# Patient Record
Sex: Female | Born: 1940 | Race: White | Hispanic: No | Marital: Married | Smoking: Former smoker
Health system: Southern US, Community
[De-identification: ages and names within clinical notes are randomized; demographics above are authoritative.]

## PROBLEM LIST (undated history)

## (undated) DIAGNOSIS — I5189 Other ill-defined heart diseases: Secondary | ICD-10-CM

## (undated) DIAGNOSIS — R58 Hemorrhage, not elsewhere classified: Secondary | ICD-10-CM

## (undated) DIAGNOSIS — I2699 Other pulmonary embolism without acute cor pulmonale: Secondary | ICD-10-CM

## (undated) DIAGNOSIS — I513 Intracardiac thrombosis, not elsewhere classified: Secondary | ICD-10-CM

## (undated) DIAGNOSIS — S72002A Fracture of unspecified part of neck of left femur, initial encounter for closed fracture: Secondary | ICD-10-CM

## (undated) DIAGNOSIS — I469 Cardiac arrest, cause unspecified: Secondary | ICD-10-CM

## (undated) DIAGNOSIS — N9489 Other specified conditions associated with female genital organs and menstrual cycle: Secondary | ICD-10-CM

## (undated) DIAGNOSIS — J9601 Acute respiratory failure with hypoxia: Secondary | ICD-10-CM

## (undated) DIAGNOSIS — E870 Hyperosmolality and hypernatremia: Secondary | ICD-10-CM

## (undated) HISTORY — PX: IVC FILTER PLACEMENT (ARMC HX): HXRAD1551

## (undated) HISTORY — PX: BACK SURGERY: SHX140

---

## 2000-06-06 ENCOUNTER — Other Ambulatory Visit: Admission: RE | Admit: 2000-06-06 | Discharge: 2000-06-06 | Payer: Self-pay | Admitting: Gynecology

## 2000-08-31 ENCOUNTER — Encounter: Payer: Self-pay | Admitting: *Deleted

## 2000-08-31 ENCOUNTER — Ambulatory Visit (HOSPITAL_COMMUNITY): Admission: RE | Admit: 2000-08-31 | Discharge: 2000-08-31 | Payer: Self-pay | Admitting: *Deleted

## 2001-03-03 ENCOUNTER — Encounter: Admission: RE | Admit: 2001-03-03 | Discharge: 2001-03-03 | Payer: Self-pay | Admitting: Neurosurgery

## 2001-03-03 ENCOUNTER — Encounter: Payer: Self-pay | Admitting: Neurosurgery

## 2001-03-17 ENCOUNTER — Encounter: Admission: RE | Admit: 2001-03-17 | Discharge: 2001-03-17 | Payer: Self-pay | Admitting: Neurosurgery

## 2001-03-17 ENCOUNTER — Encounter: Payer: Self-pay | Admitting: Neurosurgery

## 2001-03-31 ENCOUNTER — Encounter: Admission: RE | Admit: 2001-03-31 | Discharge: 2001-03-31 | Payer: Self-pay | Admitting: Neurosurgery

## 2001-03-31 ENCOUNTER — Encounter: Payer: Self-pay | Admitting: Neurosurgery

## 2001-05-22 ENCOUNTER — Other Ambulatory Visit: Admission: RE | Admit: 2001-05-22 | Discharge: 2001-05-22 | Payer: Self-pay | Admitting: Gynecology

## 2001-07-14 ENCOUNTER — Encounter: Payer: Self-pay | Admitting: Neurosurgery

## 2001-07-18 ENCOUNTER — Encounter: Payer: Self-pay | Admitting: Neurosurgery

## 2001-07-18 ENCOUNTER — Ambulatory Visit (HOSPITAL_COMMUNITY): Admission: RE | Admit: 2001-07-18 | Discharge: 2001-07-19 | Payer: Self-pay | Admitting: Neurosurgery

## 2001-08-22 ENCOUNTER — Encounter: Payer: Self-pay | Admitting: Neurosurgery

## 2001-08-22 ENCOUNTER — Ambulatory Visit (HOSPITAL_COMMUNITY): Admission: RE | Admit: 2001-08-22 | Discharge: 2001-08-22 | Payer: Self-pay | Admitting: Neurosurgery

## 2001-09-12 ENCOUNTER — Encounter: Admission: RE | Admit: 2001-09-12 | Discharge: 2001-09-12 | Payer: Self-pay | Admitting: Neurosurgery

## 2001-09-12 ENCOUNTER — Encounter: Payer: Self-pay | Admitting: Neurosurgery

## 2001-11-13 ENCOUNTER — Encounter: Admission: RE | Admit: 2001-11-13 | Discharge: 2001-11-13 | Payer: Self-pay | Admitting: Neurosurgery

## 2001-11-13 ENCOUNTER — Encounter: Payer: Self-pay | Admitting: Neurosurgery

## 2001-11-27 ENCOUNTER — Encounter: Payer: Self-pay | Admitting: Neurosurgery

## 2001-11-27 ENCOUNTER — Encounter: Admission: RE | Admit: 2001-11-27 | Discharge: 2001-11-27 | Payer: Self-pay | Admitting: Neurosurgery

## 2002-08-20 ENCOUNTER — Inpatient Hospital Stay (HOSPITAL_COMMUNITY): Admission: EM | Admit: 2002-08-20 | Discharge: 2002-08-28 | Payer: Self-pay | Admitting: Emergency Medicine

## 2002-08-20 ENCOUNTER — Encounter: Payer: Self-pay | Admitting: Emergency Medicine

## 2002-08-21 ENCOUNTER — Encounter: Payer: Self-pay | Admitting: Pulmonary Disease

## 2002-08-22 ENCOUNTER — Encounter: Payer: Self-pay | Admitting: Critical Care Medicine

## 2002-08-24 ENCOUNTER — Encounter: Payer: Self-pay | Admitting: Pulmonary Disease

## 2003-12-12 ENCOUNTER — Encounter: Admission: RE | Admit: 2003-12-12 | Discharge: 2003-12-12 | Payer: Self-pay | Admitting: Interventional Radiology

## 2004-01-09 ENCOUNTER — Encounter: Admission: RE | Admit: 2004-01-09 | Discharge: 2004-01-09 | Payer: Self-pay | Admitting: Interventional Radiology

## 2010-11-11 ENCOUNTER — Other Ambulatory Visit: Payer: Self-pay | Admitting: Neurosurgery

## 2010-11-11 DIAGNOSIS — M545 Low back pain, unspecified: Secondary | ICD-10-CM

## 2010-11-14 ENCOUNTER — Ambulatory Visit
Admission: RE | Admit: 2010-11-14 | Discharge: 2010-11-14 | Disposition: A | Discharge status: Home / Self Care | Payer: Self-pay | Source: Ambulatory Visit | Attending: Neurosurgery | Admitting: Neurosurgery

## 2010-11-14 DIAGNOSIS — M545 Low back pain, unspecified: Secondary | ICD-10-CM

## 2010-11-14 MED ORDER — GADOBENATE DIMEGLUMINE 529 MG/ML IV SOLN
12.0000 mL | Freq: Once | INTRAVENOUS | Status: AC | PRN
  Administered 2010-11-14: 12 mL via INTRAVENOUS

## 2010-11-19 ENCOUNTER — Other Ambulatory Visit: Payer: Self-pay | Admitting: Neurosurgery

## 2010-11-19 DIAGNOSIS — M5126 Other intervertebral disc displacement, lumbar region: Secondary | ICD-10-CM

## 2010-11-19 DIAGNOSIS — M5410 Radiculopathy, site unspecified: Secondary | ICD-10-CM

## 2010-11-20 ENCOUNTER — Ambulatory Visit
Admission: RE | Admit: 2010-11-20 | Discharge: 2010-11-20 | Disposition: A | Discharge status: Home / Self Care | Payer: 59 | Source: Ambulatory Visit | Attending: Neurosurgery | Admitting: Neurosurgery

## 2010-11-20 ENCOUNTER — Other Ambulatory Visit: Payer: Self-pay | Admitting: Neurosurgery

## 2010-11-20 DIAGNOSIS — M5410 Radiculopathy, site unspecified: Secondary | ICD-10-CM

## 2010-11-20 DIAGNOSIS — M5126 Other intervertebral disc displacement, lumbar region: Secondary | ICD-10-CM

## 2010-12-31 ENCOUNTER — Other Ambulatory Visit: Payer: Self-pay | Admitting: Neurosurgery

## 2010-12-31 DIAGNOSIS — M549 Dorsalgia, unspecified: Secondary | ICD-10-CM

## 2010-12-31 DIAGNOSIS — M5410 Radiculopathy, site unspecified: Secondary | ICD-10-CM

## 2011-01-01 ENCOUNTER — Ambulatory Visit
Admission: RE | Admit: 2011-01-01 | Discharge: 2011-01-01 | Disposition: A | Discharge status: Home / Self Care | Payer: 59 | Source: Ambulatory Visit | Attending: Neurosurgery | Admitting: Neurosurgery

## 2011-01-01 DIAGNOSIS — M5410 Radiculopathy, site unspecified: Secondary | ICD-10-CM

## 2011-01-01 DIAGNOSIS — M549 Dorsalgia, unspecified: Secondary | ICD-10-CM

## 2011-11-08 ENCOUNTER — Encounter (INDEPENDENT_AMBULATORY_CARE_PROVIDER_SITE_OTHER): Payer: PRIVATE HEALTH INSURANCE | Admitting: Ophthalmology

## 2011-11-08 DIAGNOSIS — H2510 Age-related nuclear cataract, unspecified eye: Secondary | ICD-10-CM

## 2011-11-08 DIAGNOSIS — H40019 Open angle with borderline findings, low risk, unspecified eye: Secondary | ICD-10-CM

## 2011-11-08 DIAGNOSIS — H43819 Vitreous degeneration, unspecified eye: Secondary | ICD-10-CM

## 2012-11-08 ENCOUNTER — Ambulatory Visit (INDEPENDENT_AMBULATORY_CARE_PROVIDER_SITE_OTHER): Payer: PRIVATE HEALTH INSURANCE | Admitting: Ophthalmology

## 2014-04-16 ENCOUNTER — Other Ambulatory Visit: Payer: Self-pay | Admitting: Family Medicine

## 2014-04-16 DIAGNOSIS — M545 Low back pain, unspecified: Secondary | ICD-10-CM

## 2014-04-16 DIAGNOSIS — R2689 Other abnormalities of gait and mobility: Secondary | ICD-10-CM

## 2014-04-17 ENCOUNTER — Ambulatory Visit
Admission: RE | Admit: 2014-04-17 | Discharge: 2014-04-17 | Disposition: A | Discharge status: Home / Self Care | Payer: Medicare HMO | Source: Ambulatory Visit | Attending: Family Medicine | Admitting: Family Medicine

## 2014-04-17 DIAGNOSIS — R2689 Other abnormalities of gait and mobility: Secondary | ICD-10-CM

## 2014-04-17 DIAGNOSIS — M545 Low back pain, unspecified: Secondary | ICD-10-CM

## 2014-05-13 ENCOUNTER — Other Ambulatory Visit: Payer: Self-pay | Admitting: Neurosurgery

## 2014-05-13 DIAGNOSIS — M5416 Radiculopathy, lumbar region: Secondary | ICD-10-CM

## 2014-05-31 ENCOUNTER — Ambulatory Visit
Admission: RE | Admit: 2014-05-31 | Discharge: 2014-05-31 | Disposition: A | Discharge status: Home / Self Care | Payer: Medicare HMO | Source: Ambulatory Visit | Attending: Neurosurgery | Admitting: Neurosurgery

## 2014-05-31 DIAGNOSIS — M5416 Radiculopathy, lumbar region: Secondary | ICD-10-CM

## 2014-05-31 MED ORDER — DIAZEPAM 5 MG PO TABS
5.0000 mg | ORAL_TABLET | Freq: Once | ORAL | Status: AC
  Administered 2014-05-31: 5 mg via ORAL

## 2014-05-31 MED ORDER — IOHEXOL 180 MG/ML  SOLN
18.0000 mL | Freq: Once | INTRAMUSCULAR | Status: AC | PRN
  Administered 2014-05-31: 18 mL via INTRATHECAL

## 2014-05-31 NOTE — Discharge Instructions (Signed)

## 2015-08-18 DIAGNOSIS — H04123 Dry eye syndrome of bilateral lacrimal glands: Secondary | ICD-10-CM | POA: Diagnosis not present

## 2015-08-18 DIAGNOSIS — H25013 Cortical age-related cataract, bilateral: Secondary | ICD-10-CM | POA: Diagnosis not present

## 2015-08-18 DIAGNOSIS — H2513 Age-related nuclear cataract, bilateral: Secondary | ICD-10-CM | POA: Diagnosis not present

## 2015-10-21 DIAGNOSIS — H04123 Dry eye syndrome of bilateral lacrimal glands: Secondary | ICD-10-CM | POA: Diagnosis not present

## 2015-10-21 DIAGNOSIS — H2513 Age-related nuclear cataract, bilateral: Secondary | ICD-10-CM | POA: Diagnosis not present

## 2015-10-22 DIAGNOSIS — L821 Other seborrheic keratosis: Secondary | ICD-10-CM | POA: Diagnosis not present

## 2015-10-22 DIAGNOSIS — Z411 Encounter for cosmetic surgery: Secondary | ICD-10-CM | POA: Diagnosis not present

## 2015-10-22 DIAGNOSIS — L820 Inflamed seborrheic keratosis: Secondary | ICD-10-CM | POA: Diagnosis not present

## 2015-10-22 DIAGNOSIS — I781 Nevus, non-neoplastic: Secondary | ICD-10-CM | POA: Diagnosis not present

## 2015-10-22 DIAGNOSIS — L570 Actinic keratosis: Secondary | ICD-10-CM | POA: Diagnosis not present

## 2015-10-22 DIAGNOSIS — D485 Neoplasm of uncertain behavior of skin: Secondary | ICD-10-CM | POA: Diagnosis not present

## 2015-10-22 DIAGNOSIS — Z85828 Personal history of other malignant neoplasm of skin: Secondary | ICD-10-CM | POA: Diagnosis not present

## 2015-10-22 DIAGNOSIS — L723 Sebaceous cyst: Secondary | ICD-10-CM | POA: Diagnosis not present

## 2015-10-22 DIAGNOSIS — D225 Melanocytic nevi of trunk: Secondary | ICD-10-CM | POA: Diagnosis not present

## 2015-11-01 DIAGNOSIS — J069 Acute upper respiratory infection, unspecified: Secondary | ICD-10-CM | POA: Diagnosis not present

## 2015-12-11 DIAGNOSIS — I469 Cardiac arrest, cause unspecified: Secondary | ICD-10-CM

## 2015-12-11 HISTORY — DX: Cardiac arrest, cause unspecified: I46.9

## 2015-12-25 ENCOUNTER — Emergency Department (HOSPITAL_COMMUNITY): Payer: Medicare HMO

## 2015-12-25 ENCOUNTER — Encounter (HOSPITAL_COMMUNITY): Payer: Self-pay | Admitting: Emergency Medicine

## 2015-12-25 ENCOUNTER — Inpatient Hospital Stay (HOSPITAL_COMMUNITY)
Admission: EM | Admit: 2015-12-25 | Discharge: 2016-01-16 | DRG: 469 | Disposition: A | Discharge status: Rehabilitation Facility | Payer: Medicare HMO | Attending: Family Medicine | Admitting: Family Medicine

## 2015-12-25 DIAGNOSIS — S72009A Fracture of unspecified part of neck of unspecified femur, initial encounter for closed fracture: Secondary | ICD-10-CM | POA: Diagnosis present

## 2015-12-25 DIAGNOSIS — T148 Other injury of unspecified body region: Secondary | ICD-10-CM | POA: Diagnosis not present

## 2015-12-25 DIAGNOSIS — I469 Cardiac arrest, cause unspecified: Secondary | ICD-10-CM | POA: Diagnosis present

## 2015-12-25 DIAGNOSIS — Z95828 Presence of other vascular implants and grafts: Secondary | ICD-10-CM | POA: Diagnosis not present

## 2015-12-25 DIAGNOSIS — W19XXXA Unspecified fall, initial encounter: Secondary | ICD-10-CM | POA: Diagnosis not present

## 2015-12-25 DIAGNOSIS — R9341 Abnormal radiologic findings on diagnostic imaging of renal pelvis, ureter, or bladder: Secondary | ICD-10-CM | POA: Diagnosis not present

## 2015-12-25 DIAGNOSIS — Z823 Family history of stroke: Secondary | ICD-10-CM

## 2015-12-25 DIAGNOSIS — I248 Other forms of acute ischemic heart disease: Secondary | ICD-10-CM | POA: Diagnosis not present

## 2015-12-25 DIAGNOSIS — Z978 Presence of other specified devices: Secondary | ICD-10-CM

## 2015-12-25 DIAGNOSIS — N390 Urinary tract infection, site not specified: Secondary | ICD-10-CM | POA: Diagnosis not present

## 2015-12-25 DIAGNOSIS — M545 Low back pain: Secondary | ICD-10-CM | POA: Diagnosis not present

## 2015-12-25 DIAGNOSIS — I2699 Other pulmonary embolism without acute cor pulmonale: Secondary | ICD-10-CM | POA: Diagnosis not present

## 2015-12-25 DIAGNOSIS — J9600 Acute respiratory failure, unspecified whether with hypoxia or hypercapnia: Secondary | ICD-10-CM | POA: Diagnosis present

## 2015-12-25 DIAGNOSIS — K921 Melena: Secondary | ICD-10-CM | POA: Diagnosis not present

## 2015-12-25 DIAGNOSIS — Z9289 Personal history of other medical treatment: Secondary | ICD-10-CM

## 2015-12-25 DIAGNOSIS — S72092A Other fracture of head and neck of left femur, initial encounter for closed fracture: Secondary | ICD-10-CM | POA: Diagnosis not present

## 2015-12-25 DIAGNOSIS — R52 Pain, unspecified: Secondary | ICD-10-CM | POA: Diagnosis not present

## 2015-12-25 DIAGNOSIS — R222 Localized swelling, mass and lump, trunk: Secondary | ICD-10-CM | POA: Diagnosis not present

## 2015-12-25 DIAGNOSIS — E870 Hyperosmolality and hypernatremia: Secondary | ICD-10-CM | POA: Diagnosis not present

## 2015-12-25 DIAGNOSIS — I97631 Postprocedural hematoma of a circulatory system organ or structure following cardiac bypass: Secondary | ICD-10-CM | POA: Diagnosis not present

## 2015-12-25 DIAGNOSIS — Y92009 Unspecified place in unspecified non-institutional (private) residence as the place of occurrence of the external cause: Secondary | ICD-10-CM

## 2015-12-25 DIAGNOSIS — I745 Embolism and thrombosis of iliac artery: Secondary | ICD-10-CM | POA: Diagnosis not present

## 2015-12-25 DIAGNOSIS — K7200 Acute and subacute hepatic failure without coma: Secondary | ICD-10-CM | POA: Diagnosis not present

## 2015-12-25 DIAGNOSIS — R918 Other nonspecific abnormal finding of lung field: Secondary | ICD-10-CM | POA: Diagnosis not present

## 2015-12-25 DIAGNOSIS — J9601 Acute respiratory failure with hypoxia: Secondary | ICD-10-CM | POA: Diagnosis not present

## 2015-12-25 DIAGNOSIS — I724 Aneurysm of artery of lower extremity: Secondary | ICD-10-CM | POA: Diagnosis not present

## 2015-12-25 DIAGNOSIS — R140 Abdominal distension (gaseous): Secondary | ICD-10-CM | POA: Diagnosis not present

## 2015-12-25 DIAGNOSIS — I9581 Postprocedural hypotension: Secondary | ICD-10-CM | POA: Diagnosis not present

## 2015-12-25 DIAGNOSIS — I82419 Acute embolism and thrombosis of unspecified femoral vein: Secondary | ICD-10-CM | POA: Diagnosis not present

## 2015-12-25 DIAGNOSIS — D329 Benign neoplasm of meninges, unspecified: Secondary | ICD-10-CM | POA: Diagnosis not present

## 2015-12-25 DIAGNOSIS — W010XXA Fall on same level from slipping, tripping and stumbling without subsequent striking against object, initial encounter: Secondary | ICD-10-CM | POA: Diagnosis not present

## 2015-12-25 DIAGNOSIS — Z87891 Personal history of nicotine dependence: Secondary | ICD-10-CM

## 2015-12-25 DIAGNOSIS — I471 Supraventricular tachycardia: Secondary | ICD-10-CM | POA: Diagnosis not present

## 2015-12-25 DIAGNOSIS — N9489 Other specified conditions associated with female genital organs and menstrual cycle: Secondary | ICD-10-CM

## 2015-12-25 DIAGNOSIS — I8290 Acute embolism and thrombosis of unspecified vein: Secondary | ICD-10-CM

## 2015-12-25 DIAGNOSIS — I9762 Postprocedural hemorrhage of a circulatory system organ or structure following other procedure: Secondary | ICD-10-CM | POA: Diagnosis not present

## 2015-12-25 DIAGNOSIS — I340 Nonrheumatic mitral (valve) insufficiency: Secondary | ICD-10-CM | POA: Diagnosis not present

## 2015-12-25 DIAGNOSIS — Z8674 Personal history of sudden cardiac arrest: Secondary | ICD-10-CM | POA: Diagnosis not present

## 2015-12-25 DIAGNOSIS — R1900 Intra-abdominal and pelvic swelling, mass and lump, unspecified site: Secondary | ICD-10-CM | POA: Diagnosis not present

## 2015-12-25 DIAGNOSIS — Z96642 Presence of left artificial hip joint: Secondary | ICD-10-CM | POA: Diagnosis not present

## 2015-12-25 DIAGNOSIS — R579 Shock, unspecified: Secondary | ICD-10-CM | POA: Diagnosis not present

## 2015-12-25 DIAGNOSIS — I82413 Acute embolism and thrombosis of femoral vein, bilateral: Secondary | ICD-10-CM | POA: Diagnosis not present

## 2015-12-25 DIAGNOSIS — J9690 Respiratory failure, unspecified, unspecified whether with hypoxia or hypercapnia: Secondary | ICD-10-CM | POA: Diagnosis not present

## 2015-12-25 DIAGNOSIS — S72012A Unspecified intracapsular fracture of left femur, initial encounter for closed fracture: Principal | ICD-10-CM | POA: Diagnosis present

## 2015-12-25 DIAGNOSIS — Y793 Surgical instruments, materials and orthopedic devices (including sutures) associated with adverse incidents: Secondary | ICD-10-CM | POA: Diagnosis not present

## 2015-12-25 DIAGNOSIS — T781XXA Other adverse food reactions, not elsewhere classified, initial encounter: Secondary | ICD-10-CM | POA: Diagnosis not present

## 2015-12-25 DIAGNOSIS — R1312 Dysphagia, oropharyngeal phase: Secondary | ICD-10-CM | POA: Diagnosis not present

## 2015-12-25 DIAGNOSIS — R601 Generalized edema: Secondary | ICD-10-CM | POA: Diagnosis not present

## 2015-12-25 DIAGNOSIS — R578 Other shock: Secondary | ICD-10-CM | POA: Diagnosis present

## 2015-12-25 DIAGNOSIS — Z6820 Body mass index (BMI) 20.0-20.9, adult: Secondary | ICD-10-CM

## 2015-12-25 DIAGNOSIS — R5381 Other malaise: Secondary | ICD-10-CM | POA: Diagnosis not present

## 2015-12-25 DIAGNOSIS — I2692 Saddle embolus of pulmonary artery without acute cor pulmonale: Secondary | ICD-10-CM | POA: Diagnosis not present

## 2015-12-25 DIAGNOSIS — I462 Cardiac arrest due to underlying cardiac condition: Secondary | ICD-10-CM | POA: Diagnosis not present

## 2015-12-25 DIAGNOSIS — Y92234 Operating room of hospital as the place of occurrence of the external cause: Secondary | ICD-10-CM | POA: Diagnosis not present

## 2015-12-25 DIAGNOSIS — K922 Gastrointestinal hemorrhage, unspecified: Secondary | ICD-10-CM | POA: Diagnosis not present

## 2015-12-25 DIAGNOSIS — I723 Aneurysm of iliac artery: Secondary | ICD-10-CM | POA: Diagnosis present

## 2015-12-25 DIAGNOSIS — Z471 Aftercare following joint replacement surgery: Secondary | ICD-10-CM | POA: Diagnosis not present

## 2015-12-25 DIAGNOSIS — S72002A Fracture of unspecified part of neck of left femur, initial encounter for closed fracture: Secondary | ICD-10-CM | POA: Diagnosis not present

## 2015-12-25 DIAGNOSIS — D696 Thrombocytopenia, unspecified: Secondary | ICD-10-CM | POA: Diagnosis present

## 2015-12-25 DIAGNOSIS — I480 Paroxysmal atrial fibrillation: Secondary | ICD-10-CM | POA: Diagnosis not present

## 2015-12-25 DIAGNOSIS — I9752 Accidental puncture and laceration of a circulatory system organ or structure during other procedure: Secondary | ICD-10-CM | POA: Diagnosis not present

## 2015-12-25 DIAGNOSIS — D62 Acute posthemorrhagic anemia: Secondary | ICD-10-CM | POA: Diagnosis not present

## 2015-12-25 DIAGNOSIS — I341 Nonrheumatic mitral (valve) prolapse: Secondary | ICD-10-CM | POA: Diagnosis not present

## 2015-12-25 DIAGNOSIS — I493 Ventricular premature depolarization: Secondary | ICD-10-CM | POA: Diagnosis not present

## 2015-12-25 DIAGNOSIS — Y658 Other specified misadventures during surgical and medical care: Secondary | ICD-10-CM | POA: Diagnosis not present

## 2015-12-25 DIAGNOSIS — T888XXA Other specified complications of surgical and medical care, not elsewhere classified, initial encounter: Secondary | ICD-10-CM

## 2015-12-25 DIAGNOSIS — I5189 Other ill-defined heart diseases: Secondary | ICD-10-CM | POA: Diagnosis present

## 2015-12-25 DIAGNOSIS — R269 Unspecified abnormalities of gait and mobility: Secondary | ICD-10-CM | POA: Diagnosis not present

## 2015-12-25 DIAGNOSIS — J811 Chronic pulmonary edema: Secondary | ICD-10-CM | POA: Diagnosis not present

## 2015-12-25 DIAGNOSIS — R609 Edema, unspecified: Secondary | ICD-10-CM | POA: Diagnosis not present

## 2015-12-25 DIAGNOSIS — R4701 Aphasia: Secondary | ICD-10-CM | POA: Diagnosis not present

## 2015-12-25 DIAGNOSIS — R402134 Coma scale, eyes open, to sound, 24 hours or more after hospital admission: Secondary | ICD-10-CM | POA: Diagnosis not present

## 2015-12-25 DIAGNOSIS — K661 Hemoperitoneum: Secondary | ICD-10-CM | POA: Diagnosis not present

## 2015-12-25 DIAGNOSIS — R1310 Dysphagia, unspecified: Secondary | ICD-10-CM | POA: Diagnosis not present

## 2015-12-25 DIAGNOSIS — Z807 Family history of other malignant neoplasms of lymphoid, hematopoietic and related tissues: Secondary | ICD-10-CM

## 2015-12-25 DIAGNOSIS — K625 Hemorrhage of anus and rectum: Secondary | ICD-10-CM | POA: Diagnosis present

## 2015-12-25 DIAGNOSIS — S79912A Unspecified injury of left hip, initial encounter: Secondary | ICD-10-CM | POA: Diagnosis not present

## 2015-12-25 DIAGNOSIS — R571 Hypovolemic shock: Secondary | ICD-10-CM | POA: Diagnosis present

## 2015-12-25 DIAGNOSIS — J9811 Atelectasis: Secondary | ICD-10-CM | POA: Diagnosis not present

## 2015-12-25 DIAGNOSIS — G8918 Other acute postprocedural pain: Secondary | ICD-10-CM | POA: Diagnosis not present

## 2015-12-25 DIAGNOSIS — I213 ST elevation (STEMI) myocardial infarction of unspecified site: Secondary | ICD-10-CM | POA: Diagnosis not present

## 2015-12-25 DIAGNOSIS — Z8249 Family history of ischemic heart disease and other diseases of the circulatory system: Secondary | ICD-10-CM

## 2015-12-25 DIAGNOSIS — E872 Acidosis: Secondary | ICD-10-CM | POA: Diagnosis not present

## 2015-12-25 DIAGNOSIS — I2601 Septic pulmonary embolism with acute cor pulmonale: Secondary | ICD-10-CM | POA: Diagnosis not present

## 2015-12-25 DIAGNOSIS — T81718A Complication of other artery following a procedure, not elsewhere classified, initial encounter: Secondary | ICD-10-CM | POA: Diagnosis not present

## 2015-12-25 DIAGNOSIS — E46 Unspecified protein-calorie malnutrition: Secondary | ICD-10-CM | POA: Diagnosis present

## 2015-12-25 DIAGNOSIS — I82403 Acute embolism and thrombosis of unspecified deep veins of lower extremity, bilateral: Secondary | ICD-10-CM | POA: Diagnosis not present

## 2015-12-25 DIAGNOSIS — R58 Hemorrhage, not elsewhere classified: Secondary | ICD-10-CM

## 2015-12-25 DIAGNOSIS — R402354 Coma scale, best motor response, localizes pain, 24 hours or more after hospital admission: Secondary | ICD-10-CM | POA: Diagnosis not present

## 2015-12-25 DIAGNOSIS — R69 Illness, unspecified: Secondary | ICD-10-CM | POA: Diagnosis not present

## 2015-12-25 DIAGNOSIS — I513 Intracardiac thrombosis, not elsewhere classified: Secondary | ICD-10-CM | POA: Diagnosis present

## 2015-12-25 DIAGNOSIS — Z419 Encounter for procedure for purposes other than remedying health state, unspecified: Secondary | ICD-10-CM

## 2015-12-25 DIAGNOSIS — S72002S Fracture of unspecified part of neck of left femur, sequela: Secondary | ICD-10-CM | POA: Diagnosis not present

## 2015-12-25 DIAGNOSIS — Y92099 Unspecified place in other non-institutional residence as the place of occurrence of the external cause: Secondary | ICD-10-CM | POA: Diagnosis not present

## 2015-12-25 DIAGNOSIS — I871 Compression of vein: Secondary | ICD-10-CM | POA: Diagnosis not present

## 2015-12-25 DIAGNOSIS — Z96641 Presence of right artificial hip joint: Secondary | ICD-10-CM | POA: Diagnosis not present

## 2015-12-25 DIAGNOSIS — R402234 Coma scale, best verbal response, inappropriate words, 24 hours or more after hospital admission: Secondary | ICD-10-CM | POA: Diagnosis not present

## 2015-12-25 DIAGNOSIS — S3792XA Contusion of unspecified urinary and pelvic organ, initial encounter: Secondary | ICD-10-CM | POA: Diagnosis not present

## 2015-12-25 DIAGNOSIS — E860 Dehydration: Secondary | ICD-10-CM | POA: Diagnosis not present

## 2015-12-25 DIAGNOSIS — M25552 Pain in left hip: Secondary | ICD-10-CM | POA: Diagnosis not present

## 2015-12-25 DIAGNOSIS — R197 Diarrhea, unspecified: Secondary | ICD-10-CM | POA: Diagnosis not present

## 2015-12-25 DIAGNOSIS — F329 Major depressive disorder, single episode, unspecified: Secondary | ICD-10-CM | POA: Diagnosis not present

## 2015-12-25 DIAGNOSIS — Z4682 Encounter for fitting and adjustment of non-vascular catheter: Secondary | ICD-10-CM | POA: Diagnosis not present

## 2015-12-25 DIAGNOSIS — E876 Hypokalemia: Secondary | ICD-10-CM | POA: Diagnosis present

## 2015-12-25 DIAGNOSIS — F419 Anxiety disorder, unspecified: Secondary | ICD-10-CM | POA: Diagnosis not present

## 2015-12-25 DIAGNOSIS — R570 Cardiogenic shock: Secondary | ICD-10-CM | POA: Diagnosis not present

## 2015-12-25 DIAGNOSIS — R339 Retention of urine, unspecified: Secondary | ICD-10-CM | POA: Diagnosis not present

## 2015-12-25 DIAGNOSIS — D72829 Elevated white blood cell count, unspecified: Secondary | ICD-10-CM | POA: Diagnosis present

## 2015-12-25 DIAGNOSIS — Z4659 Encounter for fitting and adjustment of other gastrointestinal appliance and device: Secondary | ICD-10-CM

## 2015-12-25 DIAGNOSIS — G9340 Encephalopathy, unspecified: Secondary | ICD-10-CM | POA: Diagnosis not present

## 2015-12-25 DIAGNOSIS — Z8781 Personal history of (healed) traumatic fracture: Secondary | ICD-10-CM | POA: Diagnosis not present

## 2015-12-25 DIAGNOSIS — Z86711 Personal history of pulmonary embolism: Secondary | ICD-10-CM | POA: Diagnosis not present

## 2015-12-25 DIAGNOSIS — R0602 Shortness of breath: Secondary | ICD-10-CM | POA: Diagnosis not present

## 2015-12-25 DIAGNOSIS — J90 Pleural effusion, not elsewhere classified: Secondary | ICD-10-CM | POA: Diagnosis not present

## 2015-12-25 DIAGNOSIS — I2609 Other pulmonary embolism with acute cor pulmonale: Secondary | ICD-10-CM | POA: Diagnosis not present

## 2015-12-25 HISTORY — DX: Other ill-defined heart diseases: I51.89

## 2015-12-25 HISTORY — DX: Other specified conditions associated with female genital organs and menstrual cycle: N94.89

## 2015-12-25 HISTORY — DX: Acute respiratory failure with hypoxia: J96.01

## 2015-12-25 HISTORY — DX: Other pulmonary embolism without acute cor pulmonale: I26.99

## 2015-12-25 HISTORY — DX: Intracardiac thrombosis, not elsewhere classified: I51.3

## 2015-12-25 HISTORY — DX: Hemorrhage, not elsewhere classified: R58

## 2015-12-25 HISTORY — DX: Cardiac arrest, cause unspecified: I46.9

## 2015-12-25 HISTORY — DX: Fracture of unspecified part of neck of left femur, initial encounter for closed fracture: S72.002A

## 2015-12-25 HISTORY — DX: Hyperosmolality and hypernatremia: E87.0

## 2015-12-25 LAB — CBC WITH DIFFERENTIAL/PLATELET
BASOS PCT: 0 %
Basophils Absolute: 0 10*3/uL (ref 0.0–0.1)
EOS ABS: 0 10*3/uL (ref 0.0–0.7)
Eosinophils Relative: 0 %
HCT: 35.3 % — ABNORMAL LOW (ref 36.0–46.0)
HEMOGLOBIN: 12 g/dL (ref 12.0–15.0)
Lymphocytes Relative: 9 %
Lymphs Abs: 1.1 10*3/uL (ref 0.7–4.0)
MCH: 28.9 pg (ref 26.0–34.0)
MCHC: 34 g/dL (ref 30.0–36.0)
MCV: 85.1 fL (ref 78.0–100.0)
Monocytes Absolute: 0.7 10*3/uL (ref 0.1–1.0)
Monocytes Relative: 6 %
NEUTROS PCT: 85 %
Neutro Abs: 10.8 10*3/uL — ABNORMAL HIGH (ref 1.7–7.7)
Platelets: 246 10*3/uL (ref 150–400)
RBC: 4.15 MIL/uL (ref 3.87–5.11)
RDW: 13.1 % (ref 11.5–15.5)
WBC: 12.6 10*3/uL — AB (ref 4.0–10.5)

## 2015-12-25 LAB — BASIC METABOLIC PANEL
ANION GAP: 8 (ref 5–15)
BUN: 21 mg/dL — ABNORMAL HIGH (ref 6–20)
CALCIUM: 9.2 mg/dL (ref 8.9–10.3)
CO2: 23 mmol/L (ref 22–32)
CREATININE: 0.76 mg/dL (ref 0.44–1.00)
Chloride: 101 mmol/L (ref 101–111)
GFR calc Af Amer: >60 mL/min (ref >60)
GFR calc non Af Amer: >60 mL/min (ref >60)
GLUCOSE: 143 mg/dL — AB (ref 65–99)
POTASSIUM: 4.3 mmol/L (ref 3.5–5.1)
Sodium: 132 mmol/L — ABNORMAL LOW (ref 135–145)

## 2015-12-25 LAB — URINALYSIS, ROUTINE W REFLEX MICROSCOPIC
Bilirubin Urine: NEGATIVE
GLUCOSE, UA: NEGATIVE mg/dL
Ketones, ur: >80 mg/dL — AB
Nitrite: NEGATIVE
PH: 6.5 (ref 5.0–8.0)
PROTEIN: NEGATIVE mg/dL
Specific Gravity, Urine: 1.016 (ref 1.005–1.030)

## 2015-12-25 LAB — ABO/RH: ABO/RH(D): B POS

## 2015-12-25 LAB — URINE MICROSCOPIC-ADD ON

## 2015-12-25 LAB — TYPE AND SCREEN
ABO/RH(D): B POS
ANTIBODY SCREEN: NEGATIVE

## 2015-12-25 LAB — PROTIME-INR
INR: 1.13 (ref 0.00–1.49)
PROTHROMBIN TIME: 14.3 s (ref 11.6–15.2)

## 2015-12-25 MED ORDER — KETOROLAC TROMETHAMINE 30 MG/ML IJ SOLN
30.0000 mg | Freq: Once | INTRAMUSCULAR | Status: AC
  Administered 2015-12-25: 30 mg via INTRAVENOUS
  Filled 2015-12-25: qty 1

## 2015-12-25 MED ORDER — MORPHINE SULFATE (PF) 4 MG/ML IV SOLN
4.0000 mg | Freq: Once | INTRAVENOUS | Status: AC
  Administered 2015-12-25: 4 mg via INTRAVENOUS
  Filled 2015-12-25: qty 1

## 2015-12-25 MED ORDER — HYDROMORPHONE HCL 1 MG/ML IJ SOLN
1.0000 mg | Freq: Once | INTRAMUSCULAR | Status: AC
  Administered 2015-12-25: 1 mg via INTRAVENOUS
  Filled 2015-12-25: qty 1

## 2015-12-25 NOTE — ED Notes (Signed)
Per EMS pt fell at home after tripping over a open drawer  Pt landed on her left hip  Pt went to her PCP and had imaging done that showed a femoral hip fx  Pt denies hitting her head, no LOC, and pt is not on any blood thinners  Pt denies any med problems, medications, or allergies

## 2015-12-25 NOTE — ED Provider Notes (Signed)
CSN: [id number]     Arrival date & time 12/25/15  1913 History   First MD Initiated Contact with Patient 12/25/15 1923     Chief Complaint  Patient presents with  . Hip Injury   PT IS A 75 YO WF WHO SAID THAT SHE FELL AT HOME AND LANDED ON HER LEFT HIP.  SHE SAID SHE TRIPPED OVER AN OPEN DRAWER.  SHE INITIALLY WENT TO URGENT CARE WHO DID XRAYS AND TOLD HER THAT SHE HAD A HIP FRACTURE.  THEY CALLED EMS AND SENT HER HERE.  PT DENIES ANY OTHER INJURY.  (Consider location/radiation/quality/duration/timing/severity/associated sxs/prior Treatment) The history is provided by the patient.    History reviewed. No pertinent past medical history. Past Surgical History  Procedure Laterality Date  . Back surgery     History reviewed. No pertinent family history. Social History  Substance Use Topics  . Smoking status: Former Research scientist (life sciences)  . Smokeless tobacco: Former Systems developer    Quit date: 12/11/1995  . Alcohol Use: No   OB History    No data available     Review of Systems  Musculoskeletal:       LEFT HIP PAIN  All other systems reviewed and are negative.     Allergies  Review of patient's allergies indicates no known allergies.  Home Medications   Prior to Admission medications   Medication Sig Start Date End Date Taking? Authorizing Provider  CALCIUM PO Take 2 tablets by mouth daily.   Yes Historical Provider, MD  chlorhexidine (PERIDEX) 0.12 % solution 15 mLs by Mouth Rinse route 2 (two) times daily. 12/22/15  Yes Historical Provider, MD  Cholecalciferol (VITAMIN D PO) Take 1 capsule by mouth daily.   Yes Historical Provider, MD  fluticasone (FLONASE) 50 MCG/ACT nasal spray Place 2 sprays into the nose daily as needed for allergies.  11/01/15  Yes Historical Provider, MD  GINKGO BILOBA PO Take 1 capsule by mouth daily.   Yes Historical Provider, MD  Multiple Vitamin (MULTIVITAMIN WITH MINERALS) TABS tablet Take 1 tablet by mouth daily.   Yes Historical Provider, MD  Multiple  Vitamins-Minerals (ICAPS AREDS 2) CAPS Take 1 capsule by mouth 2 (two) times daily.   Yes Historical Provider, MD   BP 145/98 mmHg  Pulse 94  Temp(Src) 97.8 F (36.6 C) (Oral)  Resp 19  SpO2 95% Physical Exam  Constitutional: She is oriented to person, place, and time. She appears well-developed and well-nourished.  HENT:  Head: Normocephalic and atraumatic.  Right Ear: External ear normal.  Left Ear: External ear normal.  Nose: Nose normal.  Mouth/Throat: Oropharynx is clear and moist.  Eyes: Conjunctivae and EOM are normal. Pupils are equal, round, and reactive to light.  Neck: Normal range of motion. Neck supple.  Cardiovascular: Normal rate, regular rhythm, normal heart sounds and intact distal pulses.   Pulmonary/Chest: Effort normal and breath sounds normal.  Abdominal: Soft. Bowel sounds are normal.  Musculoskeletal:       Left hip: She exhibits tenderness and deformity.  Neurological: She is alert and oriented to person, place, and time.  Skin: Skin is warm and dry.  Psychiatric: She has a normal mood and affect. Her behavior is normal. Judgment and thought content normal.  Nursing note and vitals reviewed.   ED Course  Procedures (including critical care time) Labs Review Labs Reviewed  BASIC METABOLIC PANEL - Abnormal; Notable for the following:    Sodium 132 (*)    Glucose, Bld 143 (*)    BUN  21 (*)    All other components within normal limits  CBC WITH DIFFERENTIAL/PLATELET - Abnormal; Notable for the following:    WBC 12.6 (*)    HCT 35.3 (*)    Neutro Abs 10.8 (*)    All other components within normal limits  URINALYSIS, ROUTINE W REFLEX MICROSCOPIC (NOT AT [facility]) - Abnormal; Notable for the following:    APPearance CLOUDY (*)    Hgb urine dipstick SMALL (*)    Ketones, ur >80 (*)    Leukocytes, UA SMALL (*)    All other components within normal limits  URINE MICROSCOPIC-ADD ON - Abnormal; Notable for the following:    Squamous Epithelial / LPF 6-30 (*)     Bacteria, UA MANY (*)    All other components within normal limits  PROTIME-INR  TYPE AND SCREEN  ABO/RH    Imaging Review Dg Chest 1 View  12/25/2015  CLINICAL DATA:  Recent fall today with left hip pain, initial encounter EXAM: CHEST 1 VIEW COMPARISON:  None. FINDINGS: Cardiac shadow is within normal limits. The lungs are well aerated bilaterally. Mild interstitial changes are seen without focal infiltrate. No acute bony abnormality is seen. IMPRESSION: Mild interstitial changes likely of a chronic nature. No acute abnormality seen. Electronically Signed   By: Inez Catalina M.D.   On: 12/25/2015 21:17   Dg Lumbar Spine Complete  12/25/2015  CLINICAL DATA:  Fall today with low back pain, initial encounter EXAM: LUMBAR SPINE - COMPLETE 4+ VIEW COMPARISON:  None. FINDINGS: Five lumbar type vertebral bodies are well visualized. A mild scoliosis concave to the right is noted. Reactive endplate changes are noted. No anterolisthesis is seen. No soft tissue abnormality is noted. IMPRESSION: Degenerative change without acute abnormality. Electronically Signed   By: Inez Catalina M.D.   On: 12/25/2015 21:17   Dg Hip Unilat With Pelvis 2-3 Views Left  12/25/2015  CLINICAL DATA:  Recent fall with hip pain, initial encounter EXAM: DG HIP (WITH OR WITHOUT PELVIS) 2-3V LEFT COMPARISON:  None. FINDINGS: There is left femoral neck fracture with impaction and angulation at the fracture site. Pelvic ring is intact. No other focal abnormality is seen. IMPRESSION: Left femoral neck fracture. Electronically Signed   By: Inez Catalina M.D.   On: 12/25/2015 21:16   Dg Femur 1v Left  12/25/2015  CLINICAL DATA:  [place] [address] with left hip pain. EXAM: LEFT FEMUR 1 VIEW COMPARISON:  Left hip 12/25/2015 FINDINGS: Single view of the left femur was obtained. There is a fracture involving the proximal left femur at the junction of the femoral head and neck. There is superior displacement of the left femoral neck. Findings are  suggestive for a subcapital hip fracture. The mid and distal femur appear to be intact on this single view. IMPRESSION: Fracture of the proximal left femur. Findings are compatible with a subcapital hip fracture. Electronically Signed   By: Markus Daft M.D.   On: 12/25/2015 21:17   I have personally reviewed and evaluated these images and lab results as part of my medical decision-making.   EKG Interpretation None      MDM  PT D/W DR. Percell Miller (ORTHO).  HE REQUESTED THAT PT BE TRANSFERRED TO [place].  THE PT D/W DR. Abner Greenspan (HOSPITALIST) WHO WILL DO HER ADMISSION.  DR. Blaine Hamper ([facility]) WILL BE THE ACCEPTING DR AT [facility].  Final diagnoses:  Femoral neck fracture, left, closed, initial encounter        Isla Pence, MD 12/29/15 [phone]

## 2015-12-25 NOTE — ED Notes (Signed)
[facility] notified of need to transport to [facility] for admission, states at least 2 hour delay for transport

## 2015-12-25 NOTE — H&P (Signed)
History and Physical    Bonnie Salazar [id number] DOB: 05/14/1941 DOA: 12/25/2015  PCP: [name]   Patient coming from: Home  Chief Complaint: Left hip pain  HPI: Bonnie Salazar is a 75 y.o. woman without significant past medical history who feels that she was in her baseline state of health until she sustained an accidental fall at home.  She tripped on a open drawer, causing her fall.  She subsequently developed left hip pain, 10 out of 10 in intensity with any movement.  No LOC.  No chest pain or shortness of breath.  No swelling.  No palpitations.  Recent Z pack for URI symptoms.  Denies LUTS.  No recent fever.  ED Course: Xray reveals left femoral neck fracture.  Pain has improved with IV narcotics.  Orthopedic surgery consulted from the ED (Dr. Percell Miller).  Hospitalist to admit the patient to [facility].  Review of Systems: As per HPI otherwise 10 point review of systems negative.   Past Medical History  Diagnosis Date  . Medical history non-contributory     Past Surgical History  Procedure Laterality Date  . Back surgery      She is married.  She has three adult children.  Remote tobacco use.  No illicit drug use.  No EtOH.  No Known Allergies  Family History  Problem Relation Age of Onset  . Hodgkin's lymphoma Mother   . Stroke Father     Prior to Admission medications   Medication Sig Start Date End Date Taking? Authorizing Provider  CALCIUM PO Take 2 tablets by mouth daily.   Yes Historical Provider, MD  chlorhexidine (PERIDEX) 0.12 % solution 15 mLs by Mouth Rinse route 2 (two) times daily. 12/22/15  Yes Historical Provider, MD  Cholecalciferol (VITAMIN D PO) Take 1 capsule by mouth daily.   Yes Historical Provider, MD  fluticasone (FLONASE) 50 MCG/ACT nasal spray Place 2 sprays into the nose daily as needed for allergies.  11/01/15  Yes Historical Provider, MD  GINKGO BILOBA PO Take 1 capsule by mouth daily.   Yes Historical Provider, MD    Misc Natural Products (GLUCOSAMINE CHONDROITIN TRIPLE) TABS Take 2 tablets by mouth daily.   Yes Historical Provider, MD  Multiple Vitamin (MULTIVITAMIN WITH MINERALS) TABS tablet Take 1 tablet by mouth daily.   Yes Historical Provider, MD  Multiple Vitamins-Minerals (ICAPS AREDS 2) CAPS Take 1 capsule by mouth 2 (two) times daily.   Yes Historical Provider, MD  naproxen sodium (ANAPROX) 220 MG tablet Take 220 mg by mouth at bedtime as needed (pain).   Yes Historical Provider, MD  Omega-3 Fatty Acids (OMEGA 3 PO) Take 1 capsule by mouth daily.   Yes Historical Provider, MD    Physical Exam: Filed Vitals:   12/25/15 1926 12/25/15 1930 12/25/15 2015 12/25/15 2111  BP: 139/79  133/74 145/98  Pulse: 90  86 94  Temp: 97.8 F (36.6 C)     TempSrc: Oral     Resp: 18  16 19   SpO2: 91% 94% 94% 95%      Constitutional: NAD, annoyed about her current situation but nontoxic appearing Filed Vitals:   12/25/15 1926 12/25/15 1930 12/25/15 2015 12/25/15 2111  BP: 139/79  133/74 145/98  Pulse: 90  86 94  Temp: 97.8 F (36.6 C)     TempSrc: Oral     Resp: 18  16 19   SpO2: 91% 94% 94% 95%   Eyes: PERRL, lids and conjunctivae normal ENMT: Mucous membranes are  slightly dry. Posterior pharynx clear of any exudate or lesions.Normal dentition.  Neck: normal, supple Respiratory: clear to auscultation listening anteriorly.  Normal respiratory effort. No accessory muscle use.  Cardiovascular: Regular rate and rhythm, no murmurs / rubs / gallops. No extremity edema. 2+ pedal pulses.  Abdomen: no tenderness, no masses palpated. No hepatosplenomegaly. Bowel sounds positive.  Musculoskeletal: no clubbing / cyanosis. Movement of left lower extremity limited due to pain but moves all other extremities spontaneously with normal ROM and no contractures. Skin: no rashes, warm, dry Neurologic: no focal deficits. Psychiatric: Normal judgment and insight. Alert and oriented x 3. Normal mood.    Labs on  Admission: I have personally reviewed following labs and imaging studies  CBC:  Recent Labs Lab 12/25/15 2013  WBC 12.6*  NEUTROABS 10.8*  HGB 12.0  HCT 35.3*  MCV 85.1  PLT 0000000   Basic Metabolic Panel:  Recent Labs Lab 12/25/15 2013  NA 132*  K 4.3  CL 101  CO2 23  GLUCOSE 143*  BUN 21*  CREATININE 0.76  CALCIUM 9.2   GFR: CrCl cannot be calculated (Unknown ideal weight.).  Coagulation Profile:  Recent Labs Lab 12/25/15 2013  INR 1.13   Urine analysis:    Component Value Date/Time   COLORURINE YELLOW 12/25/2015 2028   APPEARANCEUR CLOUDY* 12/25/2015 2028   LABSPEC 1.016 12/25/2015 2028   PHURINE 6.5 12/25/2015 2028   GLUCOSEU NEGATIVE 12/25/2015 2028   HGBUR SMALL* 12/25/2015 2028   BILIRUBINUR NEGATIVE 12/25/2015 2028   KETONESUR >80* 12/25/2015 2028   PROTEINUR NEGATIVE 12/25/2015 2028   NITRITE NEGATIVE 12/25/2015 2028   LEUKOCYTESUR SMALL* 12/25/2015 2028   Radiological Exams on Admission: Dg Chest 1 View  12/25/2015  CLINICAL DATA:  Recent fall today with left hip pain, initial encounter EXAM: CHEST 1 VIEW COMPARISON:  None. FINDINGS: Cardiac shadow is within normal limits. The lungs are well aerated bilaterally. Mild interstitial changes are seen without focal infiltrate. No acute bony abnormality is seen. IMPRESSION: Mild interstitial changes likely of a chronic nature. No acute abnormality seen. Electronically Signed   By: Inez Catalina M.D.   On: 12/25/2015 21:17   Dg Lumbar Spine Complete  12/25/2015  CLINICAL DATA:  Fall today with low back pain, initial encounter EXAM: LUMBAR SPINE - COMPLETE 4+ VIEW COMPARISON:  None. FINDINGS: Five lumbar type vertebral bodies are well visualized. A mild scoliosis concave to the right is noted. Reactive endplate changes are noted. No anterolisthesis is seen. No soft tissue abnormality is noted. IMPRESSION: Degenerative change without acute abnormality. Electronically Signed   By: Inez Catalina M.D.   On:  12/25/2015 21:17   Dg Hip Unilat With Pelvis 2-3 Views Left  12/25/2015  CLINICAL DATA:  Recent fall with hip pain, initial encounter EXAM: DG HIP (WITH OR WITHOUT PELVIS) 2-3V LEFT COMPARISON:  None. FINDINGS: There is left femoral neck fracture with impaction and angulation at the fracture site. Pelvic ring is intact. No other focal abnormality is seen. IMPRESSION: Left femoral neck fracture. Electronically Signed   By: Inez Catalina M.D.   On: 12/25/2015 21:16   Dg Femur 1v Left  12/25/2015  CLINICAL DATA:  [address] [address] with left hip pain. EXAM: LEFT FEMUR 1 VIEW COMPARISON:  Left hip 12/25/2015 FINDINGS: Single view of the left femur was obtained. There is a fracture involving the proximal left femur at the junction of the femoral head and neck. There is superior displacement of the left femoral neck. Findings are suggestive for a subcapital  hip fracture. The mid and distal femur appear to be intact on this single view. IMPRESSION: Fracture of the proximal left femur. Findings are compatible with a subcapital hip fracture. Electronically Signed   By: Markus Daft M.D.   On: 12/25/2015 21:17    EKG: Pending  Assessment/Plan Principal Problem:   Hip fracture (HCC) Active Problems:   Leukocytosis   Dehydration     Left femoral neck fracture after accidental fall --Admit to [facility] --Ortho consult pending --NPO --Appears to be low risk for peri-operative cardiac complications --DVT prophylaxis per Ortho --Analgesics, muscle relaxers, anti-emetics as needed --Bowel regimen  Mild dehydration (low sodium, elevated BUN) --NS at 75 cc/hr --Repeat BMP in the AM  Leukocytosis --Likely acute phase reactant.  No localizing symptoms of infection. --Repeat CBC in the AM  DVT prophylaxis: Per ortho, after surgery Code Status: FULL Family Communication: Husband at bedside at time of admission Disposition Plan: To be determined Consults called: Dr. Percell Miller, Ortho Admission status: Inpatient, med  surg  Eber Jones MD [facility]   If 7PM-7AM, please contact night-coverage [email or web address] Password [email or web address]  12/25/2015, 11:13 PM

## 2015-12-26 ENCOUNTER — Encounter (HOSPITAL_COMMUNITY)
Admission: EM | Disposition: A | Discharge status: Rehabilitation Facility | Payer: Self-pay | Source: Home / Self Care | Attending: Pulmonary Disease

## 2015-12-26 ENCOUNTER — Inpatient Hospital Stay (HOSPITAL_COMMUNITY): Payer: Medicare HMO

## 2015-12-26 ENCOUNTER — Inpatient Hospital Stay (HOSPITAL_COMMUNITY): Payer: Medicare HMO | Admitting: Anesthesiology

## 2015-12-26 ENCOUNTER — Encounter (HOSPITAL_COMMUNITY): Payer: Self-pay | Admitting: Anesthesiology

## 2015-12-26 DIAGNOSIS — R579 Shock, unspecified: Secondary | ICD-10-CM

## 2015-12-26 DIAGNOSIS — S72002A Fracture of unspecified part of neck of left femur, initial encounter for closed fracture: Secondary | ICD-10-CM | POA: Diagnosis present

## 2015-12-26 DIAGNOSIS — J9601 Acute respiratory failure with hypoxia: Secondary | ICD-10-CM

## 2015-12-26 DIAGNOSIS — I97631 Postprocedural hematoma of a circulatory system organ or structure following cardiac bypass: Secondary | ICD-10-CM

## 2015-12-26 DIAGNOSIS — D62 Acute posthemorrhagic anemia: Secondary | ICD-10-CM

## 2015-12-26 HISTORY — PX: TOTAL HIP ARTHROPLASTY: SHX124

## 2015-12-26 LAB — POCT I-STAT 7, (LYTES, BLD GAS, ICA,H+H)
ACID-BASE DEFICIT: 4 mmol/L — AB (ref 0.0–2.0)
ACID-BASE DEFICIT: 6 mmol/L — AB (ref 0.0–2.0)
ACID-BASE DEFICIT: 9 mmol/L — AB (ref 0.0–2.0)
ACID-BASE DEFICIT: 9 mmol/L — AB (ref 0.0–2.0)
Acid-base deficit: 8 mmol/L — ABNORMAL HIGH (ref 0.0–2.0)
BICARBONATE: 17.6 meq/L — AB (ref 20.0–24.0)
BICARBONATE: 19.9 meq/L — AB (ref 20.0–24.0)
BICARBONATE: 20.6 meq/L (ref 20.0–24.0)
Bicarbonate: 18.3 meq/L — ABNORMAL LOW (ref 20.0–24.0)
Bicarbonate: 17.6 meq/L — ABNORMAL LOW (ref 20.0–24.0)
CALCIUM ION: 1.01 mmol/L — AB (ref 1.13–1.30)
CALCIUM ION: 1.05 mmol/L — AB (ref 1.13–1.30)
Calcium, Ion: 1.13 mmol/L (ref 1.13–1.30)
Calcium, Ion: 1.01 mmol/L — ABNORMAL LOW (ref 1.13–1.30)
Calcium, Ion: 0.81 mmol/L — ABNORMAL LOW (ref 1.13–1.30)
HCT: 17 % — ABNORMAL LOW (ref 36.0–46.0)
HCT: 28 % — ABNORMAL LOW (ref 36.0–46.0)
HCT: 33 % — ABNORMAL LOW (ref 36.0–46.0)
HEMATOCRIT: 23 % — AB (ref 36.0–46.0)
HEMATOCRIT: 24 % — AB (ref 36.0–46.0)
HEMOGLOBIN: 9.5 g/dL — AB (ref 12.0–15.0)
HEMOGLOBIN: 11.2 g/dL — AB (ref 12.0–15.0)
Hemoglobin: 5.8 g/dL — CL (ref 12.0–15.0)
Hemoglobin: 7.8 g/dL — ABNORMAL LOW (ref 12.0–15.0)
Hemoglobin: 8.2 g/dL — ABNORMAL LOW (ref 12.0–15.0)
O2 SAT: 99 %
O2 Saturation: 100 %
O2 Saturation: 100 %
O2 Saturation: 98 %
O2 Saturation: 100 %
PCO2 ART: 36.9 mmHg (ref 35.0–45.0)
PCO2 ART: 43.7 mmHg (ref 35.0–45.0)
PCO2 ART: 38.5 mmHg (ref 35.0–45.0)
PH ART: 7.287 — AB (ref 7.350–7.450)
PH ART: 7.228 — AB (ref 7.350–7.450)
PH ART: 7.344 — AB (ref 7.350–7.450)
PH ART: 7.372 (ref 7.350–7.450)
PH ART: 7.268 — AB (ref 7.350–7.450)
PO2 ART: 511 mmHg — AB (ref 80.0–100.0)
PO2 ART: 465 mmHg — AB (ref 80.0–100.0)
POTASSIUM: 3.9 mmol/L (ref 3.5–5.1)
POTASSIUM: 3.9 mmol/L (ref 3.5–5.1)
Patient temperature: 36.8
Patient temperature: 35.7
Potassium: 4.1 mmol/L (ref 3.5–5.1)
Potassium: 4 mmol/L (ref 3.5–5.1)
Potassium: 5.1 mmol/L (ref 3.5–5.1)
SODIUM: 139 mmol/L (ref 135–145)
SODIUM: 141 mmol/L (ref 135–145)
Sodium: 135 mmol/L (ref 135–145)
Sodium: 139 mmol/L (ref 135–145)
Sodium: 140 mmol/L (ref 135–145)
TCO2: 19 mmol/L (ref 0–100)
TCO2: 20 mmol/L (ref 0–100)
TCO2: 21 mmol/L (ref 0–100)
TCO2: 22 mmol/L (ref 0–100)
TCO2: 19 mmol/L (ref 0–100)
pCO2 arterial: 35.1 mmHg (ref 35.0–45.0)
pCO2 arterial: 36.1 mmHg (ref 35.0–45.0)
pO2, Arterial: 441 mmHg — ABNORMAL HIGH (ref 80.0–100.0)
pO2, Arterial: 112 mmHg — ABNORMAL HIGH (ref 80.0–100.0)
pO2, Arterial: 121 mmHg — ABNORMAL HIGH (ref 80.0–100.0)

## 2015-12-26 LAB — FIBRINOGEN: Fibrinogen: 113 mg/dL — ABNORMAL LOW (ref 204–475)

## 2015-12-26 LAB — PREPARE RBC (CROSSMATCH)

## 2015-12-26 LAB — POCT I-STAT EG7
Acid-base deficit: 5 mmol/L — ABNORMAL HIGH (ref 0.0–2.0)
Bicarbonate: 20.9 mEq/L (ref 20.0–24.0)
Calcium, Ion: 1.18 mmol/L (ref 1.13–1.30)
HEMATOCRIT: 25 % — AB (ref 36.0–46.0)
HEMOGLOBIN: 8.5 g/dL — AB (ref 12.0–15.0)
O2 SAT: 31 %
POTASSIUM: 3.8 mmol/L (ref 3.5–5.1)
SODIUM: 137 mmol/L (ref 135–145)
TCO2: 22 mmol/L (ref 0–100)
pCO2, Ven: 39.4 mmHg — ABNORMAL LOW (ref 45.0–50.0)
pH, Ven: 7.332 — ABNORMAL HIGH (ref 7.250–7.300)
pO2, Ven: 21 mmHg — ABNORMAL LOW (ref 31.0–45.0)

## 2015-12-26 LAB — PROTIME-INR
INR: 2.01 — ABNORMAL HIGH (ref 0.00–1.49)
PROTHROMBIN TIME: 22.6 s — AB (ref 11.6–15.2)

## 2015-12-26 LAB — CBC
HCT: 32.8 % — ABNORMAL LOW (ref 36.0–46.0)
HCT: 31.6 % — ABNORMAL LOW (ref 36.0–46.0)
Hemoglobin: 10.7 g/dL — ABNORMAL LOW (ref 12.0–15.0)
Hemoglobin: 10.7 g/dL — ABNORMAL LOW (ref 12.0–15.0)
MCH: 27.5 pg (ref 26.0–34.0)
MCH: 29.4 pg (ref 26.0–34.0)
MCHC: 32.6 g/dL (ref 30.0–36.0)
MCHC: 33.9 g/dL (ref 30.0–36.0)
MCV: 84.3 fL (ref 78.0–100.0)
MCV: 86.8 fL (ref 78.0–100.0)
Platelets: 218 10*3/uL (ref 150–400)
Platelets: 87 10*3/uL — ABNORMAL LOW (ref 150–400)
RBC: 3.89 MIL/uL (ref 3.87–5.11)
RBC: 3.64 MIL/uL — ABNORMAL LOW (ref 3.87–5.11)
RDW: 13 % (ref 11.5–15.5)
RDW: 16.5 % — AB (ref 11.5–15.5)
WBC: 9.7 10*3/uL (ref 4.0–10.5)
WBC: 20.3 10*3/uL — ABNORMAL HIGH (ref 4.0–10.5)

## 2015-12-26 LAB — ABO/RH: ABO/RH(D): B POS

## 2015-12-26 LAB — BASIC METABOLIC PANEL
Anion gap: 6 (ref 5–15)
BUN: 15 mg/dL (ref 6–20)
CALCIUM: 8.9 mg/dL (ref 8.9–10.3)
CO2: 25 mmol/L (ref 22–32)
CREATININE: 0.77 mg/dL (ref 0.44–1.00)
Chloride: 103 mmol/L (ref 101–111)
GFR calc non Af Amer: >60 mL/min (ref >60)
Glucose, Bld: 128 mg/dL — ABNORMAL HIGH (ref 65–99)
Potassium: 4.1 mmol/L (ref 3.5–5.1)
SODIUM: 134 mmol/L — AB (ref 135–145)

## 2015-12-26 LAB — SURGICAL PCR SCREEN
MRSA, PCR: NEGATIVE
STAPHYLOCOCCUS AUREUS: NEGATIVE

## 2015-12-26 LAB — MASSIVE TRANSFUSION PROTOCOL ORDER (BLOOD BANK NOTIFICATION)

## 2015-12-26 LAB — GLUCOSE, CAPILLARY: Glucose-Capillary: 209 mg/dL — ABNORMAL HIGH (ref 65–99)

## 2015-12-26 LAB — TROPONIN I: TROPONIN I: 0.03 ng/mL (ref <0.031)

## 2015-12-26 LAB — POCT ACTIVATED CLOTTING TIME: Activated Clotting Time: 114 s

## 2015-12-26 LAB — APTT: APTT: 29 s (ref 24–37)

## 2015-12-26 SURGERY — ARTHROPLASTY, HIP, TOTAL, ANTERIOR APPROACH
Anesthesia: General | Laterality: Left

## 2015-12-26 MED ORDER — ARTIFICIAL TEARS OP OINT
TOPICAL_OINTMENT | OPHTHALMIC | Status: DC | PRN
  Administered 2015-12-26: 1 via OPHTHALMIC

## 2015-12-26 MED ORDER — DEXTROSE 5 % IV SOLN
10.0000 mg | INTRAVENOUS | Status: DC | PRN
  Administered 2015-12-26: 10 ug/min via INTRAVENOUS

## 2015-12-26 MED ORDER — MIDAZOLAM HCL 5 MG/5ML IJ SOLN
INTRAMUSCULAR | Status: DC | PRN
  Administered 2015-12-26 (×2): 2 mg via INTRAVENOUS
  Administered 2015-12-26 (×2): 1 mg via INTRAVENOUS

## 2015-12-26 MED ORDER — SODIUM CHLORIDE 0.9 % IV SOLN
INTRAVENOUS | Status: DC | PRN
  Administered 2015-12-26 (×3): via INTRAVENOUS

## 2015-12-26 MED ORDER — FENTANYL CITRATE (PF) 250 MCG/5ML IJ SOLN
INTRAMUSCULAR | Status: AC
  Filled 2015-12-26: qty 5

## 2015-12-26 MED ORDER — PROPOFOL 1000 MG/100ML IV EMUL
0.0000 ug/kg/min | INTRAVENOUS | Status: DC
  Administered 2015-12-26: 5 ug/kg/min via INTRAVENOUS
  Filled 2015-12-26: qty 100

## 2015-12-26 MED ORDER — POVIDONE-IODINE 10 % EX SWAB: 2.0000 "application " | Freq: Once | CUTANEOUS | Status: DC

## 2015-12-26 MED ORDER — BUPIVACAINE HCL (PF) 0.25 % IJ SOLN
INTRAMUSCULAR | Status: AC
  Filled 2015-12-26: qty 30

## 2015-12-26 MED ORDER — IOPAMIDOL (ISOVUE-300) INJECTION 61%
INTRAVENOUS | Status: AC
  Filled 2015-12-26: qty 100

## 2015-12-26 MED ORDER — ONDANSETRON HCL 4 MG/2ML IJ SOLN
INTRAMUSCULAR | Status: DC | PRN
  Administered 2015-12-26: 4 mg via INTRAVENOUS

## 2015-12-26 MED ORDER — FENTANYL CITRATE (PF) 100 MCG/2ML IJ SOLN
50.0000 ug | INTRAMUSCULAR | Status: DC | PRN
  Administered 2015-12-26: 50 ug via INTRAVENOUS

## 2015-12-26 MED ORDER — PHENYLEPHRINE HCL 10 MG/ML IJ SOLN
INTRAMUSCULAR | Status: DC | PRN
  Administered 2015-12-26 (×2): 80 ug via INTRAVENOUS
  Administered 2015-12-26: 40 ug via INTRAVENOUS

## 2015-12-26 MED ORDER — ONDANSETRON HCL 4 MG PO TABS: 4.0000 mg | ORAL_TABLET | Freq: Three times a day (TID) | ORAL | Status: DC | PRN

## 2015-12-26 MED ORDER — METOCLOPRAMIDE HCL 5 MG/ML IJ SOLN
5.0000 mg | Freq: Three times a day (TID) | INTRAMUSCULAR | Status: DC | PRN
  Filled 2015-12-26: qty 2

## 2015-12-26 MED ORDER — ACETAMINOPHEN 325 MG PO TABS: 650.0000 mg | ORAL_TABLET | Freq: Four times a day (QID) | ORAL | Status: DC | PRN

## 2015-12-26 MED ORDER — BUPIVACAINE-EPINEPHRINE 0.25% -1:200000 IJ SOLN: INTRAMUSCULAR | Status: DC | PRN

## 2015-12-26 MED ORDER — OXYCODONE HCL 5 MG PO TABS: 5.0000 mg | ORAL_TABLET | ORAL | Status: DC | PRN

## 2015-12-26 MED ORDER — HYDROCODONE-ACETAMINOPHEN 5-325 MG PO TABS: 1.0000 | ORAL_TABLET | Freq: Four times a day (QID) | ORAL | Status: DC | PRN

## 2015-12-26 MED ORDER — CALCIUM CHLORIDE 10 % IV SOLN
INTRAVENOUS | Status: DC | PRN
  Administered 2015-12-26: 1000 mg via INTRAVENOUS

## 2015-12-26 MED ORDER — SODIUM CHLORIDE 0.9 % IV SOLN
25.0000 ug/h | INTRAVENOUS | Status: DC
  Administered 2015-12-26: 50 ug/h via INTRAVENOUS
  Filled 2015-12-26: qty 50

## 2015-12-26 MED ORDER — ACETAMINOPHEN 500 MG PO TABS: 1000.0000 mg | ORAL_TABLET | Freq: Once | ORAL | Status: DC

## 2015-12-26 MED ORDER — LACTATED RINGERS IV SOLN
INTRAVENOUS | Status: DC
  Administered 2015-12-26: 14:00:00 via INTRAVENOUS

## 2015-12-26 MED ORDER — MIDAZOLAM HCL 2 MG/2ML IJ SOLN
INTRAMUSCULAR | Status: AC
  Administered 2015-12-26: 1 mg via INTRAVENOUS
  Filled 2015-12-26: qty 2

## 2015-12-26 MED ORDER — SODIUM CHLORIDE 0.9 % IV SOLN
Freq: Once | INTRAVENOUS | Status: DC
Start: 2015-12-26 — End: 2015-12-26

## 2015-12-26 MED ORDER — SUGAMMADEX SODIUM 200 MG/2ML IV SOLN
INTRAVENOUS | Status: DC | PRN
  Administered 2015-12-26: 200 mg via INTRAVENOUS

## 2015-12-26 MED ORDER — LIDOCAINE HCL 1 % IJ SOLN
INTRAMUSCULAR | Status: AC
  Filled 2015-12-26: qty 20

## 2015-12-26 MED ORDER — FENTANYL BOLUS VIA INFUSION
25.0000 ug | INTRAVENOUS | Status: DC | PRN
  Filled 2015-12-26: qty 25

## 2015-12-26 MED ORDER — HYDROMORPHONE HCL 1 MG/ML IJ SOLN
0.5000 mg | INTRAMUSCULAR | Status: DC | PRN
  Administered 2015-12-26 (×3): 0.5 mg via INTRAVENOUS
  Filled 2015-12-26 (×3): qty 1

## 2015-12-26 MED ORDER — ONDANSETRON HCL 4 MG/2ML IJ SOLN
4.0000 mg | Freq: Four times a day (QID) | INTRAMUSCULAR | Status: DC | PRN
  Filled 2015-12-26: qty 2

## 2015-12-26 MED ORDER — FAMOTIDINE IN NACL 20-0.9 MG/50ML-% IV SOLN
20.0000 mg | Freq: Two times a day (BID) | INTRAVENOUS | Status: DC
  Administered 2015-12-27 – 2016-01-01 (×13): 20 mg via INTRAVENOUS
  Filled 2015-12-26 (×15): qty 50

## 2015-12-26 MED ORDER — METHOCARBAMOL 1000 MG/10ML IJ SOLN
500.0000 mg | Freq: Four times a day (QID) | INTRAMUSCULAR | Status: DC | PRN
  Filled 2015-12-26: qty 5

## 2015-12-26 MED ORDER — PROPOFOL 10 MG/ML IV BOLUS
INTRAVENOUS | Status: AC
  Filled 2015-12-26: qty 20

## 2015-12-26 MED ORDER — ALBUMIN HUMAN 5 % IV SOLN
INTRAVENOUS | Status: DC | PRN
  Administered 2015-12-26: 16:00:00 via INTRAVENOUS

## 2015-12-26 MED ORDER — MIDAZOLAM HCL 2 MG/2ML IJ SOLN
INTRAMUSCULAR | Status: AC
  Filled 2015-12-26: qty 2

## 2015-12-26 MED ORDER — FENTANYL CITRATE (PF) 100 MCG/2ML IJ SOLN
INTRAMUSCULAR | Status: AC
  Filled 2015-12-26: qty 2

## 2015-12-26 MED ORDER — DEXAMETHASONE SODIUM PHOSPHATE 10 MG/ML IJ SOLN
10.0000 mg | Freq: Once | INTRAMUSCULAR | Status: DC
  Filled 2015-12-26: qty 1

## 2015-12-26 MED ORDER — DEXTROSE-NACL 5-0.45 % IV SOLN
100.0000 mL/h | INTRAVENOUS | Status: DC
  Administered 2015-12-26: 100 mL/h via INTRAVENOUS

## 2015-12-26 MED ORDER — FENTANYL CITRATE (PF) 100 MCG/2ML IJ SOLN
INTRAMUSCULAR | Status: AC
  Administered 2015-12-26: 50 ug via INTRAVENOUS
  Filled 2015-12-26: qty 2

## 2015-12-26 MED ORDER — METOCLOPRAMIDE HCL 10 MG PO TABS: 5.0000 mg | ORAL_TABLET | Freq: Three times a day (TID) | ORAL | Status: DC | PRN

## 2015-12-26 MED ORDER — EPHEDRINE SULFATE 50 MG/ML IJ SOLN
INTRAMUSCULAR | Status: DC | PRN
  Administered 2015-12-26 (×2): 10 mg via INTRAVENOUS

## 2015-12-26 MED ORDER — ASPIRIN EC 325 MG PO TBEC: 325.0000 mg | DELAYED_RELEASE_TABLET | Freq: Every day | ORAL | Status: DC

## 2015-12-26 MED ORDER — ENSURE ENLIVE PO LIQD: 237.0000 mL | Freq: Two times a day (BID) | ORAL | Status: DC

## 2015-12-26 MED ORDER — TRANEXAMIC ACID 1000 MG/10ML IV SOLN
2000.0000 mg | INTRAVENOUS | Status: AC
  Filled 2015-12-26: qty 20

## 2015-12-26 MED ORDER — ROCURONIUM BROMIDE 100 MG/10ML IV SOLN
INTRAVENOUS | Status: DC | PRN
  Administered 2015-12-26: 50 mg via INTRAVENOUS

## 2015-12-26 MED ORDER — ASPIRIN EC 325 MG PO TBEC
325.0000 mg | DELAYED_RELEASE_TABLET | Freq: Every day | ORAL | Status: DC
Start: 2015-12-27 — End: 2015-12-28
  Filled 2015-12-26 (×2): qty 1

## 2015-12-26 MED ORDER — VASOPRESSIN 20 UNIT/ML IV SOLN
INTRAVENOUS | Status: DC | PRN
  Administered 2015-12-26 (×2): 5 [IU] via INTRAVENOUS

## 2015-12-26 MED ORDER — LACTATED RINGERS IV SOLN
INTRAVENOUS | Status: DC | PRN
  Administered 2015-12-26 (×3): via INTRAVENOUS

## 2015-12-26 MED ORDER — ONDANSETRON HCL 4 MG PO TABS: 4.0000 mg | ORAL_TABLET | Freq: Four times a day (QID) | ORAL | Status: DC | PRN

## 2015-12-26 MED ORDER — SCOPOLAMINE 1 MG/3DAYS TD PT72
MEDICATED_PATCH | TRANSDERMAL | Status: AC
  Filled 2015-12-26: qty 1

## 2015-12-26 MED ORDER — MIDAZOLAM HCL 2 MG/2ML IJ SOLN
1.0000 mg | INTRAMUSCULAR | Status: DC | PRN
  Administered 2015-12-26: 1 mg via INTRAVENOUS

## 2015-12-26 MED ORDER — MENTHOL 3 MG MT LOZG: 1.0000 | LOZENGE | OROMUCOSAL | Status: DC | PRN

## 2015-12-26 MED ORDER — TRANEXAMIC ACID 1000 MG/10ML IV SOLN
1000.0000 mg | INTRAVENOUS | Status: AC
  Administered 2015-12-26: 1000 mg via INTRAVENOUS
  Filled 2015-12-26: qty 10

## 2015-12-26 MED ORDER — TRANEXAMIC ACID 1000 MG/10ML IV SOLN
2000.0000 mg | INTRAVENOUS | Status: DC | PRN
  Administered 2015-12-26: 2000 mg via INTRAVENOUS

## 2015-12-26 MED ORDER — OMEPRAZOLE 20 MG PO CPDR: 20.0000 mg | DELAYED_RELEASE_CAPSULE | Freq: Every day | ORAL | Status: DC

## 2015-12-26 MED ORDER — SODIUM CHLORIDE 0.9 % IV SOLN
INTRAVENOUS | Status: DC
  Administered 2015-12-26 – 2015-12-31 (×8): via INTRAVENOUS

## 2015-12-26 MED ORDER — PHENOL 1.4 % MT LIQD: 1.0000 | OROMUCOSAL | Status: DC | PRN

## 2015-12-26 MED ORDER — DIPHENHYDRAMINE HCL 12.5 MG/5ML PO ELIX
12.5000 mg | ORAL_SOLUTION | ORAL | Status: DC | PRN
  Filled 2015-12-26: qty 10

## 2015-12-26 MED ORDER — PROPOFOL 10 MG/ML IV BOLUS
INTRAVENOUS | Status: DC | PRN
  Administered 2015-12-26: 90 mg via INTRAVENOUS

## 2015-12-26 MED ORDER — OXYCODONE-ACETAMINOPHEN 5-325 MG PO TABS: 1.0000 | ORAL_TABLET | ORAL | Status: DC | PRN

## 2015-12-26 MED ORDER — 0.9 % SODIUM CHLORIDE (POUR BTL) OPTIME
TOPICAL | Status: DC | PRN
  Administered 2015-12-26: 1000 mL

## 2015-12-26 MED ORDER — MIDAZOLAM HCL 2 MG/2ML IJ SOLN: 1.0000 mg | INTRAMUSCULAR | Status: DC | PRN

## 2015-12-26 MED ORDER — CHLORHEXIDINE GLUCONATE 4 % EX LIQD
60.0000 mL | Freq: Once | CUTANEOUS | Status: DC
  Filled 2015-12-26: qty 60

## 2015-12-26 MED ORDER — CEFAZOLIN SODIUM-DEXTROSE 2-4 GM/100ML-% IV SOLN
2.0000 g | INTRAVENOUS | Status: AC
  Administered 2015-12-26: 2 g via INTRAVENOUS
  Filled 2015-12-26 (×2): qty 100

## 2015-12-26 MED ORDER — METHOCARBAMOL 500 MG PO TABS
500.0000 mg | ORAL_TABLET | Freq: Four times a day (QID) | ORAL | Status: DC | PRN
  Filled 2015-12-26: qty 1

## 2015-12-26 MED ORDER — SODIUM CHLORIDE FLUSH 0.9 % IV SOLN: INTRAVENOUS | Status: DC | PRN

## 2015-12-26 MED ORDER — KETOROLAC TROMETHAMINE 30 MG/ML IJ SOLN: INTRAMUSCULAR | Status: DC | PRN

## 2015-12-26 MED ORDER — LACTATED RINGERS IV SOLN
INTRAVENOUS | Status: DC | PRN
  Administered 2015-12-26: 16:00:00 via INTRAVENOUS

## 2015-12-26 MED ORDER — SODIUM CHLORIDE 0.9 % IV SOLN: Freq: Once | INTRAVENOUS | Status: DC

## 2015-12-26 MED ORDER — CEFAZOLIN SODIUM-DEXTROSE 2-4 GM/100ML-% IV SOLN
2.0000 g | Freq: Four times a day (QID) | INTRAVENOUS | Status: AC
  Administered 2015-12-26 – 2015-12-27 (×2): 2 g via INTRAVENOUS
  Filled 2015-12-26 (×2): qty 100

## 2015-12-26 MED ORDER — LIDOCAINE HCL (CARDIAC) 20 MG/ML IV SOLN
INTRAVENOUS | Status: DC | PRN
  Administered 2015-12-26: 60 mg via INTRAVENOUS

## 2015-12-26 MED ORDER — FENTANYL CITRATE (PF) 100 MCG/2ML IJ SOLN
INTRAMUSCULAR | Status: DC | PRN
  Administered 2015-12-26 (×2): 50 ug via INTRAVENOUS
  Administered 2015-12-26: 25 ug via INTRAVENOUS
  Administered 2015-12-26 (×4): 50 ug via INTRAVENOUS

## 2015-12-26 MED ORDER — FENTANYL CITRATE (PF) 250 MCG/5ML IJ SOLN
INTRAMUSCULAR | Status: AC
  Filled 2015-12-26: qty 10

## 2015-12-26 MED ORDER — DOCUSATE SODIUM 100 MG PO CAPS
100.0000 mg | ORAL_CAPSULE | Freq: Two times a day (BID) | ORAL | Status: DC
  Filled 2015-12-26 (×5): qty 1

## 2015-12-26 SURGICAL SUPPLY — 54 items
BAG DECANTER FOR FLEXI CONT (MISCELLANEOUS) ×4 IMPLANT
BLADE SAG 18X100X1.27 (BLADE) IMPLANT
BLADE SAW SGTL 18X1.27X75 (BLADE) ×2 IMPLANT
BLADE SURG ROTATE 9660 (MISCELLANEOUS) IMPLANT
CAPT HIP TOTAL 2 ×2 IMPLANT
CLSR STERI-STRIP ANTIMIC 1/2X4 (GAUZE/BANDAGES/DRESSINGS) ×4 IMPLANT
COVER PERINEAL POST (MISCELLANEOUS) ×2 IMPLANT
COVER SURGICAL LIGHT HANDLE (MISCELLANEOUS) ×2 IMPLANT
DRAPE C-ARM 42X72 X-RAY (DRAPES) ×2 IMPLANT
DRAPE STERI IOBAN 125X83 (DRAPES) ×2 IMPLANT
DRAPE U-SHAPE 47X51 STRL (DRAPES) ×4 IMPLANT
DRSG MEPILEX BORDER 4X8 (GAUZE/BANDAGES/DRESSINGS) ×2 IMPLANT
DURAPREP 26ML APPLICATOR (WOUND CARE) ×2 IMPLANT
ELECT BLADE 4.0 EZ CLEAN MEGAD (MISCELLANEOUS) ×2
ELECT REM PT RETURN 9FT ADLT (ELECTROSURGICAL) ×2
ELECTRODE BLDE 4.0 EZ CLN MEGD (MISCELLANEOUS) ×1 IMPLANT
ELECTRODE REM PT RTRN 9FT ADLT (ELECTROSURGICAL) ×1 IMPLANT
FACESHIELD WRAPAROUND (MASK) ×4 IMPLANT
GLOVE BIO SURGEON STRL SZ7 (GLOVE) ×2 IMPLANT
GLOVE BIO SURGEON STRL SZ7.5 (GLOVE) ×2 IMPLANT
GLOVE BIOGEL PI IND STRL 7.0 (GLOVE) ×2 IMPLANT
GLOVE BIOGEL PI IND STRL 8 (GLOVE) ×1 IMPLANT
GLOVE BIOGEL PI INDICATOR 7.0 (GLOVE) ×2
GLOVE BIOGEL PI INDICATOR 8 (GLOVE) ×1
GLOVE SURG SS PI 6.5 STRL IVOR (GLOVE) ×2 IMPLANT
GOWN STRL REUS W/ TWL LRG LVL3 (GOWN DISPOSABLE) ×2 IMPLANT
GOWN STRL REUS W/ TWL XL LVL3 (GOWN DISPOSABLE) ×1 IMPLANT
GOWN STRL REUS W/TWL LRG LVL3 (GOWN DISPOSABLE) ×2
GOWN STRL REUS W/TWL XL LVL3 (GOWN DISPOSABLE) ×1
HEMOSTAT SURGICEL .5X2 ABSORB (HEMOSTASIS) ×2 IMPLANT
KIT BASIN OR (CUSTOM PROCEDURE TRAY) ×2 IMPLANT
KIT ROOM TURNOVER OR (KITS) ×2 IMPLANT
MANIFOLD NEPTUNE II (INSTRUMENTS) ×2 IMPLANT
NDL SAFETY ECLIPSE 18X1.5 (NEEDLE) ×1 IMPLANT
NEEDLE 18GX1X1/2 (RX/OR ONLY) (NEEDLE) ×2 IMPLANT
NEEDLE HYPO 18GX1.5 SHARP (NEEDLE) ×1
NS IRRIG 1000ML POUR BTL (IV SOLUTION) ×2 IMPLANT
PACK TOTAL JOINT (CUSTOM PROCEDURE TRAY) ×2 IMPLANT
PACK UNIVERSAL I (CUSTOM PROCEDURE TRAY) ×2 IMPLANT
PAD ARMBOARD 7.5X6 YLW CONV (MISCELLANEOUS) ×2 IMPLANT
SPONGE LAP 18X18 X RAY DECT (DISPOSABLE) IMPLANT
SUT MNCRL AB 4-0 PS2 18 (SUTURE) ×2 IMPLANT
SUT MON AB 2-0 CT1 36 (SUTURE) ×4 IMPLANT
SUT VIC AB 0 CT1 27 (SUTURE) ×1
SUT VIC AB 0 CT1 27XBRD ANBCTR (SUTURE) ×1 IMPLANT
SUT VIC AB 1 CT1 27 (SUTURE) ×1
SUT VIC AB 1 CT1 27XBRD ANBCTR (SUTURE) ×1 IMPLANT
SYR 50ML LL SCALE MARK (SYRINGE) ×2 IMPLANT
SYRINGE 20CC LL (MISCELLANEOUS) IMPLANT
TOWEL OR 17X24 6PK STRL BLUE (TOWEL DISPOSABLE) ×2 IMPLANT
TOWEL OR 17X26 10 PK STRL BLUE (TOWEL DISPOSABLE) ×2 IMPLANT
TRAY FOLEY CATH 16FRSI W/METER (SET/KITS/TRAYS/PACK) ×2 IMPLANT
WATER STERILE IRR 1000ML POUR (IV SOLUTION) ×2 IMPLANT
YANKAUER SUCT BULB TIP NO VENT (SUCTIONS) ×2 IMPLANT

## 2015-12-26 NOTE — Progress Notes (Signed)
Report called to Sonia Baller, RN; patient going to OR for surgery.

## 2015-12-26 NOTE — Progress Notes (Signed)
Patient ID: Bonnie Salazar, female   DOB: 1940/08/15, 75 y.o.   MRN: [id number]   Patient in 34M after IR embolization.   HR down to 115 BP stable - off pressors Making some urine Feet warm - palpable pulses; moving both feet on command Abd - distended; firm; non-tender  May require more sedation for tonight  No signs of abdominal compartment syndrome Transfusion/ correction of coagulopathy per CCM  Will follow.  Imogene Burn. Georgette Dover, MD, [facility]  General/ Trauma Surgery  12/26/2015 8:58 PM

## 2015-12-26 NOTE — Sedation Documentation (Addendum)
Anesthesia maintaning care.  GEtting ready for Tx to 35M

## 2015-12-26 NOTE — Discharge Instructions (Signed)

## 2015-12-26 NOTE — H&P (View-Only) (Signed)
ORTHOPAEDIC CONSULTATION  REQUESTING PHYSICIAN: No att. providers found  Chief Complaint: Left hip pain s/p fall  Assessment: Principal Problem:   Hip fracture ([place]) - LEFT Active Problems:   Leukocytosis   Dehydration  Left femoral neck fracture.  Plan: Left hip hemiarthroplasty planned for 12/26/15 in the P.M. Weight Bearing Status: NWB - Will plan for WBAT post op. PT VTE px: SCD's and ASA post op.  HPI: Bonnie Salazar is a 75 y.o. female who complains of  Left hip pain s/p fall at home.  No LOC.  She tripped on a drawer.  Pain was rated as a 10/10 at the time of injury.  No CP, SOB.  XRays show left femoral neck fracture.  Orthopedics was consulted for evaluation.    Past Medical History  Diagnosis Date  . Medical history non-contributory    Past Surgical History  Procedure Laterality Date  . Back surgery     Social History   Social History  . Marital Status: Married    Spouse Name: N/A  . Number of Children: N/A  . Years of Education: N/A   Social History Main Topics  . Smoking status: Former Research scientist (life sciences)  . Smokeless tobacco: Former Systems developer    Quit date: 12/11/1995  . Alcohol Use: No  . Drug Use: No  . Sexual Activity: Not Asked   Other Topics Concern  . None   Social History Narrative  . None   Family History  Problem Relation Age of Onset  . Hodgkin's lymphoma Mother   . Stroke Father    No Known Allergies Prior to Admission medications   Medication Sig Start Date End Date Taking? Authorizing Provider  CALCIUM PO Take 2 tablets by mouth daily.   Yes Historical Provider, MD  chlorhexidine (PERIDEX) 0.12 % solution 15 mLs by Mouth Rinse route 2 (two) times daily. 12/22/15  Yes Historical Provider, MD  Cholecalciferol (VITAMIN D PO) Take 1 capsule by mouth daily.   Yes Historical Provider, MD  fluticasone (FLONASE) 50 MCG/ACT nasal spray Place 2 sprays into the nose daily as needed for allergies.  11/01/15  Yes Historical Provider, MD  GINKGO  BILOBA PO Take 1 capsule by mouth daily.   Yes Historical Provider, MD  Misc Natural Products (GLUCOSAMINE CHONDROITIN TRIPLE) TABS Take 2 tablets by mouth daily.   Yes Historical Provider, MD  Multiple Vitamin (MULTIVITAMIN WITH MINERALS) TABS tablet Take 1 tablet by mouth daily.   Yes Historical Provider, MD  Multiple Vitamins-Minerals (ICAPS AREDS 2) CAPS Take 1 capsule by mouth 2 (two) times daily.   Yes Historical Provider, MD  naproxen sodium (ANAPROX) 220 MG tablet Take 220 mg by mouth at bedtime as needed (pain).   Yes Historical Provider, MD  Omega-3 Fatty Acids (OMEGA 3 PO) Take 1 capsule by mouth daily.   Yes Historical Provider, MD   Dg Chest 1 View  12/25/2015  CLINICAL DATA:  Recent fall today with left hip pain, initial encounter EXAM: CHEST 1 VIEW COMPARISON:  None. FINDINGS: Cardiac shadow is within normal limits. The lungs are well aerated bilaterally. Mild interstitial changes are seen without focal infiltrate. No acute bony abnormality is seen. IMPRESSION: Mild interstitial changes likely of a chronic nature. No acute abnormality seen. Electronically Signed   By: Inez Catalina M.D.   On: 12/25/2015 21:17   Dg Lumbar Spine Complete  12/25/2015  CLINICAL DATA:  Fall today with low back pain, initial encounter EXAM: LUMBAR SPINE - COMPLETE 4+ VIEW  COMPARISON:  None. FINDINGS: Five lumbar type vertebral bodies are well visualized. A mild scoliosis concave to the right is noted. Reactive endplate changes are noted. No anterolisthesis is seen. No soft tissue abnormality is noted. IMPRESSION: Degenerative change without acute abnormality. Electronically Signed   By: Inez Catalina M.D.   On: 12/25/2015 21:17   Dg Hip Unilat With Pelvis 2-3 Views Left  12/25/2015  CLINICAL DATA:  Recent fall with hip pain, initial encounter EXAM: DG HIP (WITH OR WITHOUT PELVIS) 2-3V LEFT COMPARISON:  None. FINDINGS: There is left femoral neck fracture with impaction and angulation at the fracture site. Pelvic  ring is intact. No other focal abnormality is seen. IMPRESSION: Left femoral neck fracture. Electronically Signed   By: Inez Catalina M.D.   On: 12/25/2015 21:16   Dg Femur 1v Left  12/25/2015  CLINICAL DATA:  [place] with left hip pain. EXAM: LEFT FEMUR 1 VIEW COMPARISON:  Left hip 12/25/2015 FINDINGS: Single view of the left femur was obtained. There is a fracture involving the proximal left femur at the junction of the femoral head and neck. There is superior displacement of the left femoral neck. Findings are suggestive for a subcapital hip fracture. The mid and distal femur appear to be intact on this single view. IMPRESSION: Fracture of the proximal left femur. Findings are compatible with a subcapital hip fracture. Electronically Signed   By: Markus Daft M.D.   On: 12/25/2015 21:17    Positive ROS: All other systems have been reviewed and were otherwise negative with the exception of those mentioned in the HPI and as above.  Objective: Labs cbc  Recent Labs  12/25/15 2013 12/26/15 0444  WBC 12.6* 9.7  HGB 12.0 10.7*  HCT 35.3* 32.8*  PLT 246 218    Recent Labs  12/25/15 2013 12/26/15 0444  NA 132* 134*  K 4.3 4.1  CL 101 103  CO2 23 25  GLUCOSE 143* 128*  BUN 21* 15  CREATININE 0.76 0.77  CALCIUM 9.2 8.9    Physical Exam: Filed Vitals:   12/26/15 0130 12/26/15 0253  BP: 123/65 121/55  Pulse: 73 69  Temp:  97.7 F (36.5 C)  Resp: 16 16   General: Alert, no acute distress Cardiovascular: No pedal edema Respiratory: No cyanosis, no use of accessory musculature GI: No organomegaly, abdomen is soft and non-tender Skin: No lesions in the area of chief complaint  Neurologic: Sensation intact distally save for the below mentioned MSK exam Psychiatric: Patient is competent for consent with normal mood and affect MUSCULOSKELETAL: Left hip pain w/ movement.  Dorsiflexion/Plantarflexion/EHL/FHL intact.  Other extremities are atraumatic with painless ROM and NVI.   Prudencio Burly III PA-C 12/26/2015 6:47 AM

## 2015-12-26 NOTE — Anesthesia Preprocedure Evaluation (Addendum)
Anesthesia Evaluation  Patient identified by MRN, date of birth, ID band Patient awake    Reviewed: Allergy & Precautions, NPO status , Patient's Chart, lab work & pertinent test results  Airway Mallampati: II  TM Distance: >3 FB Neck ROM: Full    Dental  (+) Teeth Intact, Dental Advisory Given   Pulmonary former smoker,    breath sounds clear to auscultation       Cardiovascular negative cardio ROS   Rhythm:Regular Rate:Normal     Neuro/Psych negative neurological ROS     GI/Hepatic negative GI ROS, Neg liver ROS,   Endo/Other  negative endocrine ROS  Renal/GU negative Renal ROS     Musculoskeletal   Abdominal   Peds  Hematology negative hematology ROS (+)   Anesthesia Other Findings   Reproductive/Obstetrics                           Lab Results  Component Value Date   WBC 9.7 12/26/2015   HGB 10.7* 12/26/2015   HCT 32.8* 12/26/2015   MCV 84.3 12/26/2015   PLT 218 12/26/2015   Lab Results  Component Value Date   CREATININE 0.77 12/26/2015   BUN 15 12/26/2015   NA 134* 12/26/2015   K 4.1 12/26/2015   CL 103 12/26/2015   CO2 25 12/26/2015   Lab Results  Component Value Date   INR 1.13 12/25/2015     Anesthesia Physical Anesthesia Plan  ASA: II  Anesthesia Plan: General   Post-op Pain Management:    Induction: Intravenous  Airway Management Planned: Oral ETT  Additional Equipment:   Intra-op Plan:   Post-operative Plan:   Informed Consent: I have reviewed the patients History and Physical, chart, labs and discussed the procedure including the risks, benefits and alternatives for the proposed anesthesia with the patient or authorized representative who has indicated his/her understanding and acceptance.   Dental advisory given  Plan Discussed with: CRNA, Anesthesiologist and Surgeon  Anesthesia Plan Comments:        Anesthesia Quick Evaluation

## 2015-12-26 NOTE — Consult Note (Addendum)
PULMONARY / CRITICAL CARE MEDICINE   Name: Bonnie Salazar MRN: [id number] DOB: 23-Apr-1941    ADMISSION DATE:  12/25/2015 CONSULTATION DATE:  12/26/15  REFERRING MD:  Trauma service  CHIEF COMPLAINT:  Hip fracture; need for mechanical ventilation post-operatively  HISTORY OF PRESENT ILLNESS:   Bonnie Salazar is a 75F who presented 12/25/15 after an fall at home with resultant left femoral neck fracture. She was taken to the OR for operative repair 6/16. The initial repair appeared uneventful and she was extubated in the PACU. Shortly thereafter, she complained of abdominal pain and was noted to have a tense abdomen. CT scan showed extravasation into the LLQ; she was taken to IR and underwent covered stent of the distal external iliac artery to the inguinal ligament. Vascular surgery was also consulted. No signs of distal ischemia on his exam. There is some concern for abdominal compartment syndrome, as well. MTP was initiated and she received 11u pRBC, 7FFP, 2 PLT pheresis, 2 cryoprecipitate.   On arrival to the ICU, she is intubated, sedated, hemodynamically stable.   PAST MEDICAL HISTORY :  She  has a past medical history of Medical history non-contributory.  PAST SURGICAL HISTORY: She  has past surgical history that includes Back surgery.  No Known Allergies  No current facility-administered medications on file prior to encounter.   No current outpatient prescriptions on file prior to encounter.    FAMILY HISTORY:  Her indicated that her mother is deceased. She indicated that her father is deceased.   SOCIAL HISTORY: She  reports that she has quit smoking. She quit smokeless tobacco use about 20 years ago. She reports that she does not drink alcohol or use illicit drugs.  REVIEW OF SYSTEMS:   Unable to obtain 2/2 intubation.   SUBJECTIVE:    VITAL SIGNS: BP 100/59 mmHg  Pulse 79  Temp(Src) 99 F (37.2 C) (Oral)  Resp 16  SpO2 96%  HEMODYNAMICS:    VENTILATOR  SETTINGS: Vent Mode:  [-] PRVC FiO2 (%):  [50 %] 50 % Set Rate:  [14 bmp] 14 bmp Vt Set:  [500 mL] 500 mL PEEP:  [5 cmH20] 5 cmH20 Plateau Pressure:  [20 cmH20] 20 cmH20  INTAKE / OUTPUT: I/O last 3 completed shifts: In: [id number] [I.V.:6000; [id number]; IV Piggyback:250] Out: [id number] [Urine:275; Stool:1; Blood:1100]  PHYSICAL EXAMINATION:  General Well nourished, well developed, no apparent distress  HEENT No gross abnormalities. ETT/OGT in place  Pulmonary Clear to auscultation bilaterally with no wheezes, rales or ronchi. Good effort, symmetrical expansion.   Cardiovascular Normal rate, regular rhythm. S1, s2. No m/r/g. Distal pulses palpable.  Abdomen Distended, hypoactive bowel sounds.  Musculoskeletal L hip post-surgical changes / dressing in place  Lymphatics No cervical, supraclavicular or axillary adenopathy.   Neurologic Grossly intact. No focal deficits.   Skin/Integuement No rash, no cyanosis, no clubbing. Pale. L [place] CVC in place. LLE edematous     LABS:  BMET  Recent Labs Lab 12/25/15 2013 12/26/15 0444  12/26/15 1708 12/26/15 1720 12/26/15 1855  NA 132* 134*  < > 141 139 140  K 4.3 4.1  < > 3.9 3.9 5.1  CL 101 103  --   --   --   --   CO2 23 25  --   --   --   --   BUN 21* 15  --   --   --   --   CREATININE 0.76 0.77  --   --   --   --  GLUCOSE 143* 128*  --   --   --   --   < > = values in this interval not displayed.  Electrolytes  Recent Labs Lab 12/25/15 2013 12/26/15 0444  CALCIUM 9.2 8.9    CBC  Recent Labs Lab 12/25/15 2013 12/26/15 0444  12/26/15 1720 12/26/15 1740 12/26/15 1855  WBC 12.6* 9.7  --   --  20.3*  --   HGB 12.0 10.7*  < > 7.8* 10.7* 11.2*  HCT 35.3* 32.8*  < > 23.0* 31.6* 33.0*  PLT 246 218  --   --  87*  --   < > = values in this interval not displayed.  Coag's  Recent Labs Lab 12/25/15 2013 12/26/15 1740  APTT  --  29  INR 1.13 2.01*    Sepsis Markers No results for input(s): LATICACIDVEN, PROCALCITON,  O2SATVEN in the last 168 hours.  ABG  Recent Labs Lab 12/26/15 1708 12/26/15 1720 12/26/15 1855  PHART 7.372 7.344* 7.268*  PCO2ART 35.1 36.1 38.5  PO2ART 112.0* 121.0* 465.0*    Liver Enzymes No results for input(s): AST, ALT, ALKPHOS, BILITOT, ALBUMIN in the last 168 hours.  Cardiac Enzymes  Recent Labs Lab 12/26/15 1740  TROPONINI 0.03    Glucose  Recent Labs Lab 12/26/15 2036  GLUCAP 209*    Imaging Dg Chest 1 View  12/25/2015  CLINICAL DATA:  Recent fall today with left hip pain, initial encounter EXAM: CHEST 1 VIEW COMPARISON:  None. FINDINGS: Cardiac shadow is within normal limits. The lungs are well aerated bilaterally. Mild interstitial changes are seen without focal infiltrate. No acute bony abnormality is seen. IMPRESSION: Mild interstitial changes likely of a chronic nature. No acute abnormality seen. Electronically Signed   By: Bonnie Salazar M.D.   On: 12/25/2015 21:17   Dg Lumbar Spine Complete  12/25/2015  CLINICAL DATA:  Fall today with low back pain, initial encounter EXAM: LUMBAR SPINE - COMPLETE 4+ VIEW COMPARISON:  None. FINDINGS: Five lumbar type vertebral bodies are well visualized. A mild scoliosis concave to the right is noted. Reactive endplate changes are noted. No anterolisthesis is seen. No soft tissue abnormality is noted. IMPRESSION: Degenerative change without acute abnormality. Electronically Signed   By: Bonnie Salazar M.D.   On: 12/25/2015 21:17   Ct Pelvis W Contrast  12/26/2015  CLINICAL DATA:  75 year old with large pelvic hematoma following left hip replacement. Evaluate for pelvic bleeding. EXAM: CT PELVIS WITH CONTRAST TECHNIQUE: Multidetector CT imaging of the pelvis was performed using the standard protocol following the bolus administration of intravenous contrast. CONTRAST:  100 mL Isovue COMPARISON:  None. FINDINGS: Vascular structures: The distal abdominal aorta and common iliac arteries are patent. There is active contrast  extravasation in the left hemipelvis originating from the proximal left common femoral artery or distal left external iliac artery. Bleeding is originating near the origin of the left inferior epigastric artery but may be separate from this vessel. The left common femoral artery and the proximal left femoral arteries are patent. The right iliac arteries and right femoral arteries are patent. The delayed images demonstrate additional contrast extravasation within the large hematoma within the anterior pelvis and lower abdomen. There is fluid tracking into the left abdomen. There is fluid and gas tracking up the left iliacus muscle. Expected gas around the left hip from the recent hip replacement. Fluid in the pelvis. Limited evaluation of pelvic structures due to the large amount of blood. The left hip arthroplasty is located. Degenerative  endplate and disc disease at L4-L5. IMPRESSION: Active bleeding in the pelvis. The bleeding is originating from the proximal left common femoral artery or the distal left external iliac artery. The bleeding is near the origin of the left inferior epigastric artery. Large amount of blood and hematoma formation throughout the abdomen and pelvis. Expected postsurgical changes from left hip arthroplasty. Electronically Signed   By: Markus Daft M.D.   On: 12/26/2015 18:55   Dg Hip Operative Unilat W Or W/o Pelvis Left  12/26/2015  CLINICAL DATA:  Left anterior hip replacement due to fracture. EXAM: OPERATIVE LEFT HIP (WITH PELVIS IF PERFORMED) 1 VIEW TECHNIQUE: Fluoroscopic spot image(s) were submitted for interpretation post-operatively. COMPARISON:  12/25/2015 FINDINGS: A total left hip arthroplasty has been performed. Alignment of the prosthesis is grossly normal on this single view. No evidence for a periprosthetic fracture. IMPRESSION: Left hip replacement without complicating features. Electronically Signed   By: Markus Daft M.D.   On: 12/26/2015 16:34   Dg Hip Unilat With  Pelvis 2-3 Views Left  12/25/2015  CLINICAL DATA:  Recent fall with hip pain, initial encounter EXAM: DG HIP (WITH OR WITHOUT PELVIS) 2-3V LEFT COMPARISON:  None. FINDINGS: There is left femoral neck fracture with impaction and angulation at the fracture site. Pelvic ring is intact. No other focal abnormality is seen. IMPRESSION: Left femoral neck fracture. Electronically Signed   By: Bonnie Salazar M.D.   On: 12/25/2015 21:16   Dg Femur 1v Left  12/25/2015  CLINICAL DATA:  [place] with left hip pain. EXAM: LEFT FEMUR 1 VIEW COMPARISON:  Left hip 12/25/2015 FINDINGS: Single view of the left femur was obtained. There is a fracture involving the proximal left femur at the junction of the femoral head and neck. There is superior displacement of the left femoral neck. Findings are suggestive for a subcapital hip fracture. The mid and distal femur appear to be intact on this single view. IMPRESSION: Fracture of the proximal left femur. Findings are compatible with a subcapital hip fracture. Electronically Signed   By: Markus Daft M.D.   On: 12/25/2015 21:17   STUDIES:  Imaging studies as above  CULTURES: None  ANTIBIOTICS: Perioperative Ancef  SIGNIFICANT EVENTS: See HPI  LINES/TUBES: L [place] 6/16 Foley 6/16  PIV ETT 6/16  DISCUSSION: Ms. Decoux is a 58F who remains intubated after a complicated L hip replacement with arterial avulsion of the left external iliac artery requiring covered stent placement by IR. She also has an evolving intraabdominal/pelvic hematoma. Currently hemodynamically stable.   ASSESSMENT / PLAN:  PULMONARY A: Need for mechanical ventilation P:   Continue ventilatory support Wean as tolerated  SBT when able  CARDIOVASCULAR A:  No acute issues P:  Monitor for hemodynamic instability Trend troponin  RENAL A:   No acute issues P:   Await repeat labs  GASTROINTESTINAL A:   Concern for abdominal compartment syndrome P:   NPO Bladder pressure  q4h Famotidine for ppx  HEMATOLOGIC A:   Arterial hemorrhage s/p stenting and requiring massive transfusion propofol P:  Trend hemoglobin q6h x 4 Transfuse as needed  INFECTIOUS A:   No acute issues - leukocytosis likely stress response S/p periop Ancef P:   Monitor  ENDOCRINE A:   No acute issues  P:     NEUROLOGIC A:   No acute issues P:   RASS goal: -1   FAMILY  - Updates: no family available  - Inter-disciplinary family meet or Palliative Care meeting due by:  day 7  The patient is critically ill with multiple organ system failure and requires high complexity decision making for assessment and support, frequent evaluation and titration of therapies, advanced monitoring, review of radiographic studies and interpretation of complex data.   Critical Care Time devoted to patient care services, exclusive of separately billable procedures, described in this note is 32 minutes.   Yisroel Ramming, MD [facility] [place] Pager: [phone]  12/26/2015, 8:46 PM

## 2015-12-26 NOTE — Progress Notes (Signed)
Initial Nutrition Assessment  DOCUMENTATION CODES:   Not applicable  INTERVENTION:  Once diet advances, provide Ensure Enlive po BID, each supplement provides 350 kcal and 20 grams of protein.  Recommend obtaining new weight and height measurement to fully assess trends.  NUTRITION DIAGNOSIS:   Increased nutrient needs related to  (surgery) as evidenced by estimated needs.  GOAL:   Patient will meet greater than or equal to 90% of their needs  MONITOR:   Supplement acceptance, Weight trends, Labs, I & O's, Diet advancement  REASON FOR ASSESSMENT:   Consult Hip fracture protocol  ASSESSMENT:   75 y.o. female who complains of Left hip pain s/p fall at home. No LOC. She tripped on a drawer. Pain was rated as a 10/10 at the time of injury. No CP, SOB. XRays show left femoral neck fracture.  Pt is currently NPO for surgery today. Pt reports more thirst than hunger. PT reports eating well at home with usual intake of at least 3 meals a day. Diet recall includes special K protein cereal and milk for breakfast, tomato sandwich for lunch, and a hearty and chunky soup for dinner. Noted, no weight or height measurement recorded. Pt reports height of 5 feet 7 inches and usual body weight of ~127 lbs. Pt was weighed on bed scale during time of visit, which revealed 134 lbs. Pt is agreeable to Ensure to aid in caloric and protein needs.   Nutrition-Focused physical exam completed. Findings are no fat depletion, moderate muscle depletion, and mild edema.   Labs and medications reviewed.   Diet Order:  Diet NPO time specified Except for: Sips with Meds Diet NPO time specified  Skin:  Reviewed, no issues  Last BM:  6/14  Height:   Ht Readings from Last 1 Encounters:  No data found for Ht  Pt reports 5 feet 7 inches.  Weight:   Wt Readings from Last 1 Encounters:  No data found for Wt  Bed scale revealed 134 lbs (60.91 kg)  Ideal Body Weight:  61.36 kg  BMI:  There is  no height or weight on file to calculate BMI.  Estimated Nutritional Needs:   Kcal:  1600-1800  Protein:  65-75 grams  Fluid:  1.6 - 1.8 L/day  EDUCATION NEEDS:   No education needs identified at this time  Corrin Parker, MS, RD, LDN Pager # [phone] After hours/ weekend pager # [phone]

## 2015-12-26 NOTE — Procedures (Signed)
Interventional Radiology Procedure Note  Procedure: US guided access right CFA.  Angiogram left lower extremity, with identification of arterial avulsion from the external iliac artery.  Covered stent placement for treatment. 12mm x 5cm viabahn.   Complications: None Recommendations:  - Right CFA 107F sheath remains.  INR 2.  Risk of bleeding with removal.  - ICU admission.  - Agree with current rescucitation.  - Serial H&H. - Will follow   Signed,  Dulcy Fanny. Earleen Newport, DO

## 2015-12-26 NOTE — Consult Note (Signed)
Reason for Consult:Post-operative hypotension and abdominal distention Referring Physician: Fredonia Highland, Eulogio Bear  Bonnie Salazar is an 75 y.o. female.  HPI: Urgent intraoperative consult  75 yo female s/p fall at home on 12/25/15.  Left hip fracture.  In OR for left anterior hip replacement by Dr. Percell Miller.  There was some intraoperative blood loss and transfusion was administered by Anesthesia.  Post-op, she was extubated, but began complaining of lower abdominal tenderness and swelling. She was tachycardic and hypotensive.  We were asked to see the patient, along with Vascular Surgery.  She is being reintubated by Anesthesia.    Past Medical History  Diagnosis Date  . Medical history non-contributory     Past Surgical History  Procedure Laterality Date  . Back surgery      Family History  Problem Relation Age of Onset  . Hodgkin's lymphoma Mother   . Stroke Father     Social History:  reports that she has quit smoking. She quit smokeless tobacco use about 20 years ago. She reports that she does not drink alcohol or use illicit drugs.  Allergies: No Known Allergies  Medications:  Prior to Admission medications   Medication Sig Start Date End Date Taking? Authorizing Provider  CALCIUM PO Take 2 tablets by mouth daily.   Yes Historical Provider, MD  chlorhexidine (PERIDEX) 0.12 % solution 15 mLs by Mouth Rinse route 2 (two) times daily. 12/22/15  Yes Historical Provider, MD  Cholecalciferol (VITAMIN D PO) Take 1 capsule by mouth daily.   Yes Historical Provider, MD  fluticasone (FLONASE) 50 MCG/ACT nasal spray Place 2 sprays into the nose daily as needed for allergies.  11/01/15  Yes Historical Provider, MD  GINKGO BILOBA PO Take 1 capsule by mouth daily.   Yes Historical Provider, MD  Misc Natural Products (GLUCOSAMINE CHONDROITIN TRIPLE) TABS Take 2 tablets by mouth daily.   Yes Historical Provider, MD  Multiple Vitamin (MULTIVITAMIN WITH MINERALS) TABS tablet Take 1 tablet  by mouth daily.   Yes Historical Provider, MD  Multiple Vitamins-Minerals (ICAPS AREDS 2) CAPS Take 1 capsule by mouth 2 (two) times daily.   Yes Historical Provider, MD  naproxen sodium (ANAPROX) 220 MG tablet Take 220 mg by mouth at bedtime as needed (pain).   Yes Historical Provider, MD  Omega-3 Fatty Acids (OMEGA 3 PO) Take 1 capsule by mouth daily.   Yes Historical Provider, MD  aspirin EC 325 MG tablet Take 1 tablet (325 mg total) by mouth daily. 12/26/15   Geradine Girt, DO  omeprazole (PRILOSEC) 20 MG capsule Take 1 capsule (20 mg total) by mouth daily. 12/26/15   Geradine Girt, DO  ondansetron (ZOFRAN) 4 MG tablet Take 1 tablet (4 mg total) by mouth every 8 (eight) hours as needed for nausea or vomiting. 12/26/15   Geradine Girt, DO  oxyCODONE-acetaminophen (ROXICET) 5-325 MG tablet Take 1-2 tablets by mouth every 4 (four) hours as needed for severe pain. 12/26/15   Geradine Girt, DO   Post-op labs pending.  Results for orders placed or performed during the hospital encounter of 12/25/15 (from the past 48 hour(s))  Basic metabolic panel     Status: Abnormal   Collection Time: 12/25/15  8:13 PM  Result Value Ref Range   Sodium 132 (L) 135 - 145 mmol/L   Potassium 4.3 3.5 - 5.1 mmol/L   Chloride 101 101 - 111 mmol/L   CO2 23 22 - 32 mmol/L   Glucose, Bld 143 (H) 65 -  99 mg/dL   BUN 21 (H) 6 - 20 mg/dL   Creatinine, Ser 0.76 0.44 - 1.00 mg/dL   Calcium 9.2 8.9 - 10.3 mg/dL   GFR calc non Af Amer >60 >60 mL/min   GFR calc Af Amer >60 >60 mL/min    Comment: (NOTE) The eGFR has been calculated using the CKD EPI equation. This calculation has not been validated in all clinical situations. eGFR's persistently <60 mL/min signify possible Chronic Kidney Disease.    Anion gap 8 5 - 15  CBC WITH DIFFERENTIAL     Status: Abnormal   Collection Time: 12/25/15  8:13 PM  Result Value Ref Range   WBC 12.6 (H) 4.0 - 10.5 K/uL   RBC 4.15 3.87 - 5.11 MIL/uL   Hemoglobin 12.0 12.0 - 15.0 g/dL    HCT 35.3 (L) 36.0 - 46.0 %   MCV 85.1 78.0 - 100.0 fL   MCH 28.9 26.0 - 34.0 pg   MCHC 34.0 30.0 - 36.0 g/dL   RDW 13.1 11.5 - 15.5 %   Platelets 246 150 - 400 K/uL   Neutrophils Relative % 85 %   Neutro Abs 10.8 (H) 1.7 - 7.7 K/uL   Lymphocytes Relative 9 %   Lymphs Abs 1.1 0.7 - 4.0 K/uL   Monocytes Relative 6 %   Monocytes Absolute 0.7 0.1 - 1.0 K/uL   Eosinophils Relative 0 %   Eosinophils Absolute 0.0 0.0 - 0.7 K/uL   Basophils Relative 0 %   Basophils Absolute 0.0 0.0 - 0.1 K/uL  Protime-INR     Status: None   Collection Time: 12/25/15  8:13 PM  Result Value Ref Range   Prothrombin Time 14.3 11.6 - 15.2 seconds   INR 1.13 0.00 - 1.49  Type and screen [place]     Status: None   Collection Time: 12/25/15  8:13 PM  Result Value Ref Range   ABO/RH(D) B POS    Antibody Screen NEG    Sample Expiration 12/28/2015   ABO/Rh     Status: None   Collection Time: 12/25/15  8:15 PM  Result Value Ref Range   ABO/RH(D) B POS   Urinalysis, Routine w reflex microscopic (not at [facility])     Status: Abnormal   Collection Time: 12/25/15  8:28 PM  Result Value Ref Range   Color, Urine YELLOW YELLOW   APPearance CLOUDY (A) CLEAR   Specific Gravity, Urine 1.016 1.005 - 1.030   pH 6.5 5.0 - 8.0   Glucose, UA NEGATIVE NEGATIVE mg/dL   Hgb urine dipstick SMALL (A) NEGATIVE   Bilirubin Urine NEGATIVE NEGATIVE   Ketones, ur >80 (A) NEGATIVE mg/dL   Protein, ur NEGATIVE NEGATIVE mg/dL   Nitrite NEGATIVE NEGATIVE   Leukocytes, UA SMALL (A) NEGATIVE  Urine microscopic-add on     Status: Abnormal   Collection Time: 12/25/15  8:28 PM  Result Value Ref Range   Squamous Epithelial / LPF 6-30 (A) NONE SEEN   WBC, UA 0-5 0 - 5 WBC/hpf   RBC / HPF 0-5 0 - 5 RBC/hpf   Bacteria, UA MANY (A) NONE SEEN  CBC     Status: Abnormal   Collection Time: 12/26/15  4:44 AM  Result Value Ref Range   WBC 9.7 4.0 - 10.5 K/uL   RBC 3.89 3.87 - 5.11 MIL/uL   Hemoglobin 10.7 (L) 12.0 -  15.0 g/dL   HCT 32.8 (L) 36.0 - 46.0 %   MCV 84.3 78.0 -  100.0 fL   MCH 27.5 26.0 - 34.0 pg   MCHC 32.6 30.0 - 36.0 g/dL   RDW 13.0 11.5 - 15.5 %   Platelets 218 150 - 400 K/uL  Basic metabolic panel     Status: Abnormal   Collection Time: 12/26/15  4:44 AM  Result Value Ref Range   Sodium 134 (L) 135 - 145 mmol/L   Potassium 4.1 3.5 - 5.1 mmol/L   Chloride 103 101 - 111 mmol/L   CO2 25 22 - 32 mmol/L   Glucose, Bld 128 (H) 65 - 99 mg/dL   BUN 15 6 - 20 mg/dL   Creatinine, Ser 0.77 0.44 - 1.00 mg/dL   Calcium 8.9 8.9 - 10.3 mg/dL   GFR calc non Af Amer >60 >60 mL/min   GFR calc Af Amer >60 >60 mL/min    Comment: (NOTE) The eGFR has been calculated using the CKD EPI equation. This calculation has not been validated in all clinical situations. eGFR's persistently <60 mL/min signify possible Chronic Kidney Disease.    Anion gap 6 5 - 15  Type and screen [place]     Status: None (Preliminary result)   Collection Time: 12/26/15  4:44 AM  Result Value Ref Range   ABO/RH(D) B POS    Antibody Screen NEG    Sample Expiration 12/29/2015    Unit Number [id number]    Blood Component Type RED CELLS,LR    Unit division 00    Status of Unit ISSUED    Transfusion Status OK TO TRANSFUSE    Crossmatch Result Compatible    Unit Number [id number]    Blood Component Type RED CELLS,LR    Unit division 00    Status of Unit ISSUED    Transfusion Status OK TO TRANSFUSE    Crossmatch Result Compatible    Unit Number [id number]    Blood Component Type RED CELLS,LR    Unit division 00    Status of Unit ISSUED    Transfusion Status OK TO TRANSFUSE    Crossmatch Result Compatible    Unit Number [id number]    Blood Component Type RED CELLS,LR    Unit division 00    Status of Unit ISSUED    Transfusion Status OK TO TRANSFUSE    Crossmatch Result Compatible    Unit Number [id number]    Blood Component Type RED CELLS,LR    Unit division 00    Status of  Unit ISSUED    Transfusion Status OK TO TRANSFUSE    Crossmatch Result Compatible    Unit Number [id number]    Blood Component Type RED CELLS,LR    Unit division 00    Status of Unit ISSUED    Transfusion Status OK TO TRANSFUSE    Crossmatch Result Compatible    Unit Number [id number]    Blood Component Type RED CELLS,LR    Unit division 00    Status of Unit ISSUED    Transfusion Status OK TO TRANSFUSE    Crossmatch Result Compatible    Unit Number [id number]    Blood Component Type RED CELLS,LR    Unit division 00    Status of Unit ISSUED    Transfusion Status OK TO TRANSFUSE    Crossmatch Result Compatible    Unit Number [id number]    Blood Component Type RED CELLS,LR    Unit division 00    Status of Unit ISSUED    Transfusion Status OK TO TRANSFUSE  Crossmatch Result Compatible    Unit Number [id number]    Blood Component Type RED CELLS,LR    Unit division 00    Status of Unit ISSUED    Transfusion Status OK TO TRANSFUSE    Crossmatch Result Compatible    Unit Number [id number]    Blood Component Type RED CELLS,LR    Unit division 00    Status of Unit ALLOCATED    Transfusion Status OK TO TRANSFUSE    Crossmatch Result Compatible    Unit Number [id number]    Blood Component Type RED CELLS,LR    Unit division 00    Status of Unit ALLOCATED    Transfusion Status OK TO TRANSFUSE    Crossmatch Result Compatible   ABO/Rh     Status: None   Collection Time: 12/26/15  4:44 AM  Result Value Ref Range   ABO/RH(D) B POS   Surgical pcr screen     Status: None   Collection Time: 12/26/15  8:27 AM  Result Value Ref Range   MRSA, PCR NEGATIVE NEGATIVE   Staphylococcus aureus NEGATIVE NEGATIVE    Comment:        The Xpert SA Assay (FDA approved for NASAL specimens in patients over 43 years of age), is one component of a comprehensive surveillance program.  Test performance has been validated by [facility] for patients greater than or equal to  65 year old. It is not intended to diagnose infection nor to guide or monitor treatment.   Prepare RBC     Status: None   Collection Time: 12/26/15  3:55 PM  Result Value Ref Range   Order Confirmation ORDER PROCESSED BY BLOOD BANK   POCT I-Stat EG7     Status: Abnormal   Collection Time: 12/26/15  4:01 PM  Result Value Ref Range   pH, Ven 7.332 (H) 7.250 - 7.300   pCO2, Ven 39.4 (L) 45.0 - 50.0 mmHg   pO2, Ven 21.0 (L) 31.0 - 45.0 mmHg   Bicarbonate 20.9 20.0 - 24.0 mEq/L   TCO2 22 0 - 100 mmol/L   O2 Saturation 31.0 %   Acid-base deficit 5.0 (H) 0.0 - 2.0 mmol/L   Sodium 137 135 - 145 mmol/L   Potassium 3.8 3.5 - 5.1 mmol/L   Calcium, Ion 1.18 1.13 - 1.30 mmol/L   HCT 25.0 (L) 36.0 - 46.0 %   Hemoglobin 8.5 (L) 12.0 - 15.0 g/dL   Patient temperature 36.8 C    Sample type VENOUS    Comment VALUES EXPECTED, NO REPEAT   I-STAT 7, (LYTES, BLD GAS, ICA, H+H)     Status: Abnormal   Collection Time: 12/26/15  4:09 PM  Result Value Ref Range   pH, Arterial 7.287 (L) 7.350 - 7.450   pCO2 arterial 36.9 35.0 - 45.0 mmHg   pO2, Arterial 441.0 (H) 80.0 - 100.0 mmHg   Bicarbonate 17.6 (L) 20.0 - 24.0 mEq/L   TCO2 19 0 - 100 mmol/L   O2 Saturation 100.0 %   Acid-base deficit 8.0 (H) 0.0 - 2.0 mmol/L   Sodium 135 135 - 145 mmol/L   Potassium 4.1 3.5 - 5.1 mmol/L   Calcium, Ion 1.13 1.13 - 1.30 mmol/L   HCT 17.0 (L) 36.0 - 46.0 %   Hemoglobin 5.8 (LL) 12.0 - 15.0 g/dL   Patient temperature 36.8 C    Sample type ARTERIAL    Comment VALUES EXPECTED, NO REPEAT   Prepare fresh frozen plasma     Status:  None (Preliminary result)   Collection Time: 12/26/15  5:28 PM  Result Value Ref Range   Unit Number [id number]    Blood Component Type THAWED PLASMA    Unit division 00    Status of Unit ISSUED    Transfusion Status OK TO TRANSFUSE    Unit Number [id number]    Blood Component Type THAWED PLASMA    Unit division 00    Status of Unit ISSUED    Transfusion Status OK TO  TRANSFUSE   Prepare Pheresed Platelets     Status: None (Preliminary result)   Collection Time: 12/26/15  5:29 PM  Result Value Ref Range   Unit Number [id number]    Blood Component Type PLTP LR1 PAS    Unit division 00    Status of Unit ISSUED    Transfusion Status OK TO TRANSFUSE     Dg Chest 1 View  12/25/2015  CLINICAL DATA:  Recent fall today with left hip pain, initial encounter EXAM: CHEST 1 VIEW COMPARISON:  None. FINDINGS: Cardiac shadow is within normal limits. The lungs are well aerated bilaterally. Mild interstitial changes are seen without focal infiltrate. No acute bony abnormality is seen. IMPRESSION: Mild interstitial changes likely of a chronic nature. No acute abnormality seen. Electronically Signed   By: Inez Catalina M.D.   On: 12/25/2015 21:17   Dg Lumbar Spine Complete  12/25/2015  CLINICAL DATA:  Fall today with low back pain, initial encounter EXAM: LUMBAR SPINE - COMPLETE 4+ VIEW COMPARISON:  None. FINDINGS: Five lumbar type vertebral bodies are well visualized. A mild scoliosis concave to the right is noted. Reactive endplate changes are noted. No anterolisthesis is seen. No soft tissue abnormality is noted. IMPRESSION: Degenerative change without acute abnormality. Electronically Signed   By: Inez Catalina M.D.   On: 12/25/2015 21:17   Dg Hip Operative Unilat W Or W/o Pelvis Left  12/26/2015  CLINICAL DATA:  Left anterior hip replacement due to fracture. EXAM: OPERATIVE LEFT HIP (WITH PELVIS IF PERFORMED) 1 VIEW TECHNIQUE: Fluoroscopic spot image(s) were submitted for interpretation post-operatively. COMPARISON:  12/25/2015 FINDINGS: A total left hip arthroplasty has been performed. Alignment of the prosthesis is grossly normal on this single view. No evidence for a periprosthetic fracture. IMPRESSION: Left hip replacement without complicating features. Electronically Signed   By: Markus Daft M.D.   On: 12/26/2015 16:34   Dg Hip Unilat With Pelvis 2-3 Views  Left  12/25/2015  CLINICAL DATA:  Recent fall with hip pain, initial encounter EXAM: DG HIP (WITH OR WITHOUT PELVIS) 2-3V LEFT COMPARISON:  None. FINDINGS: There is left femoral neck fracture with impaction and angulation at the fracture site. Pelvic ring is intact. No other focal abnormality is seen. IMPRESSION: Left femoral neck fracture. Electronically Signed   By: Inez Catalina M.D.   On: 12/25/2015 21:16   Dg Femur 1v Left  12/25/2015  CLINICAL DATA:  [place] with left hip pain. EXAM: LEFT FEMUR 1 VIEW COMPARISON:  Left hip 12/25/2015 FINDINGS: Single view of the left femur was obtained. There is a fracture involving the proximal left femur at the junction of the femoral head and neck. There is superior displacement of the left femoral neck. Findings are suggestive for a subcapital hip fracture. The mid and distal femur appear to be intact on this single view. IMPRESSION: Fracture of the proximal left femur. Findings are compatible with a subcapital hip fracture. Electronically Signed   By: Markus Daft M.D.   On: 12/25/2015 21:17  ROS Blood pressure 100/59, pulse 79, temperature 99 F (37.2 C), temperature source Oral, resp. rate 16, SpO2 91 %. Physical Exam Elderly female - very uncomfortable Abd - upper abdomen is soft, non-tender Lower abdomen is distended and firm; moderately tender; no generalized peritonitis Both lower extremities are warm with good pulses  Assessment/Plan: Probable pelvic hematoma  Recommend - immediate CT scan with contrast, followed by interventional radiology as indicated.  Dr. Hulen Skains has spoken with radiology, including IR, and they are mobilizing the IR call team.  I informed Dr. Eliseo Squires of the events and she will speak with CCM to manage the patient after Radiology.    TSUEI,MATTHEW K. 12/26/2015, 5:45 PM

## 2015-12-26 NOTE — Care Management Important Message (Signed)
Important Message  Patient Details  Name: Bonnie Salazar MRN: [id number] Date of Birth: 1940-11-18   Medicare Important Message Given:  Yes    Loann Quill 12/26/2015, 9:30 AM

## 2015-12-26 NOTE — Consult Note (Signed)
  The patient was seen is an intraoperative evaluation. The just finished the anterior  The fractured hip. Was noted to have some  hypotension and tachycardia and blood requirement.  On extubation the patient complained of abdominal pain.. On physical exam had a very tense lower abdominal exam. Did have a Foley in place which was working. Obvious was concern was for retroperitoneal bleeding.  Was also seen by general surgery with Dr. Hulen Skains. Patient was taken immediately to CT scan where CT scan showed extravasation in the left lower quadrant. She was then taken to IR where she underwent covered stent of the distal external iliac artery down to the inguinal ligament.  In the operating room she had an easily palpable popliteal pulse on the left. She was quite tachycardic.  I did review her CT films and angiomas films.  Currently on physical exam she is intubated. Abdominal abdomen is quite tense. She does have 3+ posterior tibial pulses bilaterally with no evidence of lower extremity ischemia. The right groin sheath is in place.  Impression and plan: Correction of bleeding from distal external iliac artery or branch thereof with covered stent. Following along. No evidence of distal ischemia. Concern for abdominal compartment syndrome being watched as well

## 2015-12-26 NOTE — Progress Notes (Signed)
Spoke with Dr. Halford Chessman as patient is intubated post-procedure and is needing stat CT and possible IR intervention that patient will need ICU level care.  He will follow up. Eulogio Bear DO

## 2015-12-26 NOTE — Progress Notes (Addendum)
I returned to the OR soon after extubation as she was showing abdominial distension and continued need for volume support and transfusion.  I immediately consulted Gen surg/trauma as well as vascular  Concern is for persistent internal possible retroperitoneal bleed.  She is taken to CT then IR for embolization as per their note.   MURPHY, TIMOTHY D

## 2015-12-26 NOTE — Op Note (Addendum)
12/25/2015 - 12/26/2015  4:36 PM  PATIENT:  Bonnie Salazar   MRN: [id number]  PRE-OPERATIVE DIAGNOSIS:  left hip fracture  POST-OPERATIVE DIAGNOSIS:  left hip fracture  PROCEDURE:  Procedure(s): TOTAL HIP ARTHROPLASTY ANTERIOR APPROACH  PREOPERATIVE INDICATIONS:    Bonnie Salazar is an 75 y.o. female who has a diagnosis of Fracture of femoral neck, left ([place]) and elected for surgical management after failing conservative treatment.  The risks benefits and alternatives were discussed with the patient including but not limited to the risks of nonoperative treatment, versus surgical intervention including infection, bleeding, nerve injury, periprosthetic fracture, the need for revision surgery, dislocation, leg length discrepancy, blood clots, cardiopulmonary complications, morbidity, mortality, among others, and they were willing to proceed.     OPERATIVE REPORT     SURGEON:   Delorse Shane, Ernesta Amble, MD    ASSISTANT:  Roxan Hockey, PA-C, She was present and scrubbed throughout the case, critical for completion in a timely fashion, and for retraction, instrumentation, and closure.     ANESTHESIA:  General    COMPLICATIONS:  None.     COMPONENTS:  [employer] acolade fit femur size 4 with a 36 mm -2.5 head ball and a PSL acetabular shell size 48 with a  polyethylene liner    PROCEDURE IN DETAIL:   The patient was met in the holding area and  identified.  The appropriate hip was identified and marked at the operative site.  The patient was then transported to the OR  and  placed under general anesthesia.  At that point, the patient was  placed in the supine position and  secured to the operating room table and all bony prominences padded. He received pre-operative antibiotics    The operative lower extremity was prepped from the iliac crest to the distal leg.  Sterile draping was performed.  Time out was performed prior to incision.      Skin incision was made just 2 cm lateral to  the ASIS  extending in line with the tensor fascia lata. Electrocautery was used to control all bleeders. I dissected down sharply to the fascia of the tensor fascia lata was confirmed that the muscle fibers beneath were running posteriorly. I then incised the fascia over the superficial tensor fascia lata in line with the incision. The fascia was elevated off the anterior aspect of the muscle the muscle was retracted posteriorly and protected throughout the case. I then used electrocautery to incise the tensor fascia lata fascia control and all bleeders. Immediately visible was the fat over top of the anterior neck and capsule.  I removed the anterior fat from the capsule and elevated the rectus muscle off of the anterior capsule. I then removed a large time of capsule. The retractors were then placed over the anterior acetabulum as well as around the superior and inferior neck.  I then removed a section of the femoral neck and a napkin ring fashion. Then used the power course to remove the femoral head from the acetabulum and thoroughly irrigated the acetabulum. I sized the femoral head.    I then exposed the deep acetabulum, cleared out any tissue including the ligamentum teres.   After adequate visualization, I excised the labrum, and then sequentially reamed.  I placed the trial acetabulum, which seated nicely, and then impacted the real cup into place.  Appropriate version and inclination was confirmed clinically matching their bony anatomy, and also with the use transverse acetabular ligament.  I placed a 20 mm  screw in the posterior/superio position with an excellent bite.    I then placed the polyethylene liner in place  I then abducted the leg and released the external rotators from the posterior femur allowing it to be easily delivered up lateral and anterior to the acetabulum for preparation of the femoral canal.    I then prepared the proximal femur using the cookie-cutter and then  sequentially reamed and broached.  A trial broach, neck, and head was utilized, and I reduced the hip and it was found to have excellent stability with functional range of motion..  I then impacted the real femoral prosthesis into place into the appropriate version, slightly anteverted to the normal anatomy, and I impacted the real head ball into place. The hip was then reduced and taken through functional range of motion and found to have excellent stability. Leg lengths were restored.  I then irrigated the hip copiously again with, and repaired the fascia with Vicryl, followed by monocryl for the subcutaneous tissue, Monocryl for the skin, Steri-Strips and sterile gauze.  Patient was awoken but prior to exiting the room she was noted to have discomfort and abdominal distension.   Please see further notes for remainder of course.   POST OPERATIVE PLAN: WBAT, DVT px: SCD's/TED and asa 325  She received 3 units of blood intra-op during the case  Timothy Murphy, MD Orthopedic Surgeon [phone]   This note was generated using a template and dragon dictation system. In light of that, I have reviewed the note and all aspects of it are applicable to this case. Any dictation errors are due to the computerized dictation system.     

## 2015-12-26 NOTE — Progress Notes (Addendum)
PROGRESS NOTE    Bonnie Salazar  [id number] DOB: December 14, 1940 DOA: 12/25/2015 PCP: [name]   Outpatient Specialists:     Brief Narrative:  Bonnie Salazar is a 75 y.o. woman without significant past medical history who feels that she was in her baseline state of health until she sustained an accidental fall at home. She tripped on a open drawer, causing her fall. She subsequently developed left hip pain, 10 out of 10 in intensity with any movement. No LOC. No chest pain or shortness of breath. No swelling. No palpitations. Xray reveals left femoral neck fracture. Orthopedic surgery consulted from the ED (Dr. Percell Miller).     Assessment & Plan:   Principal Problem:   Fracture of femoral neck, left (HCC) Active Problems:   Hip fracture (HCC)   Leukocytosis   Dehydration   Left femoral neck fracture after accidental fall --for surgery 6/16 --DVT prophylaxis per Ortho --Analgesics, muscle relaxers, anti-emetics as needed --Bowel regimen  Mild dehydration (low sodium, elevated BUN) --NS at 75 cc/hr --Repeat BMP in the AM  Leukocytosis --Likely acute phase reactant as normal now --Repeat CBC in the AM  ABLA vs volume dilution  -will get type and screen for AM -transfuse for < 7  DVT prophylaxis:  PER ORTHO   Code Status: Full Code   Family Communication: Husband at bedside  Disposition Plan:  PT eval   Consultants:   ortho  Procedures:        Subjective: Ready for surgery No CP, no SOB  Objective: Filed Vitals:   12/25/15 2344 12/26/15 0130 12/26/15 0253 12/26/15 1242  BP: 115/60 123/65 121/55 100/59  Pulse: 71 73 69 79  Temp:   97.7 F (36.5 C) 99 F (37.2 C)  TempSrc:   Oral Oral  Resp: 14 16 16 16   SpO2: 99% 99% 100% 91%    Intake/Output Summary (Last 24 hours) at 12/26/15 1340 Last data filed at 12/26/15 0900  Gross per 24 hour  Intake      0 ml  Output      1 ml  Net     -1 ml   There were no vitals  filed for this visit.  Examination:  General exam: Appears calm and comfortable  Respiratory system: Clear to auscultation. Respiratory effort normal. Cardiovascular system: S1 & S2 heard, RRR. No JVD, murmurs, rubs, gallops or clicks. No pedal edema. Gastrointestinal system: Abdomen is nondistended, soft and nontender. No organomegaly or masses felt. Normal bowel sounds heard. Central nervous system: Alert and oriented. No focal neurological deficits.     Data Reviewed: I have personally reviewed following labs and imaging studies  CBC:  Recent Labs Lab 12/25/15 2013 12/26/15 0444  WBC 12.6* 9.7  NEUTROABS 10.8*  --   HGB 12.0 10.7*  HCT 35.3* 32.8*  MCV 85.1 84.3  PLT 246 [id number]   Basic Metabolic Panel:  Recent Labs Lab 12/25/15 2013 12/26/15 0444  NA 132* 134*  K 4.3 4.1  CL 101 103  CO2 23 25  GLUCOSE 143* 128*  BUN 21* 15  CREATININE 0.76 0.77  CALCIUM 9.2 8.9   GFR: CrCl cannot be calculated (Unknown ideal weight.). Liver Function Tests: No results for input(s): AST, ALT, ALKPHOS, BILITOT, PROT, ALBUMIN in the last 168 hours. No results for input(s): LIPASE, AMYLASE in the last 168 hours. No results for input(s): AMMONIA in the last 168 hours. Coagulation Profile:  Recent Labs Lab 12/25/15 2013  INR 1.13   Cardiac Enzymes: No  results for input(s): CKTOTAL, CKMB, CKMBINDEX, TROPONINI in the last 168 hours. BNP (last 3 results) No results for input(s): PROBNP in the last 8760 hours. HbA1C: No results for input(s): HGBA1C in the last 72 hours. CBG: No results for input(s): GLUCAP in the last 168 hours. Lipid Profile: No results for input(s): CHOL, HDL, LDLCALC, TRIG, CHOLHDL, LDLDIRECT in the last 72 hours. Thyroid Function Tests: No results for input(s): TSH, T4TOTAL, FREET4, T3FREE, THYROIDAB in the last 72 hours. Anemia Panel: No results for input(s): VITAMINB12, FOLATE, FERRITIN, TIBC, IRON, RETICCTPCT in the last 72 hours. Urine analysis:      Component Value Date/Time   COLORURINE YELLOW 12/25/2015 2028   APPEARANCEUR CLOUDY* 12/25/2015 2028   LABSPEC 1.016 12/25/2015 2028   PHURINE 6.5 12/25/2015 2028   GLUCOSEU NEGATIVE 12/25/2015 2028   HGBUR SMALL* 12/25/2015 2028   BILIRUBINUR NEGATIVE 12/25/2015 2028   KETONESUR >80* 12/25/2015 2028   PROTEINUR NEGATIVE 12/25/2015 2028   NITRITE NEGATIVE 12/25/2015 2028   LEUKOCYTESUR SMALL* 12/25/2015 2028     ) Recent Results (from the past 240 hour(s))  Surgical pcr screen     Status: None   Collection Time: 12/26/15  8:27 AM  Result Value Ref Range Status   MRSA, PCR NEGATIVE NEGATIVE Final   Staphylococcus aureus NEGATIVE NEGATIVE Final    Comment:        The Xpert SA Assay (FDA approved for NASAL specimens in patients over 91 years of age), is one component of a comprehensive surveillance program.  Test performance has been validated by [facility] for patients greater than or equal to 33 year old. It is not intended to diagnose infection nor to guide or monitor treatment.       Anti-infectives    Start     Dose/Rate Route Frequency Ordered Stop   12/26/15 1300  ceFAZolin (ANCEF) IVPB 2g/100 mL premix     2 g 200 mL/hr over 30 Minutes Intravenous To [facility] 12/26/15 1212 12/27/15 1300       Radiology Studies: Dg Chest 1 View  12/25/2015  CLINICAL DATA:  Recent fall today with left hip pain, initial encounter EXAM: CHEST 1 VIEW COMPARISON:  None. FINDINGS: Cardiac shadow is within normal limits. The lungs are well aerated bilaterally. Mild interstitial changes are seen without focal infiltrate. No acute bony abnormality is seen. IMPRESSION: Mild interstitial changes likely of a chronic nature. No acute abnormality seen. Electronically Signed   By: Inez Catalina M.D.   On: 12/25/2015 21:17   Dg Lumbar Spine Complete  12/25/2015  CLINICAL DATA:  Fall today with low back pain, initial encounter EXAM: LUMBAR SPINE - COMPLETE 4+ VIEW COMPARISON:  None.  FINDINGS: Five lumbar type vertebral bodies are well visualized. A mild scoliosis concave to the right is noted. Reactive endplate changes are noted. No anterolisthesis is seen. No soft tissue abnormality is noted. IMPRESSION: Degenerative change without acute abnormality. Electronically Signed   By: Inez Catalina M.D.   On: 12/25/2015 21:17   Dg Hip Unilat With Pelvis 2-3 Views Left  12/25/2015  CLINICAL DATA:  Recent fall with hip pain, initial encounter EXAM: DG HIP (WITH OR WITHOUT PELVIS) 2-3V LEFT COMPARISON:  None. FINDINGS: There is left femoral neck fracture with impaction and angulation at the fracture site. Pelvic ring is intact. No other focal abnormality is seen. IMPRESSION: Left femoral neck fracture. Electronically Signed   By: Inez Catalina M.D.   On: 12/25/2015 21:16   Dg Femur 1v Left  12/25/2015  CLINICAL DATA:  [place] [address] with left hip pain. EXAM: LEFT FEMUR 1 VIEW COMPARISON:  Left hip 12/25/2015 FINDINGS: Single view of the left femur was obtained. There is a fracture involving the proximal left femur at the junction of the femoral head and neck. There is superior displacement of the left femoral neck. Findings are suggestive for a subcapital hip fracture. The mid and distal femur appear to be intact on this single view. IMPRESSION: Fracture of the proximal left femur. Findings are compatible with a subcapital hip fracture. Electronically Signed   By: Markus Daft M.D.   On: 12/25/2015 21:17        Scheduled Meds: . acetaminophen  1,000 mg Oral Once  .  ceFAZolin (ANCEF) IV  2 g Intravenous To [facility]-Surg  . chlorhexidine  60 mL Topical Once  . docusate sodium  100 mg Oral BID  . [START ON 12/27/2015] feeding supplement (ENSURE ENLIVE)  237 mL Oral BID BM  . povidone-iodine  2 application Topical Once   Continuous Infusions: . sodium chloride 75 mL/hr at 12/26/15 0252  . dextrose 5 % and 0.45% NaCl 100 mL/hr (12/26/15 1232)     LOS: 1 day    Time spent: 35 min    [place], DO [facility] Pager [phone]  If 7PM-7AM, please contact night-coverage [email or web address] Password [email or web address] 12/26/2015, 1:40 PM

## 2015-12-26 NOTE — Anesthesia Postprocedure Evaluation (Signed)
Anesthesia Post Note  Patient: Bonnie Salazar  Procedure(s) Performed: Procedure(s) (LRB): TOTAL HIP ARTHROPLASTY ANTERIOR APPROACH (Left)  Patient location during evaluation: ICU Anesthesia Type: General Level of consciousness: sedated and patient remains intubated per anesthesia plan Pain management: pain level controlled Vital Signs Assessment: post-procedure vital signs reviewed and stable Respiratory status: patient on ventilator - see flowsheet for VS Cardiovascular status: blood pressure returned to baseline Anesthetic complications: no    Last Vitals:  Filed Vitals:   12/26/15 0253 12/26/15 1242  BP: 121/55 100/59  Pulse: 69 79  Temp: 36.5 C 37.2 C  Resp: 16 16    Last Pain:  Filed Vitals:   12/26/15 2015  PainSc: Asleep                 Luka Reisch A

## 2015-12-26 NOTE — Transfer of Care (Signed)
Immediate Anesthesia Transfer of Care Note  Patient: Bonnie Salazar  Procedure(s) Performed: Procedure(s): TOTAL HIP ARTHROPLASTY ANTERIOR APPROACH (Left)  Patient Location: SICU  Anesthesia Type:General  Level of Consciousness: sedated  Airway & Oxygen Therapy: Patient remains intubated per anesthesia plan and Patient placed on Ventilator (see vital sign flow sheet for setting)  Post-op Assessment: Report given to RN and Post -op Vital signs reviewed and stable  Post vital signs: Reviewed and stable  Last Vitals:  Filed Vitals:   12/26/15 0253 12/26/15 1242  BP: 121/55 100/59  Pulse: 69 79  Temp: 36.5 C 37.2 C  Resp: 16 16    Last Pain:  Filed Vitals:   12/26/15 1243  PainSc: Asleep         Complications: No apparent anesthesia complications

## 2015-12-26 NOTE — Consult Note (Signed)
ORTHOPAEDIC CONSULTATION  REQUESTING PHYSICIAN: No att. providers found  Chief Complaint: Left hip pain s/p fall  Assessment: Principal Problem:   Hip fracture ([place]) - LEFT Active Problems:   Leukocytosis   Dehydration  Left femoral neck fracture.  Plan: Left hip hemiarthroplasty planned for 12/26/15 in the P.M. Weight Bearing Status: NWB - Will plan for WBAT post op. PT VTE px: SCD's and ASA post op.  HPI: Bonnie Salazar is a 75 y.o. female who complains of  Left hip pain s/p fall at home.  No LOC.  She tripped on a drawer.  Pain was rated as a 10/10 at the time of injury.  No CP, SOB.  XRays show left femoral neck fracture.  Orthopedics was consulted for evaluation.    Past Medical History  Diagnosis Date  . Medical history non-contributory    Past Surgical History  Procedure Laterality Date  . Back surgery     Social History   Social History  . Marital Status: Married    Spouse Name: N/A  . Number of Children: N/A  . Years of Education: N/A   Social History Main Topics  . Smoking status: Former Research scientist (life sciences)  . Smokeless tobacco: Former Systems developer    Quit date: 12/11/1995  . Alcohol Use: No  . Drug Use: No  . Sexual Activity: Not Asked   Other Topics Concern  . None   Social History Narrative  . None   Family History  Problem Relation Age of Onset  . Hodgkin's lymphoma Mother   . Stroke Father    No Known Allergies Prior to Admission medications   Medication Sig Start Date End Date Taking? Authorizing Provider  CALCIUM PO Take 2 tablets by mouth daily.   Yes Historical Provider, MD  chlorhexidine (PERIDEX) 0.12 % solution 15 mLs by Mouth Rinse route 2 (two) times daily. 12/22/15  Yes Historical Provider, MD  Cholecalciferol (VITAMIN D PO) Take 1 capsule by mouth daily.   Yes Historical Provider, MD  fluticasone (FLONASE) 50 MCG/ACT nasal spray Place 2 sprays into the nose daily as needed for allergies.  11/01/15  Yes Historical Provider, MD  GINKGO  BILOBA PO Take 1 capsule by mouth daily.   Yes Historical Provider, MD  Misc Natural Products (GLUCOSAMINE CHONDROITIN TRIPLE) TABS Take 2 tablets by mouth daily.   Yes Historical Provider, MD  Multiple Vitamin (MULTIVITAMIN WITH MINERALS) TABS tablet Take 1 tablet by mouth daily.   Yes Historical Provider, MD  Multiple Vitamins-Minerals (ICAPS AREDS 2) CAPS Take 1 capsule by mouth 2 (two) times daily.   Yes Historical Provider, MD  naproxen sodium (ANAPROX) 220 MG tablet Take 220 mg by mouth at bedtime as needed (pain).   Yes Historical Provider, MD  Omega-3 Fatty Acids (OMEGA 3 PO) Take 1 capsule by mouth daily.   Yes Historical Provider, MD   Dg Chest 1 View  12/25/2015  CLINICAL DATA:  Recent fall today with left hip pain, initial encounter EXAM: CHEST 1 VIEW COMPARISON:  None. FINDINGS: Cardiac shadow is within normal limits. The lungs are well aerated bilaterally. Mild interstitial changes are seen without focal infiltrate. No acute bony abnormality is seen. IMPRESSION: Mild interstitial changes likely of a chronic nature. No acute abnormality seen. Electronically Signed   By: Inez Catalina M.D.   On: 12/25/2015 21:17   Dg Lumbar Spine Complete  12/25/2015  CLINICAL DATA:  Fall today with low back pain, initial encounter EXAM: LUMBAR SPINE - COMPLETE 4+ VIEW  COMPARISON:  None. FINDINGS: Five lumbar type vertebral bodies are well visualized. A mild scoliosis concave to the right is noted. Reactive endplate changes are noted. No anterolisthesis is seen. No soft tissue abnormality is noted. IMPRESSION: Degenerative change without acute abnormality. Electronically Signed   By: Inez Catalina M.D.   On: 12/25/2015 21:17   Dg Hip Unilat With Pelvis 2-3 Views Left  12/25/2015  CLINICAL DATA:  Recent fall with hip pain, initial encounter EXAM: DG HIP (WITH OR WITHOUT PELVIS) 2-3V LEFT COMPARISON:  None. FINDINGS: There is left femoral neck fracture with impaction and angulation at the fracture site. Pelvic  ring is intact. No other focal abnormality is seen. IMPRESSION: Left femoral neck fracture. Electronically Signed   By: Inez Catalina M.D.   On: 12/25/2015 21:16   Dg Femur 1v Left  12/25/2015  CLINICAL DATA:  [place] [address] with left hip pain. EXAM: LEFT FEMUR 1 VIEW COMPARISON:  Left hip 12/25/2015 FINDINGS: Single view of the left femur was obtained. There is a fracture involving the proximal left femur at the junction of the femoral head and neck. There is superior displacement of the left femoral neck. Findings are suggestive for a subcapital hip fracture. The mid and distal femur appear to be intact on this single view. IMPRESSION: Fracture of the proximal left femur. Findings are compatible with a subcapital hip fracture. Electronically Signed   By: Markus Daft M.D.   On: 12/25/2015 21:17    Positive ROS: All other systems have been reviewed and were otherwise negative with the exception of those mentioned in the HPI and as above.  Objective: Labs cbc  Recent Labs  12/25/15 2013 12/26/15 0444  WBC 12.6* 9.7  HGB 12.0 10.7*  HCT 35.3* 32.8*  PLT 246 218    Recent Labs  12/25/15 2013 12/26/15 0444  NA 132* 134*  K 4.3 4.1  CL 101 103  CO2 23 25  GLUCOSE 143* 128*  BUN 21* 15  CREATININE 0.76 0.77  CALCIUM 9.2 8.9    Physical Exam: Filed Vitals:   12/26/15 0130 12/26/15 0253  BP: 123/65 121/55  Pulse: 73 69  Temp:  97.7 F (36.5 C)  Resp: 16 16   General: Alert, no acute distress Cardiovascular: No pedal edema Respiratory: No cyanosis, no use of accessory musculature GI: No organomegaly, abdomen is soft and non-tender Skin: No lesions in the area of chief complaint  Neurologic: Sensation intact distally save for the below mentioned MSK exam Psychiatric: Patient is competent for consent with normal mood and affect MUSCULOSKELETAL: Left hip pain w/ movement.  Dorsiflexion/Plantarflexion/EHL/FHL intact.  Other extremities are atraumatic with painless ROM and NVI.   Prudencio Burly III PA-C 12/26/2015 6:47 AM

## 2015-12-26 NOTE — Progress Notes (Signed)
Had a long discussion with [name] and her husband. She is very active she is in a good state of health. Her fall was mechanical and she did hit the ground hard causing her femoral neck fracture. Given her active status. State of health I do think she would benefit from total total hip arthroplasty rather than hemiarthroplasty. I discussed this with her and her husband they're in agreement she would like to go forward with a total hip arthroplasty I discussed the risks and benefits of this     Bonnie Salazar D

## 2015-12-26 NOTE — Anesthesia Procedure Notes (Addendum)
Procedure Name: Intubation Date/Time: 12/26/2015 3:04 PM Performed by: Maude Leriche D Pre-anesthesia Checklist: Patient identified, Emergency Drugs available, Suction available, Patient being monitored and Timeout performed Patient Re-evaluated:Patient Re-evaluated prior to inductionOxygen Delivery Method: Circle system utilized Preoxygenation: Pre-oxygenation with 100% oxygen Intubation Type: IV induction Ventilation: Mask ventilation without difficulty Laryngoscope Size: Miller and 2 Grade View: Grade I Tube type: Oral Tube size: 7.5 mm Number of attempts: 1 Airway Equipment and Method: Stylet Placement Confirmation: ETT inserted through vocal cords under direct vision,  positive ETCO2 and breath sounds checked- equal and bilateral Secured at: 21 cm Tube secured with: Tape Dental Injury: Teeth and Oropharynx as per pre-operative assessment     Procedure Name: Intubation Date/Time: 12/26/2015 5:35 PM Performed by: Maude Leriche D Pre-anesthesia Checklist: Patient identified, Emergency Drugs available, Suction available, Patient being monitored and Timeout performed Patient Re-evaluated:Patient Re-evaluated prior to inductionOxygen Delivery Method: Circle system utilized Preoxygenation: Pre-oxygenation with 100% oxygen Intubation Type: IV induction Laryngoscope Size: Miller and 2 Grade View: Grade I Tube type: Subglottic suction tube Tube size: 7.5 mm Number of attempts: 1 Airway Equipment and Method: Stylet Placement Confirmation: ETT inserted through vocal cords under direct vision,  positive ETCO2 and breath sounds checked- equal and bilateral Secured at: 21 cm Tube secured with: Tape Dental Injury: Teeth and Oropharynx as per pre-operative assessment     Central Venous Catheter Insertion Performed by: anesthesiologist Patient location: OR. Emergency situation Position: supine Landmarks identified and Seldinger technique used Catheter size: 8 Fr Central line was  placed.Double lumen Procedure performed without using ultrasound guided technique. Attempts: 1 Following insertion, dressing applied and line sutured. Post procedure assessment: blood return through all ports, free fluid flow and no air. Patient tolerated the procedure well with no immediate complications.

## 2015-12-26 NOTE — Progress Notes (Signed)
RT called to CT to place patient on vent from OR. Placed on PRVC 500, 12, 100%, +5 for duration of scan. patient was immediately transported to IR, where CRNA placed on their vent. RT left vent in room, and informed them to call us for trasported. Will monitor as needed.

## 2015-12-26 NOTE — Interval H&P Note (Signed)
History and Physical Interval Note:  12/26/2015 1:03 PM  Bonnie Salazar  has presented today for surgery, with the diagnosis of left hip fracture  The various methods of treatment have been discussed with the patient and family. After consideration of risks, benefits and other options for treatment, the patient has consented to  Procedure(s): TOTAL HIP ARTHROPLASTY ANTERIOR APPROACH (Left) as a surgical intervention .  The patient's history has been reviewed, patient examined, no change in status, stable for surgery.  I have reviewed the patient's chart and labs.  Questions were answered to the patient's satisfaction.     Bonnie Salazar D

## 2015-12-27 ENCOUNTER — Inpatient Hospital Stay (HOSPITAL_COMMUNITY): Payer: Medicare HMO

## 2015-12-27 ENCOUNTER — Encounter (HOSPITAL_COMMUNITY): Payer: Self-pay | Admitting: Physician Assistant

## 2015-12-27 DIAGNOSIS — S72002S Fracture of unspecified part of neck of left femur, sequela: Secondary | ICD-10-CM

## 2015-12-27 DIAGNOSIS — I341 Nonrheumatic mitral (valve) prolapse: Secondary | ICD-10-CM

## 2015-12-27 DIAGNOSIS — R579 Shock, unspecified: Secondary | ICD-10-CM

## 2015-12-27 DIAGNOSIS — R58 Hemorrhage, not elsewhere classified: Secondary | ICD-10-CM | POA: Diagnosis not present

## 2015-12-27 DIAGNOSIS — G9340 Encephalopathy, unspecified: Secondary | ICD-10-CM

## 2015-12-27 DIAGNOSIS — I213 ST elevation (STEMI) myocardial infarction of unspecified site: Secondary | ICD-10-CM

## 2015-12-27 DIAGNOSIS — J9601 Acute respiratory failure with hypoxia: Secondary | ICD-10-CM

## 2015-12-27 LAB — PREPARE PLATELET PHERESIS
UNIT DIVISION: 0
Unit division: 0

## 2015-12-27 LAB — CBC
HCT: 30.7 % — ABNORMAL LOW (ref 36.0–46.0)
HEMATOCRIT: 34.3 % — AB (ref 36.0–46.0)
HEMOGLOBIN: 10.7 g/dL — AB (ref 12.0–15.0)
Hemoglobin: 11.6 g/dL — ABNORMAL LOW (ref 12.0–15.0)
MCH: 28.9 pg (ref 26.0–34.0)
MCH: 29.3 pg (ref 26.0–34.0)
MCHC: 33.8 g/dL (ref 30.0–36.0)
MCHC: 34.9 g/dL (ref 30.0–36.0)
MCV: 84.1 fL (ref 78.0–100.0)
MCV: 85.3 fL (ref 78.0–100.0)
Platelets: 75 10*3/uL — ABNORMAL LOW (ref 150–400)
Platelets: 61 10*3/uL — ABNORMAL LOW (ref 150–400)
RBC: 4.02 MIL/uL (ref 3.87–5.11)
RBC: 3.65 MIL/uL — AB (ref 3.87–5.11)
RDW: 14.9 % (ref 11.5–15.5)
RDW: 14.5 % (ref 11.5–15.5)
WBC: 7.2 10*3/uL (ref 4.0–10.5)
WBC: 8.3 10*3/uL (ref 4.0–10.5)

## 2015-12-27 LAB — COMPREHENSIVE METABOLIC PANEL
ALT: 28 U/L (ref 14–54)
ALT: 25 U/L (ref 14–54)
ANION GAP: 8 (ref 5–15)
AST: 45 U/L — ABNORMAL HIGH (ref 15–41)
AST: 51 U/L — AB (ref 15–41)
Albumin: 3.5 g/dL (ref 3.5–5.0)
Albumin: 3.1 g/dL — ABNORMAL LOW (ref 3.5–5.0)
Alkaline Phosphatase: 66 U/L (ref 38–126)
Alkaline Phosphatase: 59 U/L (ref 38–126)
Anion gap: 8 (ref 5–15)
BILIRUBIN TOTAL: 1.2 mg/dL (ref 0.3–1.2)
BUN: 14 mg/dL (ref 6–20)
BUN: 12 mg/dL (ref 6–20)
CALCIUM: 8.4 mg/dL — AB (ref 8.9–10.3)
CALCIUM: 8.9 mg/dL (ref 8.9–10.3)
CHLORIDE: 110 mmol/L (ref 101–111)
CO2: 22 mmol/L (ref 22–32)
CO2: 22 mmol/L (ref 22–32)
CREATININE: 0.75 mg/dL (ref 0.44–1.00)
Chloride: 108 mmol/L (ref 101–111)
Creatinine, Ser: 0.78 mg/dL (ref 0.44–1.00)
GFR calc Af Amer: >60 mL/min (ref >60)
GFR calc non Af Amer: >60 mL/min (ref >60)
Glucose, Bld: 196 mg/dL — ABNORMAL HIGH (ref 65–99)
Glucose, Bld: 145 mg/dL — ABNORMAL HIGH (ref 65–99)
POTASSIUM: 3.6 mmol/L (ref 3.5–5.1)
Potassium: 3.6 mmol/L (ref 3.5–5.1)
SODIUM: 140 mmol/L (ref 135–145)
Sodium: 138 mmol/L (ref 135–145)
TOTAL PROTEIN: 6 g/dL — AB (ref 6.5–8.1)
Total Bilirubin: 1.1 mg/dL (ref 0.3–1.2)
Total Protein: 5.6 g/dL — ABNORMAL LOW (ref 6.5–8.1)

## 2015-12-27 LAB — ECHOCARDIOGRAM COMPLETE
CHL CUP TV REG PEAK VELOCITY: 278 cm/s
E decel time: 173 msec
E/e' ratio: 4.83
FS: 32 % (ref 28–44)
IVS/LV PW RATIO, ED: 0.96
LA ID, A-P, ES: 33 mm
LA diam end sys: 33 mm
LA vol A4C: 58.1 ml
LADIAMINDEX: 1.76 cm/m2
LV E/e' medial: 4.83
LV SIMPSON'S DISK: 42
LV TDI E'MEDIAL: 9.9
LV dias vol: 78 mL (ref 46–106)
LV sys vol: 45 mL — AB (ref 14–42)
LVDIAVOLIN: 42 mL/m2
LVEEAVG: 4.83
LVELAT: 20.7 cm/s
LVOT VTI: 13.4 cm
LVOT area: 3.46 cm2
LVOT diameter: 21 mm
LVOT peak vel: 84.2 cm/s
LVOTSV: 46 mL
LVSYSVOLIN: 24 mL/m2
MRPISAEROA: 0.64 cm2
MV Dec: 173
MV VTI: 159 cm
MV pk A vel: 82.6 m/s
MVPG: 4 mmHg
MVPKEVEL: 100 m/s
PW: 12.2 mm — AB (ref 0.6–1.1)
RV TAPSE: 12.2 mm
Stroke v: 33 ml
TDI e' lateral: 20.7
TR max vel: 278 cm/s
WEIGHTICAEL: 2603.19 [oz_av]

## 2015-12-27 LAB — HEMOGLOBIN AND HEMATOCRIT, BLOOD
HCT: 32.3 % — ABNORMAL LOW (ref 36.0–46.0)
HEMATOCRIT: 30.5 % — AB (ref 36.0–46.0)
HEMATOCRIT: 30.2 % — AB (ref 36.0–46.0)
HEMOGLOBIN: 10.5 g/dL — AB (ref 12.0–15.0)
HEMOGLOBIN: 10.2 g/dL — AB (ref 12.0–15.0)
Hemoglobin: 11.1 g/dL — ABNORMAL LOW (ref 12.0–15.0)

## 2015-12-27 LAB — MRSA PCR SCREENING: MRSA BY PCR: NEGATIVE

## 2015-12-27 LAB — PREPARE CRYOPRECIPITATE
UNIT DIVISION: 0
Unit division: 0

## 2015-12-27 LAB — PROTIME-INR
INR: 1.35 (ref 0.00–1.49)
INR: 1.37 (ref 0.00–1.49)
PROTHROMBIN TIME: 16.8 s — AB (ref 11.6–15.2)
Prothrombin Time: 17 seconds — ABNORMAL HIGH (ref 11.6–15.2)

## 2015-12-27 LAB — TROPONIN I
TROPONIN I: 2 ng/mL — AB (ref <0.031)
TROPONIN I: 0.62 ng/mL — AB (ref <0.031)
TROPONIN I: 1.2 ng/mL — AB (ref <0.031)
Troponin I: 2.06 ng/mL (ref <0.031)

## 2015-12-27 LAB — GLUCOSE, CAPILLARY: GLUCOSE-CAPILLARY: 212 mg/dL — AB (ref 65–99)

## 2015-12-27 LAB — FIBRINOGEN: Fibrinogen: 261 mg/dL (ref 204–475)

## 2015-12-27 LAB — TRIGLYCERIDES: Triglycerides: 81 mg/dL (ref <150)

## 2015-12-27 LAB — LACTIC ACID, PLASMA: Lactic Acid, Venous: 2.4 mmol/L (ref 0.5–2.0)

## 2015-12-27 MED ORDER — HEPARIN (PORCINE) IN NACL 100-0.45 UNIT/ML-% IJ SOLN
850.0000 [IU]/h | INTRAMUSCULAR | Status: DC
  Administered 2015-12-27: 1100 [IU]/h via INTRAVENOUS
  Filled 2015-12-27: qty 250

## 2015-12-27 MED ORDER — FENTANYL CITRATE (PF) 100 MCG/2ML IJ SOLN
12.5000 ug | INTRAMUSCULAR | Status: DC | PRN
  Administered 2015-12-27 – 2015-12-28 (×3): 12.5 ug via INTRAVENOUS
  Filled 2015-12-27 (×5): qty 2

## 2015-12-27 MED ORDER — ANTISEPTIC ORAL RINSE SOLUTION (CORINZ)
7.0000 mL | Freq: Four times a day (QID) | OROMUCOSAL | Status: DC
  Administered 2015-12-27 (×2): 7 mL via OROMUCOSAL

## 2015-12-27 MED ORDER — IOPAMIDOL (ISOVUE-370) INJECTION 76%
INTRAVENOUS | Status: AC
  Administered 2015-12-27: 80 mL
  Filled 2015-12-27: qty 100

## 2015-12-27 MED ORDER — LORAZEPAM 2 MG/ML IJ SOLN
0.2500 mg | Freq: Once | INTRAMUSCULAR | Status: AC
  Administered 2015-12-27: 0.25 mg via INTRAVENOUS
  Filled 2015-12-27: qty 1

## 2015-12-27 MED ORDER — POTASSIUM CHLORIDE 10 MEQ/50ML IV SOLN
10.0000 meq | INTRAVENOUS | Status: AC
  Administered 2015-12-27: 10 meq via INTRAVENOUS
  Filled 2015-12-27: qty 50

## 2015-12-27 MED ORDER — METOPROLOL TARTRATE 5 MG/5ML IV SOLN
INTRAVENOUS | Status: AC
Start: 2015-12-27 — End: 2015-12-27
  Filled 2015-12-27: qty 5

## 2015-12-27 MED ORDER — METOPROLOL TARTRATE 5 MG/5ML IV SOLN
2.5000 mg | INTRAVENOUS | Status: DC
  Administered 2015-12-27 – 2015-12-29 (×16): 2.5 mg via INTRAVENOUS
  Filled 2015-12-27 (×18): qty 5

## 2015-12-27 MED ORDER — CHLORHEXIDINE GLUCONATE 0.12% ORAL RINSE (MEDLINE KIT)
15.0000 mL | Freq: Two times a day (BID) | OROMUCOSAL | Status: DC
  Administered 2015-12-27 – 2016-01-10 (×30): 15 mL via OROMUCOSAL

## 2015-12-27 NOTE — Progress Notes (Signed)
Referring Physician(s): Dr. Verita Lamb  Supervising Physician: Arne Cleveland  Patient Status:  Inpatient    Subjective: Pt s/p angio with identification of arterial avulsion from the (L) external iliac artery. Covered stent placement for treatment. 40m x 5cm viabahn. Pt just extubated. Still quite uncomfortable in her abdomen Denies leg pain Husband at bedside  Allergies: Review of patient's allergies indicates no known allergies.  Medications:  Current facility-administered medications:  .  0.9 %  sodium chloride infusion, , Intravenous, Continuous, NLily Kocher MD, Last Rate: 75 mL/hr at 12/26/15 2219 .  acetaminophen (TYLENOL) tablet 650 mg, 650 mg, Oral, Q6H PRN, NLily Kocher MD .  antiseptic oral rinse solution (CORINZ), 7 mL, Mouth Rinse, QID, WRush Farmer MD, 7 mL at 12/27/15 0348 .  aspirin EC tablet 325 mg, 325 mg, Oral, Daily, Jessica U Vann, DO, 325 mg at 12/27/15 1000 .  chlorhexidine gluconate (SAGE KIT) (PERIDEX) 0.12 % solution 15 mL, 15 mL, Mouth Rinse, BID, WRush Farmer MD, 15 mL at 12/27/15 0747 .  docusate sodium (COLACE) capsule 100 mg, 100 mg, Oral, BID, NLily Kocher MD, 100 mg at 12/26/15 0243 .  famotidine (PEPCID) IVPB 20 mg premix, 20 mg, Intravenous, Q12H, VChesley Mires MD, 20 mg at 12/27/15 0001 .  feeding supplement (ENSURE ENLIVE) (ENSURE ENLIVE) liquid 237 mL, 237 mL, Oral, BID BM, Jessica U Vann, DO .  fentaNYL (SUBLIMAZE) injection 12.5 mcg, 12.5 mcg, Intravenous, Q2H PRN, PErick Colace NP .  lactated ringers infusion, , Intravenous, Continuous, FLyn Hollingshead MD, Last Rate: 50 mL/hr at 12/26/15 1412 .  [DISCONTINUED] metoCLOPramide (REGLAN) tablet 5-10 mg, 5-10 mg, Oral, Q8H PRN **OR** metoCLOPramide (REGLAN) injection 5-10 mg, 5-10 mg, Intravenous, Q8H PRN, JGeradine Girt DO .  metoprolol (LOPRESSOR) 5 MG/5ML injection, , , ,  .  metoprolol (LOPRESSOR) injection 2.5 mg, 2.5 mg, Intravenous, Q3H, PErick Colace NP, 2.5 mg at  12/27/15 0819 .  ondansetron (ZOFRAN) tablet 4 mg, 4 mg, Oral, Q6H PRN **OR** ondansetron (ZOFRAN) injection 4 mg, 4 mg, Intravenous, Q6H PRN, JTomi BambergerVann, DO .  tranexamic acid (CYKLOKAPRON) 2,000 mg in sodium chloride 0.9 % 50 mL Topical Application, 27,989mg, Topical, STAT, TRenette Butters MD    Vital Signs: BP 116/70 mmHg  Pulse 109  Temp(Src) 98.3 F (36.8 C) (Oral)  Resp 18  Wt 162 lb 11.2 oz (73.8 kg)  SpO2 96%  Physical Exam Extubated but awake and alert, voice hoarse Abd: softer than last pm, still distended Ext: (R)groin sheath just removed, Exoceal placed, pressure being held. (L)groin soft. Bilat PT pulses palpable   Imaging:  [place]  12/26/2015  INDICATION: 75year old female with a history of a hip arthroplasty and hypotension. EXAM: IR EMBO ART VEN HEMORR LYMPH EXTRAV INC GUIDE ROADMAPPING; IR ULTRASOUND GUIDANCE VASC ACCESS RIGHT; ARTERIOGRAPHY; PELVIC SELECTIVE ARTERIOGRAPHY; LEFT EXTREMITY ARTERIOGRAPHY; ADDITIONAL ARTERIOGRAPHY MEDICATIONS: None ANESTHESIA/SEDATION: Patient was intubated. Anesthesia team managed the endotracheal airway CONTRAST:  50 cc Isovue FLUOROSCOPY TIME:  Fluoroscopy Time: 5 minutes 42 seconds COMPLICATIONS: None PROCEDURE: Informed consent was obtained from the patient following explanation of the procedure, risks, benefits and alternatives. The patient understands, agrees and consents for the procedure. All questions were addressed. A time out was performed prior to the initiation of the procedure. Maximal barrier sterile technique utilized including caps, mask, sterile gowns, sterile gloves, large sterile drape, hand hygiene, and Betadine prep. Patient positioned supine position  on the fluoroscopy table. The right inguinal region was prepped and draped in the usual sterile fashion. Ultrasound survey of the right inguinal region was performed with images stored and sent to PACs. A  micropuncture needle was used access the right common femoral artery under ultrasound. With excellent arterial blood flow returned, and an .018 micro wire was passed through the needle, observed enter the abdominal aorta under fluoroscopy. The needle was removed, and a micropuncture sheath was placed over the wire. The inner dilator and wire were removed, and an 035 Bentson wire was advanced under fluoroscopy into the abdominal aorta. The sheath was removed and a standard 5 [place] vascular sheath was placed. The dilator was removed and the sheath was flushed. Omni Flush catheter was used to navigate a Bentson wire over the aortic bifurcation into the left iliac system. Catheter was exchanged for a C2 catheter advanced into the hypogastric artery with an angiogram performed. Catheter was withdrawn and redirected into the external iliac artery. Angiogram was performed. A Rosen wire was advanced into the proximal femoral system and the catheter was withdrawn. Six French right tip sheath was advanced over the bifurcation into the left iliac system. 7 mm x 4 cm balloon was advanced over the Rosen wire into the proximal left external iliac artery. Balloon was gently inflated to low atmospheric pressure, with inflation of approximately 4 minutes. At this time, measurements of the left external iliac artery were performed. Balloon was deflated, the balloon catheter was advanced into the proximal left femoral system, and the 035 Rosen wire was exchanged for an 018 S-V8 wire. Balloon catheter was removed. A 75m x 524mViabahn stent graft was select thinned and advanced over the 018 wire to the targeted the avulsed artery. Stent graft was deployed. Balloon angioplasty to the nominal diameter of 6 mm was performed. Final angiogram was performed. The 6 French 35 cm right tip sheath was exchanged for a standard 10 cm 6 French sheath. Sheath was elected to remain given the patient's coagulopathy. Patient tolerated the procedure  well with no significant blood loss. IMPRESSION: Status post left lower extremity angiogram with emergent covered stent embolization of distal external iliac artery branch vessel pseudoaneurysm/avulsion, with placement of stent graft. Signed, JaDulcy FannyWaEarleen NewportDO [facility] GrThe [facility] Center[facility]iology Electronically Signed   By: JaCorrie Mckusick.O.   On: 12/26/2015 22:45    Labs:  CBC:  Recent Labs  12/26/15 0444  12/26/15 1740 12/26/15 1855 12/27/15 12/27/15 0400 12/27/15 0743  WBC 9.7  --  20.3*  --  8.3  --  7.2  HGB 10.7*  < > 10.7* 11.2* 11.6* 11.1* 10.7*  HCT 32.8*  < > 31.6* 33.0* 34.3* 32.3* 30.7*  PLT 218  --  87*  --  75*  --  61*  < > = values in this interval not displayed.  COAGS:  Recent Labs  12/25/15 2013 12/26/15 1740 12/27/15 12/27/15 0400  INR 1.13 2.01* 1.35 1.37  APTT  --  29  --   --     BMP:  Recent Labs  12/25/15 2013 12/26/15 0444  12/26/15 1720 12/26/15 1855 12/27/15 12/27/15 0400  NA 132* 134*  < > 139 140 138 140  K 4.3 4.1  < > 3.9 5.1 3.6 3.6  CL 101 103  --   --   --  108 110  CO2 23 25  --   --   --  22 22  GLUCOSE 143* 128*  --   --   --  196* 145*  BUN 21* 15  --   --   --  12 14  CALCIUM 9.2 8.9  --   --   --  8.9 8.4*  CREATININE 0.76 0.77  --   --   --  0.75 0.78  GFRNONAA >60 >60  --   --   --  >60 >60  GFRAA >60 >60  --   --   --  >60 >60  < > = values in this interval not displayed.  LIVER FUNCTION TESTS:  Recent Labs  12/27/15 12/27/15 0400  BILITOT 1.2 1.1  AST 51* 45*  ALT 28 25  ALKPHOS 66 59  PROT 6.0* 5.6*  ALBUMIN 3.5 3.1*    Assessment and Plan: S/p angio with identification of arterial avulsion from the (L)external iliac artery. Covered stent placement for treatment. 58m x 5cm viabahn. Extubated Hgb stable, Cr stable Excellent pedal pulses IR following along  Electronically Signed: Henslee Lottman 12/27/2015, 10:04 AM   I spent a total of 20 minutes at the the  patient's bedside AND on the patient's hospital floor or unit, greater than 50% of which was counseling/coordinating care for (L)EIA hemorrhage

## 2015-12-27 NOTE — Progress Notes (Signed)
PT Cancellation Note  Patient Details Name: BAILEE MARTINE MRN: [id number] DOB: 05/24/1941   Cancelled Treatment:    Reason Eval/Treat Not Completed: Patient not medically ready   Spoke with RN and pt now diagnosed with "large clot in her atrium" with Cardiology currently in room consulting with patient. Will follow-up 6/18 as schedule permits. (? Will be able to begin bed exercises)   Karlissa Aron 12/27/2015, 3:54 PM  Pager [phone]

## 2015-12-27 NOTE — Progress Notes (Signed)
Subjective: Interval History: none.. Alert on vent this morning. Very anxious. Agitated and tachycardic currently.   Objective: Vital signs in last 24 hours: Temp:  [97.4 F (36.3 C)-99 F (37.2 C)] 98.3 F (36.8 C) (06/17 0724) Pulse Rate:  [64-110] 96 (06/17 0700) Resp:  [14-22] 14 (06/17 0700) BP: (95-141)/(59-113) 95/69 mmHg (06/17 0700) SpO2:  [91 %-100 %] 97 % (06/17 0700) Arterial Line BP: (103-174)/(53-93) 103/54 mmHg (06/17 0700) FiO2 (%):  [50 %-80 %] 50 % (06/17 0339) Weight:  [162 lb 11.2 oz (73.8 kg)] 162 lb 11.2 oz (73.8 kg) (06/16 2100)  Intake/Output from previous day: 06/16 0701 - 06/17 0700 In: [id number] [I.V.:8219.7; [id number]; IV [id number] Out: 2251 [Urine:1150; Stool:1; Blood:1100] Intake/Output this shift: Total I/O In: 3.3 [I.V.:3.3] Out: -   Lower abdomen distended but much softer than yesterday evening. Is not appear to be tender this morning where it was exquisitely tender and tense yesterday evening. 2+ posterior tibial pulses bilaterally.  Lab Results:  Recent Labs  12/26/15 1740  12/27/15 12/27/15 0400  WBC 20.3*  --  8.3  --   HGB 10.7*  < > 11.6* 11.1*  HCT 31.6*  < > 34.3* 32.3*  PLT 87*  --  75*  --   < > = values in this interval not displayed. BMET  Recent Labs  12/27/15 12/27/15 0400  NA 138 140  K 3.6 3.6  CL 108 110  CO2 22 22  GLUCOSE 196* 145*  BUN 12 14  CREATININE 0.75 0.78  CALCIUM 8.9 8.4*    Studies/Results: Dg Chest 1 View  12/25/2015  CLINICAL DATA:  Recent fall today with left hip pain, initial encounter EXAM: CHEST 1 VIEW COMPARISON:  None. FINDINGS: Cardiac shadow is within normal limits. The lungs are well aerated bilaterally. Mild interstitial changes are seen without focal infiltrate. No acute bony abnormality is seen. IMPRESSION: Mild interstitial changes likely of a chronic nature. No acute abnormality seen. Electronically Signed   By: Inez Catalina M.D.   On: 12/25/2015 21:17   Dg Lumbar Spine  Complete  12/25/2015  CLINICAL DATA:  Fall today with low back pain, initial encounter EXAM: LUMBAR SPINE - COMPLETE 4+ VIEW COMPARISON:  None. FINDINGS: Five lumbar type vertebral bodies are well visualized. A mild scoliosis concave to the right is noted. Reactive endplate changes are noted. No anterolisthesis is seen. No soft tissue abnormality is noted. IMPRESSION: Degenerative change without acute abnormality. Electronically Signed   By: Inez Catalina M.D.   On: 12/25/2015 21:17   Ct Pelvis W Contrast  12/26/2015  CLINICAL DATA:  75 year old with large pelvic hematoma following left hip replacement. Evaluate for pelvic bleeding. EXAM: CT PELVIS WITH CONTRAST TECHNIQUE: Multidetector CT imaging of the pelvis was performed using the standard protocol following the bolus administration of intravenous contrast. CONTRAST:  100 mL Isovue COMPARISON:  None. FINDINGS: Vascular structures: The distal abdominal aorta and common iliac arteries are patent. There is active contrast extravasation in the left hemipelvis originating from the proximal left common femoral artery or distal left external iliac artery. Bleeding is originating near the origin of the left inferior epigastric artery but may be separate from this vessel. The left common femoral artery and the proximal left femoral arteries are patent. The right iliac arteries and right femoral arteries are patent. The delayed images demonstrate additional contrast extravasation within the large hematoma within the anterior pelvis and lower abdomen. There is fluid tracking into the left abdomen. There is fluid and gas tracking  up the left iliacus muscle. Expected gas around the left hip from the recent hip replacement. Fluid in the pelvis. Limited evaluation of pelvic structures due to the large amount of blood. The left hip arthroplasty is located. 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 IMPRESSION: Active bleeding in the pelvis. The bleeding is originating  from the proximal left common femoral artery or the distal left external iliac artery. The bleeding is near the origin of the left inferior epigastric artery. Large amount of blood and hematoma formation throughout the abdomen and pelvis. Expected postsurgical changes from left hip arthroplasty. Electronically Signed   By: Markus Daft M.D.   On: 12/26/2015 18:55   Ir Angiogram Extremity Left  12/26/2015  INDICATION: 75 year old female with a history of a hip arthroplasty and hypotension. EXAM: IR EMBO ART VEN HEMORR LYMPH EXTRAV INC GUIDE ROADMAPPING; IR ULTRASOUND GUIDANCE VASC ACCESS RIGHT; ARTERIOGRAPHY; PELVIC SELECTIVE ARTERIOGRAPHY; LEFT EXTREMITY ARTERIOGRAPHY; ADDITIONAL ARTERIOGRAPHY MEDICATIONS: None ANESTHESIA/SEDATION: Patient was intubated. Anesthesia team managed the endotracheal airway CONTRAST:  50 cc Isovue FLUOROSCOPY TIME:  Fluoroscopy Time: 5 minutes 42 seconds COMPLICATIONS: None PROCEDURE: Informed consent was obtained from the patient following explanation of the procedure, risks, benefits and alternatives. The patient understands, agrees and consents for the procedure. All questions were addressed. A time out was performed prior to the initiation of the procedure. Maximal barrier sterile technique utilized including caps, mask, sterile gowns, sterile gloves, large sterile drape, hand hygiene, and Betadine prep. Patient positioned supine position on the fluoroscopy table. The right inguinal region was prepped and draped in the usual sterile fashion. Ultrasound survey of the right inguinal region was performed with images stored and sent to PACs. A micropuncture needle was used access the right common femoral artery under ultrasound. With excellent arterial blood flow returned, and an .018 micro wire was passed through the needle, observed enter the abdominal aorta under fluoroscopy. The needle was removed, and a micropuncture sheath was placed over the wire. The inner dilator and wire were  removed, and an 035 Bentson wire was advanced under fluoroscopy into the abdominal aorta. The sheath was removed and a standard 5 [place] vascular sheath was placed. The dilator was removed and the sheath was flushed. Omni Flush catheter was used to navigate a Bentson wire over the aortic bifurcation into the left iliac system. Catheter was exchanged for a C2 catheter advanced into the hypogastric artery with an angiogram performed. Catheter was withdrawn and redirected into the external iliac artery. Angiogram was performed. A Rosen wire was advanced into the proximal femoral system and the catheter was withdrawn. Six French right tip sheath was advanced over the bifurcation into the left iliac system. 7 mm x 4 cm balloon was advanced over the Rosen wire into the proximal left external iliac artery. Balloon was gently inflated to low atmospheric pressure, with inflation of approximately 4 minutes. At this time, measurements of the left external iliac artery were performed. Balloon was deflated, the balloon catheter was advanced into the proximal left femoral system, and the 035 Rosen wire was exchanged for an 018 S-V8 wire. Balloon catheter was removed. A 73mm x 73mm Viabahn stent graft was select thinned and advanced over the 018 wire to the targeted the avulsed artery. Stent graft was deployed. Balloon angioplasty to the nominal diameter of 6 mm was performed. Final angiogram was performed. The 6 French 35 cm right tip sheath was exchanged for a standard 10 cm 6 French sheath. Sheath was elected to remain given the  patient's coagulopathy. Patient tolerated the procedure well with no significant blood loss. IMPRESSION: Status post left lower extremity angiogram with emergent covered stent embolization of distal external iliac artery branch vessel pseudoaneurysm/avulsion, with placement of stent graft. Signed, Dulcy Fanny. Earleen Newport, DO [facility] Radiology Electronically  Signed   By: Corrie Mckusick D.O.   On: 12/26/2015 22:45   Ir Angiogram Pelvis Selective Or Supraselective  12/26/2015  INDICATION: 75 year old female with a history of a hip arthroplasty and hypotension. EXAM: IR EMBO ART VEN HEMORR LYMPH EXTRAV INC GUIDE ROADMAPPING; IR ULTRASOUND GUIDANCE VASC ACCESS RIGHT; ARTERIOGRAPHY; PELVIC SELECTIVE ARTERIOGRAPHY; LEFT EXTREMITY ARTERIOGRAPHY; ADDITIONAL ARTERIOGRAPHY MEDICATIONS: None ANESTHESIA/SEDATION: Patient was intubated. Anesthesia team managed the endotracheal airway CONTRAST:  50 cc Isovue FLUOROSCOPY TIME:  Fluoroscopy Time: 5 minutes 42 seconds COMPLICATIONS: None PROCEDURE: Informed consent was obtained from the patient following explanation of the procedure, risks, benefits and alternatives. The patient understands, agrees and consents for the procedure. All questions were addressed. A time out was performed prior to the initiation of the procedure. Maximal barrier sterile technique utilized including caps, mask, sterile gowns, sterile gloves, large sterile drape, hand hygiene, and Betadine prep. Patient positioned supine position on the fluoroscopy table. The right inguinal region was prepped and draped in the usual sterile fashion. Ultrasound survey of the right inguinal region was performed with images stored and sent to PACs. A micropuncture needle was used access the right common femoral artery under ultrasound. With excellent arterial blood flow returned, and an .018 micro wire was passed through the needle, observed enter the abdominal aorta under fluoroscopy. The needle was removed, and a micropuncture sheath was placed over the wire. The inner dilator and wire were removed, and an 035 Bentson wire was advanced under fluoroscopy into the abdominal aorta. The sheath was removed and a standard 5 [place] vascular sheath was placed. The dilator was removed and the sheath was flushed. Omni Flush catheter was used to navigate a Bentson wire over the aortic  bifurcation into the left iliac system. Catheter was exchanged for a C2 catheter advanced into the hypogastric artery with an angiogram performed. Catheter was withdrawn and redirected into the external iliac artery. Angiogram was performed. A Rosen wire was advanced into the proximal femoral system and the catheter was withdrawn. Six French right tip sheath was advanced over the bifurcation into the left iliac system. 7 mm x 4 cm balloon was advanced over the Rosen wire into the proximal left external iliac artery. Balloon was gently inflated to low atmospheric pressure, with inflation of approximately 4 minutes. At this time, measurements of the left external iliac artery were performed. Balloon was deflated, the balloon catheter was advanced into the proximal left femoral system, and the 035 Rosen wire was exchanged for an 018 S-V8 wire. Balloon catheter was removed. A 48mm x 15mm Viabahn stent graft was select thinned and advanced over the 018 wire to the targeted the avulsed artery. Stent graft was deployed. Balloon angioplasty to the nominal diameter of 6 mm was performed. Final angiogram was performed. The 6 French 35 cm right tip sheath was exchanged for a standard 10 cm 6 French sheath. Sheath was elected to remain given the patient's coagulopathy. Patient tolerated the procedure well with no significant blood loss. IMPRESSION: Status post left lower extremity angiogram with emergent covered stent embolization of distal external iliac artery branch vessel pseudoaneurysm/avulsion, with placement of stent graft. Signed, Dulcy Fanny. Earleen Newport, DO [facility] Radiology  Electronically Signed   By: Corrie Mckusick D.O.   On: 12/26/2015 22:45   Ir Angiogram Selective Each Additional Vessel  12/26/2015  INDICATION: 75 year old female with a history of a hip arthroplasty and hypotension. EXAM: IR EMBO ART VEN HEMORR LYMPH EXTRAV INC GUIDE ROADMAPPING; IR ULTRASOUND GUIDANCE  VASC ACCESS RIGHT; ARTERIOGRAPHY; PELVIC SELECTIVE ARTERIOGRAPHY; LEFT EXTREMITY ARTERIOGRAPHY; ADDITIONAL ARTERIOGRAPHY MEDICATIONS: None ANESTHESIA/SEDATION: Patient was intubated. Anesthesia team managed the endotracheal airway CONTRAST:  50 cc Isovue FLUOROSCOPY TIME:  Fluoroscopy Time: 5 minutes 42 seconds COMPLICATIONS: None PROCEDURE: Informed consent was obtained from the patient following explanation of the procedure, risks, benefits and alternatives. The patient understands, agrees and consents for the procedure. All questions were addressed. A time out was performed prior to the initiation of the procedure. Maximal barrier sterile technique utilized including caps, mask, sterile gowns, sterile gloves, large sterile drape, hand hygiene, and Betadine prep. Patient positioned supine position on the fluoroscopy table. The right inguinal region was prepped and draped in the usual sterile fashion. Ultrasound survey of the right inguinal region was performed with images stored and sent to PACs. A micropuncture needle was used access the right common femoral artery under ultrasound. With excellent arterial blood flow returned, and an .018 micro wire was passed through the needle, observed enter the abdominal aorta under fluoroscopy. The needle was removed, and a micropuncture sheath was placed over the wire. The inner dilator and wire were removed, and an 035 Bentson wire was advanced under fluoroscopy into the abdominal aorta. The sheath was removed and a standard 5 [place] vascular sheath was placed. The dilator was removed and the sheath was flushed. Omni Flush catheter was used to navigate a Bentson wire over the aortic bifurcation into the left iliac system. Catheter was exchanged for a C2 catheter advanced into the hypogastric artery with an angiogram performed. Catheter was withdrawn and redirected into the external iliac artery. Angiogram was performed. A Rosen wire was advanced into the proximal femoral  system and the catheter was withdrawn. Six French right tip sheath was advanced over the bifurcation into the left iliac system. 7 mm x 4 cm balloon was advanced over the Rosen wire into the proximal left external iliac artery. Balloon was gently inflated to low atmospheric pressure, with inflation of approximately 4 minutes. At this time, measurements of the left external iliac artery were performed. Balloon was deflated, the balloon catheter was advanced into the proximal left femoral system, and the 035 Rosen wire was exchanged for an 018 S-V8 wire. Balloon catheter was removed. A 6mm x 2mm Viabahn stent graft was select thinned and advanced over the 018 wire to the targeted the avulsed artery. Stent graft was deployed. Balloon angioplasty to the nominal diameter of 6 mm was performed. Final angiogram was performed. The 6 French 35 cm right tip sheath was exchanged for a standard 10 cm 6 French sheath. Sheath was elected to remain given the patient's coagulopathy. Patient tolerated the procedure well with no significant blood loss. IMPRESSION: Status post left lower extremity angiogram with emergent covered stent embolization of distal external iliac artery branch vessel pseudoaneurysm/avulsion, with placement of stent graft. Signed, Dulcy Fanny. Earleen Newport, DO [facility] Radiology Electronically Signed   By: Corrie Mckusick D.O.   On: 12/26/2015 22:45   Ir Angiogram Follow Up Study  12/26/2015  INDICATION: 76 year old female with a history of a hip arthroplasty and hypotension. EXAM: IR EMBO ART VEN HEMORR LYMPH EXTRAV INC GUIDE ROADMAPPING; IR ULTRASOUND  GUIDANCE VASC ACCESS RIGHT; ARTERIOGRAPHY; PELVIC SELECTIVE ARTERIOGRAPHY; LEFT EXTREMITY ARTERIOGRAPHY; ADDITIONAL ARTERIOGRAPHY MEDICATIONS: None ANESTHESIA/SEDATION: Patient was intubated. Anesthesia team managed the endotracheal airway CONTRAST:  50 cc Isovue FLUOROSCOPY TIME:  Fluoroscopy Time: 5 minutes 42  seconds COMPLICATIONS: None PROCEDURE: Informed consent was obtained from the patient following explanation of the procedure, risks, benefits and alternatives. The patient understands, agrees and consents for the procedure. All questions were addressed. A time out was performed prior to the initiation of the procedure. Maximal barrier sterile technique utilized including caps, mask, sterile gowns, sterile gloves, large sterile drape, hand hygiene, and Betadine prep. Patient positioned supine position on the fluoroscopy table. The right inguinal region was prepped and draped in the usual sterile fashion. Ultrasound survey of the right inguinal region was performed with images stored and sent to PACs. A micropuncture needle was used access the right common femoral artery under ultrasound. With excellent arterial blood flow returned, and an .018 micro wire was passed through the needle, observed enter the abdominal aorta under fluoroscopy. The needle was removed, and a micropuncture sheath was placed over the wire. The inner dilator and wire were removed, and an 035 Bentson wire was advanced under fluoroscopy into the abdominal aorta. The sheath was removed and a standard 5 [place] vascular sheath was placed. The dilator was removed and the sheath was flushed. Omni Flush catheter was used to navigate a Bentson wire over the aortic bifurcation into the left iliac system. Catheter was exchanged for a C2 catheter advanced into the hypogastric artery with an angiogram performed. Catheter was withdrawn and redirected into the external iliac artery. Angiogram was performed. A Rosen wire was advanced into the proximal femoral system and the catheter was withdrawn. Six French right tip sheath was advanced over the bifurcation into the left iliac system. 7 mm x 4 cm balloon was advanced over the Rosen wire into the proximal left external iliac artery. Balloon was gently inflated to low atmospheric pressure, with inflation of  approximately 4 minutes. At this time, measurements of the left external iliac artery were performed. Balloon was deflated, the balloon catheter was advanced into the proximal left femoral system, and the 035 Rosen wire was exchanged for an 018 S-V8 wire. Balloon catheter was removed. A 60mm x 49mm Viabahn stent graft was select thinned and advanced over the 018 wire to the targeted the avulsed artery. Stent graft was deployed. Balloon angioplasty to the nominal diameter of 6 mm was performed. Final angiogram was performed. The 6 French 35 cm right tip sheath was exchanged for a standard 10 cm 6 French sheath. Sheath was elected to remain given the patient's coagulopathy. Patient tolerated the procedure well with no significant blood loss. IMPRESSION: Status post left lower extremity angiogram with emergent covered stent embolization of distal external iliac artery branch vessel pseudoaneurysm/avulsion, with placement of stent graft. Signed, Dulcy Fanny. Earleen Newport, DO [facility] Radiology Electronically Signed   By: Corrie Mckusick D.O.   On: 12/26/2015 22:45   Ir US Guide Vasc Access Right  12/26/2015  INDICATION: 75 year old female with a history of a hip arthroplasty and hypotension. EXAM: IR EMBO ART VEN HEMORR LYMPH EXTRAV INC GUIDE ROADMAPPING; IR ULTRASOUND GUIDANCE VASC ACCESS RIGHT; ARTERIOGRAPHY; PELVIC SELECTIVE ARTERIOGRAPHY; LEFT EXTREMITY ARTERIOGRAPHY; ADDITIONAL ARTERIOGRAPHY MEDICATIONS: None ANESTHESIA/SEDATION: Patient was intubated. Anesthesia team managed the endotracheal airway CONTRAST:  50 cc Isovue FLUOROSCOPY TIME:  Fluoroscopy Time: 5 minutes 42 seconds COMPLICATIONS: None PROCEDURE: Informed consent was obtained from the patient  following explanation of the procedure, risks, benefits and alternatives. The patient understands, agrees and consents for the procedure. All questions were addressed. A time out was performed prior to the initiation of  the procedure. Maximal barrier sterile technique utilized including caps, mask, sterile gowns, sterile gloves, large sterile drape, hand hygiene, and Betadine prep. Patient positioned supine position on the fluoroscopy table. The right inguinal region was prepped and draped in the usual sterile fashion. Ultrasound survey of the right inguinal region was performed with images stored and sent to PACs. A micropuncture needle was used access the right common femoral artery under ultrasound. With excellent arterial blood flow returned, and an .018 micro wire was passed through the needle, observed enter the abdominal aorta under fluoroscopy. The needle was removed, and a micropuncture sheath was placed over the wire. The inner dilator and wire were removed, and an 035 Bentson wire was advanced under fluoroscopy into the abdominal aorta. The sheath was removed and a standard 5 [place] vascular sheath was placed. The dilator was removed and the sheath was flushed. Omni Flush catheter was used to navigate a Bentson wire over the aortic bifurcation into the left iliac system. Catheter was exchanged for a C2 catheter advanced into the hypogastric artery with an angiogram performed. Catheter was withdrawn and redirected into the external iliac artery. Angiogram was performed. A Rosen wire was advanced into the proximal femoral system and the catheter was withdrawn. Six French right tip sheath was advanced over the bifurcation into the left iliac system. 7 mm x 4 cm balloon was advanced over the Rosen wire into the proximal left external iliac artery. Balloon was gently inflated to low atmospheric pressure, with inflation of approximately 4 minutes. At this time, measurements of the left external iliac artery were performed. Balloon was deflated, the balloon catheter was advanced into the proximal left femoral system, and the 035 Rosen wire was exchanged for an 018 S-V8 wire. Balloon catheter was removed. A 47mm x 32mm Viabahn  stent graft was select thinned and advanced over the 018 wire to the targeted the avulsed artery. Stent graft was deployed. Balloon angioplasty to the nominal diameter of 6 mm was performed. Final angiogram was performed. The 6 French 35 cm right tip sheath was exchanged for a standard 10 cm 6 French sheath. Sheath was elected to remain given the patient's coagulopathy. Patient tolerated the procedure well with no significant blood loss. IMPRESSION: Status post left lower extremity angiogram with emergent covered stent embolization of distal external iliac artery branch vessel pseudoaneurysm/avulsion, with placement of stent graft. Signed, Dulcy Fanny. Earleen Newport, DO [facility] Radiology Electronically Signed   By: Corrie Mckusick D.O.   On: 12/26/2015 22:45   Dg Chest Port 1 View  12/26/2015  CLINICAL DATA:  Patient status post ET tube placement. EXAM: PORTABLE CHEST 1 VIEW COMPARISON:  Chest radiograph 12/25/2015. FINDINGS: ET tube terminates in the mid trachea. Left subclavian central venous catheter tip projects over the superior vena cava. Enteric tube tip and side port terminates in the midesophagus, recommend advancement. Stable cardiac and mediastinal contours. Low lung volumes. Elevation of the right hemidiaphragm. Right-greater-than-left interstitial pulmonary opacities. No large pleural effusion or pneumothorax. IMPRESSION: ET tube tip and side port terminate in the midesophagus, recommend advancement. Bilateral, left-greater-than-right, interstitial opacities which may represent pulmonary edema. These results will be called to the ordering clinician or representative by the Radiologist Assistant, and communication documented in the PACS or zVision Dashboard. Electronically Signed  By: Lovey Newcomer M.D.   On: 12/26/2015 21:07   Dg Hip Operative Unilat W Or W/o Pelvis Left  12/26/2015  CLINICAL DATA:  Left anterior hip replacement due to fracture. EXAM:  OPERATIVE LEFT HIP (WITH PELVIS IF PERFORMED) 1 VIEW TECHNIQUE: Fluoroscopic spot image(s) were submitted for interpretation post-operatively. COMPARISON:  12/25/2015 FINDINGS: A total left hip arthroplasty has been performed. Alignment of the prosthesis is grossly normal on this single view. No evidence for a periprosthetic fracture. IMPRESSION: Left hip replacement without complicating features. Electronically Signed   By: Markus Daft M.D.   On: 12/26/2015 16:34   Dg Hip Unilat With Pelvis 2-3 Views Left  12/25/2015  CLINICAL DATA:  Recent fall with hip pain, initial encounter EXAM: DG HIP (WITH OR WITHOUT PELVIS) 2-3V LEFT COMPARISON:  None. FINDINGS: There is left femoral neck fracture with impaction and angulation at the fracture site. Pelvic ring is intact. No other focal abnormality is seen. IMPRESSION: Left femoral neck fracture. Electronically Signed   By: Inez Catalina M.D.   On: 12/25/2015 21:16   Dg Femur 1v Left  12/25/2015  CLINICAL DATA:  [place] with left hip pain. EXAM: LEFT FEMUR 1 VIEW COMPARISON:  Left hip 12/25/2015 FINDINGS: Single view of the left femur was obtained. There is a fracture involving the proximal left femur at the junction of the femoral head and neck. There is superior displacement of the left femoral neck. Findings are suggestive for a subcapital hip fracture. The mid and distal femur appear to be intact on this single view. IMPRESSION: Fracture of the proximal left femur. Findings are compatible with a subcapital hip fracture. Electronically Signed   By: Markus Daft M.D.   On: 12/25/2015 21:17   [place] Guide Roadmapping  12/26/2015  INDICATION: 75 year old female with a history of a hip arthroplasty and hypotension. EXAM: IR EMBO ART VEN HEMORR LYMPH EXTRAV INC GUIDE ROADMAPPING; IR ULTRASOUND GUIDANCE VASC ACCESS RIGHT; ARTERIOGRAPHY; PELVIC SELECTIVE ARTERIOGRAPHY; LEFT EXTREMITY ARTERIOGRAPHY; ADDITIONAL ARTERIOGRAPHY MEDICATIONS: None  ANESTHESIA/SEDATION: Patient was intubated. Anesthesia team managed the endotracheal airway CONTRAST:  50 cc Isovue FLUOROSCOPY TIME:  Fluoroscopy Time: 5 minutes 42 seconds COMPLICATIONS: None PROCEDURE: Informed consent was obtained from the patient following explanation of the procedure, risks, benefits and alternatives. The patient understands, agrees and consents for the procedure. All questions were addressed. A time out was performed prior to the initiation of the procedure. Maximal barrier sterile technique utilized including caps, mask, sterile gowns, sterile gloves, large sterile drape, hand hygiene, and Betadine prep. Patient positioned supine position on the fluoroscopy table. The right inguinal region was prepped and draped in the usual sterile fashion. Ultrasound survey of the right inguinal region was performed with images stored and sent to PACs. A micropuncture needle was used access the right common femoral artery under ultrasound. With excellent arterial blood flow returned, and an .018 micro wire was passed through the needle, observed enter the abdominal aorta under fluoroscopy. The needle was removed, and a micropuncture sheath was placed over the wire. The inner dilator and wire were removed, and an 035 Bentson wire was advanced under fluoroscopy into the abdominal aorta. The sheath was removed and a standard 5 [place] vascular sheath was placed. The dilator was removed and the sheath was flushed. Omni Flush catheter was used to navigate a Bentson wire over the aortic bifurcation into the left iliac system. Catheter was exchanged for a C2 catheter advanced into the hypogastric  artery with an angiogram performed. Catheter was withdrawn and redirected into the external iliac artery. Angiogram was performed. A Rosen wire was advanced into the proximal femoral system and the catheter was withdrawn. Six French right tip sheath was advanced over the bifurcation into the left iliac system. 7 mm x 4 cm  balloon was advanced over the Rosen wire into the proximal left external iliac artery. Balloon was gently inflated to low atmospheric pressure, with inflation of approximately 4 minutes. At this time, measurements of the left external iliac artery were performed. Balloon was deflated, the balloon catheter was advanced into the proximal left femoral system, and the 035 Rosen wire was exchanged for an 018 S-V8 wire. Balloon catheter was removed. A 60mm x 58mm Viabahn stent graft was select thinned and advanced over the 018 wire to the targeted the avulsed artery. Stent graft was deployed. Balloon angioplasty to the nominal diameter of 6 mm was performed. Final angiogram was performed. The 6 French 35 cm right tip sheath was exchanged for a standard 10 cm 6 French sheath. Sheath was elected to remain given the patient's coagulopathy. Patient tolerated the procedure well with no significant blood loss. IMPRESSION: Status post left lower extremity angiogram with emergent covered stent embolization of distal external iliac artery branch vessel pseudoaneurysm/avulsion, with placement of stent graft. Signed, Dulcy Fanny. Earleen Newport, DO [facility] Radiology Electronically Signed   By: Corrie Mckusick D.O.   On: 12/26/2015 22:45   Anti-infectives: Anti-infectives    Start     Dose/Rate Route Frequency Ordered Stop   12/26/15 2100  ceFAZolin (ANCEF) IVPB 2g/100 mL premix     2 g 200 mL/hr over 30 Minutes Intravenous Every 6 hours 12/26/15 2023 12/27/15 0418   12/26/15 1300  ceFAZolin (ANCEF) IVPB 2g/100 mL premix     2 g 200 mL/hr over 30 Minutes Intravenous To [facility] 12/26/15 1212 12/26/15 1518      Assessment/Plan: s/p Procedure(s): TOTAL HIP ARTHROPLASTY ANTERIOR APPROACH (Left) Hemodynamically stable. H&H remaining stable. No evidence of compromise to lower extremity. Following with you.   LOS: 2 days   Bradly Sangiovanni 12/27/2015, 8:04 AM

## 2015-12-27 NOTE — Procedures (Signed)
Extubation Procedure Note  Patient Details:   Name: Bonnie Salazar DOB: [birth year] MRN: [id number]   Airway Documentation: Pt following commands, audible cuff leak prior to extubation.  Placed on 4lpm [place] post extubation.  Good strong productive cough. Sat 99%.  Able to state full name.  Will continue to monitor.    Evaluation  O2 sats: stable throughout Complications: No apparent complications Patient did tolerate procedure well. Bilateral Breath Sounds: Clear, Diminished   Yes  Ned Grace 12/27/2015, 12:12 PM

## 2015-12-27 NOTE — Progress Notes (Signed)
VASCULAR LAB PRELIMINARY  PRELIMINARY  PRELIMINARY  PRELIMINARY  Bilateral lower extremity venous duplex completed.    Preliminary report:  There is no obvious evidence of DVT or SVT noted in the bilateral lower extremities.   Samyia Motter, RVT 12/27/2015, 5:58 PM

## 2015-12-27 NOTE — Progress Notes (Signed)
1 Day Post-Op  Subjective: HD stable overnight. No pressors.   Objective: Vital signs in last 24 hours: Temp:  [97.4 F (36.3 C)-99 F (37.2 C)] 98.3 F (36.8 C) (06/17 0724) Pulse Rate:  [64-110] 96 (06/17 0700) Resp:  [14-22] 14 (06/17 0700) BP: (95-141)/(59-113) 95/69 mmHg (06/17 0700) SpO2:  [91 %-100 %] 97 % (06/17 0700) Arterial Line BP: (103-174)/(53-93) 103/54 mmHg (06/17 0700) FiO2 (%):  [50 %-80 %] 50 % (06/17 0339) Weight:  [73.8 kg (162 lb 11.2 oz)] 73.8 kg (162 lb 11.2 oz) (06/16 2100) Last BM Date: 12/24/15  Intake/Output from previous day: 06/16 0701 - 06/17 0700 In: [id number] [I.V.:8219.7; [id number]; IV [id number] Out: 2251 [Urine:1150; Stool:1; Blood:1100] Intake/Output this shift: Total I/O In: 3.3 [I.V.:3.3] Out: -   Intubated. Mild sedation but awake. Alert on vent. FC x 4; appears anxious at times cta ant  Tachy at times Distended but soft, no grimacing/peritonitis Palpable DP b/l  Lab Results:   Recent Labs  12/26/15 1740  12/27/15 12/27/15 0400  WBC 20.3*  --  8.3  --   HGB 10.7*  < > 11.6* 11.1*  HCT 31.6*  < > 34.3* 32.3*  PLT 87*  --  75*  --   < > = values in this interval not displayed. BMET  Recent Labs  12/27/15 12/27/15 0400  NA 138 140  K 3.6 3.6  CL 108 110  CO2 22 22  GLUCOSE 196* 145*  BUN 12 14  CREATININE 0.75 0.78  CALCIUM 8.9 8.4*   PT/INR  Recent Labs  12/27/15 12/27/15 0400  LABPROT 16.8* 17.0*  INR 1.35 1.37   ABG  Recent Labs  12/26/15 1720 12/26/15 1855  PHART 7.344* 7.268*  HCO3 19.9* 17.6*    Studies/Results: Dg Chest 1 View  12/25/2015  CLINICAL DATA:  Recent fall today with left hip pain, initial encounter EXAM: CHEST 1 VIEW COMPARISON:  None. FINDINGS: Cardiac shadow is within normal limits. The lungs are well aerated bilaterally. Mild interstitial changes are seen without focal infiltrate. No acute bony abnormality is seen. IMPRESSION: Mild interstitial changes likely of a chronic  nature. No acute abnormality seen. Electronically Signed   By: Inez Catalina M.D.   On: 12/25/2015 21:17   Dg Lumbar Spine Complete  12/25/2015  CLINICAL DATA:  Fall today with low back pain, initial encounter EXAM: LUMBAR SPINE - COMPLETE 4+ VIEW COMPARISON:  None. FINDINGS: Five lumbar type vertebral bodies are well visualized. A mild scoliosis concave to the right is noted. Reactive endplate changes are noted. No anterolisthesis is seen. No soft tissue abnormality is noted. IMPRESSION: Degenerative change without acute abnormality. Electronically Signed   By: Inez Catalina M.D.   On: 12/25/2015 21:17   Ct Pelvis W Contrast  12/26/2015  CLINICAL DATA:  75 year old with large pelvic hematoma following left hip replacement. Evaluate for pelvic bleeding. EXAM: CT PELVIS WITH CONTRAST TECHNIQUE: Multidetector CT imaging of the pelvis was performed using the standard protocol following the bolus administration of intravenous contrast. CONTRAST:  100 mL Isovue COMPARISON:  None. FINDINGS: Vascular structures: The distal abdominal aorta and common iliac arteries are patent. There is active contrast extravasation in the left hemipelvis originating from the proximal left common femoral artery or distal left external iliac artery. Bleeding is originating near the origin of the left inferior epigastric artery but may be separate from this vessel. The left common femoral artery and the proximal left femoral arteries are patent. The right iliac arteries and right femoral  arteries are patent. The delayed images demonstrate additional contrast extravasation within the large hematoma within the anterior pelvis and lower abdomen. There is fluid tracking into the left abdomen. There is fluid and gas tracking up the left iliacus muscle. Expected gas around the left hip from the recent hip replacement. Fluid in the pelvis. Limited evaluation of pelvic structures due to the large amount of blood. The left hip arthroplasty is  located. 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 IMPRESSION: Active bleeding in the pelvis. The bleeding is originating from the proximal left common femoral artery or the distal left external iliac artery. The bleeding is near the origin of the left inferior epigastric artery. Large amount of blood and hematoma formation throughout the abdomen and pelvis. Expected postsurgical changes from left hip arthroplasty. Electronically Signed   By: Markus Daft M.D.   On: 12/26/2015 18:55   Ir Angiogram Extremity Left  12/26/2015  INDICATION: 75 year old female with a history of a hip arthroplasty and hypotension. EXAM: IR EMBO ART VEN HEMORR LYMPH EXTRAV INC GUIDE ROADMAPPING; IR ULTRASOUND GUIDANCE VASC ACCESS RIGHT; ARTERIOGRAPHY; PELVIC SELECTIVE ARTERIOGRAPHY; LEFT EXTREMITY ARTERIOGRAPHY; ADDITIONAL ARTERIOGRAPHY MEDICATIONS: None ANESTHESIA/SEDATION: Patient was intubated. Anesthesia team managed the endotracheal airway CONTRAST:  50 cc Isovue FLUOROSCOPY TIME:  Fluoroscopy Time: 5 minutes 42 seconds COMPLICATIONS: None PROCEDURE: Informed consent was obtained from the patient following explanation of the procedure, risks, benefits and alternatives. The patient understands, agrees and consents for the procedure. All questions were addressed. A time out was performed prior to the initiation of the procedure. Maximal barrier sterile technique utilized including caps, mask, sterile gowns, sterile gloves, large sterile drape, hand hygiene, and Betadine prep. Patient positioned supine position on the fluoroscopy table. The right inguinal region was prepped and draped in the usual sterile fashion. Ultrasound survey of the right inguinal region was performed with images stored and sent to PACs. A micropuncture needle was used access the right common femoral artery under ultrasound. With excellent arterial blood flow returned, and an .018 micro wire was passed through the needle, observed enter the abdominal aorta  under fluoroscopy. The needle was removed, and a micropuncture sheath was placed over the wire. The inner dilator and wire were removed, and an 035 Bentson wire was advanced under fluoroscopy into the abdominal aorta. The sheath was removed and a standard 5 [place] vascular sheath was placed. The dilator was removed and the sheath was flushed. Omni Flush catheter was used to navigate a Bentson wire over the aortic bifurcation into the left iliac system. Catheter was exchanged for a C2 catheter advanced into the hypogastric artery with an angiogram performed. Catheter was withdrawn and redirected into the external iliac artery. Angiogram was performed. A Rosen wire was advanced into the proximal femoral system and the catheter was withdrawn. Six French right tip sheath was advanced over the bifurcation into the left iliac system. 7 mm x 4 cm balloon was advanced over the Rosen wire into the proximal left external iliac artery. Balloon was gently inflated to low atmospheric pressure, with inflation of approximately 4 minutes. At this time, measurements of the left external iliac artery were performed. Balloon was deflated, the balloon catheter was advanced into the proximal left femoral system, and the 035 Rosen wire was exchanged for an 018 S-V8 wire. Balloon catheter was removed. A 22mm x 28mm Viabahn stent graft was select thinned and advanced over the 018 wire to the targeted the avulsed artery. Stent graft was deployed. Balloon angioplasty to the nominal  diameter of 6 mm was performed. Final angiogram was performed. The 6 French 35 cm right tip sheath was exchanged for a standard 10 cm 6 French sheath. Sheath was elected to remain given the patient's coagulopathy. Patient tolerated the procedure well with no significant blood loss. IMPRESSION: Status post left lower extremity angiogram with emergent covered stent embolization of distal external iliac artery branch vessel pseudoaneurysm/avulsion, with placement of  stent graft. Signed, Dulcy Fanny. Earleen Newport, DO [facility] Radiology Electronically Signed   By: Corrie Mckusick D.O.   On: 12/26/2015 22:45   Ir Angiogram Pelvis Selective Or Supraselective  12/26/2015  INDICATION: 75 year old female with a history of a hip arthroplasty and hypotension. EXAM: IR EMBO ART VEN HEMORR LYMPH EXTRAV INC GUIDE ROADMAPPING; IR ULTRASOUND GUIDANCE VASC ACCESS RIGHT; ARTERIOGRAPHY; PELVIC SELECTIVE ARTERIOGRAPHY; LEFT EXTREMITY ARTERIOGRAPHY; ADDITIONAL ARTERIOGRAPHY MEDICATIONS: None ANESTHESIA/SEDATION: Patient was intubated. Anesthesia team managed the endotracheal airway CONTRAST:  50 cc Isovue FLUOROSCOPY TIME:  Fluoroscopy Time: 5 minutes 42 seconds COMPLICATIONS: None PROCEDURE: Informed consent was obtained from the patient following explanation of the procedure, risks, benefits and alternatives. The patient understands, agrees and consents for the procedure. All questions were addressed. A time out was performed prior to the initiation of the procedure. Maximal barrier sterile technique utilized including caps, mask, sterile gowns, sterile gloves, large sterile drape, hand hygiene, and Betadine prep. Patient positioned supine position on the fluoroscopy table. The right inguinal region was prepped and draped in the usual sterile fashion. Ultrasound survey of the right inguinal region was performed with images stored and sent to PACs. A micropuncture needle was used access the right common femoral artery under ultrasound. With excellent arterial blood flow returned, and an .018 micro wire was passed through the needle, observed enter the abdominal aorta under fluoroscopy. The needle was removed, and a micropuncture sheath was placed over the wire. The inner dilator and wire were removed, and an 035 Bentson wire was advanced under fluoroscopy into the abdominal aorta. The sheath was removed and a standard 5 [place] vascular sheath was  placed. The dilator was removed and the sheath was flushed. Omni Flush catheter was used to navigate a Bentson wire over the aortic bifurcation into the left iliac system. Catheter was exchanged for a C2 catheter advanced into the hypogastric artery with an angiogram performed. Catheter was withdrawn and redirected into the external iliac artery. Angiogram was performed. A Rosen wire was advanced into the proximal femoral system and the catheter was withdrawn. Six French right tip sheath was advanced over the bifurcation into the left iliac system. 7 mm x 4 cm balloon was advanced over the Rosen wire into the proximal left external iliac artery. Balloon was gently inflated to low atmospheric pressure, with inflation of approximately 4 minutes. At this time, measurements of the left external iliac artery were performed. Balloon was deflated, the balloon catheter was advanced into the proximal left femoral system, and the 035 Rosen wire was exchanged for an 018 S-V8 wire. Balloon catheter was removed. A 48mm x 71mm Viabahn stent graft was select thinned and advanced over the 018 wire to the targeted the avulsed artery. Stent graft was deployed. Balloon angioplasty to the nominal diameter of 6 mm was performed. Final angiogram was performed. The 6 French 35 cm right tip sheath was exchanged for a standard 10 cm 6 French sheath. Sheath was elected to remain given the patient's coagulopathy. Patient tolerated the procedure well with no significant blood loss. IMPRESSION: Status  post left lower extremity angiogram with emergent covered stent embolization of distal external iliac artery branch vessel pseudoaneurysm/avulsion, with placement of stent graft. Signed, Dulcy Fanny. Earleen Newport, DO [facility] Radiology Electronically Signed   By: Corrie Mckusick D.O.   On: 12/26/2015 22:45   Ir Angiogram Selective Each Additional Vessel  12/26/2015  INDICATION: 75 year old female with a  history of a hip arthroplasty and hypotension. EXAM: IR EMBO ART VEN HEMORR LYMPH EXTRAV INC GUIDE ROADMAPPING; IR ULTRASOUND GUIDANCE VASC ACCESS RIGHT; ARTERIOGRAPHY; PELVIC SELECTIVE ARTERIOGRAPHY; LEFT EXTREMITY ARTERIOGRAPHY; ADDITIONAL ARTERIOGRAPHY MEDICATIONS: None ANESTHESIA/SEDATION: Patient was intubated. Anesthesia team managed the endotracheal airway CONTRAST:  50 cc Isovue FLUOROSCOPY TIME:  Fluoroscopy Time: 5 minutes 42 seconds COMPLICATIONS: None PROCEDURE: Informed consent was obtained from the patient following explanation of the procedure, risks, benefits and alternatives. The patient understands, agrees and consents for the procedure. All questions were addressed. A time out was performed prior to the initiation of the procedure. Maximal barrier sterile technique utilized including caps, mask, sterile gowns, sterile gloves, large sterile drape, hand hygiene, and Betadine prep. Patient positioned supine position on the fluoroscopy table. The right inguinal region was prepped and draped in the usual sterile fashion. Ultrasound survey of the right inguinal region was performed with images stored and sent to PACs. A micropuncture needle was used access the right common femoral artery under ultrasound. With excellent arterial blood flow returned, and an .018 micro wire was passed through the needle, observed enter the abdominal aorta under fluoroscopy. The needle was removed, and a micropuncture sheath was placed over the wire. The inner dilator and wire were removed, and an 035 Bentson wire was advanced under fluoroscopy into the abdominal aorta. The sheath was removed and a standard 5 [place] vascular sheath was placed. The dilator was removed and the sheath was flushed. Omni Flush catheter was used to navigate a Bentson wire over the aortic bifurcation into the left iliac system. Catheter was exchanged for a C2 catheter advanced into the hypogastric artery with an angiogram performed. Catheter was  withdrawn and redirected into the external iliac artery. Angiogram was performed. A Rosen wire was advanced into the proximal femoral system and the catheter was withdrawn. Six French right tip sheath was advanced over the bifurcation into the left iliac system. 7 mm x 4 cm balloon was advanced over the Rosen wire into the proximal left external iliac artery. Balloon was gently inflated to low atmospheric pressure, with inflation of approximately 4 minutes. At this time, measurements of the left external iliac artery were performed. Balloon was deflated, the balloon catheter was advanced into the proximal left femoral system, and the 035 Rosen wire was exchanged for an 018 S-V8 wire. Balloon catheter was removed. A 72mm x 74mm Viabahn stent graft was select thinned and advanced over the 018 wire to the targeted the avulsed artery. Stent graft was deployed. Balloon angioplasty to the nominal diameter of 6 mm was performed. Final angiogram was performed. The 6 French 35 cm right tip sheath was exchanged for a standard 10 cm 6 French sheath. Sheath was elected to remain given the patient's coagulopathy. Patient tolerated the procedure well with no significant blood loss. IMPRESSION: Status post left lower extremity angiogram with emergent covered stent embolization of distal external iliac artery branch vessel pseudoaneurysm/avulsion, with placement of stent graft. Signed, Dulcy Fanny. Earleen Newport, DO [facility] Radiology Electronically Signed   By: Corrie Mckusick D.O.   On: 12/26/2015 22:45  Ir Angiogram Follow Up Study  12/26/2015  INDICATION: 74 year old female with a history of a hip arthroplasty and hypotension. EXAM: IR EMBO ART VEN HEMORR LYMPH EXTRAV INC GUIDE ROADMAPPING; IR ULTRASOUND GUIDANCE VASC ACCESS RIGHT; ARTERIOGRAPHY; PELVIC SELECTIVE ARTERIOGRAPHY; LEFT EXTREMITY ARTERIOGRAPHY; ADDITIONAL ARTERIOGRAPHY MEDICATIONS: None ANESTHESIA/SEDATION: Patient was  intubated. Anesthesia team managed the endotracheal airway CONTRAST:  50 cc Isovue FLUOROSCOPY TIME:  Fluoroscopy Time: 5 minutes 42 seconds COMPLICATIONS: None PROCEDURE: Informed consent was obtained from the patient following explanation of the procedure, risks, benefits and alternatives. The patient understands, agrees and consents for the procedure. All questions were addressed. A time out was performed prior to the initiation of the procedure. Maximal barrier sterile technique utilized including caps, mask, sterile gowns, sterile gloves, large sterile drape, hand hygiene, and Betadine prep. Patient positioned supine position on the fluoroscopy table. The right inguinal region was prepped and draped in the usual sterile fashion. Ultrasound survey of the right inguinal region was performed with images stored and sent to PACs. A micropuncture needle was used access the right common femoral artery under ultrasound. With excellent arterial blood flow returned, and an .018 micro wire was passed through the needle, observed enter the abdominal aorta under fluoroscopy. The needle was removed, and a micropuncture sheath was placed over the wire. The inner dilator and wire were removed, and an 035 Bentson wire was advanced under fluoroscopy into the abdominal aorta. The sheath was removed and a standard 5 [place] vascular sheath was placed. The dilator was removed and the sheath was flushed. Omni Flush catheter was used to navigate a Bentson wire over the aortic bifurcation into the left iliac system. Catheter was exchanged for a C2 catheter advanced into the hypogastric artery with an angiogram performed. Catheter was withdrawn and redirected into the external iliac artery. Angiogram was performed. A Rosen wire was advanced into the proximal femoral system and the catheter was withdrawn. Six French right tip sheath was advanced over the bifurcation into the left iliac system. 7 mm x 4 cm balloon was advanced over the  Rosen wire into the proximal left external iliac artery. Balloon was gently inflated to low atmospheric pressure, with inflation of approximately 4 minutes. At this time, measurements of the left external iliac artery were performed. Balloon was deflated, the balloon catheter was advanced into the proximal left femoral system, and the 035 Rosen wire was exchanged for an 018 S-V8 wire. Balloon catheter was removed. A 24mm x 9mm Viabahn stent graft was select thinned and advanced over the 018 wire to the targeted the avulsed artery. Stent graft was deployed. Balloon angioplasty to the nominal diameter of 6 mm was performed. Final angiogram was performed. The 6 French 35 cm right tip sheath was exchanged for a standard 10 cm 6 French sheath. Sheath was elected to remain given the patient's coagulopathy. Patient tolerated the procedure well with no significant blood loss. IMPRESSION: Status post left lower extremity angiogram with emergent covered stent embolization of distal external iliac artery branch vessel pseudoaneurysm/avulsion, with placement of stent graft. Signed, Dulcy Fanny. Earleen Newport, DO [facility] Radiology Electronically Signed   By: Corrie Mckusick D.O.   On: 12/26/2015 22:45   Ir US Guide Vasc Access Right  12/26/2015  INDICATION: 75 year old female with a history of a hip arthroplasty and hypotension. EXAM: IR EMBO ART VEN HEMORR LYMPH EXTRAV INC GUIDE ROADMAPPING; IR ULTRASOUND GUIDANCE VASC ACCESS RIGHT; ARTERIOGRAPHY; PELVIC SELECTIVE ARTERIOGRAPHY; LEFT EXTREMITY ARTERIOGRAPHY; ADDITIONAL ARTERIOGRAPHY MEDICATIONS: None ANESTHESIA/SEDATION:  Patient was intubated. Anesthesia team managed the endotracheal airway CONTRAST:  50 cc Isovue FLUOROSCOPY TIME:  Fluoroscopy Time: 5 minutes 42 seconds COMPLICATIONS: None PROCEDURE: Informed consent was obtained from the patient following explanation of the procedure, risks, benefits and alternatives. The patient  understands, agrees and consents for the procedure. All questions were addressed. A time out was performed prior to the initiation of the procedure. Maximal barrier sterile technique utilized including caps, mask, sterile gowns, sterile gloves, large sterile drape, hand hygiene, and Betadine prep. Patient positioned supine position on the fluoroscopy table. The right inguinal region was prepped and draped in the usual sterile fashion. Ultrasound survey of the right inguinal region was performed with images stored and sent to PACs. A micropuncture needle was used access the right common femoral artery under ultrasound. With excellent arterial blood flow returned, and an .018 micro wire was passed through the needle, observed enter the abdominal aorta under fluoroscopy. The needle was removed, and a micropuncture sheath was placed over the wire. The inner dilator and wire were removed, and an 035 Bentson wire was advanced under fluoroscopy into the abdominal aorta. The sheath was removed and a standard 5 [place] vascular sheath was placed. The dilator was removed and the sheath was flushed. Omni Flush catheter was used to navigate a Bentson wire over the aortic bifurcation into the left iliac system. Catheter was exchanged for a C2 catheter advanced into the hypogastric artery with an angiogram performed. Catheter was withdrawn and redirected into the external iliac artery. Angiogram was performed. A Rosen wire was advanced into the proximal femoral system and the catheter was withdrawn. Six French right tip sheath was advanced over the bifurcation into the left iliac system. 7 mm x 4 cm balloon was advanced over the Rosen wire into the proximal left external iliac artery. Balloon was gently inflated to low atmospheric pressure, with inflation of approximately 4 minutes. At this time, measurements of the left external iliac artery were performed. Balloon was deflated, the balloon catheter was advanced into the proximal  left femoral system, and the 035 Rosen wire was exchanged for an 018 S-V8 wire. Balloon catheter was removed. A 34mm x 75mm Viabahn stent graft was select thinned and advanced over the 018 wire to the targeted the avulsed artery. Stent graft was deployed. Balloon angioplasty to the nominal diameter of 6 mm was performed. Final angiogram was performed. The 6 French 35 cm right tip sheath was exchanged for a standard 10 cm 6 French sheath. Sheath was elected to remain given the patient's coagulopathy. Patient tolerated the procedure well with no significant blood loss. IMPRESSION: Status post left lower extremity angiogram with emergent covered stent embolization of distal external iliac artery branch vessel pseudoaneurysm/avulsion, with placement of stent graft. Signed, Dulcy Fanny. Earleen Newport, DO [facility] Radiology Electronically Signed   By: Corrie Mckusick D.O.   On: 12/26/2015 22:45   Dg Chest Port 1 View  12/26/2015  CLINICAL DATA:  Patient status post ET tube placement. EXAM: PORTABLE CHEST 1 VIEW COMPARISON:  Chest radiograph 12/25/2015. FINDINGS: ET tube terminates in the mid trachea. Left subclavian central venous catheter tip projects over the superior vena cava. Enteric tube tip and side port terminates in the midesophagus, recommend advancement. Stable cardiac and mediastinal contours. Low lung volumes. Elevation of the right hemidiaphragm. Right-greater-than-left interstitial pulmonary opacities. No large pleural effusion or pneumothorax. IMPRESSION: ET tube tip and side port terminate in the midesophagus, recommend advancement. Bilateral, left-greater-than-right, interstitial opacities  which may represent pulmonary edema. These results will be called to the ordering clinician or representative by the Radiologist Assistant, and communication documented in the PACS or zVision Dashboard. Electronically Signed   By: Lovey Newcomer M.D.   On: 12/26/2015 21:07   Dg  Hip Operative Unilat W Or W/o Pelvis Left  12/26/2015  CLINICAL DATA:  Left anterior hip replacement due to fracture. EXAM: OPERATIVE LEFT HIP (WITH PELVIS IF PERFORMED) 1 VIEW TECHNIQUE: Fluoroscopic spot image(s) were submitted for interpretation post-operatively. COMPARISON:  12/25/2015 FINDINGS: A total left hip arthroplasty has been performed. Alignment of the prosthesis is grossly normal on this single view. No evidence for a periprosthetic fracture. IMPRESSION: Left hip replacement without complicating features. Electronically Signed   By: Markus Daft M.D.   On: 12/26/2015 16:34   Dg Hip Unilat With Pelvis 2-3 Views Left  12/25/2015  CLINICAL DATA:  Recent fall with hip pain, initial encounter EXAM: DG HIP (WITH OR WITHOUT PELVIS) 2-3V LEFT COMPARISON:  None. FINDINGS: There is left femoral neck fracture with impaction and angulation at the fracture site. Pelvic ring is intact. No other focal abnormality is seen. IMPRESSION: Left femoral neck fracture. Electronically Signed   By: Inez Catalina M.D.   On: 12/25/2015 21:16   Dg Femur 1v Left  12/25/2015  CLINICAL DATA:  [place] with left hip pain. EXAM: LEFT FEMUR 1 VIEW COMPARISON:  Left hip 12/25/2015 FINDINGS: Single view of the left femur was obtained. There is a fracture involving the proximal left femur at the junction of the femoral head and neck. There is superior displacement of the left femoral neck. Findings are suggestive for a subcapital hip fracture. The mid and distal femur appear to be intact on this single view. IMPRESSION: Fracture of the proximal left femur. Findings are compatible with a subcapital hip fracture. Electronically Signed   By: Markus Daft M.D.   On: 12/25/2015 21:17   [place] Guide Roadmapping  12/26/2015  INDICATION: 75 year old female with a history of a hip arthroplasty and hypotension. EXAM: IR EMBO ART VEN HEMORR LYMPH EXTRAV INC GUIDE ROADMAPPING; IR ULTRASOUND GUIDANCE VASC ACCESS  RIGHT; ARTERIOGRAPHY; PELVIC SELECTIVE ARTERIOGRAPHY; LEFT EXTREMITY ARTERIOGRAPHY; ADDITIONAL ARTERIOGRAPHY MEDICATIONS: None ANESTHESIA/SEDATION: Patient was intubated. Anesthesia team managed the endotracheal airway CONTRAST:  50 cc Isovue FLUOROSCOPY TIME:  Fluoroscopy Time: 5 minutes 42 seconds COMPLICATIONS: None PROCEDURE: Informed consent was obtained from the patient following explanation of the procedure, risks, benefits and alternatives. The patient understands, agrees and consents for the procedure. All questions were addressed. A time out was performed prior to the initiation of the procedure. Maximal barrier sterile technique utilized including caps, mask, sterile gowns, sterile gloves, large sterile drape, hand hygiene, and Betadine prep. Patient positioned supine position on the fluoroscopy table. The right inguinal region was prepped and draped in the usual sterile fashion. Ultrasound survey of the right inguinal region was performed with images stored and sent to PACs. A micropuncture needle was used access the right common femoral artery under ultrasound. With excellent arterial blood flow returned, and an .018 micro wire was passed through the needle, observed enter the abdominal aorta under fluoroscopy. The needle was removed, and a micropuncture sheath was placed over the wire. The inner dilator and wire were removed, and an 035 Bentson wire was advanced under fluoroscopy into the abdominal aorta. The sheath was removed and a standard 5 [place] vascular sheath was placed. The dilator was removed and the  sheath was flushed. Omni Flush catheter was used to navigate a Bentson wire over the aortic bifurcation into the left iliac system. Catheter was exchanged for a C2 catheter advanced into the hypogastric artery with an angiogram performed. Catheter was withdrawn and redirected into the external iliac artery. Angiogram was performed. A Rosen wire was advanced into the proximal femoral system and  the catheter was withdrawn. Six French right tip sheath was advanced over the bifurcation into the left iliac system. 7 mm x 4 cm balloon was advanced over the Rosen wire into the proximal left external iliac artery. Balloon was gently inflated to low atmospheric pressure, with inflation of approximately 4 minutes. At this time, measurements of the left external iliac artery were performed. Balloon was deflated, the balloon catheter was advanced into the proximal left femoral system, and the 035 Rosen wire was exchanged for an 018 S-V8 wire. Balloon catheter was removed. A 98mm x 35mm Viabahn stent graft was select thinned and advanced over the 018 wire to the targeted the avulsed artery. Stent graft was deployed. Balloon angioplasty to the nominal diameter of 6 mm was performed. Final angiogram was performed. The 6 French 35 cm right tip sheath was exchanged for a standard 10 cm 6 French sheath. Sheath was elected to remain given the patient's coagulopathy. Patient tolerated the procedure well with no significant blood loss. IMPRESSION: Status post left lower extremity angiogram with emergent covered stent embolization of distal external iliac artery branch vessel pseudoaneurysm/avulsion, with placement of stent graft. Signed, Dulcy Fanny. Earleen Newport, DO [facility] Electronically Signed   By: Corrie Mckusick D.O.   On: 12/26/2015 22:45    Anti-infectives: Anti-infectives    Start     Dose/Rate Route Frequency Ordered Stop   12/26/15 2100  ceFAZolin (ANCEF) IVPB 2g/100 mL premix     2 g 200 mL/hr over 30 Minutes Intravenous Every 6 hours 12/26/15 2023 12/27/15 0418   12/26/15 1300  ceFAZolin (ANCEF) IVPB 2g/100 mL premix     2 g 200 mL/hr over 30 Minutes Intravenous To [facility] 12/26/15 1212 12/26/15 1518      Assessment/Plan: s/p Procedure(s): TOTAL HIP ARTHROPLASTY ANTERIOR APPROACH (Left) L ext iliac artery bleed/pelvic hematoma Status  post left lower extremity angiogram with emergent covered stent embolization of distal external iliac artery branch vessel pseudoaneurysm/avulsion, with placement of stent graft.  hgb stable. No sign of addl bleeding Making urine Believe tachy at times is due to anxiety No signs of abd compartment syndrome Defer troponin bump to CCM Will follow  Leighton Ruff. Redmond Pulling, MD, FACS General, Bariatric, & Minimally Invasive Surgery [facility], [place]   LOS: 2 days    Gayland Curry 12/27/2015

## 2015-12-27 NOTE — Progress Notes (Signed)
She is resting comfortably. Minimal pain  LLE: NVI, dressing C/D/I, 2+DP pulse  I appreciate the help of all consulting services  Hip is stable From an orthopedic standpoint her normal plan would be as follows:  WBAT LLE  PT for mobilization  ASA 325 daily for 30 days for dvt px.   I will discuss with all teams involved about adjusting this plan as she recovers.   Bonnie Salazar D Cell: [phone]

## 2015-12-27 NOTE — Progress Notes (Signed)
eLink Physician-Brief Progress Note Patient Name: MODESTY BELGRAVE DOB: [birth year] MRN: [id number]   Date of Service  12/27/2015  HPI/Events of Note  Continued increase in trop now at 1.2.  Concern for bleeding on no heparin.  eICU Interventions  Plan: Hold heparin 2D ECHO ordered Consider cardiology consult     Intervention Category Intermediate Interventions: Other:  Avia Merkley 12/27/2015, 4:57 AM

## 2015-12-27 NOTE — Consult Note (Signed)
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 Kathi Simpers is an 75 y.o. female.  HPI:   The patient is a 75 year old woman who was in previous good health until she fell and fractured her left hip. She underwent anterior total hip arthroplasty yesterday by Dr. Percell Miller and postop began having lower abdominal tenderness and swelling with hypotension and tachycardia. CT scan showed extravasation of contrast in the left lower quadrant consistent with vascular injury and she underwent repair by VIR with a covered stent to the distal external iliac artery. She required transfusion of 11 units PRBC's, 7 units FFP, 2 units of platelets and 2 units of cryoprecipitate. She was extubated today. She went into atrial fib today and cardiology was consulted. She was noted to have a heart murmur and an echo showed a mobile mass in the right atrium measuring 3.7 x 2.4 cm. The RV was mildly dilated. There was no TR. There was severe MR with prolapse of the anterior leaflet.  Lower extremity venous dopplers were negative for DVT. She had a CTA of the chest this evening which shows bilateral pulmonary emboli with nearly completely occlusive thrombus in the left lower lobar PA with clot extending into the basilar segmental arteries. There is nonocclusive clot in the left upper, lingular, right upper, right middle and right lower lobe pulmonary arteries. There is evidence of right heart strain with RV/LV ratio over 1. There is an irregular hypoattenuated area in the RA consistent with the mass seen on echo. There is moderate bilateral pleural effusions and bilateral lower lobe atelectasis.  She has been hemodynamically stable today with sats 96% on 6 L [place].  Past Medical History  Diagnosis Date  . Medical  history non-contributory     Past Surgical History  Procedure Laterality Date  . Back surgery      Family History  Problem Relation Age of Onset  . Hodgkin's lymphoma Mother   . Stroke Father   . Coronary artery disease Father     in his late 62s    Social History:  reports that she has quit smoking. She quit smokeless tobacco use about 20 years ago. She reports that she does not drink alcohol or use illicit drugs.  Allergies: No Known Allergies  Medications:  I have reviewed the patient's current medications. Prior to Admission:  Prescriptions prior to admission  Medication Sig Dispense Refill Last Dose  . CALCIUM PO Take 2 tablets by mouth daily.   12/25/2015 at Unknown time  . chlorhexidine (PERIDEX) 0.12 % solution 15 mLs by Mouth Rinse route 2 (two) times daily.   12/25/2015 at Unknown time  . Cholecalciferol (VITAMIN D PO) Take 1 capsule by mouth daily.   12/25/2015 at Unknown time  . fluticasone (FLONASE) 50 MCG/ACT nasal spray Place 2 sprays into the nose daily as needed for allergies.    Past Month at Unknown time  . GINKGO BILOBA PO Take 1 capsule by mouth daily.   12/25/2015 at Unknown time  . Misc Natural Products (GLUCOSAMINE CHONDROITIN TRIPLE) TABS Take 2 tablets by mouth daily.   12/25/2015 at Unknown time  . Multiple Vitamin (MULTIVITAMIN WITH MINERALS) TABS tablet Take 1 tablet  by mouth daily.   12/25/2015 at Unknown time  . Multiple Vitamins-Minerals (ICAPS AREDS 2) CAPS Take 1 capsule by mouth 2 (two) times daily.   12/25/2015 at Unknown time  . naproxen sodium (ANAPROX) 220 MG tablet Take 220 mg by mouth at bedtime as needed (pain).   12/24/2015 at Unknown time  . Omega-3 Fatty Acids (OMEGA 3 PO) Take 1 capsule by mouth daily.   12/25/2015 at Unknown time   Scheduled: . aspirin EC  325 mg Oral Daily  . chlorhexidine gluconate (SAGE KIT)  15 mL Mouth Rinse BID  . docusate sodium  100 mg Oral BID  . famotidine (PEPCID) IV  20 mg Intravenous Q12H  . feeding  supplement (ENSURE ENLIVE)  237 mL Oral BID BM  . metoprolol      . metoprolol  2.5 mg Intravenous Q3H   Continuous: . sodium chloride 75 mL/hr at 12/26/15 2219  . heparin 1,100 Units/hr (12/27/15 1746)   ATF:TDDUKGURKYHCW, fentaNYL (SUBLIMAZE) injection, [DISCONTINUED] metoCLOPramide **OR** metoCLOPramide (REGLAN) injection, ondansetron **OR** ondansetron (ZOFRAN) IV Anti-infectives    Start     Dose/Rate Route Frequency Ordered Stop   12/26/15 2100  ceFAZolin (ANCEF) IVPB 2g/100 mL premix     2 g 200 mL/hr over 30 Minutes Intravenous Every 6 hours 12/26/15 2023 12/27/15 0418   12/26/15 1300  ceFAZolin (ANCEF) IVPB 2g/100 mL premix     2 g 200 mL/hr over 30 Minutes Intravenous To [facility] 12/26/15 1212 12/26/15 1518      Results for orders placed or performed during the hospital encounter of 12/25/15 (from the past 48 hour(s))  Basic metabolic panel     Status: Abnormal   Collection Time: 12/25/15  8:13 PM  Result Value Ref Range   Sodium 132 (L) 135 - 145 mmol/L   Potassium 4.3 3.5 - 5.1 mmol/L   Chloride 101 101 - 111 mmol/L   CO2 23 22 - 32 mmol/L   Glucose, Bld 143 (H) 65 - 99 mg/dL   BUN 21 (H) 6 - 20 mg/dL   Creatinine, Ser 0.76 0.44 - 1.00 mg/dL   Calcium 9.2 8.9 - 10.3 mg/dL   GFR calc non Af Amer >60 >60 mL/min   GFR calc Af Amer >60 >60 mL/min    Comment: (NOTE) The eGFR has been calculated using the CKD EPI equation. This calculation has not been validated in all clinical situations. eGFR's persistently <60 mL/min signify possible Chronic Kidney Disease.    Anion gap 8 5 - 15  CBC WITH DIFFERENTIAL     Status: Abnormal   Collection Time: 12/25/15  8:13 PM  Result Value Ref Range   WBC 12.6 (H) 4.0 - 10.5 K/uL   RBC 4.15 3.87 - 5.11 MIL/uL   Hemoglobin 12.0 12.0 - 15.0 g/dL   HCT 35.3 (L) 36.0 - 46.0 %   MCV 85.1 78.0 - 100.0 fL   MCH 28.9 26.0 - 34.0 pg   MCHC 34.0 30.0 - 36.0 g/dL   RDW 13.1 11.5 - 15.5 %   Platelets 246 150 - 400 K/uL    Neutrophils Relative % 85 %   Neutro Abs 10.8 (H) 1.7 - 7.7 K/uL   Lymphocytes Relative 9 %   Lymphs Abs 1.1 0.7 - 4.0 K/uL   Monocytes Relative 6 %   Monocytes Absolute 0.7 0.1 - 1.0 K/uL   Eosinophils Relative 0 %   Eosinophils Absolute 0.0 0.0 - 0.7 K/uL   Basophils Relative 0 %   Basophils  Absolute 0.0 0.0 - 0.1 K/uL  Protime-INR     Status: None   Collection Time: 12/25/15  8:13 PM  Result Value Ref Range   Prothrombin Time 14.3 11.6 - 15.2 seconds   INR 1.13 0.00 - 1.49  Type and screen [place]     Status: None   Collection Time: 12/25/15  8:13 PM  Result Value Ref Range   ABO/RH(D) B POS    Antibody Screen NEG    Sample Expiration 12/28/2015   ABO/Rh     Status: None   Collection Time: 12/25/15  8:15 PM  Result Value Ref Range   ABO/RH(D) B POS   Urinalysis, Routine w reflex microscopic (not at [facility])     Status: Abnormal   Collection Time: 12/25/15  8:28 PM  Result Value Ref Range   Color, Urine YELLOW YELLOW   APPearance CLOUDY (A) CLEAR   Specific Gravity, Urine 1.016 1.005 - 1.030   pH 6.5 5.0 - 8.0   Glucose, UA NEGATIVE NEGATIVE mg/dL   Hgb urine dipstick SMALL (A) NEGATIVE   Bilirubin Urine NEGATIVE NEGATIVE   Ketones, ur >80 (A) NEGATIVE mg/dL   Protein, ur NEGATIVE NEGATIVE mg/dL   Nitrite NEGATIVE NEGATIVE   Leukocytes, UA SMALL (A) NEGATIVE  Urine microscopic-add on     Status: Abnormal   Collection Time: 12/25/15  8:28 PM  Result Value Ref Range   Squamous Epithelial / LPF 6-30 (A) NONE SEEN   WBC, UA 0-5 0 - 5 WBC/hpf   RBC / HPF 0-5 0 - 5 RBC/hpf   Bacteria, UA MANY (A) NONE SEEN  CBC     Status: Abnormal   Collection Time: 12/26/15  4:44 AM  Result Value Ref Range   WBC 9.7 4.0 - 10.5 K/uL   RBC 3.89 3.87 - 5.11 MIL/uL   Hemoglobin 10.7 (L) 12.0 - 15.0 g/dL   HCT 32.8 (L) 36.0 - 46.0 %   MCV 84.3 78.0 - 100.0 fL   MCH 27.5 26.0 - 34.0 pg   MCHC 32.6 30.0 - 36.0 g/dL   RDW 13.0 11.5 - 15.5 %   Platelets 218 150 - 400  K/uL  Basic metabolic panel     Status: Abnormal   Collection Time: 12/26/15  4:44 AM  Result Value Ref Range   Sodium 134 (L) 135 - 145 mmol/L   Potassium 4.1 3.5 - 5.1 mmol/L   Chloride 103 101 - 111 mmol/L   CO2 25 22 - 32 mmol/L   Glucose, Bld 128 (H) 65 - 99 mg/dL   BUN 15 6 - 20 mg/dL   Creatinine, Ser 0.77 0.44 - 1.00 mg/dL   Calcium 8.9 8.9 - 10.3 mg/dL   GFR calc non Af Amer >60 >60 mL/min   GFR calc Af Amer >60 >60 mL/min    Comment: (NOTE) The eGFR has been calculated using the CKD EPI equation. This calculation has not been validated in all clinical situations. eGFR's persistently <60 mL/min signify possible Chronic Kidney Disease.    Anion gap 6 5 - 15  Type and screen [place]     Status: None (Preliminary result)   Collection Time: 12/26/15  4:44 AM  Result Value Ref Range   ABO/RH(D) B POS    Antibody Screen NEG    Sample Expiration 12/29/2015    Unit Number [id number]    Blood Component Type RED CELLS,LR    Unit division 00    Status of Unit  ISSUED,FINAL    Transfusion Status OK TO TRANSFUSE    Crossmatch Result Compatible    Unit Number [id number]    Blood Component Type RED CELLS,LR    Unit division 00    Status of Unit ISSUED,FINAL    Transfusion Status OK TO TRANSFUSE    Crossmatch Result Compatible    Unit Number [id number]    Blood Component Type RED CELLS,LR    Unit division 00    Status of Unit ISSUED,FINAL    Transfusion Status OK TO TRANSFUSE    Crossmatch Result Compatible    Unit Number [id number]    Blood Component Type RED CELLS,LR    Unit division 00    Status of Unit ISSUED,FINAL    Transfusion Status OK TO TRANSFUSE    Crossmatch Result Compatible    Unit Number [id number]    Blood Component Type RED CELLS,LR    Unit division 00    Status of Unit ISSUED,FINAL    Transfusion Status OK TO TRANSFUSE    Crossmatch Result Compatible    Unit Number [id number]    Blood Component Type RED  CELLS,LR    Unit division 00    Status of Unit ISSUED,FINAL    Transfusion Status OK TO TRANSFUSE    Crossmatch Result Compatible    Unit Number [id number]    Blood Component Type RED CELLS,LR    Unit division 00    Status of Unit ISSUED,FINAL    Transfusion Status OK TO TRANSFUSE    Crossmatch Result Compatible    Unit Number [id number]    Blood Component Type RED CELLS,LR    Unit division 00    Status of Unit ISSUED,FINAL    Transfusion Status OK TO TRANSFUSE    Crossmatch Result Compatible    Unit Number [id number]    Blood Component Type RED CELLS,LR    Unit division 00    Status of Unit ISSUED,FINAL    Transfusion Status OK TO TRANSFUSE    Crossmatch Result Compatible    Unit Number [id number]    Blood Component Type RED CELLS,LR    Unit division 00    Status of Unit ISSUED,FINAL    Transfusion Status OK TO TRANSFUSE    Crossmatch Result Compatible    Unit Number [id number]    Blood Component Type RED CELLS,LR    Unit division 00    Status of Unit ISSUED,FINAL    Transfusion Status OK TO TRANSFUSE    Crossmatch Result Compatible    Unit Number [id number]    Blood Component Type RED CELLS,LR    Unit division 00    Status of Unit ISSUED,FINAL    Transfusion Status OK TO TRANSFUSE    Crossmatch Result Compatible    Unit Number [id number]    Blood Component Type RED CELLS,LR    Unit division 00    Status of Unit ISSUED,FINAL    Transfusion Status OK TO TRANSFUSE    Crossmatch Result Compatible    Unit Number [id number]    Blood Component Type RED CELLS,LR    Unit division 00    Status of Unit ISSUED,FINAL    Transfusion Status OK TO TRANSFUSE    Crossmatch Result Compatible    Unit Number [id number]    Blood Component Type RED CELLS,LR    Unit division 00    Status of Unit REL FROM [facility]    Transfusion Status OK TO TRANSFUSE    Crossmatch Result Compatible    Unit Number [id number]  Blood Component Type RED CELLS,LR    Unit  division 00    Status of Unit REL FROM [facility]    Transfusion Status OK TO TRANSFUSE    Crossmatch Result Compatible    Unit Number [id number]    Blood Component Type RED CELLS,LR    Unit division 00    Status of Unit REL FROM [facility]    Transfusion Status OK TO TRANSFUSE    Crossmatch Result Compatible    Unit Number [id number]    Blood Component Type RED CELLS,LR    Unit division 00    Status of Unit REL FROM [facility]    Transfusion Status OK TO TRANSFUSE    Crossmatch Result Compatible    Unit Number [id number]    Blood Component Type RED CELLS,LR    Unit division 00    Status of Unit ALLOCATED    Transfusion Status OK TO TRANSFUSE    Crossmatch Result Compatible    Unit Number [id number]    Blood Component Type RED CELLS,LR    Unit division 00    Status of Unit ALLOCATED    Transfusion Status OK TO TRANSFUSE    Crossmatch Result Compatible    Unit Number [id number]    Blood Component Type RBC LR PHER1    Unit division 00    Status of Unit ALLOCATED    Transfusion Status OK TO TRANSFUSE    Crossmatch Result Compatible    Unit Number [id number]    Blood Component Type RED CELLS,LR    Unit division 00    Status of Unit ALLOCATED    Transfusion Status OK TO TRANSFUSE    Crossmatch Result Compatible   ABO/Rh     Status: None   Collection Time: 12/26/15  4:44 AM  Result Value Ref Range   ABO/RH(D) B POS   Surgical pcr screen     Status: None   Collection Time: 12/26/15  8:27 AM  Result Value Ref Range   MRSA, PCR NEGATIVE NEGATIVE   Staphylococcus aureus NEGATIVE NEGATIVE    Comment:        The Xpert SA Assay (FDA approved for NASAL specimens in patients over 75 years of age), is one component of a comprehensive surveillance program.  Test performance has been validated by [facility] for patients greater than or equal to 33 year old. It is not intended to diagnose infection nor to guide or monitor treatment.   Prepare RBC     Status: None    Collection Time: 12/26/15  3:55 PM  Result Value Ref Range   Order Confirmation ORDER PROCESSED BY BLOOD BANK   POCT I-Stat EG7     Status: Abnormal   Collection Time: 12/26/15  4:01 PM  Result Value Ref Range   pH, Ven 7.332 (H) 7.250 - 7.300   pCO2, Ven 39.4 (L) 45.0 - 50.0 mmHg   pO2, Ven 21.0 (L) 31.0 - 45.0 mmHg   Bicarbonate 20.9 20.0 - 24.0 mEq/L   TCO2 22 0 - 100 mmol/L   O2 Saturation 31.0 %   Acid-base deficit 5.0 (H) 0.0 - 2.0 mmol/L   Sodium 137 135 - 145 mmol/L   Potassium 3.8 3.5 - 5.1 mmol/L   Calcium, Ion 1.18 1.13 - 1.30 mmol/L   HCT 25.0 (L) 36.0 - 46.0 %   Hemoglobin 8.5 (L) 12.0 - 15.0 g/dL   Patient temperature 36.8 C    Sample type VENOUS    Comment VALUES EXPECTED, NO REPEAT   I-STAT  7, (LYTES, BLD GAS, ICA, H+H)     Status: Abnormal   Collection Time: 12/26/15  4:09 PM  Result Value Ref Range   pH, Arterial 7.287 (L) 7.350 - 7.450   pCO2 arterial 36.9 35.0 - 45.0 mmHg   pO2, Arterial 441.0 (H) 80.0 - 100.0 mmHg   Bicarbonate 17.6 (L) 20.0 - 24.0 mEq/L   TCO2 19 0 - 100 mmol/L   O2 Saturation 100.0 %   Acid-base deficit 8.0 (H) 0.0 - 2.0 mmol/L   Sodium 135 135 - 145 mmol/L   Potassium 4.1 3.5 - 5.1 mmol/L   Calcium, Ion 1.13 1.13 - 1.30 mmol/L   HCT 17.0 (L) 36.0 - 46.0 %   Hemoglobin 5.8 (LL) 12.0 - 15.0 g/dL   Patient temperature 36.8 C    Sample type ARTERIAL    Comment VALUES EXPECTED, NO REPEAT   POCT Activated clotting time     Status: None   Collection Time: 12/26/15  4:36 PM  Result Value Ref Range   Activated Clotting Time 114 seconds  I-STAT 7, (LYTES, BLD GAS, ICA, H+H)     Status: Abnormal   Collection Time: 12/26/15  4:56 PM  Result Value Ref Range   pH, Arterial 7.228 (L) 7.350 - 7.450   pCO2 arterial 43.7 35.0 - 45.0 mmHg   pO2, Arterial 511.0 (H) 80.0 - 100.0 mmHg   Bicarbonate 18.3 (L) 20.0 - 24.0 mEq/L   TCO2 20 0 - 100 mmol/L   O2 Saturation 100.0 %   Acid-base deficit 9.0 (H) 0.0 - 2.0 mmol/L   Sodium 139 135 - 145  mmol/L   Potassium 4.0 3.5 - 5.1 mmol/L   Calcium, Ion 1.01 (L) 1.13 - 1.30 mmol/L   HCT 28.0 (L) 36.0 - 46.0 %   Hemoglobin 9.5 (L) 12.0 - 15.0 g/dL   Patient temperature 36.7 C    Sample type ARTERIAL   I-STAT 7, (LYTES, BLD GAS, ICA, H+H)     Status: Abnormal   Collection Time: 12/26/15  5:08 PM  Result Value Ref Range   pH, Arterial 7.372 7.350 - 7.450   pCO2 arterial 35.1 35.0 - 45.0 mmHg   pO2, Arterial 112.0 (H) 80.0 - 100.0 mmHg   Bicarbonate 20.6 20.0 - 24.0 mEq/L   TCO2 22 0 - 100 mmol/L   O2 Saturation 98.0 %   Acid-base deficit 4.0 (H) 0.0 - 2.0 mmol/L   Sodium 141 135 - 145 mmol/L   Potassium 3.9 3.5 - 5.1 mmol/L   Calcium, Ion 1.01 (L) 1.13 - 1.30 mmol/L   HCT 24.0 (L) 36.0 - 46.0 %   Hemoglobin 8.2 (L) 12.0 - 15.0 g/dL   Patient temperature 36.0 C    Sample type ARTERIAL   I-STAT 7, (LYTES, BLD GAS, ICA, H+H)     Status: Abnormal   Collection Time: 12/26/15  5:20 PM  Result Value Ref Range   pH, Arterial 7.344 (L) 7.350 - 7.450   pCO2 arterial 36.1 35.0 - 45.0 mmHg   pO2, Arterial 121.0 (H) 80.0 - 100.0 mmHg   Bicarbonate 19.9 (L) 20.0 - 24.0 mEq/L   TCO2 21 0 - 100 mmol/L   O2 Saturation 99.0 %   Acid-base deficit 6.0 (H) 0.0 - 2.0 mmol/L   Sodium 139 135 - 145 mmol/L   Potassium 3.9 3.5 - 5.1 mmol/L   Calcium, Ion 1.05 (L) 1.13 - 1.30 mmol/L   HCT 23.0 (L) 36.0 - 46.0 %   Hemoglobin 7.8 (L) 12.0 -  15.0 g/dL   Patient temperature 35.7 C    Sample type ARTERIAL   Prepare fresh frozen plasma     Status: None   Collection Time: 12/26/15  5:28 PM  Result Value Ref Range   Unit Number [id number]    Blood Component Type THAWED PLASMA    Unit division 00    Status of Unit ISSUED,FINAL    Transfusion Status OK TO TRANSFUSE    Unit Number [id number]    Blood Component Type THAWED PLASMA    Unit division 00    Status of Unit ISSUED,FINAL    Transfusion Status OK TO TRANSFUSE    Unit Number [id number]    Blood Component Type THAWED PLASMA    Unit  division 00    Status of Unit ISSUED,FINAL    Transfusion Status OK TO TRANSFUSE    Unit Number [id number]    Blood Component Type THAWED PLASMA    Unit division 00    Status of Unit ISSUED,FINAL    Transfusion Status OK TO TRANSFUSE    Unit Number [id number]    Blood Component Type THAWED PLASMA    Unit division 00    Status of Unit ISSUED,FINAL    Transfusion Status OK TO TRANSFUSE    Unit Number [id number]    Blood Component Type THAWED PLASMA    Unit division 00    Status of Unit ISSUED,FINAL    Transfusion Status OK TO TRANSFUSE    Unit Number [id number]    Blood Component Type THAWED PLASMA    Unit division 00    Status of Unit REL FROM [facility]    Transfusion Status OK TO TRANSFUSE    Unit Number [id number]    Blood Component Type [place], THAWED    Unit division 00    Status of Unit ISSUED,FINAL    Transfusion Status OK TO TRANSFUSE    Unit Number [id number]    Blood Component Type FFPT, PHER 2    Unit division 00    Status of Unit ISSUED,FINAL    Transfusion Status OK TO TRANSFUSE    Unit Number [id number]    Blood Component Type THAWED PLASMA    Unit division 00    Status of Unit REL FROM [facility]    Transfusion Status OK TO TRANSFUSE    Unit Number [id number]    Blood Component Type THAWED PLASMA    Unit division 00    Status of Unit ISSUED,FINAL    Transfusion Status OK TO TRANSFUSE    Unit Number [id number]    Blood Component Type THAWED PLASMA    Unit division 00    Status of Unit ISSUED,FINAL    Transfusion Status OK TO TRANSFUSE    Unit Number [id number]    Blood Component Type THAWED PLASMA    Unit division 00    Status of Unit REL FROM [facility]    Transfusion Status OK TO TRANSFUSE    Unit Number [id number]    Blood Component Type THAWED PLASMA    Unit division 00    Status of Unit REL FROM [facility]    Transfusion Status OK TO TRANSFUSE   Prepare Pheresed Platelets     Status: None   Collection Time: 12/26/15  5:29 PM    Result Value Ref Range   Unit Number [id number]    Blood Component Type PLTP LR1 PAS    Unit division 00    Status of Unit ISSUED,FINAL    Transfusion Status OK TO TRANSFUSE  Unit Number [id number]    Blood Component Type PLTPHER LI2    Unit division 00    Status of Unit ISSUED,FINAL    Transfusion Status OK TO TRANSFUSE   Troponin I (q 6hr x 3)     Status: None   Collection Time: 12/26/15  5:40 PM  Result Value Ref Range   Troponin I 0.03 <0.031 ng/mL    Comment:        NO INDICATION OF MYOCARDIAL INJURY.   CBC     Status: Abnormal   Collection Time: 12/26/15  5:40 PM  Result Value Ref Range   WBC 20.3 (H) 4.0 - 10.5 K/uL   RBC 3.64 (L) 3.87 - 5.11 MIL/uL   Hemoglobin 10.7 (L) 12.0 - 15.0 g/dL    Comment: REPEATED TO VERIFY POST TRANSFUSION SPECIMEN    HCT 31.6 (L) 36.0 - 46.0 %   MCV 86.8 78.0 - 100.0 fL   MCH 29.4 26.0 - 34.0 pg   MCHC 33.9 30.0 - 36.0 g/dL   RDW 16.5 (H) 11.5 - 15.5 %   Platelets 87 (L) 150 - 400 K/uL    Comment: SPECIMEN CHECKED FOR CLOTS REPEATED TO VERIFY PLATELET COUNT CONFIRMED BY SMEAR POST TRANSFUSION SPECIMEN   APTT     Status: None   Collection Time: 12/26/15  5:40 PM  Result Value Ref Range   aPTT 29 24 - 37 seconds  Protime-INR     Status: Abnormal   Collection Time: 12/26/15  5:40 PM  Result Value Ref Range   Prothrombin Time 22.6 (H) 11.6 - 15.2 seconds   INR 2.01 (H) 0.00 - 1.49  Fibrinogen     Status: Abnormal   Collection Time: 12/26/15  5:40 PM  Result Value Ref Range   Fibrinogen 113 (L) 204 - 475 mg/dL  Initiate MTP (Blood Bank Notification)     Status: None   Collection Time: 12/26/15  5:55 PM  Result Value Ref Range   Initiate Massive Transfusion Protocol MTP ORDER RECEIVED   I-STAT 7, (LYTES, BLD GAS, ICA, H+H)     Status: Abnormal   Collection Time: 12/26/15  6:55 PM  Result Value Ref Range   pH, Arterial 7.268 (L) 7.350 - 7.450   pCO2 arterial 38.5 35.0 - 45.0 mmHg   pO2, Arterial 465.0 (H) 80.0 -  100.0 mmHg   Bicarbonate 17.6 (L) 20.0 - 24.0 mEq/L   TCO2 19 0 - 100 mmol/L   O2 Saturation 100.0 %   Acid-base deficit 9.0 (H) 0.0 - 2.0 mmol/L   Sodium 140 135 - 145 mmol/L   Potassium 5.1 3.5 - 5.1 mmol/L   Calcium, Ion 0.81 (L) 1.13 - 1.30 mmol/L   HCT 33.0 (L) 36.0 - 46.0 %   Hemoglobin 11.2 (L) 12.0 - 15.0 g/dL   Patient temperature HIDE    Sample type ARTERIAL   Prepare cryoprecipitate     Status: None   Collection Time: 12/26/15  7:06 PM  Result Value Ref Range   Unit Number [id number]    Blood Component Type CRYPOOL THAW    Unit division 00    Status of Unit ISSUED,FINAL    Transfusion Status OK TO TRANSFUSE    Unit Number [id number]    Blood Component Type CRYPOOL THAW    Unit division 00    Status of Unit ISSUED,FINAL    Transfusion Status OK TO TRANSFUSE   Glucose, capillary     Status: Abnormal   Collection Time: 12/26/15  8:36 PM  Result Value Ref Range   Glucose-Capillary 209 (H) 65 - 99 mg/dL  MRSA PCR Screening     Status: None   Collection Time: 12/26/15  9:00 PM  Result Value Ref Range   MRSA by PCR NEGATIVE NEGATIVE    Comment:        The GeneXpert MRSA Assay (FDA approved for NASAL specimens only), is one component of a comprehensive MRSA colonization surveillance program. It is not intended to diagnose MRSA infection nor to guide or monitor treatment for MRSA infections.   Glucose, capillary     Status: Abnormal   Collection Time: 12/26/15 11:58 PM  Result Value Ref Range   Glucose-Capillary 212 (H) 65 - 99 mg/dL  Troponin I (q 6hr x 3)     Status: Abnormal   Collection Time: 12/27/15 12:00 AM  Result Value Ref Range   Troponin I 0.62 (HH) <0.031 ng/mL    Comment:        POSSIBLE MYOCARDIAL ISCHEMIA. SERIAL TESTING RECOMMENDED. CRITICAL RESULT CALLED TO, READ BACK BY AND VERIFIED WITH: WILLIS,S RN 12/27/2015 0228 JORDANS   Triglycerides     Status: None   Collection Time: 12/27/15 12:00 AM  Result Value Ref Range    Triglycerides 81 <150 mg/dL  CBC     Status: Abnormal   Collection Time: 12/27/15 12:00 AM  Result Value Ref Range   WBC 8.3 4.0 - 10.5 K/uL   RBC 4.02 3.87 - 5.11 MIL/uL   Hemoglobin 11.6 (L) 12.0 - 15.0 g/dL   HCT 34.3 (L) 36.0 - 46.0 %   MCV 85.3 78.0 - 100.0 fL   MCH 28.9 26.0 - 34.0 pg   MCHC 33.8 30.0 - 36.0 g/dL   RDW 14.5 11.5 - 15.5 %   Platelets 75 (L) 150 - 400 K/uL    Comment: CONSISTENT WITH PREVIOUS RESULT  Comprehensive metabolic panel     Status: Abnormal   Collection Time: 12/27/15 12:00 AM  Result Value Ref Range   Sodium 138 135 - 145 mmol/L   Potassium 3.6 3.5 - 5.1 mmol/L    Comment: DELTA CHECK NOTED   Chloride 108 101 - 111 mmol/L   CO2 22 22 - 32 mmol/L   Glucose, Bld 196 (H) 65 - 99 mg/dL   BUN 12 6 - 20 mg/dL   Creatinine, Ser 0.75 0.44 - 1.00 mg/dL   Calcium 8.9 8.9 - 10.3 mg/dL   Total Protein 6.0 (L) 6.5 - 8.1 g/dL   Albumin 3.5 3.5 - 5.0 g/dL   AST 51 (H) 15 - 41 U/L   ALT 28 14 - 54 U/L   Alkaline Phosphatase 66 38 - 126 U/L   Total Bilirubin 1.2 0.3 - 1.2 mg/dL   GFR calc non Af Amer >60 >60 mL/min   GFR calc Af Amer >60 >60 mL/min    Comment: (NOTE) The eGFR has been calculated using the CKD EPI equation. This calculation has not been validated in all clinical situations. eGFR's persistently <60 mL/min signify possible Chronic Kidney Disease.    Anion gap 8 5 - 15  Protime-INR     Status: Abnormal   Collection Time: 12/27/15 12:00 AM  Result Value Ref Range   Prothrombin Time 16.8 (H) 11.6 - 15.2 seconds   INR 1.35 0.00 - 1.49  Fibrinogen     Status: None   Collection Time: 12/27/15 12:00 AM  Result Value Ref Range   Fibrinogen 261 204 - 475 mg/dL  Lactic acid, plasma  Status: Abnormal   Collection Time: 12/27/15 12:40 AM  Result Value Ref Range   Lactic Acid, Venous 2.4 (HH) 0.5 - 2.0 mmol/L    Comment: CRITICAL RESULT CALLED TO, READ BACK BY AND VERIFIED WITH: WILLIS,S RN 12/27/2015 0227 JORDANS   Troponin I (q 6hr x 3)      Status: Abnormal   Collection Time: 12/27/15  4:00 AM  Result Value Ref Range   Troponin I 1.20 (HH) <0.031 ng/mL    Comment:        POSSIBLE MYOCARDIAL ISCHEMIA. SERIAL TESTING RECOMMENDED. CRITICAL VALUE NOTED.  VALUE IS CONSISTENT WITH PREVIOUSLY REPORTED AND CALLED VALUE.   Protime-INR     Status: Abnormal   Collection Time: 12/27/15  4:00 AM  Result Value Ref Range   Prothrombin Time 17.0 (H) 11.6 - 15.2 seconds   INR 1.37 0.00 - 1.49  Comprehensive metabolic panel     Status: Abnormal   Collection Time: 12/27/15  4:00 AM  Result Value Ref Range   Sodium 140 135 - 145 mmol/L   Potassium 3.6 3.5 - 5.1 mmol/L   Chloride 110 101 - 111 mmol/L   CO2 22 22 - 32 mmol/L   Glucose, Bld 145 (H) 65 - 99 mg/dL   BUN 14 6 - 20 mg/dL   Creatinine, Ser 0.78 0.44 - 1.00 mg/dL   Calcium 8.4 (L) 8.9 - 10.3 mg/dL   Total Protein 5.6 (L) 6.5 - 8.1 g/dL   Albumin 3.1 (L) 3.5 - 5.0 g/dL   AST 45 (H) 15 - 41 U/L   ALT 25 14 - 54 U/L   Alkaline Phosphatase 59 38 - 126 U/L   Total Bilirubin 1.1 0.3 - 1.2 mg/dL   GFR calc non Af Amer >60 >60 mL/min   GFR calc Af Amer >60 >60 mL/min    Comment: (NOTE) The eGFR has been calculated using the CKD EPI equation. This calculation has not been validated in all clinical situations. eGFR's persistently <60 mL/min signify possible Chronic Kidney Disease.    Anion gap 8 5 - 15  Hemoglobin and hematocrit, blood     Status: Abnormal   Collection Time: 12/27/15  4:00 AM  Result Value Ref Range   Hemoglobin 11.1 (L) 12.0 - 15.0 g/dL   HCT 32.3 (L) 36.0 - 46.0 %  CBC     Status: Abnormal   Collection Time: 12/27/15  7:43 AM  Result Value Ref Range   WBC 7.2 4.0 - 10.5 K/uL   RBC 3.65 (L) 3.87 - 5.11 MIL/uL   Hemoglobin 10.7 (L) 12.0 - 15.0 g/dL   HCT 30.7 (L) 36.0 - 46.0 %   MCV 84.1 78.0 - 100.0 fL   MCH 29.3 26.0 - 34.0 pg   MCHC 34.9 30.0 - 36.0 g/dL   RDW 14.9 11.5 - 15.5 %   Platelets 61 (L) 150 - 400 K/uL    Comment: CONSISTENT WITH  PREVIOUS RESULT  Troponin I (q 6hr x 3)     Status: Abnormal   Collection Time: 12/27/15 10:37 AM  Result Value Ref Range   Troponin I 2.00 (HH) <0.031 ng/mL    Comment:        POSSIBLE MYOCARDIAL ISCHEMIA. SERIAL TESTING RECOMMENDED. CRITICAL VALUE NOTED.  VALUE IS CONSISTENT WITH PREVIOUSLY REPORTED AND CALLED VALUE.   Hemoglobin and hematocrit, blood     Status: Abnormal   Collection Time: 12/27/15  3:16 PM  Result Value Ref Range   Hemoglobin 10.5 (L) 12.0 - 15.0  g/dL   HCT 30.5 (L) 36.0 - 46.0 %  Troponin I (q 6hr x 3)     Status: Abnormal   Collection Time: 12/27/15  3:26 PM  Result Value Ref Range   Troponin I 2.06 (HH) <0.031 ng/mL    Comment:        POSSIBLE MYOCARDIAL ISCHEMIA. SERIAL TESTING RECOMMENDED. CRITICAL VALUE NOTED.  VALUE IS CONSISTENT WITH PREVIOUSLY REPORTED AND CALLED VALUE.     Dg Chest 1 View  12/25/2015  CLINICAL DATA:  Recent fall today with left hip pain, initial encounter EXAM: CHEST 1 VIEW COMPARISON:  None. FINDINGS: Cardiac shadow is within normal limits. The lungs are well aerated bilaterally. Mild interstitial changes are seen without focal infiltrate. No acute bony abnormality is seen. IMPRESSION: Mild interstitial changes likely of a chronic nature. No acute abnormality seen. Electronically Signed   By: Inez Catalina M.D.   On: 12/25/2015 21:17   Dg Lumbar Spine Complete  12/25/2015  CLINICAL DATA:  Fall today with low back pain, initial encounter EXAM: LUMBAR SPINE - COMPLETE 4+ VIEW COMPARISON:  None. FINDINGS: Five lumbar type vertebral bodies are well visualized. A mild scoliosis concave to the right is noted. Reactive endplate changes are noted. No anterolisthesis is seen. No soft tissue abnormality is noted. IMPRESSION: Degenerative change without acute abnormality. Electronically Signed   By: Inez Catalina M.D.   On: 12/25/2015 21:17   Ct Angio Chest Pe W Or Wo Contrast  12/27/2015  CLINICAL DATA:  Shortness of breath. Atrial mass versus  blood clot seen on cardiac echo. EXAM: CT ANGIOGRAPHY CHEST WITH CONTRAST TECHNIQUE: Multidetector CT imaging of the chest was performed using the standard protocol during bolus administration of intravenous contrast. Multiplanar CT image reconstructions and MIPs were obtained to evaluate the vascular anatomy. CONTRAST:  80 cc Isovue 370 intravenously. COMPARISON:  Chest radiograph 12/26/2015 FINDINGS: Mediastinum/Lymph Nodes: Left subclavian approach central venous catheter terminates in the superior vena cava. There are bilateral pulmonary emboli. There is a nearly completely occlusive thrombus within the left lower lobar pulmonary artery, with clot extending to the basilar segmental branches. Nonocclusive clot is seen within the left upper, lingular lobar pulmonary artery, right upper, right middle and right lower lobe pulmonary arteries. There is evidence of right heart strain with right/left ventricular ratio over 1. Irregular hypoattenuated area within the right atrium may represent mixing of non-opacified blood from the inferior vena cava with contrast opacified blood, versus an intramural thrombus. No masses or pathologically enlarged lymph nodes identified. Lungs/Pleura: There are bilateral moderate in size pleural effusions. There is segmental atelectasis of the right lower lobe and subsegmental atelectasis of the left lower lobe. Upper abdomen: Water density abdominal ascites. Musculoskeletal: No chest wall mass or suspicious bone lesions identified. Review of the MIP images confirms the above findings. IMPRESSION: Bilateral pulmonary emboli, nearly occlusive in the left lower lobe segmental branches, involving all of the segmental pulmonary arteries. Evidence of right heart strain with right/left heart ratio of over 1. Heterogeneous area of hypoattenuation within the right atrium may represent right atrial clot versus mixing of non-opacified blood from the inferior vena cava. Given presence of extensive  bilateral pulmonary emboli, presence of atrial clot becomes more plausible. If clinically important, gated cardiac CT or cardiac MRI may be pursued. Bilateral pleural effusions and atelectatic changes of bilateral lung bases. Abdominal ascites. Critical Value/emergent results were called by telephone at the time of interpretation on 12/27/2015 at 7:06 pm to Dr. Halford Chessman, who verbally acknowledged these  results. Electronically Signed   By: Fidela Salisbury M.D.   On: 12/27/2015 19:19   Ct Pelvis W Contrast  12/26/2015  CLINICAL DATA:  75 year old with large pelvic hematoma following left hip replacement. Evaluate for pelvic bleeding. EXAM: CT PELVIS WITH CONTRAST TECHNIQUE: Multidetector CT imaging of the pelvis was performed using the standard protocol following the bolus administration of intravenous contrast. CONTRAST:  100 mL Isovue COMPARISON:  None. FINDINGS: Vascular structures: The distal abdominal aorta and common iliac arteries are patent. There is active contrast extravasation in the left hemipelvis originating from the proximal left common femoral artery or distal left external iliac artery. Bleeding is originating near the origin of the left inferior epigastric artery but may be separate from this vessel. The left common femoral artery and the proximal left femoral arteries are patent. The right iliac arteries and right femoral arteries are patent. The delayed images demonstrate additional contrast extravasation within the large hematoma within the anterior pelvis and lower abdomen. There is fluid tracking into the left abdomen. There is fluid and gas tracking up the left iliacus muscle. Expected gas around the left hip from the recent hip replacement. Fluid in the pelvis. Limited evaluation of pelvic structures due to the large amount of blood. The left hip arthroplasty is located. 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 IMPRESSION: Active bleeding in the pelvis. The bleeding is originating  from the proximal left common femoral artery or the distal left external iliac artery. The bleeding is near the origin of the left inferior epigastric artery. Large amount of blood and hematoma formation throughout the abdomen and pelvis. Expected postsurgical changes from left hip arthroplasty. Electronically Signed   By: Markus Daft M.D.   On: 12/26/2015 18:55   Ir Angiogram Extremity Left  12/26/2015  INDICATION: 75 year old female with a history of a hip arthroplasty and hypotension. EXAM: IR EMBO ART VEN HEMORR LYMPH EXTRAV INC GUIDE ROADMAPPING; IR ULTRASOUND GUIDANCE VASC ACCESS RIGHT; ARTERIOGRAPHY; PELVIC SELECTIVE ARTERIOGRAPHY; LEFT EXTREMITY ARTERIOGRAPHY; ADDITIONAL ARTERIOGRAPHY MEDICATIONS: None ANESTHESIA/SEDATION: Patient was intubated. Anesthesia team managed the endotracheal airway CONTRAST:  50 cc Isovue FLUOROSCOPY TIME:  Fluoroscopy Time: 5 minutes 42 seconds COMPLICATIONS: None PROCEDURE: Informed consent was obtained from the patient following explanation of the procedure, risks, benefits and alternatives. The patient understands, agrees and consents for the procedure. All questions were addressed. A time out was performed prior to the initiation of the procedure. Maximal barrier sterile technique utilized including caps, mask, sterile gowns, sterile gloves, large sterile drape, hand hygiene, and Betadine prep. Patient positioned supine position on the fluoroscopy table. The right inguinal region was prepped and draped in the usual sterile fashion. Ultrasound survey of the right inguinal region was performed with images stored and sent to PACs. A micropuncture needle was used access the right common femoral artery under ultrasound. With excellent arterial blood flow returned, and an .018 micro wire was passed through the needle, observed enter the abdominal aorta under fluoroscopy. The needle was removed, and a micropuncture sheath was placed over the wire. The inner dilator and wire were  removed, and an 035 Bentson wire was advanced under fluoroscopy into the abdominal aorta. The sheath was removed and a standard 5 [place] vascular sheath was placed. The dilator was removed and the sheath was flushed. Omni Flush catheter was used to navigate a Bentson wire over the aortic bifurcation into the left iliac system. Catheter was exchanged for a C2 catheter advanced into the hypogastric artery with an angiogram performed. Catheter  was withdrawn and redirected into the external iliac artery. Angiogram was performed. A Rosen wire was advanced into the proximal femoral system and the catheter was withdrawn. Six French right tip sheath was advanced over the bifurcation into the left iliac system. 7 mm x 4 cm balloon was advanced over the Rosen wire into the proximal left external iliac artery. Balloon was gently inflated to low atmospheric pressure, with inflation of approximately 4 minutes. At this time, measurements of the left external iliac artery were performed. Balloon was deflated, the balloon catheter was advanced into the proximal left femoral system, and the 035 Rosen wire was exchanged for an 018 S-V8 wire. Balloon catheter was removed. A 64m x 554mViabahn stent graft was select thinned and advanced over the 018 wire to the targeted the avulsed artery. Stent graft was deployed. Balloon angioplasty to the nominal diameter of 6 mm was performed. Final angiogram was performed. The 6 French 35 cm right tip sheath was exchanged for a standard 10 cm 6 French sheath. Sheath was elected to remain given the patient's coagulopathy. Patient tolerated the procedure well with no significant blood loss. IMPRESSION: Status post left lower extremity angiogram with emergent covered stent embolization of distal external iliac artery branch vessel pseudoaneurysm/avulsion, with placement of stent graft. Signed, JaDulcy FannyWaEarleen NewportDO [facility] Electronically  Signed   By: JaCorrie Mckusick.O.   On: 12/26/2015 22:45   Ir Angiogram Pelvis Selective Or Supraselective  12/26/2015  INDICATION: [id number] old female with a history of a hip arthroplasty and hypotension. EXAM: IR EMBO ART VEN HEMORR LYMPH EXTRAV INC GUIDE ROADMAPPING; IR ULTRASOUND GUIDANCE VASC ACCESS RIGHT; ARTERIOGRAPHY; PELVIC SELECTIVE ARTERIOGRAPHY; LEFT EXTREMITY ARTERIOGRAPHY; ADDITIONAL ARTERIOGRAPHY MEDICATIONS: None ANESTHESIA/SEDATION: Patient was intubated. Anesthesia team managed the endotracheal airway CONTRAST:  50 cc Isovue FLUOROSCOPY TIME:  Fluoroscopy Time: 5 minutes 42 seconds COMPLICATIONS: None PROCEDURE: Informed consent was obtained from the patient following explanation of the procedure, risks, benefits and alternatives. The patient understands, agrees and consents for the procedure. All questions were addressed. A time out was performed prior to the initiation of the procedure. Maximal barrier sterile technique utilized including caps, mask, sterile gowns, sterile gloves, large sterile drape, hand hygiene, and Betadine prep. Patient positioned supine position on the fluoroscopy table. The right inguinal region was prepped and draped in the usual sterile fashion. Ultrasound survey of the right inguinal region was performed with images stored and sent to PACs. A micropuncture needle was used access the right common femoral artery under ultrasound. With excellent arterial blood flow returned, and an .018 micro wire was passed through the needle, observed enter the abdominal aorta under fluoroscopy. The needle was removed, and a micropuncture sheath was placed over the wire. The inner dilator and wire were removed, and an 035 Bentson wire was advanced under fluoroscopy into the abdominal aorta. The sheath was removed and a standard 5 [place] sheath was placed. The dilator was removed and the sheath was flushed. Omni Flush catheter was used to navigate a Bentson wire over the aortic  bifurcation into the left iliac system. Catheter was exchanged for a C2 catheter advanced into the hypogastric artery with an angiogram performed. Catheter was withdrawn and redirected into the external iliac artery. Angiogram was performed. A Rosen wire was advanced into the proximal femoral system and the catheter was withdrawn. Six French right tip sheath was advanced over the bifurcation into the left iliac system. 7 mm x 4 cm balloon was  advanced over the [facility] wire into the proximal left external iliac artery. Balloon was gently inflated to low atmospheric pressure, with inflation of approximately 4 minutes. At this time, measurements of the left external iliac artery were performed. Balloon was deflated, the balloon catheter was advanced into the proximal left femoral system, and the 035 Rosen wire was exchanged for an 018 S-V8 wire. Balloon catheter was removed. A 65m x 547mViabahn stent graft was select thinned and advanced over the 018 wire to the targeted the avulsed artery. Stent graft was deployed. Balloon angioplasty to the nominal diameter of 6 mm was performed. Final angiogram was performed. The 6 French 35 cm right tip sheath was exchanged for a standard 10 cm 6 French sheath. Sheath was elected to remain given the patient's coagulopathy. Patient tolerated the procedure well with no significant blood loss. IMPRESSION: Status post left lower extremity angiogram with emergent covered stent embolization of distal external iliac artery branch vessel pseudoaneurysm/avulsion, with placement of stent graft. Signed, JaDulcy FannyWaEarleen NewportDO [facility] Electronically Signed   By: JaCorrie Mckusick.O.   On: 12/26/2015 22:45   Ir Angiogram Selective Each Additional Vessel  12/26/2015  INDICATION: [id number] old female with a history of a hip arthroplasty and hypotension. EXAM: IR EMBO ART VEN HEMORR LYMPH EXTRAV INC GUIDE ROADMAPPING; IR ULTRASOUND GUIDANCE  VASC ACCESS RIGHT; ARTERIOGRAPHY; PELVIC SELECTIVE ARTERIOGRAPHY; LEFT EXTREMITY ARTERIOGRAPHY; ADDITIONAL ARTERIOGRAPHY MEDICATIONS: None ANESTHESIA/SEDATION: Patient was intubated. Anesthesia team managed the endotracheal airway CONTRAST:  50 cc Isovue FLUOROSCOPY TIME:  Fluoroscopy Time: 5 minutes 42 seconds COMPLICATIONS: None PROCEDURE: Informed consent was obtained from the patient following explanation of the procedure, risks, benefits and alternatives. The patient understands, agrees and consents for the procedure. All questions were addressed. A time out was performed prior to the initiation of the procedure. Maximal barrier sterile technique utilized including caps, mask, sterile gowns, sterile gloves, large sterile drape, hand hygiene, and Betadine prep. Patient positioned supine position on the fluoroscopy table. The right inguinal region was prepped and draped in the usual sterile fashion. Ultrasound survey of the right inguinal region was performed with images stored and sent to PACs. A micropuncture needle was used access the right common femoral artery under ultrasound. With excellent arterial blood flow returned, and an .018 micro wire was passed through the needle, observed enter the abdominal aorta under fluoroscopy. The needle was removed, and a micropuncture sheath was placed over the wire. The inner dilator and wire were removed, and an 035 Bentson wire was advanced under fluoroscopy into the abdominal aorta. The sheath was removed and a standard 5 [place] sheath was placed. The dilator was removed and the sheath was flushed. Omni Flush catheter was used to navigate a Bentson wire over the aortic bifurcation into the left iliac system. Catheter was exchanged for a C2 catheter advanced into the hypogastric artery with an angiogram performed. Catheter was withdrawn and redirected into the external iliac artery. Angiogram was performed. A Rosen wire was advanced into the proximal femoral  system and the catheter was withdrawn. Six French right tip sheath was advanced over the bifurcation into the left iliac system. 7 mm x 4 cm balloon was advanced over the Rosen wire into the proximal left external iliac artery. Balloon was gently inflated to low atmospheric pressure, with inflation of approximately 4 minutes. At this time, measurements of the left external iliac artery were performed. Balloon was deflated, the balloon catheter was advanced into the proximal  left femoral system, and the 035 Rosen wire was exchanged for an 018 S-V8 wire. Balloon catheter was removed. A 74m x 512mViabahn stent graft was select thinned and advanced over the 018 wire to the targeted the avulsed artery. Stent graft was deployed. Balloon angioplasty to the nominal diameter of 6 mm was performed. Final angiogram was performed. The 6 French 35 cm right tip sheath was exchanged for a standard 10 cm 6 French sheath. Sheath was elected to remain given the patient's coagulopathy. Patient tolerated the procedure well with no significant blood loss. IMPRESSION: Status post left lower extremity angiogram with emergent covered stent embolization of distal external iliac artery branch vessel pseudoaneurysm/avulsion, with placement of stent graft. Signed, JaDulcy FannyWaEarleen NewportDO [facility] Electronically Signed   By: JaCorrie Mckusick.O.   On: 12/26/2015 22:45   Ir Angiogram Follow Up Study  12/26/2015  INDICATION: 89ear old female with a history of a hip arthroplasty and hypotension. EXAM: IR EMBO ART VEN HEMORR LYMPH EXTRAV INC GUIDE ROADMAPPING; IR ULTRASOUND GUIDANCE VASC ACCESS RIGHT; ARTERIOGRAPHY; PELVIC SELECTIVE ARTERIOGRAPHY; LEFT EXTREMITY ARTERIOGRAPHY; ADDITIONAL ARTERIOGRAPHY MEDICATIONS: None ANESTHESIA/SEDATION: Patient was intubated. Anesthesia team managed the endotracheal airway CONTRAST:  50 cc Isovue FLUOROSCOPY TIME:  Fluoroscopy Time: 5 minutes 42  seconds COMPLICATIONS: None PROCEDURE: Informed consent was obtained from the patient following explanation of the procedure, risks, benefits and alternatives. The patient understands, agrees and consents for the procedure. All questions were addressed. A time out was performed prior to the initiation of the procedure. Maximal barrier sterile technique utilized including caps, mask, sterile gowns, sterile gloves, large sterile drape, hand hygiene, and Betadine prep. Patient positioned supine position on the fluoroscopy table. The right inguinal region was prepped and draped in the usual sterile fashion. Ultrasound survey of the right inguinal region was performed with images stored and sent to PACs. A micropuncture needle was used access the right common femoral artery under ultrasound. With excellent arterial blood flow returned, and an .018 micro wire was passed through the needle, observed enter the abdominal aorta under fluoroscopy. The needle was removed, and a micropuncture sheath was placed over the wire. The inner dilator and wire were removed, and an 035 Bentson wire was advanced under fluoroscopy into the abdominal aorta. The sheath was removed and a standard 5 [place] sheath was placed. The dilator was removed and the sheath was flushed. Omni Flush catheter was used to navigate a Bentson wire over the aortic bifurcation into the left iliac system. Catheter was exchanged for a C2 catheter advanced into the hypogastric artery with an angiogram performed. Catheter was withdrawn and redirected into the external iliac artery. Angiogram was performed. A Rosen wire was advanced into the proximal femoral system and the catheter was withdrawn. Six French right tip sheath was advanced over the bifurcation into the left iliac system. 7 mm x 4 cm balloon was advanced over the Rosen wire into the proximal left external iliac artery. Balloon was gently inflated to low atmospheric pressure, with inflation of  approximately 4 minutes. At this time, measurements of the left external iliac artery were performed. Balloon was deflated, the balloon catheter was advanced into the proximal left femoral system, and the 035 Rosen wire was exchanged for an 018 S-V8 wire. Balloon catheter was removed. A 55m18m 45m68mabahn stent graft was select thinned and advanced over the 018 wire to the targeted the avulsed artery. Stent graft was deployed. Balloon angioplasty to the nominal  diameter of 6 mm was performed. Final angiogram was performed. The 6 French 35 cm right tip sheath was exchanged for a standard 10 cm 6 French sheath. Sheath was elected to remain given the patient's coagulopathy. Patient tolerated the procedure well with no significant blood loss. IMPRESSION: Status post left lower extremity angiogram with emergent covered stent embolization of distal external iliac artery branch vessel pseudoaneurysm/avulsion, with placement of stent graft. Signed, Dulcy Fanny. Earleen Newport, DO [facility] Electronically Signed   By: Corrie Mckusick D.O.   On: 12/26/2015 22:45   Ir US Guide Vasc Access Right  12/26/2015  INDICATION: 75 year old female with a history of a hip arthroplasty and hypotension. EXAM: IR EMBO ART VEN HEMORR LYMPH EXTRAV INC GUIDE ROADMAPPING; IR ULTRASOUND GUIDANCE VASC ACCESS RIGHT; ARTERIOGRAPHY; PELVIC SELECTIVE ARTERIOGRAPHY; LEFT EXTREMITY ARTERIOGRAPHY; ADDITIONAL ARTERIOGRAPHY MEDICATIONS: None ANESTHESIA/SEDATION: Patient was intubated. Anesthesia team managed the endotracheal airway CONTRAST:  50 cc Isovue FLUOROSCOPY TIME:  Fluoroscopy Time: 5 minutes 42 seconds COMPLICATIONS: None PROCEDURE: Informed consent was obtained from the patient following explanation of the procedure, risks, benefits and alternatives. The patient understands, agrees and consents for the procedure. All questions were addressed. A time out was performed prior to the initiation of  the procedure. Maximal barrier sterile technique utilized including caps, mask, sterile gowns, sterile gloves, large sterile drape, hand hygiene, and Betadine prep. Patient positioned supine position on the fluoroscopy table. The right inguinal region was prepped and draped in the usual sterile fashion. Ultrasound survey of the right inguinal region was performed with images stored and sent to PACs. A micropuncture needle was used access the right common femoral artery under ultrasound. With excellent arterial blood flow returned, and an .018 micro wire was passed through the needle, observed enter the abdominal aorta under fluoroscopy. The needle was removed, and a micropuncture sheath was placed over the wire. The inner dilator and wire were removed, and an 035 Bentson wire was advanced under fluoroscopy into the abdominal aorta. The sheath was removed and a standard 5 [place] vascular sheath was placed. The dilator was removed and the sheath was flushed. Omni Flush catheter was used to navigate a Bentson wire over the aortic bifurcation into the left iliac system. Catheter was exchanged for a C2 catheter advanced into the hypogastric artery with an angiogram performed. Catheter was withdrawn and redirected into the external iliac artery. Angiogram was performed. A Rosen wire was advanced into the proximal femoral system and the catheter was withdrawn. Six French right tip sheath was advanced over the bifurcation into the left iliac system. 7 mm x 4 cm balloon was advanced over the Rosen wire into the proximal left external iliac artery. Balloon was gently inflated to low atmospheric pressure, with inflation of approximately 4 minutes. At this time, measurements of the left external iliac artery were performed. Balloon was deflated, the balloon catheter was advanced into the proximal left femoral system, and the 035 Rosen wire was exchanged for an 018 S-V8 wire. Balloon catheter was removed. A 50m x 518mViabahn  stent graft was select thinned and advanced over the 018 wire to the targeted the avulsed artery. Stent graft was deployed. Balloon angioplasty to the nominal diameter of 6 mm was performed. Final angiogram was performed. The 6 French 35 cm right tip sheath was exchanged for a standard 10 cm 6 French sheath. Sheath was elected to remain given the patient's coagulopathy. Patient tolerated the procedure well with no significant blood loss. IMPRESSION: Status  post left lower extremity angiogram with emergent covered stent embolization of distal external iliac artery branch vessel pseudoaneurysm/avulsion, with placement of stent graft. Signed, Dulcy Fanny. Earleen Newport, DO [facility] Radiology Electronically Signed   By: Corrie Mckusick D.O.   On: 12/26/2015 22:45   Dg Chest Port 1 View  12/26/2015  CLINICAL DATA:  Patient status post ET tube placement. EXAM: PORTABLE CHEST 1 VIEW COMPARISON:  Chest radiograph 12/25/2015. FINDINGS: ET tube terminates in the mid trachea. Left subclavian central venous catheter tip projects over the superior vena cava. Enteric tube tip and side port terminates in the midesophagus, recommend advancement. Stable cardiac and mediastinal contours. Low lung volumes. Elevation of the right hemidiaphragm. Right-greater-than-left interstitial pulmonary opacities. No large pleural effusion or pneumothorax. IMPRESSION: ET tube tip and side port terminate in the midesophagus, recommend advancement. Bilateral, left-greater-than-right, interstitial opacities which may represent pulmonary edema. These results will be called to the ordering clinician or representative by the Radiologist Assistant, and communication documented in the PACS or zVision Dashboard. Electronically Signed   By: Lovey Newcomer M.D.   On: 12/26/2015 21:07   Dg Hip Operative Unilat W Or W/o Pelvis Left  12/26/2015  CLINICAL DATA:  Left anterior hip replacement due to fracture. EXAM:  OPERATIVE LEFT HIP (WITH PELVIS IF PERFORMED) 1 VIEW TECHNIQUE: Fluoroscopic spot image(s) were submitted for interpretation post-operatively. COMPARISON:  12/25/2015 FINDINGS: A total left hip arthroplasty has been performed. Alignment of the prosthesis is grossly normal on this single view. No evidence for a periprosthetic fracture. IMPRESSION: Left hip replacement without complicating features. Electronically Signed   By: Markus Daft M.D.   On: 12/26/2015 16:34   Dg Hip Unilat With Pelvis 2-3 Views Left  12/25/2015  CLINICAL DATA:  Recent fall with hip pain, initial encounter EXAM: DG HIP (WITH OR WITHOUT PELVIS) 2-3V LEFT COMPARISON:  None. FINDINGS: There is left femoral neck fracture with impaction and angulation at the fracture site. Pelvic ring is intact. No other focal abnormality is seen. IMPRESSION: Left femoral neck fracture. Electronically Signed   By: Inez Catalina M.D.   On: 12/25/2015 21:16   Dg Femur 1v Left  12/25/2015  CLINICAL DATA:  [place] with left hip pain. EXAM: LEFT FEMUR 1 VIEW COMPARISON:  Left hip 12/25/2015 FINDINGS: Single view of the left femur was obtained. There is a fracture involving the proximal left femur at the junction of the femoral head and neck. There is superior displacement of the left femoral neck. Findings are suggestive for a subcapital hip fracture. The mid and distal femur appear to be intact on this single view. IMPRESSION: Fracture of the proximal left femur. Findings are compatible with a subcapital hip fracture. Electronically Signed   By: Markus Daft M.D.   On: 12/25/2015 21:17   [place] Guide Roadmapping  12/26/2015  INDICATION: 75 year old female with a history of a hip arthroplasty and hypotension. EXAM: IR EMBO ART VEN HEMORR LYMPH EXTRAV INC GUIDE ROADMAPPING; IR ULTRASOUND GUIDANCE VASC ACCESS RIGHT; ARTERIOGRAPHY; PELVIC SELECTIVE ARTERIOGRAPHY; LEFT EXTREMITY ARTERIOGRAPHY; ADDITIONAL ARTERIOGRAPHY MEDICATIONS: None  ANESTHESIA/SEDATION: Patient was intubated. Anesthesia team managed the endotracheal airway CONTRAST:  50 cc Isovue FLUOROSCOPY TIME:  Fluoroscopy Time: 5 minutes 42 seconds COMPLICATIONS: None PROCEDURE: Informed consent was obtained from the patient following explanation of the procedure, risks, benefits and alternatives. The patient understands, agrees and consents for the procedure. All questions were addressed. A time out was performed prior to  the initiation of the procedure. Maximal barrier sterile technique utilized including caps, mask, sterile gowns, sterile gloves, large sterile drape, hand hygiene, and Betadine prep. Patient positioned supine position on the fluoroscopy table. The right inguinal region was prepped and draped in the usual sterile fashion. Ultrasound survey of the right inguinal region was performed with images stored and sent to PACs. A micropuncture needle was used access the right common femoral artery under ultrasound. With excellent arterial blood flow returned, and an .018 micro wire was passed through the needle, observed enter the abdominal aorta under fluoroscopy. The needle was removed, and a micropuncture sheath was placed over the wire. The inner dilator and wire were removed, and an 035 Bentson wire was advanced under fluoroscopy into the abdominal aorta. The sheath was removed and a standard 5 [place] vascular sheath was placed. The dilator was removed and the sheath was flushed. Omni Flush catheter was used to navigate a Bentson wire over the aortic bifurcation into the left iliac system. Catheter was exchanged for a C2 catheter advanced into the hypogastric artery with an angiogram performed. Catheter was withdrawn and redirected into the external iliac artery. Angiogram was performed. A Rosen wire was advanced into the proximal femoral system and the catheter was withdrawn. Six French right tip sheath was advanced over the bifurcation into the left iliac system. 7 mm x 4 cm  balloon was advanced over the Rosen wire into the proximal left external iliac artery. Balloon was gently inflated to low atmospheric pressure, with inflation of approximately 4 minutes. At this time, measurements of the left external iliac artery were performed. Balloon was deflated, the balloon catheter was advanced into the proximal left femoral system, and the 035 Rosen wire was exchanged for an 018 S-V8 wire. Balloon catheter was removed. A 42mm x 71mm Viabahn stent graft was select thinned and advanced over the 018 wire to the targeted the avulsed artery. Stent graft was deployed. Balloon angioplasty to the nominal diameter of 6 mm was performed. Final angiogram was performed. The 6 French 35 cm right tip sheath was exchanged for a standard 10 cm 6 French sheath. Sheath was elected to remain given the patient's coagulopathy. Patient tolerated the procedure well with no significant blood loss. IMPRESSION: Status post left lower extremity angiogram with emergent covered stent embolization of distal external iliac artery branch vessel pseudoaneurysm/avulsion, with placement of stent graft. Signed, Yvone Neu. Loreta Ave, DO [facility] Radiology Electronically Signed   By: Gilmer Mor D.O.   On: 12/26/2015 22:45    Review of Systems  Constitutional: Negative.   HENT: Negative.   Eyes: Negative.   Respiratory: Negative for cough, hemoptysis, sputum production and shortness of breath.   Cardiovascular: Negative for chest pain, palpitations, orthopnea, claudication, leg swelling and PND.  Gastrointestinal: Negative.   Genitourinary: Negative.   Musculoskeletal: Positive for back pain.  Skin: Negative.   Neurological: Negative.   Endo/Heme/Allergies: Negative.   Psychiatric/Behavioral: Negative.    Blood pressure 130/82, pulse 98, temperature 98.3 F (36.8 C), temperature source Oral, resp. rate 24, height 5\' 7"  (1.702 m), weight 73.8 kg (162 lb 11.2 oz),  SpO2 95 %. Physical Exam  Constitutional: She is oriented to person, place, and time. She appears well-developed and well-nourished. No distress.  HENT:  Head: Normocephalic and atraumatic.  Mouth/Throat: Oropharynx is clear and moist.  Eyes: EOM are normal. Pupils are equal, round, and reactive to light.  Neck: JVD present. No thyromegaly present.  Cardiovascular: Normal  rate, regular rhythm and intact distal pulses.   Murmur heard. 2/6 systolic murmur at the apex  Respiratory: Effort normal. No respiratory distress. She has no wheezes.  Decreased breath sounds in the bases  GI: Soft. Bowel sounds are normal. There is no tenderness.  Musculoskeletal: She exhibits no edema.  Lymphadenopathy:    She has no cervical adenopathy.  Neurological: She is alert and oriented to person, place, and time.  Skin: Skin is warm and dry.  Psychiatric: She has a normal mood and affect.    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 This is most likely clot probably from the pelvic veins that may have been compressed by the pelvic hematoma following her vascular injury. This is much less likely to be an atrial tumor like a myxoma with diffuse bilateral emboli. Although atrial tumors can embolize I would think clot is more likely given her recent orthopedic surgery and vascular injury. She also has severe MR due to chronic mitral valve prolapse which certainly could add to the RV strain. She is hemodynamically stable and therefore I would recommend treatment with anticoagulation. Catheter thrombolysis by IR could be considered but she is not a candidate for systemic thrombolysis. Surgical removal of the right atrial clot could be performed but would be high risk given the need for concomitant mitral valve repair and the RV strain that is present already from the bilateral pulmonary emboli. She would be at  high risk of postop RV failure. In addition after open heart surgery she could not be anticoagulated with heparin for several days which may result in propagation of the pulmonary emboli already present and she may potentially develop more pelvic venous thrombus. The lower extremity venous study did not show DVT in the legs but does not tell us anything about the pelvic veins. She could still have thrombus in those veins. Mortality risk is significant no matter how this is treated.  Gaye Pollack 12/27/2015, 7:28 PM

## 2015-12-27 NOTE — Progress Notes (Signed)
  Echocardiogram 2D Echocardiogram has been performed.  Bonnie Salazar 12/27/2015, 3:28 PM

## 2015-12-27 NOTE — Consult Note (Signed)
Chief Complaint: Emergent bleeding  Referring Physician(s): Dr. Judeth Horn  History of Present Illness: Bonnie Salazar is a 75 y.o. female presenting with emergent, life-threatening bleeding, SP left hip arthroplasty, anterior approach after failure of conservative management.     She became hypotensive at the end of the ortho case, and she developed abdominal distension.  Trauma surgery was consulted, and Dr. Hulen Skains recommended CTA.  [facility]IR was consulted during transportation to the CT scanner in anticipation of requirement for embolization, given the patient was requiring volume resuscitation with multiple blood products and was hemodynamically unstable.    The CTA reveals an arterial injury with active extravasation from the left external iliac artery.  Her progressive abdominal distension and tense abdomen are indicative of ongoing hemorrhage, as well as resuscitation for hemorrhagic shock.   I was able to evaluate the patient in the CT scanner and talk to the primary and consult teams at that time.    Past Medical History  Diagnosis Date  . Medical history non-contributory     Past Surgical History  Procedure Laterality Date  . Back surgery      Allergies: Review of patient's allergies indicates no known allergies.  Medications: Prior to Admission medications   Medication Sig Start Date End Date Taking? Authorizing Provider  CALCIUM PO Take 2 tablets by mouth daily.   Yes Historical Provider, MD  chlorhexidine (PERIDEX) 0.12 % solution 15 mLs by Mouth Rinse route 2 (two) times daily. 12/22/15  Yes Historical Provider, MD  Cholecalciferol (VITAMIN D PO) Take 1 capsule by mouth daily.   Yes Historical Provider, MD  fluticasone (FLONASE) 50 MCG/ACT nasal spray Place 2 sprays into the nose daily as needed for allergies.  11/01/15  Yes Historical Provider, MD  GINKGO BILOBA PO Take 1 capsule by mouth daily.   Yes Historical Provider, MD  Misc Natural Products (GLUCOSAMINE  CHONDROITIN TRIPLE) TABS Take 2 tablets by mouth daily.   Yes Historical Provider, MD  Multiple Vitamin (MULTIVITAMIN WITH MINERALS) TABS tablet Take 1 tablet by mouth daily.   Yes Historical Provider, MD  Multiple Vitamins-Minerals (ICAPS AREDS 2) CAPS Take 1 capsule by mouth 2 (two) times daily.   Yes Historical Provider, MD  naproxen sodium (ANAPROX) 220 MG tablet Take 220 mg by mouth at bedtime as needed (pain).   Yes Historical Provider, MD  Omega-3 Fatty Acids (OMEGA 3 PO) Take 1 capsule by mouth daily.   Yes Historical Provider, MD  aspirin EC 325 MG tablet Take 1 tablet (325 mg total) by mouth daily. 12/26/15   Geradine Girt, DO  omeprazole (PRILOSEC) 20 MG capsule Take 1 capsule (20 mg total) by mouth daily. 12/26/15   Geradine Girt, DO  ondansetron (ZOFRAN) 4 MG tablet Take 1 tablet (4 mg total) by mouth every 8 (eight) hours as needed for nausea or vomiting. 12/26/15   Geradine Girt, DO  oxyCODONE-acetaminophen (ROXICET) 5-325 MG tablet Take 1-2 tablets by mouth every 4 (four) hours as needed for severe pain. 12/26/15   Geradine Girt, DO     Family History  Problem Relation Age of Onset  . Hodgkin's lymphoma Mother   . Stroke Father     Social History   Social History  . Marital Status: Married    Spouse Name: N/A  . Number of Children: N/A  . Years of Education: N/A   Social History Main Topics  . Smoking status: Former Research scientist (life sciences)  . Smokeless tobacco: Former Systems developer  Quit date: 12/11/1995  . Alcohol Use: No  . Drug Use: No  . Sexual Activity: Not Asked   Other Topics Concern  . None   Social History Narrative    Review of Systems: A 12 point ROS discussed and pertinent positives are indicated in the HPI above.  All other systems are negative.  Review of Systems  Vital Signs: BP 134/119 mmHg  Pulse 121  Temp(Src) 98.3 F (36.8 C) (Oral)  Resp 16  Wt 162 lb 11.2 oz (73.8 kg)  SpO2 97%  Physical Exam  Targeted exam Distended, tense abdomen.  Dusky, cool  left lower extremity.  Left PT pulse doppler +,  No AT signal.   Right PT doppler +.  No AT signal.   Mallampati Score:     Imaging: Dg Chest 1 View  12/25/2015  CLINICAL DATA:  Recent fall today with left hip pain, initial encounter EXAM: CHEST 1 VIEW COMPARISON:  None. FINDINGS: Cardiac shadow is within normal limits. The lungs are well aerated bilaterally. Mild interstitial changes are seen without focal infiltrate. No acute bony abnormality is seen. IMPRESSION: Mild interstitial changes likely of a chronic nature. No acute abnormality seen. Electronically Signed   By: Inez Catalina M.D.   On: 12/25/2015 21:17   Dg Lumbar Spine Complete  12/25/2015  CLINICAL DATA:  Fall today with low back pain, initial encounter EXAM: LUMBAR SPINE - COMPLETE 4+ VIEW COMPARISON:  None. FINDINGS: Five lumbar type vertebral bodies are well visualized. A mild scoliosis concave to the right is noted. Reactive endplate changes are noted. No anterolisthesis is seen. No soft tissue abnormality is noted. IMPRESSION: Degenerative change without acute abnormality. Electronically Signed   By: Inez Catalina M.D.   On: 12/25/2015 21:17   Ct Pelvis W Contrast  12/26/2015  CLINICAL DATA:  75 year old with large pelvic hematoma following left hip replacement. Evaluate for pelvic bleeding. EXAM: CT PELVIS WITH CONTRAST TECHNIQUE: Multidetector CT imaging of the pelvis was performed using the standard protocol following the bolus administration of intravenous contrast. CONTRAST:  100 mL Isovue COMPARISON:  None. FINDINGS: Vascular structures: The distal abdominal aorta and common iliac arteries are patent. There is active contrast extravasation in the left hemipelvis originating from the proximal left common femoral artery or distal left external iliac artery. Bleeding is originating near the origin of the left inferior epigastric artery but may be separate from this vessel. The left common femoral artery and the proximal left femoral  arteries are patent. The right iliac arteries and right femoral arteries are patent. The delayed images demonstrate additional contrast extravasation within the large hematoma within the anterior pelvis and lower abdomen. There is fluid tracking into the left abdomen. There is fluid and gas tracking up the left iliacus muscle. Expected gas around the left hip from the recent hip replacement. Fluid in the pelvis. Limited evaluation of pelvic structures due to the large amount of blood. The left hip arthroplasty is located. 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 IMPRESSION: Active bleeding in the pelvis. The bleeding is originating from the proximal left common femoral artery or the distal left external iliac artery. The bleeding is near the origin of the left inferior epigastric artery. Large amount of blood and hematoma formation throughout the abdomen and pelvis. Expected postsurgical changes from left hip arthroplasty. Electronically Signed   By: Markus Daft M.D.   On: 12/26/2015 18:55   Ir Angiogram Extremity Left  12/26/2015  INDICATION: 75 year old female with a history of a  hip arthroplasty and hypotension. EXAM: IR EMBO ART VEN HEMORR LYMPH EXTRAV INC GUIDE ROADMAPPING; IR ULTRASOUND GUIDANCE VASC ACCESS RIGHT; ARTERIOGRAPHY; PELVIC SELECTIVE ARTERIOGRAPHY; LEFT EXTREMITY ARTERIOGRAPHY; ADDITIONAL ARTERIOGRAPHY MEDICATIONS: None ANESTHESIA/SEDATION: Patient was intubated. Anesthesia team managed the endotracheal airway CONTRAST:  50 cc Isovue FLUOROSCOPY TIME:  Fluoroscopy Time: 5 minutes 42 seconds COMPLICATIONS: None PROCEDURE: Informed consent was obtained from the patient following explanation of the procedure, risks, benefits and alternatives. The patient understands, agrees and consents for the procedure. All questions were addressed. A time out was performed prior to the initiation of the procedure. Maximal barrier sterile technique utilized including caps, mask, sterile gowns, sterile  gloves, large sterile drape, hand hygiene, and Betadine prep. Patient positioned supine position on the fluoroscopy table. The right inguinal region was prepped and draped in the usual sterile fashion. Ultrasound survey of the right inguinal region was performed with images stored and sent to PACs. A micropuncture needle was used access the right common femoral artery under ultrasound. With excellent arterial blood flow returned, and an .018 micro wire was passed through the needle, observed enter the abdominal aorta under fluoroscopy. The needle was removed, and a micropuncture sheath was placed over the wire. The inner dilator and wire were removed, and an 035 Bentson wire was advanced under fluoroscopy into the abdominal aorta. The sheath was removed and a standard 5 [place] vascular sheath was placed. The dilator was removed and the sheath was flushed. Omni Flush catheter was used to navigate a Bentson wire over the aortic bifurcation into the left iliac system. Catheter was exchanged for a C2 catheter advanced into the hypogastric artery with an angiogram performed. Catheter was withdrawn and redirected into the external iliac artery. Angiogram was performed. A Rosen wire was advanced into the proximal femoral system and the catheter was withdrawn. Six French right tip sheath was advanced over the bifurcation into the left iliac system. 7 mm x 4 cm balloon was advanced over the Rosen wire into the proximal left external iliac artery. Balloon was gently inflated to low atmospheric pressure, with inflation of approximately 4 minutes. At this time, measurements of the left external iliac artery were performed. Balloon was deflated, the balloon catheter was advanced into the proximal left femoral system, and the 035 Rosen wire was exchanged for an 018 S-V8 wire. Balloon catheter was removed. A 46mm x 36mm Viabahn stent graft was select thinned and advanced over the 018 wire to the targeted the avulsed artery. Stent  graft was deployed. Balloon angioplasty to the nominal diameter of 6 mm was performed. Final angiogram was performed. The 6 French 35 cm right tip sheath was exchanged for a standard 10 cm 6 French sheath. Sheath was elected to remain given the patient's coagulopathy. Patient tolerated the procedure well with no significant blood loss. IMPRESSION: Status post left lower extremity angiogram with emergent covered stent embolization of distal external iliac artery branch vessel pseudoaneurysm/avulsion, with placement of stent graft. Signed, Dulcy Fanny. Earleen Newport, DO [facility] Radiology Electronically Signed   By: Corrie Mckusick D.O.   On: 12/26/2015 22:45   Ir Angiogram Pelvis Selective Or Supraselective  12/26/2015  INDICATION: 75 year old female with a history of a hip arthroplasty and hypotension. EXAM: IR EMBO ART VEN HEMORR LYMPH EXTRAV INC GUIDE ROADMAPPING; IR ULTRASOUND GUIDANCE VASC ACCESS RIGHT; ARTERIOGRAPHY; PELVIC SELECTIVE ARTERIOGRAPHY; LEFT EXTREMITY ARTERIOGRAPHY; ADDITIONAL ARTERIOGRAPHY MEDICATIONS: None ANESTHESIA/SEDATION: Patient was intubated. Anesthesia team managed the endotracheal airway CONTRAST:  50 cc Isovue FLUOROSCOPY TIME:  Fluoroscopy Time: 5 minutes 42 seconds COMPLICATIONS: None PROCEDURE: Informed consent was obtained from the patient following explanation of the procedure, risks, benefits and alternatives. The patient understands, agrees and consents for the procedure. All questions were addressed. A time out was performed prior to the initiation of the procedure. Maximal barrier sterile technique utilized including caps, mask, sterile gowns, sterile gloves, large sterile drape, hand hygiene, and Betadine prep. Patient positioned supine position on the fluoroscopy table. The right inguinal region was prepped and draped in the usual sterile fashion. Ultrasound survey of the right inguinal region was performed with images stored and sent  to PACs. A micropuncture needle was used access the right common femoral artery under ultrasound. With excellent arterial blood flow returned, and an .018 micro wire was passed through the needle, observed enter the abdominal aorta under fluoroscopy. The needle was removed, and a micropuncture sheath was placed over the wire. The inner dilator and wire were removed, and an 035 Bentson wire was advanced under fluoroscopy into the abdominal aorta. The sheath was removed and a standard 5 [place] vascular sheath was placed. The dilator was removed and the sheath was flushed. Omni Flush catheter was used to navigate a Bentson wire over the aortic bifurcation into the left iliac system. Catheter was exchanged for a C2 catheter advanced into the hypogastric artery with an angiogram performed. Catheter was withdrawn and redirected into the external iliac artery. Angiogram was performed. A Rosen wire was advanced into the proximal femoral system and the catheter was withdrawn. Six French right tip sheath was advanced over the bifurcation into the left iliac system. 7 mm x 4 cm balloon was advanced over the Rosen wire into the proximal left external iliac artery. Balloon was gently inflated to low atmospheric pressure, with inflation of approximately 4 minutes. At this time, measurements of the left external iliac artery were performed. Balloon was deflated, the balloon catheter was advanced into the proximal left femoral system, and the 035 Rosen wire was exchanged for an 018 S-V8 wire. Balloon catheter was removed. A 73mm x 38mm Viabahn stent graft was select thinned and advanced over the 018 wire to the targeted the avulsed artery. Stent graft was deployed. Balloon angioplasty to the nominal diameter of 6 mm was performed. Final angiogram was performed. The 6 French 35 cm right tip sheath was exchanged for a standard 10 cm 6 French sheath. Sheath was elected to remain given the patient's coagulopathy. Patient tolerated the  procedure well with no significant blood loss. IMPRESSION: Status post left lower extremity angiogram with emergent covered stent embolization of distal external iliac artery branch vessel pseudoaneurysm/avulsion, with placement of stent graft. Signed, Dulcy Fanny. Earleen Newport, DO [facility] Radiology Electronically Signed   By: Corrie Mckusick D.O.   On: 12/26/2015 22:45   Ir Angiogram Selective Each Additional Vessel  12/26/2015  INDICATION: 75 year old female with a history of a hip arthroplasty and hypotension. EXAM: IR EMBO ART VEN HEMORR LYMPH EXTRAV INC GUIDE ROADMAPPING; IR ULTRASOUND GUIDANCE VASC ACCESS RIGHT; ARTERIOGRAPHY; PELVIC SELECTIVE ARTERIOGRAPHY; LEFT EXTREMITY ARTERIOGRAPHY; ADDITIONAL ARTERIOGRAPHY MEDICATIONS: None ANESTHESIA/SEDATION: Patient was intubated. Anesthesia team managed the endotracheal airway CONTRAST:  50 cc Isovue FLUOROSCOPY TIME:  Fluoroscopy Time: 5 minutes 42 seconds COMPLICATIONS: None PROCEDURE: Informed consent was obtained from the patient following explanation of the procedure, risks, benefits and alternatives. The patient understands, agrees and consents for the procedure. All questions were addressed. A time out was performed prior to the initiation of the  procedure. Maximal barrier sterile technique utilized including caps, mask, sterile gowns, sterile gloves, large sterile drape, hand hygiene, and Betadine prep. Patient positioned supine position on the fluoroscopy table. The right inguinal region was prepped and draped in the usual sterile fashion. Ultrasound survey of the right inguinal region was performed with images stored and sent to PACs. A micropuncture needle was used access the right common femoral artery under ultrasound. With excellent arterial blood flow returned, and an .018 micro wire was passed through the needle, observed enter the abdominal aorta under fluoroscopy. The needle was removed, and a  micropuncture sheath was placed over the wire. The inner dilator and wire were removed, and an 035 Bentson wire was advanced under fluoroscopy into the abdominal aorta. The sheath was removed and a standard 5 [place] vascular sheath was placed. The dilator was removed and the sheath was flushed. Omni Flush catheter was used to navigate a Bentson wire over the aortic bifurcation into the left iliac system. Catheter was exchanged for a C2 catheter advanced into the hypogastric artery with an angiogram performed. Catheter was withdrawn and redirected into the external iliac artery. Angiogram was performed. A Rosen wire was advanced into the proximal femoral system and the catheter was withdrawn. Six French right tip sheath was advanced over the bifurcation into the left iliac system. 7 mm x 4 cm balloon was advanced over the Rosen wire into the proximal left external iliac artery. Balloon was gently inflated to low atmospheric pressure, with inflation of approximately 4 minutes. At this time, measurements of the left external iliac artery were performed. Balloon was deflated, the balloon catheter was advanced into the proximal left femoral system, and the 035 Rosen wire was exchanged for an 018 S-V8 wire. Balloon catheter was removed. A 10mm x 63mm Viabahn stent graft was select thinned and advanced over the 018 wire to the targeted the avulsed artery. Stent graft was deployed. Balloon angioplasty to the nominal diameter of 6 mm was performed. Final angiogram was performed. The 6 French 35 cm right tip sheath was exchanged for a standard 10 cm 6 French sheath. Sheath was elected to remain given the patient's coagulopathy. Patient tolerated the procedure well with no significant blood loss. IMPRESSION: Status post left lower extremity angiogram with emergent covered stent embolization of distal external iliac artery branch vessel pseudoaneurysm/avulsion, with placement of stent graft. Signed, Dulcy Fanny. Earleen Newport, DO [facility] Radiology Electronically Signed   By: Corrie Mckusick D.O.   On: 12/26/2015 22:45   Ir Angiogram Follow Up Study  12/26/2015  INDICATION: 75 year old female with a history of a hip arthroplasty and hypotension. EXAM: IR EMBO ART VEN HEMORR LYMPH EXTRAV INC GUIDE ROADMAPPING; IR ULTRASOUND GUIDANCE VASC ACCESS RIGHT; ARTERIOGRAPHY; PELVIC SELECTIVE ARTERIOGRAPHY; LEFT EXTREMITY ARTERIOGRAPHY; ADDITIONAL ARTERIOGRAPHY MEDICATIONS: None ANESTHESIA/SEDATION: Patient was intubated. Anesthesia team managed the endotracheal airway CONTRAST:  50 cc Isovue FLUOROSCOPY TIME:  Fluoroscopy Time: 5 minutes 42 seconds COMPLICATIONS: None PROCEDURE: Informed consent was obtained from the patient following explanation of the procedure, risks, benefits and alternatives. The patient understands, agrees and consents for the procedure. All questions were addressed. A time out was performed prior to the initiation of the procedure. Maximal barrier sterile technique utilized including caps, mask, sterile gowns, sterile gloves, large sterile drape, hand hygiene, and Betadine prep. Patient positioned supine position on the fluoroscopy table. The right inguinal region was prepped and draped in the usual sterile fashion. Ultrasound survey of the right inguinal region was  performed with images stored and sent to PACs. A micropuncture needle was used access the right common femoral artery under ultrasound. With excellent arterial blood flow returned, and an .018 micro wire was passed through the needle, observed enter the abdominal aorta under fluoroscopy. The needle was removed, and a micropuncture sheath was placed over the wire. The inner dilator and wire were removed, and an 035 Bentson wire was advanced under fluoroscopy into the abdominal aorta. The sheath was removed and a standard 5 [place] vascular sheath was placed. The dilator was removed and the sheath was flushed. Omni Flush  catheter was used to navigate a Bentson wire over the aortic bifurcation into the left iliac system. Catheter was exchanged for a C2 catheter advanced into the hypogastric artery with an angiogram performed. Catheter was withdrawn and redirected into the external iliac artery. Angiogram was performed. A Rosen wire was advanced into the proximal femoral system and the catheter was withdrawn. Six French right tip sheath was advanced over the bifurcation into the left iliac system. 7 mm x 4 cm balloon was advanced over the Rosen wire into the proximal left external iliac artery. Balloon was gently inflated to low atmospheric pressure, with inflation of approximately 4 minutes. At this time, measurements of the left external iliac artery were performed. Balloon was deflated, the balloon catheter was advanced into the proximal left femoral system, and the 035 Rosen wire was exchanged for an 018 S-V8 wire. Balloon catheter was removed. A 28mm x 29mm Viabahn stent graft was select thinned and advanced over the 018 wire to the targeted the avulsed artery. Stent graft was deployed. Balloon angioplasty to the nominal diameter of 6 mm was performed. Final angiogram was performed. The 6 French 35 cm right tip sheath was exchanged for a standard 10 cm 6 French sheath. Sheath was elected to remain given the patient's coagulopathy. Patient tolerated the procedure well with no significant blood loss. IMPRESSION: Status post left lower extremity angiogram with emergent covered stent embolization of distal external iliac artery branch vessel pseudoaneurysm/avulsion, with placement of stent graft. Signed, Dulcy Fanny. Earleen Newport, DO [facility] Radiology Electronically Signed   By: Corrie Mckusick D.O.   On: 12/26/2015 22:45   Ir US Guide Vasc Access Right  12/26/2015  INDICATION: 75 year old female with a history of a hip arthroplasty and hypotension. EXAM: IR EMBO ART VEN HEMORR LYMPH EXTRAV  INC GUIDE ROADMAPPING; IR ULTRASOUND GUIDANCE VASC ACCESS RIGHT; ARTERIOGRAPHY; PELVIC SELECTIVE ARTERIOGRAPHY; LEFT EXTREMITY ARTERIOGRAPHY; ADDITIONAL ARTERIOGRAPHY MEDICATIONS: None ANESTHESIA/SEDATION: Patient was intubated. Anesthesia team managed the endotracheal airway CONTRAST:  50 cc Isovue FLUOROSCOPY TIME:  Fluoroscopy Time: 5 minutes 42 seconds COMPLICATIONS: None PROCEDURE: Informed consent was obtained from the patient following explanation of the procedure, risks, benefits and alternatives. The patient understands, agrees and consents for the procedure. All questions were addressed. A time out was performed prior to the initiation of the procedure. Maximal barrier sterile technique utilized including caps, mask, sterile gowns, sterile gloves, large sterile drape, hand hygiene, and Betadine prep. Patient positioned supine position on the fluoroscopy table. The right inguinal region was prepped and draped in the usual sterile fashion. Ultrasound survey of the right inguinal region was performed with images stored and sent to PACs. A micropuncture needle was used access the right common femoral artery under ultrasound. With excellent arterial blood flow returned, and an .018 micro wire was passed through the needle, observed enter the abdominal aorta under fluoroscopy. The needle was removed, and  a micropuncture sheath was placed over the wire. The inner dilator and wire were removed, and an 035 Bentson wire was advanced under fluoroscopy into the abdominal aorta. The sheath was removed and a standard 5 [place] vascular sheath was placed. The dilator was removed and the sheath was flushed. Omni Flush catheter was used to navigate a Bentson wire over the aortic bifurcation into the left iliac system. Catheter was exchanged for a C2 catheter advanced into the hypogastric artery with an angiogram performed. Catheter was withdrawn and redirected into the external iliac artery. Angiogram was performed. A Rosen  wire was advanced into the proximal femoral system and the catheter was withdrawn. Six French right tip sheath was advanced over the bifurcation into the left iliac system. 7 mm x 4 cm balloon was advanced over the Rosen wire into the proximal left external iliac artery. Balloon was gently inflated to low atmospheric pressure, with inflation of approximately 4 minutes. At this time, measurements of the left external iliac artery were performed. Balloon was deflated, the balloon catheter was advanced into the proximal left femoral system, and the 035 Rosen wire was exchanged for an 018 S-V8 wire. Balloon catheter was removed. A 71mm x 42mm Viabahn stent graft was select thinned and advanced over the 018 wire to the targeted the avulsed artery. Stent graft was deployed. Balloon angioplasty to the nominal diameter of 6 mm was performed. Final angiogram was performed. The 6 French 35 cm right tip sheath was exchanged for a standard 10 cm 6 French sheath. Sheath was elected to remain given the patient's coagulopathy. Patient tolerated the procedure well with no significant blood loss. IMPRESSION: Status post left lower extremity angiogram with emergent covered stent embolization of distal external iliac artery branch vessel pseudoaneurysm/avulsion, with placement of stent graft. Signed, Dulcy Fanny. Earleen Newport, DO [facility] Radiology Electronically Signed   By: Corrie Mckusick D.O.   On: 12/26/2015 22:45   Dg Chest Port 1 View  12/26/2015  CLINICAL DATA:  Patient status post ET tube placement. EXAM: PORTABLE CHEST 1 VIEW COMPARISON:  Chest radiograph 12/25/2015. FINDINGS: ET tube terminates in the mid trachea. Left subclavian central venous catheter tip projects over the superior vena cava. Enteric tube tip and side port terminates in the midesophagus, recommend advancement. Stable cardiac and mediastinal contours. Low lung volumes. Elevation of the right hemidiaphragm.  Right-greater-than-left interstitial pulmonary opacities. No large pleural effusion or pneumothorax. IMPRESSION: ET tube tip and side port terminate in the midesophagus, recommend advancement. Bilateral, left-greater-than-right, interstitial opacities which may represent pulmonary edema. These results will be called to the ordering clinician or representative by the Radiologist Assistant, and communication documented in the PACS or zVision Dashboard. Electronically Signed   By: Lovey Newcomer M.D.   On: 12/26/2015 21:07   Dg Hip Operative Unilat W Or W/o Pelvis Left  12/26/2015  CLINICAL DATA:  Left anterior hip replacement due to fracture. EXAM: OPERATIVE LEFT HIP (WITH PELVIS IF PERFORMED) 1 VIEW TECHNIQUE: Fluoroscopic spot image(s) were submitted for interpretation post-operatively. COMPARISON:  12/25/2015 FINDINGS: A total left hip arthroplasty has been performed. Alignment of the prosthesis is grossly normal on this single view. No evidence for a periprosthetic fracture. IMPRESSION: Left hip replacement without complicating features. Electronically Signed   By: Markus Daft M.D.   On: 12/26/2015 16:34   Dg Hip Unilat With Pelvis 2-3 Views Left  12/25/2015  CLINICAL DATA:  Recent fall with hip pain, initial encounter EXAM: DG HIP (WITH OR WITHOUT  PELVIS) 2-3V LEFT COMPARISON:  None. FINDINGS: There is left femoral neck fracture with impaction and angulation at the fracture site. Pelvic ring is intact. No other focal abnormality is seen. IMPRESSION: Left femoral neck fracture. Electronically Signed   By: Inez Catalina M.D.   On: 12/25/2015 21:16   Dg Femur 1v Left  12/25/2015  CLINICAL DATA:  [place] with left hip pain. EXAM: LEFT FEMUR 1 VIEW COMPARISON:  Left hip 12/25/2015 FINDINGS: Single view of the left femur was obtained. There is a fracture involving the proximal left femur at the junction of the femoral head and neck. There is superior displacement of the left femoral neck. Findings are suggestive for a  subcapital hip fracture. The mid and distal femur appear to be intact on this single view. IMPRESSION: Fracture of the proximal left femur. Findings are compatible with a subcapital hip fracture. Electronically Signed   By: Markus Daft M.D.   On: 12/25/2015 21:17   [place] Guide Roadmapping  12/26/2015  INDICATION: 75 year old female with a history of a hip arthroplasty and hypotension. EXAM: IR EMBO ART VEN HEMORR LYMPH EXTRAV INC GUIDE ROADMAPPING; IR ULTRASOUND GUIDANCE VASC ACCESS RIGHT; ARTERIOGRAPHY; PELVIC SELECTIVE ARTERIOGRAPHY; LEFT EXTREMITY ARTERIOGRAPHY; ADDITIONAL ARTERIOGRAPHY MEDICATIONS: None ANESTHESIA/SEDATION: Patient was intubated. Anesthesia team managed the endotracheal airway CONTRAST:  50 cc Isovue FLUOROSCOPY TIME:  Fluoroscopy Time: 5 minutes 42 seconds COMPLICATIONS: None PROCEDURE: Informed consent was obtained from the patient following explanation of the procedure, risks, benefits and alternatives. The patient understands, agrees and consents for the procedure. All questions were addressed. A time out was performed prior to the initiation of the procedure. Maximal barrier sterile technique utilized including caps, mask, sterile gowns, sterile gloves, large sterile drape, hand hygiene, and Betadine prep. Patient positioned supine position on the fluoroscopy table. The right inguinal region was prepped and draped in the usual sterile fashion. Ultrasound survey of the right inguinal region was performed with images stored and sent to PACs. A micropuncture needle was used access the right common femoral artery under ultrasound. With excellent arterial blood flow returned, and an .018 micro wire was passed through the needle, observed enter the abdominal aorta under fluoroscopy. The needle was removed, and a micropuncture sheath was placed over the wire. The inner dilator and wire were removed, and an 035 Bentson wire was advanced under fluoroscopy into  the abdominal aorta. The sheath was removed and a standard 5 [place] vascular sheath was placed. The dilator was removed and the sheath was flushed. Omni Flush catheter was used to navigate a Bentson wire over the aortic bifurcation into the left iliac system. Catheter was exchanged for a C2 catheter advanced into the hypogastric artery with an angiogram performed. Catheter was withdrawn and redirected into the external iliac artery. Angiogram was performed. A Rosen wire was advanced into the proximal femoral system and the catheter was withdrawn. Six French right tip sheath was advanced over the bifurcation into the left iliac system. 7 mm x 4 cm balloon was advanced over the Rosen wire into the proximal left external iliac artery. Balloon was gently inflated to low atmospheric pressure, with inflation of approximately 4 minutes. At this time, measurements of the left external iliac artery were performed. Balloon was deflated, the balloon catheter was advanced into the proximal left femoral system, and the 035 Rosen wire was exchanged for an 018 S-V8 wire. Balloon catheter was removed. A 72mm x 72mm Viabahn stent graft was select  thinned and advanced over the 018 wire to the targeted the avulsed artery. Stent graft was deployed. Balloon angioplasty to the nominal diameter of 6 mm was performed. Final angiogram was performed. The 6 French 35 cm right tip sheath was exchanged for a standard 10 cm 6 French sheath. Sheath was elected to remain given the patient's coagulopathy. Patient tolerated the procedure well with no significant blood loss. IMPRESSION: Status post left lower extremity angiogram with emergent covered stent embolization of distal external iliac artery branch vessel pseudoaneurysm/avulsion, with placement of stent graft. Signed, Dulcy Fanny. Earleen Newport, DO [facility] Radiology Electronically Signed   By: Corrie Mckusick D.O.   On: 12/26/2015 22:45     Labs:  CBC:  Recent Labs  12/25/15 2013 12/26/15 0444  12/26/15 1740 12/26/15 1855 12/27/15 12/27/15 0400  WBC 12.6* 9.7  --  20.3*  --  8.3  --   HGB 12.0 10.7*  < > 10.7* 11.2* 11.6* 11.1*  HCT 35.3* 32.8*  < > 31.6* 33.0* 34.3* 32.3*  PLT 246 218  --  87*  --  75*  --   < > = values in this interval not displayed.  COAGS:  Recent Labs  12/25/15 2013 12/26/15 1740 12/27/15 12/27/15 0400  INR 1.13 2.01* 1.35 1.37  APTT  --  29  --   --     BMP:  Recent Labs  12/25/15 2013 12/26/15 0444  12/26/15 1720 12/26/15 1855 12/27/15 12/27/15 0400  NA 132* 134*  < > 139 140 138 140  K 4.3 4.1  < > 3.9 5.1 3.6 3.6  CL 101 103  --   --   --  108 110  CO2 23 25  --   --   --  22 22  GLUCOSE 143* 128*  --   --   --  196* 145*  BUN 21* 15  --   --   --  12 14  CALCIUM 9.2 8.9  --   --   --  8.9 8.4*  CREATININE 0.76 0.77  --   --   --  0.75 0.78  GFRNONAA >60 >60  --   --   --  >60 >60  GFRAA >60 >60  --   --   --  >60 >60  < > = values in this interval not displayed.  LIVER FUNCTION TESTS:  Recent Labs  12/27/15 12/27/15 0400  BILITOT 1.2 1.1  AST 51* 45*  ALT 28 25  ALKPHOS 66 59  PROT 6.0* 5.6*  ALBUMIN 3.5 3.1*    TUMOR MARKERS: No results for input(s): AFPTM, CEA, CA199, CHROMGRNA in the last 8760 hours.  Assessment and Plan:  Bonnie Salazar is a 75 year old female with life-threatening hemorrhage, SP left hip arthroplasty for treatment of femoral fracture.   CT is showing the source from left external iliac artery, possibly inferior epigastric artery, possibly anastomosis b/w external and obturator artery (so-called "corona mortis").    Have discussed with Dr. Percell Miller and Dr. Hulen Skains.  Best option I agree is angiogram for treatment.  We will proceed emergently.    Have also discussed with the patient's husband. Risks and Benefits discussed regarding this emergent situation, including, but not limited to bleeding, infection, vascular injury or contrast  induced renal failure, need for further procedure/surgery, limb loss, disability, prolonged ICU stay, cardiopulmonary collapse, death.  All of the husband's questions were answered, patient is agreeable to proceed. Consent signed and in  chart.   Thank you for this interesting consult.  I greatly enjoyed meeting Bonnie Salazar and look forward to participating in their care.  A copy of this report was sent to the requesting provider on this date.  Electronically Signed: Corrie Mckusick 12/27/2015, 8:12 AM   I spent a total of 20 Minutes    in face to face in clinical consultation, greater than 50% of which was counseling/coordinating care for life-threatening post-operative hemorrhage, left lower extremity arterial injury, emergent angiogram with possible intervention.    Modifier may be used because of emergent, same-time decision to proceed with procedure/intervention.

## 2015-12-27 NOTE — Consult Note (Addendum)
CARDIOLOGY CONSULT NOTE   Patient ID: Bonnie Salazar MRN: [id number] DOB/AGE: 1940-07-30 75 y.o.  Admit date: 12/25/2015  Primary Physician   Bonnie Salazar, [facility] in [place] Reason for Consultation   Atrial fib, abnl echo Requesting MD: Bonnie Griffon, NP   [id number] [name] is a 75 y.o. year old female with a history of a bad back due to disc problems, no hx DM, HTN, HLD, arrhythmia. She quit tobacco > 20 years ago. Generally as active as her back will allow.She was out walking and fell and fractured her hip.  Had a mechanical fall 06/15 and was found to have a femoral L femoral neck fx. Pt had surgical repair but the femoral artery was nicked, requiring Interventional Radiology to repair with a covered stent.. She had anemia from these procedures, with H&H as low as 7.8/23, s/p transfusion.  She was extubated and feels relatively well now.  Overnight, she had some rapid atrial fibrillation, spontaneously converting to SR. Currently in SR. She had palpitations with the atrial fibrillation, but has never had them before. She has no history of presyncope or syncope.  No prior history of a cardiac murmur.  Because of the atrial fibrillation echocardiogram was obtained with the unexpected findings of an atrial mass.  An echocardiogram was abnormal with a possible atrial mass, images reviewed. Pt has never had a stroke or TIA.   She evidently went 24 hours prior to having the hip repaired.  No history of DVT or LE edema, pain.    Past Medical History  Diagnosis Date  . Medical history non-contributory      Past Surgical History  Procedure Laterality Date  . Back surgery      No Known Allergies  I have reviewed the patient's current medications . aspirin EC  325 mg Oral Daily  . chlorhexidine gluconate (SAGE KIT)  15 mL Mouth Rinse BID  . docusate sodium  100 mg Oral BID  . famotidine (PEPCID) IV  20 mg  Intravenous Q12H  . feeding supplement (ENSURE ENLIVE)  237 mL Oral BID BM  . metoprolol      . metoprolol  2.5 mg Intravenous Q3H  . tranexamic acid (CYKLOKAPRON) topical -INTRAOP  2,000 mg Topical STAT   . sodium chloride 75 mL/hr at 12/26/15 2219  . heparin     acetaminophen, fentaNYL (SUBLIMAZE) injection, [DISCONTINUED] metoCLOPramide **OR** metoCLOPramide (REGLAN) injection, ondansetron **OR** ondansetron (ZOFRAN) IV  Prior to Admission medications   Medication Sig Start Date End Date Taking? Authorizing Provider  CALCIUM PO Take 2 tablets by mouth daily.   Yes Historical Provider, MD  chlorhexidine (PERIDEX) 0.12 % solution 15 mLs by Mouth Rinse route 2 (two) times daily. 12/22/15  Yes Historical Provider, MD  Cholecalciferol (VITAMIN D PO) Take 1 capsule by mouth daily.   Yes Historical Provider, MD  fluticasone (FLONASE) 50 MCG/ACT nasal spray Place 2 sprays into the nose daily as needed for allergies.  11/01/15  Yes Historical Provider, MD  GINKGO BILOBA PO Take 1 capsule by mouth daily.   Yes Historical Provider, MD  Misc Natural Products (GLUCOSAMINE CHONDROITIN TRIPLE) TABS Take 2 tablets by mouth daily.   Yes Historical Provider, MD  Multiple Vitamin (MULTIVITAMIN WITH MINERALS) TABS tablet Take 1 tablet by mouth daily.   Yes Historical Provider, MD  Multiple Vitamins-Minerals (ICAPS AREDS 2) CAPS Take 1 capsule by mouth 2 (two) times daily.   Yes Historical Provider, MD  naproxen sodium (ANAPROX) 220 MG tablet Take 220 mg by mouth at bedtime as needed (pain).   Yes Historical Provider, MD  Omega-3 Fatty Acids (OMEGA 3 PO) Take 1 capsule by mouth daily.   Yes Historical Provider, MD  aspirin EC 325 MG tablet Take 1 tablet (325 mg total) by mouth daily. 12/26/15   Bonnie Girt, DO  omeprazole (PRILOSEC) 20 MG capsule Take 1 capsule (20 mg total) by mouth daily. 12/26/15   Bonnie Girt, DO  ondansetron (ZOFRAN) 4 MG tablet Take 1 tablet (4 mg total) by mouth every 8 (eight) hours  as needed for nausea or vomiting. 12/26/15   Bonnie Girt, DO  oxyCODONE-acetaminophen (ROXICET) 5-325 MG tablet Take 1-2 tablets by mouth every 4 (four) hours as needed for severe pain. 12/26/15   Bonnie Girt, DO     Social History   Social History  . Marital Status: Married    Spouse Name: N/A  . Number of Children: N/A  . Years of Education: N/A   Occupational History  . Retired Estate manager/land agent    Social History Main Topics  . Smoking status: Former Research scientist (life sciences)  . Smokeless tobacco: Former Systems developer    Quit date: 12/11/1995  . Alcohol Use: No  . Drug Use: No  . Sexual Activity: Not on file   Other Topics Concern  . Not on file   Social History Narrative   Pt lives with her husband, walks daily.    Family Status  Relation Status Death Age  . Mother Deceased 26  . Father Deceased   . Sister Alive    Family History  Problem Relation Age of Onset  . Hodgkin's lymphoma Mother   . Stroke Father   . Coronary artery disease Father     in his late 51s     ROS:  Full 14 point review of systems complete and found to be negative unless listed above.  Physical Exam: Blood pressure 142/90, pulse 107, temperature 98.3 F (36.8 C), temperature source Oral, resp. rate 23, height '5\' 7"'$  (1.702 m), weight 162 lb 11.2 oz (73.8 kg), SpO2 94 %.  General: Well developed, well nourished, female in no acute distress Head: Eyes PERRLA, No xanthomas.   Normocephalic and atraumatic, oropharynx without edema or exudate. Dentition: fair Lungs: Clear bilaterally Heart: HRRR S1 S2, no rub/gallop, 2/6 murmur. pulses are 2+ all 4 extrem.   Neck: No carotid bruits. No lymphadenopathy.  JVD elevated to jaw Abdomen: Bowel sounds present, abdomen soft and non-tender without masses or hernias noted. Msk:  No joint effusions. Extremities: No clubbing or cyanosis. No edema. L hip incisions dressed, not disturbed Neuro: Alert and oriented X 3. No focal deficits noted. Psych:  Good affect, responds  appropriately Skin: No rashes or lesions noted.  Labs:   Lab Results  Component Value Date   WBC 7.2 12/27/2015   HGB 10.7* 12/27/2015   HCT 30.7* 12/27/2015   MCV 84.1 12/27/2015   PLT 61* 12/27/2015    Recent Labs  12/27/15 0400  INR 1.37     Recent Labs Lab 12/27/15 0400  NA 140  K 3.6  CL 110  CO2 22  BUN 14  CREATININE 0.78  CALCIUM 8.4*  PROT 5.6*  BILITOT 1.1  ALKPHOS 59  ALT 25  AST 45*  GLUCOSE 145*  ALBUMIN 3.1*    Recent Labs  12/26/15 1740 12/27/15 12/27/15 0400 12/27/15 1037  TROPONINI 0.03 0.62* 1.20* 2.00*    Lab  Results  Component Value Date   TRIG 81 12/27/2015   Echo: Large irregular right atrial mass appears to be free floating, EF 50-55%, moderate to severe eccentric mitral regurgitation  ECG:  06/17 Atrial fib>>>SR No acute ischemic changes  Radiology:   IMPRESSION: Active bleeding in the pelvis. The bleeding is originating from the proximal left common femoral artery or the distal left external iliac artery. The bleeding is near the origin of the left inferior epigastric artery. Large amount of blood and hematoma formation throughout the abdomen and pelvis. Expected postsurgical changes from left hip arthroplasty. Electronically Signed   By: Markus Daft M.D.   On: 12/26/2015 18:55   Ir Angiogram Extremity Left 12/26/2015  INDICATION: 75 year old female with a history of a hip arthroplasty and hypotension. EXAM: IR EMBO ART VEN HEMORR LYMPH EXTRAV INC GUIDE ROADMAPPING; IR ULTRASOUND GUIDANCE VASC ACCESS RIGHT; ARTERIOGRAPHY; PELVIC SELECTIVE ARTERIOGRAPHY; LEFT EXTREMITY ARTERIOGRAPHY; ADDITIONAL ARTERIOGRAPHY MEDICATIONS: None ANESTHESIA/SEDATION: Patient was intubated. Anesthesia team managed the endotracheal airway CONTRAST:  50 cc Isovue FLUOROSCOPY TIME:  Fluoroscopy Time: 5 minutes 42 seconds COMPLICATIONS: None PROCEDURE: Informed consent was obtained from the patient following explanation of the procedure, risks, benefits and  alternatives. The patient understands, agrees and consents for the procedure. All questions were addressed. A time out was performed prior to the initiation of the procedure. Maximal barrier sterile technique utilized including caps, mask, sterile gowns, sterile gloves, large sterile drape, hand hygiene, and Betadine prep. Patient positioned supine position on the fluoroscopy table. The right inguinal region was prepped and draped in the usual sterile fashion. Ultrasound survey of the right inguinal region was performed with images stored and sent to PACs. A micropuncture needle was used access the right common femoral artery under ultrasound. With excellent arterial blood flow returned, and an .018 micro wire was passed through the needle, observed enter the abdominal aorta under fluoroscopy. The needle was removed, and a micropuncture sheath was placed over the wire. The inner dilator and wire were removed, and an 035 Bentson wire was advanced under fluoroscopy into the abdominal aorta. The sheath was removed and a standard 5 [place] vascular sheath was placed. The dilator was removed and the sheath was flushed. Omni Flush catheter was used to navigate a Bentson wire over the aortic bifurcation into the left iliac system. Catheter was exchanged for a C2 catheter advanced into the hypogastric artery with an angiogram performed. Catheter was withdrawn and redirected into the external iliac artery. Angiogram was performed. A Rosen wire was advanced into the proximal femoral system and the catheter was withdrawn. Six French right tip sheath was advanced over the bifurcation into the left iliac system. 7 mm x 4 cm balloon was advanced over the Rosen wire into the proximal left external iliac artery. Balloon was gently inflated to low atmospheric pressure, with inflation of approximately 4 minutes. At this time, measurements of the left external iliac artery were performed. Balloon was deflated, the balloon catheter was  advanced into the proximal left femoral system, and the 035 Rosen wire was exchanged for an 018 S-V8 wire. Balloon catheter was removed. A 73m x 542mViabahn stent graft was select thinned and advanced over the 018 wire to the targeted the avulsed artery. Stent graft was deployed. Balloon angioplasty to the nominal diameter of 6 mm was performed. Final angiogram was performed. The 6 French 35 cm right tip sheath was exchanged for a standard 10 cm 6 French sheath. Sheath was elected to remain given the patient's  coagulopathy. Patient tolerated the procedure well with no significant blood loss. IMPRESSION: Status post left lower extremity angiogram with emergent covered stent embolization of distal external iliac artery branch vessel pseudoaneurysm/avulsion, with placement of stent graft. Signed, Dulcy Fanny. Earleen Newport, DO [facility] Radiology Electronically Signed   By: Corrie Mckusick D.O.   On: 12/26/2015 22:45    ASSESSMENT AND PLAN:     Principal Problem:   Fracture of femoral neck, left (Accident) - S/p surgical repair  1.  Paroxysmal atrial fibrillation - paroxysmal during this admission - not many symptoms from the arrhythmia - no known previous history - currently back in SR - CHADS2VASC=2 (female and age) - Ortho and IR have cleared pt for anticoag -Would need to weigh the benefits versus the risks of anticoagulation in light of the recent surgery and life-threatening bleed that she had.  2.  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  She has had some episodic atrial fibrillation.  She is not currently short of breath.  3. 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  Likely unrelated to the atrial clot.  In terms of the atrial mass would recommend that she have a CT angiogram to be sure that she  does not have pulmonary emboli and also to try to further characterize the atrial mass.  Cardiac surgery consultation.  Obtain lower extremity Dopplers to evaluate for lower extremity clot.  It is possible also that if she has some compromise of her iliac vessels that  could've led to a thrombus although the size of the mass is a little unusual.  Recommendations:  1.  Obtain CT angiogram 2.  Obtain lower extremity Dopplers 3.  Cardiac surgery consultation to determine what is best to do about the atrial mass 4.  Discussed again with critical care medicine 5.  Observe atrial arrhythmias at the present time may use diltiazem or beta blockers for control 6.  Weigh the benefits versus the risks of anticoagulation in this patient.  Pt seen and examined in detail  Note added to   W. Doristine Church. MD [facility] 12/27/2015  5:46 PM

## 2015-12-27 NOTE — Progress Notes (Signed)
PT Cancellation Note  Patient Details Name: Bonnie Salazar MRN: [id number] DOB: [birth year]   Cancelled Treatment:    Reason Eval/Treat Not Completed: Other (comment). Remains on bedrest x 6 hours post-procedure. Will follow-up late today or 6/18.   [name] 12/27/2015, 2:11 PM Pager [phone]

## 2015-12-27 NOTE — Progress Notes (Signed)
ANTICOAGULATION CONSULT NOTE - Initial Consult  Pharmacy Consult for Heparin Indication: atrial fibrillation  No Known Allergies  Patient Measurements: Height: _0  (170.2 cm) Weight: 162 lb 11.2 oz (73.8 kg) IBW/kg (Calculated) : 61.6 Heparin Dosing Weight: 74 kg  Vital Signs: Temp: 98.3 F (36.8 C) (06/17 1135) Temp Source: Oral (06/17 1135) BP: 142/90 mmHg (06/17 1500) Pulse Rate: 107 (06/17 1500)  Labs:  Recent Labs  12/26/15 0444  12/26/15 1740  12/27/15 12/27/15 0400 12/27/15 0743 12/27/15 1037  HGB 10.7*  < > 10.7*  < > 11.6* 11.1* 10.7*  --   HCT 32.8*  < > 31.6*  < > 34.3* 32.3* 30.7*  --   PLT 218  --  87*  --  75*  --  61*  --   APTT  --   --  29  --   --   --   --   --   LABPROT  --   --  22.6*  --  16.8* 17.0*  --   --   INR  --   --  2.01*  --  1.35 1.37  --   --   CREATININE 0.77  --   --   --  0.75 0.78  --   --   TROPONINI  --   < > 0.03  --  0.62* 1.20*  --  2.00*  < > = values in this interval not displayed.  CrCl cannot be calculated (Unknown ideal weight.).   Medical History: Past Medical History  Diagnosis Date  . Medical history non-contributory     Medications:  Prescriptions prior to admission  Medication Sig Dispense Refill Last Dose  . CALCIUM PO Take 2 tablets by mouth daily.   12/25/2015 at Unknown time  . chlorhexidine (PERIDEX) 0.12 % solution 15 mLs by Mouth Rinse route 2 (two) times daily.   12/25/2015 at Unknown time  . Cholecalciferol (VITAMIN D PO) Take 1 capsule by mouth daily.   12/25/2015 at Unknown time  . fluticasone (FLONASE) 50 MCG/ACT nasal spray Place 2 sprays into the nose daily as needed for allergies.    Past Month at Unknown time  . GINKGO BILOBA PO Take 1 capsule by mouth daily.   12/25/2015 at Unknown time  . Misc Natural Products (GLUCOSAMINE CHONDROITIN TRIPLE) TABS Take 2 tablets by mouth daily.   12/25/2015 at Unknown time  . Multiple Vitamin (MULTIVITAMIN WITH MINERALS) TABS tablet Take 1 tablet by mouth  daily.   12/25/2015 at Unknown time  . Multiple Vitamins-Minerals (ICAPS AREDS 2) CAPS Take 1 capsule by mouth 2 (two) times daily.   12/25/2015 at Unknown time  . naproxen sodium (ANAPROX) 220 MG tablet Take 220 mg by mouth at bedtime as needed (pain).   12/24/2015 at Unknown time  . Omega-3 Fatty Acids (OMEGA 3 PO) Take 1 capsule by mouth daily.   12/25/2015 at Unknown time   Scheduled:  . aspirin EC  325 mg Oral Daily  . chlorhexidine gluconate (SAGE KIT)  15 mL Mouth Rinse BID  . docusate sodium  100 mg Oral BID  . famotidine (PEPCID) IV  20 mg Intravenous Q12H  . feeding supplement (ENSURE ENLIVE)  237 mL Oral BID BM  . metoprolol      . metoprolol  2.5 mg Intravenous Q3H  . tranexamic acid (CYKLOKAPRON) topical -INTRAOP  2,000 mg Topical STAT   Infusions:  . sodium chloride 75 mL/hr at 12/26/15 2219    Assessment: 75yo female here  for mechanical fall and femoral neck fracture. Pharmacy is consulted to dose heparin for atrial fibrillation.   Goal of Therapy:  Heparin level 0.3-0.7 units/ml Monitor platelets by anticoagulation protocol: Yes   Plan:  Start heparin infusion at 1100 units/hr Check anti-Xa level in 8 hours and daily while on heparin Continue to monitor H&H and platelets  Andrey Cota. Diona Foley, PharmD, [place] Clinical Pharmacist Pager [phone] 12/27/2015,3:41 PM

## 2015-12-27 NOTE — Progress Notes (Signed)
Right 6 french sheath pull, Exoceal deployed and held manual pressure for 10 minutes. Vpad also applied to reduce oozing.  No complication/hematoma. Verified site with Summer RN.  France Ravens RT R, [place]

## 2015-12-27 NOTE — Progress Notes (Signed)
[facility] ADULT ICU REPLACEMENT PROTOCOL FOR AM LAB REPLACEMENT ONLY  The patient does apply for the [facility] Adult ICU Electrolyte Replacment Protocol based on the criteria listed below:   1. Is GFR >/= 40 ml/min? Yes.    Patient's GFR today is >60 2. Is urine output >/= 0.5 ml/kg/hr for the last 6 hours? Yes.   Patient's UOP is 1.2 ml/kg/hr 3. Is BUN < 60 mg/dL? Yes.    Patient's BUN today is 14 4. Abnormal electrolyte(s):K3.6 5. Ordered repletion with: per protocol 6. If a panic level lab has been reported, has the CCM MD in charge been notified? Yes.  .   Physician:  Patricia Nettle, MD  [facility] 12/27/2015 6:36 AM

## 2015-12-27 NOTE — Progress Notes (Signed)
PULMONARY / CRITICAL CARE MEDICINE   Name: HALO CARDINALI MRN: [id number] DOB: 17-May-1941    ADMISSION DATE:  12/25/2015 CONSULTATION DATE:  12/26/15  REFERRING MD:  Trauma service  CHIEF COMPLAINT:  Hip fracture; need for mechanical ventilation post-operatively  HISTORY OF PRESENT ILLNESS:   Ms. Badr is a 75F who presented 12/25/15 after an fall at home with resultant left femoral neck fracture. She was taken to the OR for operative repair 6/16. The initial repair appeared uneventful and she was extubated in the PACU. Shortly thereafter, she complained of abdominal pain and was noted to have a tense abdomen. CT scan showed extravasation into the LLQ; she was taken to IR and underwent covered stent of the distal external iliac artery to the inguinal ligament. Vascular surgery was also consulted. No signs of distal ischemia on his exam. There is some concern for abdominal compartment syndrome, as well. MTP was initiated and she received 11u pRBC, 7FFP, 2 PLT pheresis, 2 cryoprecipitate.    SUBJECTIVE:  Wants tube out. Endorses she is anxious   VITAL SIGNS: BP 134/119 mmHg  Pulse 121  Temp(Src) 98.3 F (36.8 C) (Oral)  Resp 16  Wt 162 lb 11.2 oz (73.8 kg)  SpO2 97%  HEMODYNAMICS:    VENTILATOR SETTINGS: Vent Mode:  [-] PRVC FiO2 (%):  [40 %-80 %] 40 % Set Rate:  [14 bmp] 14 bmp Vt Set:  [500 mL] 500 mL PEEP:  [5 cmH20] 5 cmH20 Plateau Pressure:  [15 cmH20-22 cmH20] 22 cmH20  INTAKE / OUTPUT: I/O last 3 completed shifts: In: 14967.7 [I.V.:8219.7; PN:[id number]; Other:135; IV Piggyback:550] Out: 2251 [Urine:1150; Stool:1; Blood:1100]  PHYSICAL EXAMINATION:  General Well nourished, well developed, anxious wants tube out   HEENT No gross abnormalities. ETT/OGT in place  Pulmonary Clear to auscultation bilaterally with no wheezes, rales or ronchi. Good effort, symmetrical expansion. Excellent Vt on SBT  Cardiovascular Intermittent rapid rate; II/VI diastolic heart murmur.   Distal pulses palpable.  Abdomen Distended, hypoactive bowel sounds.  Musculoskeletal L hip post-surgical changes / dressing in place  Lymphatics No cervical, supraclavicular or axillary adenopathy.   Neurologic Grossly intact. No focal deficits.   Skin/Integuement No rash, no cyanosis, no clubbing. Pale. L [place] CVC in place. LLE edematous     LABS:  BMET  Recent Labs Lab 12/26/15 0444  12/26/15 1855 12/27/15 12/27/15 0400  NA 134*  < > 140 138 140  K 4.1  < > 5.1 3.6 3.6  CL 103  --   --  108 110  CO2 25  --   --  22 22  BUN 15  --   --  12 14  CREATININE 0.77  --   --  0.75 0.78  GLUCOSE 128*  --   --  196* 145*  < > = values in this interval not displayed.  Electrolytes  Recent Labs Lab 12/26/15 0444 12/27/15 12/27/15 0400  CALCIUM 8.9 8.9 8.4*    CBC  Recent Labs Lab 12/26/15 0444  12/26/15 1740 12/26/15 1855 12/27/15 12/27/15 0400  WBC 9.7  --  20.3*  --  8.3  --   HGB 10.7*  < > 10.7* 11.2* 11.6* 11.1*  HCT 32.8*  < > 31.6* 33.0* 34.3* 32.3*  PLT 218  --  87*  --  75*  --   < > = values in this interval not displayed.  Coag's  Recent Labs Lab 12/26/15 1740 12/27/15 12/27/15 0400  APTT 29  --   --  INR 2.01* 1.35 1.37    Sepsis Markers  Recent Labs Lab 12/27/15 0040  LATICACIDVEN 2.4*    ABG  Recent Labs Lab 12/26/15 1708 12/26/15 1720 12/26/15 1855  PHART 7.372 7.344* 7.268*  PCO2ART 35.1 36.1 38.5  PO2ART 112.0* 121.0* 465.0*    Liver Enzymes  Recent Labs Lab 12/27/15 12/27/15 0400  AST 51* 45*  ALT 28 25  ALKPHOS 66 59  BILITOT 1.2 1.1  ALBUMIN 3.5 3.1*    Cardiac Enzymes  Recent Labs Lab 12/26/15 1740 12/27/15 12/27/15 0400  TROPONINI 0.03 0.62* 1.20*    Glucose  Recent Labs Lab 12/26/15 2036 12/26/15 2358  GLUCAP 209* 212*    Imaging Ct Pelvis W Contrast  12/26/2015  CLINICAL DATA:  75 year old with large pelvic hematoma following left hip replacement. Evaluate for pelvic bleeding. EXAM: CT PELVIS  WITH CONTRAST TECHNIQUE: Multidetector CT imaging of the pelvis was performed using the standard protocol following the bolus administration of intravenous contrast. CONTRAST:  100 mL Isovue COMPARISON:  None. FINDINGS: Vascular structures: The distal abdominal aorta and common iliac arteries are patent. There is active contrast extravasation in the left hemipelvis originating from the proximal left common femoral artery or distal left external iliac artery. Bleeding is originating near the origin of the left inferior epigastric artery but may be separate from this vessel. The left common femoral artery and the proximal left femoral arteries are patent. The right iliac arteries and right femoral arteries are patent. The delayed images demonstrate additional contrast extravasation within the large hematoma within the anterior pelvis and lower abdomen. There is fluid tracking into the left abdomen. There is fluid and gas tracking up the left iliacus muscle. Expected gas around the left hip from the recent hip replacement. Fluid in the pelvis. Limited evaluation of pelvic structures due to the large amount of blood. The left hip arthroplasty is located. 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 IMPRESSION: Active bleeding in the pelvis. The bleeding is originating from the proximal left common femoral artery or the distal left external iliac artery. The bleeding is near the origin of the left inferior epigastric artery. Large amount of blood and hematoma formation throughout the abdomen and pelvis. Expected postsurgical changes from left hip arthroplasty. Electronically Signed   By: Markus Daft M.D.   On: 12/26/2015 18:55   Ir Angiogram Extremity Left  12/26/2015  INDICATION: 75 year old female with a history of a hip arthroplasty and hypotension. EXAM: IR EMBO ART VEN HEMORR LYMPH EXTRAV INC GUIDE ROADMAPPING; IR ULTRASOUND GUIDANCE VASC ACCESS RIGHT; ARTERIOGRAPHY; PELVIC SELECTIVE ARTERIOGRAPHY; LEFT  EXTREMITY ARTERIOGRAPHY; ADDITIONAL ARTERIOGRAPHY MEDICATIONS: None ANESTHESIA/SEDATION: Patient was intubated. Anesthesia team managed the endotracheal airway CONTRAST:  50 cc Isovue FLUOROSCOPY TIME:  Fluoroscopy Time: 5 minutes 42 seconds COMPLICATIONS: None PROCEDURE: Informed consent was obtained from the patient following explanation of the procedure, risks, benefits and alternatives. The patient understands, agrees and consents for the procedure. All questions were addressed. A time out was performed prior to the initiation of the procedure. Maximal barrier sterile technique utilized including caps, mask, sterile gowns, sterile gloves, large sterile drape, hand hygiene, and Betadine prep. Patient positioned supine position on the fluoroscopy table. The right inguinal region was prepped and draped in the usual sterile fashion. Ultrasound survey of the right inguinal region was performed with images stored and sent to PACs. A micropuncture needle was used access the right common femoral artery under ultrasound. With excellent arterial blood flow returned, and an .018 micro wire was passed  through the needle, observed enter the abdominal aorta under fluoroscopy. The needle was removed, and a micropuncture sheath was placed over the wire. The inner dilator and wire were removed, and an 035 Bentson wire was advanced under fluoroscopy into the abdominal aorta. The sheath was removed and a standard 5 [place] vascular sheath was placed. The dilator was removed and the sheath was flushed. Omni Flush catheter was used to navigate a Bentson wire over the aortic bifurcation into the left iliac system. Catheter was exchanged for a C2 catheter advanced into the hypogastric artery with an angiogram performed. Catheter was withdrawn and redirected into the external iliac artery. Angiogram was performed. A Rosen wire was advanced into the proximal femoral system and the catheter was withdrawn. Six French right tip sheath was  advanced over the bifurcation into the left iliac system. 7 mm x 4 cm balloon was advanced over the Rosen wire into the proximal left external iliac artery. Balloon was gently inflated to low atmospheric pressure, with inflation of approximately 4 minutes. At this time, measurements of the left external iliac artery were performed. Balloon was deflated, the balloon catheter was advanced into the proximal left femoral system, and the 035 Rosen wire was exchanged for an 018 S-V8 wire. Balloon catheter was removed. A 71mm x 69mm Viabahn stent graft was select thinned and advanced over the 018 wire to the targeted the avulsed artery. Stent graft was deployed. Balloon angioplasty to the nominal diameter of 6 mm was performed. Final angiogram was performed. The 6 French 35 cm right tip sheath was exchanged for a standard 10 cm 6 French sheath. Sheath was elected to remain given the patient's coagulopathy. Patient tolerated the procedure well with no significant blood loss. IMPRESSION: Status post left lower extremity angiogram with emergent covered stent embolization of distal external iliac artery branch vessel pseudoaneurysm/avulsion, with placement of stent graft. Signed, Dulcy Fanny. Earleen Newport, DO [facility] Radiology Electronically Signed   By: Corrie Mckusick D.O.   On: 12/26/2015 22:45   Ir Angiogram Pelvis Selective Or Supraselective  12/26/2015  INDICATION: 75 year old female with a history of a hip arthroplasty and hypotension. EXAM: IR EMBO ART VEN HEMORR LYMPH EXTRAV INC GUIDE ROADMAPPING; IR ULTRASOUND GUIDANCE VASC ACCESS RIGHT; ARTERIOGRAPHY; PELVIC SELECTIVE ARTERIOGRAPHY; LEFT EXTREMITY ARTERIOGRAPHY; ADDITIONAL ARTERIOGRAPHY MEDICATIONS: None ANESTHESIA/SEDATION: Patient was intubated. Anesthesia team managed the endotracheal airway CONTRAST:  50 cc Isovue FLUOROSCOPY TIME:  Fluoroscopy Time: 5 minutes 42 seconds COMPLICATIONS: None PROCEDURE: Informed consent  was obtained from the patient following explanation of the procedure, risks, benefits and alternatives. The patient understands, agrees and consents for the procedure. All questions were addressed. A time out was performed prior to the initiation of the procedure. Maximal barrier sterile technique utilized including caps, mask, sterile gowns, sterile gloves, large sterile drape, hand hygiene, and Betadine prep. Patient positioned supine position on the fluoroscopy table. The right inguinal region was prepped and draped in the usual sterile fashion. Ultrasound survey of the right inguinal region was performed with images stored and sent to PACs. A micropuncture needle was used access the right common femoral artery under ultrasound. With excellent arterial blood flow returned, and an .018 micro wire was passed through the needle, observed enter the abdominal aorta under fluoroscopy. The needle was removed, and a micropuncture sheath was placed over the wire. The inner dilator and wire were removed, and an 035 Bentson wire was advanced under fluoroscopy into the abdominal aorta. The sheath was removed and a  standard 5 [place] vascular sheath was placed. The dilator was removed and the sheath was flushed. Omni Flush catheter was used to navigate a Bentson wire over the aortic bifurcation into the left iliac system. Catheter was exchanged for a C2 catheter advanced into the hypogastric artery with an angiogram performed. Catheter was withdrawn and redirected into the external iliac artery. Angiogram was performed. A Rosen wire was advanced into the proximal femoral system and the catheter was withdrawn. Six French right tip sheath was advanced over the bifurcation into the left iliac system. 7 mm x 4 cm balloon was advanced over the Rosen wire into the proximal left external iliac artery. Balloon was gently inflated to low atmospheric pressure, with inflation of approximately 4 minutes. At this time, measurements of the  left external iliac artery were performed. Balloon was deflated, the balloon catheter was advanced into the proximal left femoral system, and the 035 Rosen wire was exchanged for an 018 S-V8 wire. Balloon catheter was removed. A 68mm x 45mm Viabahn stent graft was select thinned and advanced over the 018 wire to the targeted the avulsed artery. Stent graft was deployed. Balloon angioplasty to the nominal diameter of 6 mm was performed. Final angiogram was performed. The 6 French 35 cm right tip sheath was exchanged for a standard 10 cm 6 French sheath. Sheath was elected to remain given the patient's coagulopathy. Patient tolerated the procedure well with no significant blood loss. IMPRESSION: Status post left lower extremity angiogram with emergent covered stent embolization of distal external iliac artery branch vessel pseudoaneurysm/avulsion, with placement of stent graft. Signed, Dulcy Fanny. Earleen Newport, DO [facility] Radiology Electronically Signed   By: Corrie Mckusick D.O.   On: 12/26/2015 22:45   Ir Angiogram Selective Each Additional Vessel  12/26/2015  INDICATION: 75 year old female with a history of a hip arthroplasty and hypotension. EXAM: IR EMBO ART VEN HEMORR LYMPH EXTRAV INC GUIDE ROADMAPPING; IR ULTRASOUND GUIDANCE VASC ACCESS RIGHT; ARTERIOGRAPHY; PELVIC SELECTIVE ARTERIOGRAPHY; LEFT EXTREMITY ARTERIOGRAPHY; ADDITIONAL ARTERIOGRAPHY MEDICATIONS: None ANESTHESIA/SEDATION: Patient was intubated. Anesthesia team managed the endotracheal airway CONTRAST:  50 cc Isovue FLUOROSCOPY TIME:  Fluoroscopy Time: 5 minutes 42 seconds COMPLICATIONS: None PROCEDURE: Informed consent was obtained from the patient following explanation of the procedure, risks, benefits and alternatives. The patient understands, agrees and consents for the procedure. All questions were addressed. A time out was performed prior to the initiation of the procedure. Maximal barrier sterile  technique utilized including caps, mask, sterile gowns, sterile gloves, large sterile drape, hand hygiene, and Betadine prep. Patient positioned supine position on the fluoroscopy table. The right inguinal region was prepped and draped in the usual sterile fashion. Ultrasound survey of the right inguinal region was performed with images stored and sent to PACs. A micropuncture needle was used access the right common femoral artery under ultrasound. With excellent arterial blood flow returned, and an .018 micro wire was passed through the needle, observed enter the abdominal aorta under fluoroscopy. The needle was removed, and a micropuncture sheath was placed over the wire. The inner dilator and wire were removed, and an 035 Bentson wire was advanced under fluoroscopy into the abdominal aorta. The sheath was removed and a standard 5 [place] vascular sheath was placed. The dilator was removed and the sheath was flushed. Omni Flush catheter was used to navigate a Bentson wire over the aortic bifurcation into the left iliac system. Catheter was exchanged for a C2 catheter advanced into the hypogastric artery with an  angiogram performed. Catheter was withdrawn and redirected into the external iliac artery. Angiogram was performed. A Rosen wire was advanced into the proximal femoral system and the catheter was withdrawn. Six French right tip sheath was advanced over the bifurcation into the left iliac system. 7 mm x 4 cm balloon was advanced over the Rosen wire into the proximal left external iliac artery. Balloon was gently inflated to low atmospheric pressure, with inflation of approximately 4 minutes. At this time, measurements of the left external iliac artery were performed. Balloon was deflated, the balloon catheter was advanced into the proximal left femoral system, and the 035 Rosen wire was exchanged for an 018 S-V8 wire. Balloon catheter was removed. A 78mm x 57mm Viabahn stent graft was select thinned and  advanced over the 018 wire to the targeted the avulsed artery. Stent graft was deployed. Balloon angioplasty to the nominal diameter of 6 mm was performed. Final angiogram was performed. The 6 French 35 cm right tip sheath was exchanged for a standard 10 cm 6 French sheath. Sheath was elected to remain given the patient's coagulopathy. Patient tolerated the procedure well with no significant blood loss. IMPRESSION: Status post left lower extremity angiogram with emergent covered stent embolization of distal external iliac artery branch vessel pseudoaneurysm/avulsion, with placement of stent graft. Signed, Dulcy Fanny. Earleen Newport, DO [facility] Radiology Electronically Signed   By: Corrie Mckusick D.O.   On: 12/26/2015 22:45   Ir Angiogram Follow Up Study  12/26/2015  INDICATION: 76 year old female with a history of a hip arthroplasty and hypotension. EXAM: IR EMBO ART VEN HEMORR LYMPH EXTRAV INC GUIDE ROADMAPPING; IR ULTRASOUND GUIDANCE VASC ACCESS RIGHT; ARTERIOGRAPHY; PELVIC SELECTIVE ARTERIOGRAPHY; LEFT EXTREMITY ARTERIOGRAPHY; ADDITIONAL ARTERIOGRAPHY MEDICATIONS: None ANESTHESIA/SEDATION: Patient was intubated. Anesthesia team managed the endotracheal airway CONTRAST:  50 cc Isovue FLUOROSCOPY TIME:  Fluoroscopy Time: 5 minutes 42 seconds COMPLICATIONS: None PROCEDURE: Informed consent was obtained from the patient following explanation of the procedure, risks, benefits and alternatives. The patient understands, agrees and consents for the procedure. All questions were addressed. A time out was performed prior to the initiation of the procedure. Maximal barrier sterile technique utilized including caps, mask, sterile gowns, sterile gloves, large sterile drape, hand hygiene, and Betadine prep. Patient positioned supine position on the fluoroscopy table. The right inguinal region was prepped and draped in the usual sterile fashion. Ultrasound survey of the right inguinal  region was performed with images stored and sent to PACs. A micropuncture needle was used access the right common femoral artery under ultrasound. With excellent arterial blood flow returned, and an .018 micro wire was passed through the needle, observed enter the abdominal aorta under fluoroscopy. The needle was removed, and a micropuncture sheath was placed over the wire. The inner dilator and wire were removed, and an 035 Bentson wire was advanced under fluoroscopy into the abdominal aorta. The sheath was removed and a standard 5 [place] vascular sheath was placed. The dilator was removed and the sheath was flushed. Omni Flush catheter was used to navigate a Bentson wire over the aortic bifurcation into the left iliac system. Catheter was exchanged for a C2 catheter advanced into the hypogastric artery with an angiogram performed. Catheter was withdrawn and redirected into the external iliac artery. Angiogram was performed. A Rosen wire was advanced into the proximal femoral system and the catheter was withdrawn. Six French right tip sheath was advanced over the bifurcation into the left iliac system. 7 mm x 4 cm  balloon was advanced over the [facility] wire into the proximal left external iliac artery. Balloon was gently inflated to low atmospheric pressure, with inflation of approximately 4 minutes. At this time, measurements of the left external iliac artery were performed. Balloon was deflated, the balloon catheter was advanced into the proximal left femoral system, and the 035 Rosen wire was exchanged for an 018 S-V8 wire. Balloon catheter was removed. A 70mm x 41mm Viabahn stent graft was select thinned and advanced over the 018 wire to the targeted the avulsed artery. Stent graft was deployed. Balloon angioplasty to the nominal diameter of 6 mm was performed. Final angiogram was performed. The 6 French 35 cm right tip sheath was exchanged for a standard 10 cm 6 French sheath. Sheath was elected to remain given  the patient's coagulopathy. Patient tolerated the procedure well with no significant blood loss. IMPRESSION: Status post left lower extremity angiogram with emergent covered stent embolization of distal external iliac artery branch vessel pseudoaneurysm/avulsion, with placement of stent graft. Signed, Dulcy Fanny. Earleen Newport, DO [facility] Radiology Electronically Signed   By: Corrie Mckusick D.O.   On: 12/26/2015 22:45   Ir US Guide Vasc Access Right  12/26/2015  INDICATION: 75 year old female with a history of a hip arthroplasty and hypotension. EXAM: IR EMBO ART VEN HEMORR LYMPH EXTRAV INC GUIDE ROADMAPPING; IR ULTRASOUND GUIDANCE VASC ACCESS RIGHT; ARTERIOGRAPHY; PELVIC SELECTIVE ARTERIOGRAPHY; LEFT EXTREMITY ARTERIOGRAPHY; ADDITIONAL ARTERIOGRAPHY MEDICATIONS: None ANESTHESIA/SEDATION: Patient was intubated. Anesthesia team managed the endotracheal airway CONTRAST:  50 cc Isovue FLUOROSCOPY TIME:  Fluoroscopy Time: 5 minutes 42 seconds COMPLICATIONS: None PROCEDURE: Informed consent was obtained from the patient following explanation of the procedure, risks, benefits and alternatives. The patient understands, agrees and consents for the procedure. All questions were addressed. A time out was performed prior to the initiation of the procedure. Maximal barrier sterile technique utilized including caps, mask, sterile gowns, sterile gloves, large sterile drape, hand hygiene, and Betadine prep. Patient positioned supine position on the fluoroscopy table. The right inguinal region was prepped and draped in the usual sterile fashion. Ultrasound survey of the right inguinal region was performed with images stored and sent to PACs. A micropuncture needle was used access the right common femoral artery under ultrasound. With excellent arterial blood flow returned, and an .018 micro wire was passed through the needle, observed enter the abdominal aorta under fluoroscopy. The  needle was removed, and a micropuncture sheath was placed over the wire. The inner dilator and wire were removed, and an 035 Bentson wire was advanced under fluoroscopy into the abdominal aorta. The sheath was removed and a standard 5 [place] vascular sheath was placed. The dilator was removed and the sheath was flushed. Omni Flush catheter was used to navigate a Bentson wire over the aortic bifurcation into the left iliac system. Catheter was exchanged for a C2 catheter advanced into the hypogastric artery with an angiogram performed. Catheter was withdrawn and redirected into the external iliac artery. Angiogram was performed. A Rosen wire was advanced into the proximal femoral system and the catheter was withdrawn. Six French right tip sheath was advanced over the bifurcation into the left iliac system. 7 mm x 4 cm balloon was advanced over the Rosen wire into the proximal left external iliac artery. Balloon was gently inflated to low atmospheric pressure, with inflation of approximately 4 minutes. At this time, measurements of the left external iliac artery were performed. Balloon was deflated, the balloon catheter was advanced into  the proximal left femoral system, and the 035 Rosen wire was exchanged for an 018 S-V8 wire. Balloon catheter was removed. A 7mm x 70mm Viabahn stent graft was select thinned and advanced over the 018 wire to the targeted the avulsed artery. Stent graft was deployed. Balloon angioplasty to the nominal diameter of 6 mm was performed. Final angiogram was performed. The 6 French 35 cm right tip sheath was exchanged for a standard 10 cm 6 French sheath. Sheath was elected to remain given the patient's coagulopathy. Patient tolerated the procedure well with no significant blood loss. IMPRESSION: Status post left lower extremity angiogram with emergent covered stent embolization of distal external iliac artery branch vessel pseudoaneurysm/avulsion, with placement of stent graft. Signed,  Dulcy Fanny. Earleen Newport, DO [facility] Radiology Electronically Signed   By: Corrie Mckusick D.O.   On: 12/26/2015 22:45   Dg Chest Port 1 View  12/26/2015  CLINICAL DATA:  Patient status post ET tube placement. EXAM: PORTABLE CHEST 1 VIEW COMPARISON:  Chest radiograph 12/25/2015. FINDINGS: ET tube terminates in the mid trachea. Left subclavian central venous catheter tip projects over the superior vena cava. Enteric tube tip and side port terminates in the midesophagus, recommend advancement. Stable cardiac and mediastinal contours. Low lung volumes. Elevation of the right hemidiaphragm. Right-greater-than-left interstitial pulmonary opacities. No large pleural effusion or pneumothorax. IMPRESSION: ET tube tip and side port terminate in the midesophagus, recommend advancement. Bilateral, left-greater-than-right, interstitial opacities which may represent pulmonary edema. These results will be called to the ordering clinician or representative by the Radiologist Assistant, and communication documented in the PACS or zVision Dashboard. Electronically Signed   By: Lovey Newcomer M.D.   On: 12/26/2015 21:07   Dg Hip Operative Unilat W Or W/o Pelvis Left  12/26/2015  CLINICAL DATA:  Left anterior hip replacement due to fracture. EXAM: OPERATIVE LEFT HIP (WITH PELVIS IF PERFORMED) 1 VIEW TECHNIQUE: Fluoroscopic spot image(s) were submitted for interpretation post-operatively. COMPARISON:  12/25/2015 FINDINGS: A total left hip arthroplasty has been performed. Alignment of the prosthesis is grossly normal on this single view. No evidence for a periprosthetic fracture. IMPRESSION: Left hip replacement without complicating features. Electronically Signed   By: Markus Daft M.D.   On: 12/26/2015 16:34   [place] Guide Roadmapping  12/26/2015  INDICATION: 75 year old female with a history of a hip arthroplasty and hypotension. EXAM: IR EMBO ART VEN  HEMORR LYMPH EXTRAV INC GUIDE ROADMAPPING; IR ULTRASOUND GUIDANCE VASC ACCESS RIGHT; ARTERIOGRAPHY; PELVIC SELECTIVE ARTERIOGRAPHY; LEFT EXTREMITY ARTERIOGRAPHY; ADDITIONAL ARTERIOGRAPHY MEDICATIONS: None ANESTHESIA/SEDATION: Patient was intubated. Anesthesia team managed the endotracheal airway CONTRAST:  50 cc Isovue FLUOROSCOPY TIME:  Fluoroscopy Time: 5 minutes 42 seconds COMPLICATIONS: None PROCEDURE: Informed consent was obtained from the patient following explanation of the procedure, risks, benefits and alternatives. The patient understands, agrees and consents for the procedure. All questions were addressed. A time out was performed prior to the initiation of the procedure. Maximal barrier sterile technique utilized including caps, mask, sterile gowns, sterile gloves, large sterile drape, hand hygiene, and Betadine prep. Patient positioned supine position on the fluoroscopy table. The right inguinal region was prepped and draped in the usual sterile fashion. Ultrasound survey of the right inguinal region was performed with images stored and sent to PACs. A micropuncture needle was used access the right common femoral artery under ultrasound. With excellent arterial blood flow returned, and an .018 micro wire was passed through the needle,  observed enter the abdominal aorta under fluoroscopy. The needle was removed, and a micropuncture sheath was placed over the wire. The inner dilator and wire were removed, and an 035 Bentson wire was advanced under fluoroscopy into the abdominal aorta. The sheath was removed and a standard 5 [place] vascular sheath was placed. The dilator was removed and the sheath was flushed. Omni Flush catheter was used to navigate a Bentson wire over the aortic bifurcation into the left iliac system. Catheter was exchanged for a C2 catheter advanced into the hypogastric artery with an angiogram performed. Catheter was withdrawn and redirected into the external iliac artery. Angiogram  was performed. A Rosen wire was advanced into the proximal femoral system and the catheter was withdrawn. Six French right tip sheath was advanced over the bifurcation into the left iliac system. 7 mm x 4 cm balloon was advanced over the Rosen wire into the proximal left external iliac artery. Balloon was gently inflated to low atmospheric pressure, with inflation of approximately 4 minutes. At this time, measurements of the left external iliac artery were performed. Balloon was deflated, the balloon catheter was advanced into the proximal left femoral system, and the 035 Rosen wire was exchanged for an 018 S-V8 wire. Balloon catheter was removed. A 2mm x 49mm Viabahn stent graft was select thinned and advanced over the 018 wire to the targeted the avulsed artery. Stent graft was deployed. Balloon angioplasty to the nominal diameter of 6 mm was performed. Final angiogram was performed. The 6 French 35 cm right tip sheath was exchanged for a standard 10 cm 6 French sheath. Sheath was elected to remain given the patient's coagulopathy. Patient tolerated the procedure well with no significant blood loss. IMPRESSION: Status post left lower extremity angiogram with emergent covered stent embolization of distal external iliac artery branch vessel pseudoaneurysm/avulsion, with placement of stent graft. Signed, Dulcy Fanny. Earleen Newport, DO [facility] Electronically Signed   By: Corrie Mckusick D.O.   On: 12/26/2015 22:45  PCXR: bilateral pulmonary edema ETT good position. NGT was in mid esophagus.   STUDIES:  Imaging studies as above  CULTURES: None  ANTIBIOTICS: Perioperative Ancef  SIGNIFICANT EVENTS: See HPI  LINES/TUBES: L [place] 6/16 Foley 6/16  PIV ETT 6/16  DISCUSSION: Ms. Willocks is a 81F who remains intubated after a complicated L hip replacement with arterial avulsion of the left external iliac artery requiring covered stent placement by IR c/b  intraabdominal/pelvic hematoma. Last hgb was stable. Issues this am have been hypotension (which may have been sedation related) and now SVT. She does have mild trop I rise.  Will add low dose scheduled BB. IF her next hgb is stable, and HR under control we will extubate this am. She has a pending ECHO, will need cardiology consult IF she has wall motion abnormality OR depressed EF.   ASSESSMENT / PLAN:  PULMONARY A: Need for mechanical ventilation Mild Pulmonary edema  ->excellent Vt on SBT. F/vt predicts successful extubation.  P:   Continue  SBT: If next cbc is stable and we can get her HR under control will plan for extubation later today  Lasix X1 (if bp remains stable)  CARDIOVASCULAR A:  Troponin elevation -->likely demand ischemia  Heart murmur SVT -->not clear what her baseline cardiac fxn is like. Certainly at risk for demand ischemia.  P:  Monitor for hemodynamic instability Trend troponin F/u echo-->if has wall motion abnormality of depressed EF then she gets cards eval Add low  dose scheduled BB No heparin given bleeding issues.  Lasix x 1 (post extubation if BP stays stable).  RENAL A:   No acute issues P:   Await repeat labs  GASTROINTESTINAL A:   Concern for abdominal compartment syndrome-->seen by surgery this am. Abd softer. Bladder pressures higher but making urine. Surgery feels no evidence of compartment syndrome currently.  P:   NPO Bladder pressure q shift  Famotidine for ppx  HEMATOLOGIC A:   Arterial hemorrhage s/p stenting and requiring massive transfusion propofol P:  Trend CBC Transfuse as needed  INFECTIOUS A:   No acute issues - leukocytosis likely stress response S/p periop Ancef P:   Monitor  ENDOCRINE A:   No acute issues  P:   Trend glucose on chemistries   NEUROLOGIC A:   Anxiety and post-op pain  P:   RASS goal: -1 fent gtt while intubated then transition to PRN  FAMILY  - Updates: no family available  APP time  30 minutes: included: management of hemodynamics, and discussion of plan of care w/ bedside Nurse, RT re: weaning efforts AND d/w surgical team.   Erick Colace ACNP-BC [place] Pager # [phone] OR # [phone] if no answer   12/27/2015, 8:19 AM  Attending Note:  75 year old female s/p fall and hip fracture.  Underwent repair with associated damage to the iliac artery.  IR repaired damage.  On exam, leg is neurovascularly intact and lungs are clear.  I reviewed CXR myself, ETT ok.  Patient is recovering nicely.  Will proceed with weaning and anticipate will be able to extubate today.  In the meantime, monitor chemistry and replace electrolytes.  CCS consulted for concern for abdominal compartment syndrome, no interventions necessary for now.  Monitor closely in the ICU for 24 hours post extubation.  The patient is critically ill with multiple organ systems failure and requires high complexity decision making for assessment and support, frequent evaluation and titration of therapies, application of advanced monitoring technologies and extensive interpretation of multiple databases.   Critical Care Time devoted to patient care services described in this note is  35  Minutes. This time reflects time of care of this signee Dr [name]. This critical care time does not reflect procedure time, or teaching time or supervisory time of PA/NP/Med student/Med Resident etc but could involve care discussion time.  Rush Farmer, M.D. [facility] Pulmonary/Critical Care Medicine. Pager: [phone]. After hours pager: [phone].

## 2015-12-28 ENCOUNTER — Inpatient Hospital Stay (HOSPITAL_COMMUNITY): Payer: Medicare HMO

## 2015-12-28 ENCOUNTER — Encounter (HOSPITAL_COMMUNITY): Payer: Self-pay | Admitting: Physician Assistant

## 2015-12-28 DIAGNOSIS — I213 ST elevation (STEMI) myocardial infarction of unspecified site: Secondary | ICD-10-CM

## 2015-12-28 DIAGNOSIS — I2699 Other pulmonary embolism without acute cor pulmonale: Secondary | ICD-10-CM

## 2015-12-28 DIAGNOSIS — J9600 Acute respiratory failure, unspecified whether with hypoxia or hypercapnia: Secondary | ICD-10-CM | POA: Diagnosis present

## 2015-12-28 DIAGNOSIS — R570 Cardiogenic shock: Secondary | ICD-10-CM

## 2015-12-28 DIAGNOSIS — I513 Intracardiac thrombosis, not elsewhere classified: Secondary | ICD-10-CM | POA: Diagnosis present

## 2015-12-28 DIAGNOSIS — I2609 Other pulmonary embolism with acute cor pulmonale: Secondary | ICD-10-CM

## 2015-12-28 DIAGNOSIS — I469 Cardiac arrest, cause unspecified: Secondary | ICD-10-CM

## 2015-12-28 DIAGNOSIS — R578 Other shock: Secondary | ICD-10-CM | POA: Diagnosis present

## 2015-12-28 HISTORY — DX: Other pulmonary embolism without acute cor pulmonale: I26.99

## 2015-12-28 LAB — CBC
HCT: 24.8 % — ABNORMAL LOW (ref 36.0–46.0)
HCT: 30.3 % — ABNORMAL LOW (ref 36.0–46.0)
HEMATOCRIT: 27.7 % — AB (ref 36.0–46.0)
Hemoglobin: 9.3 g/dL — ABNORMAL LOW (ref 12.0–15.0)
Hemoglobin: 8.3 g/dL — ABNORMAL LOW (ref 12.0–15.0)
Hemoglobin: 10.2 g/dL — ABNORMAL LOW (ref 12.0–15.0)
MCH: 29.2 pg (ref 26.0–34.0)
MCH: 29.4 pg (ref 26.0–34.0)
MCH: 29.9 pg (ref 26.0–34.0)
MCHC: 33.6 g/dL (ref 30.0–36.0)
MCHC: 33.5 g/dL (ref 30.0–36.0)
MCHC: 33.7 g/dL (ref 30.0–36.0)
MCV: 87.1 fL (ref 78.0–100.0)
MCV: 87.9 fL (ref 78.0–100.0)
MCV: 88.9 fL (ref 78.0–100.0)
PLATELETS: 81 10*3/uL — AB (ref 150–400)
PLATELETS: 72 10*3/uL — AB (ref 150–400)
Platelets: 84 10*3/uL — ABNORMAL LOW (ref 150–400)
RBC: 3.18 MIL/uL — ABNORMAL LOW (ref 3.87–5.11)
RBC: 2.82 MIL/uL — ABNORMAL LOW (ref 3.87–5.11)
RBC: 3.41 MIL/uL — ABNORMAL LOW (ref 3.87–5.11)
RDW: 16 % — AB (ref 11.5–15.5)
RDW: 16.2 % — AB (ref 11.5–15.5)
RDW: 15.1 % (ref 11.5–15.5)
WBC: 11.3 10*3/uL — ABNORMAL HIGH (ref 4.0–10.5)
WBC: 11.1 10*3/uL — ABNORMAL HIGH (ref 4.0–10.5)
WBC: 13.7 10*3/uL — ABNORMAL HIGH (ref 4.0–10.5)

## 2015-12-28 LAB — POCT I-STAT 3, ART BLOOD GAS (G3+)
ACID-BASE DEFICIT: 11 mmol/L — AB (ref 0.0–2.0)
Acid-base deficit: 6 mmol/L — ABNORMAL HIGH (ref 0.0–2.0)
BICARBONATE: 17 meq/L — AB (ref 20.0–24.0)
BICARBONATE: 19.2 meq/L — AB (ref 20.0–24.0)
O2 Saturation: 100 %
O2 Saturation: 95 %
PO2 ART: 281 mmHg — AB (ref 80.0–100.0)
PO2 ART: 73 mmHg — AB (ref 80.0–100.0)
TCO2: 18 mmol/L (ref 0–100)
TCO2: 20 mmol/L (ref 0–100)
pCO2 arterial: 46.7 mmHg — ABNORMAL HIGH (ref 35.0–45.0)
pCO2 arterial: 32.9 mmHg — ABNORMAL LOW (ref 35.0–45.0)
pH, Arterial: 7.168 — CL (ref 7.350–7.450)
pH, Arterial: 7.371 (ref 7.350–7.450)

## 2015-12-28 LAB — BASIC METABOLIC PANEL
Anion gap: 7 (ref 5–15)
BUN: 15 mg/dL (ref 6–20)
CHLORIDE: 110 mmol/L (ref 101–111)
CO2: 23 mmol/L (ref 22–32)
Calcium: 8.2 mg/dL — ABNORMAL LOW (ref 8.9–10.3)
Creatinine, Ser: 0.84 mg/dL (ref 0.44–1.00)
GFR calc non Af Amer: >60 mL/min (ref >60)
Glucose, Bld: 133 mg/dL — ABNORMAL HIGH (ref 65–99)
Potassium: 3.8 mmol/L (ref 3.5–5.1)
Sodium: 140 mmol/L (ref 135–145)

## 2015-12-28 LAB — GLUCOSE, CAPILLARY: Glucose-Capillary: 279 mg/dL — ABNORMAL HIGH (ref 65–99)

## 2015-12-28 LAB — FIBRINOGEN
FIBRINOGEN: 318 mg/dL (ref 204–475)
Fibrinogen: 258 mg/dL (ref 204–475)

## 2015-12-28 LAB — ECHOCARDIOGRAM LIMITED
FS: 22 % — AB (ref 28–44)
HEIGHTINCHES: 67 in
IVS/LV PW RATIO, ED: 0.94
LA ID, A-P, ES: 33 mm
LADIAMINDEX: 1.76 cm/m2
LDCA: 3.14 cm2
LEFT ATRIUM END SYS DIAM: 33 mm
LV PW d: 11.3 mm — AB (ref 0.6–1.1)
LVOTD: 20 mm
Weight: 2603.19 oz

## 2015-12-28 LAB — HEPARIN LEVEL (UNFRACTIONATED)
HEPARIN UNFRACTIONATED: 0.76 [IU]/mL — AB (ref 0.30–0.70)
HEPARIN UNFRACTIONATED: 0.82 [IU]/mL — AB (ref 0.30–0.70)
HEPARIN UNFRACTIONATED: 0.89 [IU]/mL — AB (ref 0.30–0.70)
Heparin Unfractionated: <0.1 IU/mL — ABNORMAL LOW (ref 0.30–0.70)

## 2015-12-28 MED ORDER — SODIUM CHLORIDE 0.9 % IV SOLN
Freq: Once | INTRAVENOUS | Status: AC
  Administered 2015-12-28: 23:00:00 via INTRAVENOUS

## 2015-12-28 MED ORDER — PHENYLEPHRINE HCL 10 MG/ML IJ SOLN
0.0000 ug/min | INTRAMUSCULAR | Status: DC
  Administered 2015-12-28: 20 ug/min via INTRAVENOUS
  Filled 2015-12-28 (×2): qty 1

## 2015-12-28 MED ORDER — SODIUM CHLORIDE 0.9 % IV SOLN: 250.0000 mL | INTRAVENOUS | Status: DC | PRN

## 2015-12-28 MED ORDER — SODIUM CHLORIDE 0.9% FLUSH
3.0000 mL | Freq: Two times a day (BID) | INTRAVENOUS | Status: DC
  Administered 2015-12-28: 10 mL via INTRAVENOUS

## 2015-12-28 MED ORDER — SODIUM CHLORIDE 0.9 % IV BOLUS (SEPSIS)
1000.0000 mL | Freq: Once | INTRAVENOUS | Status: AC
  Administered 2015-12-28: 1000 mL via INTRAVENOUS

## 2015-12-28 MED ORDER — MIDAZOLAM HCL 2 MG/2ML IJ SOLN
INTRAMUSCULAR | Status: AC
  Filled 2015-12-28: qty 2

## 2015-12-28 MED ORDER — FLUMAZENIL 0.5 MG/5ML IV SOLN
0.2000 mg | Freq: Once | INTRAVENOUS | Status: AC
  Administered 2015-12-28: 0.2 mg via INTRAVENOUS
  Filled 2015-12-28: qty 5

## 2015-12-28 MED ORDER — INSULIN ASPART 100 UNIT/ML ~~LOC~~ SOLN
0.0000 [IU] | SUBCUTANEOUS | Status: DC
  Administered 2015-12-28: 5 [IU] via SUBCUTANEOUS
  Administered 2015-12-29 – 2015-12-30 (×3): 2 [IU] via SUBCUTANEOUS
  Administered 2015-12-30: 3 [IU] via SUBCUTANEOUS
  Administered 2015-12-30 (×2): 2 [IU] via SUBCUTANEOUS
  Administered 2015-12-30: 3 [IU] via SUBCUTANEOUS
  Administered 2015-12-30 – 2015-12-31 (×2): 2 [IU] via SUBCUTANEOUS
  Administered 2015-12-31: 5 [IU] via SUBCUTANEOUS
  Administered 2015-12-31 – 2016-01-01 (×4): 2 [IU] via SUBCUTANEOUS
  Administered 2016-01-01: 3 [IU] via SUBCUTANEOUS
  Administered 2016-01-01: 2 [IU] via SUBCUTANEOUS
  Administered 2016-01-01 (×2): 3 [IU] via SUBCUTANEOUS
  Administered 2016-01-01: 2 [IU] via SUBCUTANEOUS
  Administered 2016-01-02 (×2): 3 [IU] via SUBCUTANEOUS
  Administered 2016-01-02: 2 [IU] via SUBCUTANEOUS
  Administered 2016-01-02: 5 [IU] via SUBCUTANEOUS
  Administered 2016-01-02 – 2016-01-03 (×2): 3 [IU] via SUBCUTANEOUS
  Administered 2016-01-03: 2 [IU] via SUBCUTANEOUS
  Administered 2016-01-03 (×4): 3 [IU] via SUBCUTANEOUS
  Administered 2016-01-04 (×3): 2 [IU] via SUBCUTANEOUS
  Administered 2016-01-04: 3 [IU] via SUBCUTANEOUS
  Administered 2016-01-04 (×2): 2 [IU] via SUBCUTANEOUS
  Administered 2016-01-05: 3 [IU] via SUBCUTANEOUS
  Administered 2016-01-05: 2 [IU] via SUBCUTANEOUS
  Administered 2016-01-05 (×4): 3 [IU] via SUBCUTANEOUS
  Administered 2016-01-05: 2 [IU] via SUBCUTANEOUS
  Administered 2016-01-06: 3 [IU] via SUBCUTANEOUS
  Administered 2016-01-06: 5 [IU] via SUBCUTANEOUS
  Administered 2016-01-06 (×3): 2 [IU] via SUBCUTANEOUS
  Administered 2016-01-07 (×2): 3 [IU] via SUBCUTANEOUS
  Administered 2016-01-07 (×2): 2 [IU] via SUBCUTANEOUS
  Administered 2016-01-08: 5 [IU] via SUBCUTANEOUS
  Administered 2016-01-08 (×2): 2 [IU] via SUBCUTANEOUS
  Administered 2016-01-08 – 2016-01-09 (×3): 3 [IU] via SUBCUTANEOUS
  Administered 2016-01-09 – 2016-01-11 (×10): 2 [IU] via SUBCUTANEOUS
  Administered 2016-01-11: 3 [IU] via SUBCUTANEOUS
  Administered 2016-01-11 – 2016-01-12 (×6): 2 [IU] via SUBCUTANEOUS

## 2015-12-28 MED ORDER — LORAZEPAM 2 MG/ML IJ SOLN
0.5000 mg | INTRAMUSCULAR | Status: DC | PRN
  Administered 2015-12-28: 0.5 mg via INTRAVENOUS
  Filled 2015-12-28: qty 1

## 2015-12-28 MED ORDER — DILTIAZEM HCL 100 MG IV SOLR
5.0000 mg/h | INTRAVENOUS | Status: DC
  Administered 2015-12-28: 7.5 mg/h via INTRAVENOUS
  Administered 2015-12-28: 5 mg/h via INTRAVENOUS
  Filled 2015-12-28 (×2): qty 100

## 2015-12-28 MED ORDER — FENTANYL CITRATE (PF) 100 MCG/2ML IJ SOLN
50.0000 ug | INTRAMUSCULAR | Status: DC | PRN
  Administered 2015-12-28 – 2015-12-29 (×4): 50 ug via INTRAVENOUS
  Filled 2015-12-28 (×4): qty 2

## 2015-12-28 MED ORDER — HEPARIN (PORCINE) IN NACL 100-0.45 UNIT/ML-% IJ SOLN
700.0000 [IU]/h | INTRAMUSCULAR | Status: DC
  Filled 2015-12-28 (×2): qty 250

## 2015-12-28 MED ORDER — MIDAZOLAM HCL 2 MG/2ML IJ SOLN
1.0000 mg | INTRAMUSCULAR | Status: DC | PRN
  Administered 2015-12-29 – 2016-01-01 (×10): 1 mg via INTRAVENOUS
  Filled 2015-12-28 (×10): qty 2

## 2015-12-28 MED ORDER — SODIUM CHLORIDE 0.9 % IV SOLN
INTRAVENOUS | Status: DC
  Administered 2015-12-29: 14:00:00 via INTRAVENOUS

## 2015-12-28 MED ORDER — SODIUM CHLORIDE 0.9% FLUSH: 3.0000 mL | INTRAVENOUS | Status: DC | PRN

## 2015-12-28 MED ORDER — SODIUM CHLORIDE 0.9 % IV SOLN: Freq: Once | INTRAVENOUS | Status: DC

## 2015-12-28 MED ORDER — LIDOCAINE HCL 1 % IJ SOLN
INTRAMUSCULAR | Status: AC
  Administered 2015-12-28: 5 mL
  Filled 2015-12-28: qty 20

## 2015-12-28 MED ORDER — MIDAZOLAM HCL 2 MG/2ML IJ SOLN
INTRAMUSCULAR | Status: AC | PRN
  Administered 2015-12-28: 0.5 mg via INTRAVENOUS

## 2015-12-28 MED ORDER — DILTIAZEM HCL 25 MG/5ML IV SOLN
10.0000 mg | Freq: Once | INTRAVENOUS | Status: DC
  Filled 2015-12-28: qty 5

## 2015-12-28 MED ORDER — FENTANYL CITRATE (PF) 100 MCG/2ML IJ SOLN
INTRAMUSCULAR | Status: AC
  Filled 2015-12-28: qty 2

## 2015-12-28 MED ORDER — DEXTROSE 5 % IV SOLN: 0.0000 ug/min | INTRAVENOUS | Status: DC

## 2015-12-28 MED ORDER — SODIUM CHLORIDE 0.9 % IV SOLN
INTRAVENOUS | Status: DC
  Administered 2015-12-28: 15:00:00 via INTRAVENOUS

## 2015-12-28 MED ORDER — PHENYLEPHRINE HCL 10 MG/ML IJ SOLN
0.0000 ug/min | INTRAMUSCULAR | Status: DC
  Administered 2015-12-28: 20 ug/min via INTRAVENOUS
  Administered 2015-12-29: 50 ug/min via INTRAVENOUS
  Administered 2015-12-29: 80 ug/min via INTRAVENOUS
  Administered 2015-12-31: 70 ug/min via INTRAVENOUS
  Administered 2015-12-31: 80 ug/min via INTRAVENOUS
  Filled 2015-12-28 (×9): qty 4

## 2015-12-28 MED ORDER — DOCUSATE SODIUM 50 MG/5ML PO LIQD
100.0000 mg | Freq: Two times a day (BID) | ORAL | Status: DC
  Administered 2015-12-28 – 2016-01-02 (×9): 100 mg
  Filled 2015-12-28 (×12): qty 10

## 2015-12-28 MED ORDER — SODIUM CHLORIDE 0.9 % IV SOLN
2.0000 mg/h | INTRAVENOUS | Status: DC
  Administered 2015-12-28: 1.997 mg/h via INTRAVENOUS
  Filled 2015-12-28 (×2): qty 24

## 2015-12-28 MED ORDER — DILTIAZEM LOAD VIA INFUSION
10.0000 mg | Freq: Once | INTRAVENOUS | Status: AC
  Administered 2015-12-28: 10 mg via INTRAVENOUS
  Filled 2015-12-28: qty 10

## 2015-12-28 MED ORDER — IOPAMIDOL (ISOVUE-300) INJECTION 61%
INTRAVENOUS | Status: AC
  Administered 2015-12-28: 20 mL
  Filled 2015-12-28: qty 50

## 2015-12-28 MED ORDER — IOPAMIDOL (ISOVUE-370) INJECTION 76%
INTRAVENOUS | Status: AC
  Administered 2015-12-28: 100 mL
  Administered 2015-12-28: 20:00:00
  Filled 2015-12-28: qty 100

## 2015-12-28 NOTE — Progress Notes (Signed)
Subjective: 2 Days Post-Op Procedure(s) (LRB): TOTAL HIP ARTHROPLASTY ANTERIOR APPROACH (Left) Patient reports pain as 4 on 0-10 scale.   Patient is alert with labored breathing on 100% rebreather mask.    Objective: Vital signs in last 24 hours: Temp:  [97.9 F (36.6 C)-100.2 F (37.9 C)] 97.9 F (36.6 C) (06/18 0741) Pulse Rate:  [40-128] 98 (06/18 0900) Resp:  [19-33] 33 (06/18 0900) BP: (109-172)/(69-130) 155/119 mmHg (06/18 0900) SpO2:  [90 %-97 %] 97 % (06/18 0900) FiO2 (%):  [100 %] 100 % (06/18 0800)  Intake/Output from previous day: 06/17 0701 - 06/18 0700 In: 2040.8 [I.V.:1990.8; IV Piggyback:50] Out: 950 [Urine:950] Intake/Output this shift: Total I/O In: 227 [I.V.:177; IV Piggyback:50] Out: 115 [Urine:115]   Recent Labs  12/27/15 12/27/15 0400 12/27/15 0743 12/27/15 1516 12/27/15 2145  HGB 11.6* 11.1* 10.7* 10.5* 10.2*    Recent Labs  12/27/15  12/27/15 0743 12/27/15 1516 12/27/15 2145  WBC 8.3  --  7.2  --   --   RBC 4.02  --  3.65*  --   --   HCT 34.3*  < > 30.7* 30.5* 30.2*  PLT 75*  --  61*  --   --   < > = values in this interval not displayed.  Recent Labs  12/27/15 0400 12/28/15 0038  NA 140 140  K 3.6 3.8  CL 110 110  CO2 22 23  BUN 14 15  CREATININE 0.78 0.84  GLUCOSE 145* 133*  CALCIUM 8.4* 8.2*    Recent Labs  12/27/15 12/27/15 0400  INR 1.35 1.37    Sensation intact distally Intact pulses distally Incision: scant drainage  Assessment/Plan: 2 Days Post-Op Procedure(s) (LRB): TOTAL HIP ARTHROPLASTY ANTERIOR APPROACH (Left)  Embolism of iliac arterial laceration post op   Principal Problem:   Fracture of femoral neck, left (HCC) Active Problems:   Hip fracture (HCC)   Leukocytosis   Dehydration   Atrial thrombus (HCC)   Acute pulmonary embolism (HCC)   At this point in time left leg is stable with good posterior tibial pulses and active motor and sensory function.  The bigger issues of large artial thrombus  and massive PE are being addressed by critical care and cardiovascular surgery.  Appreciate all the consultants and their management.  Emmah Bratcher A. Kaleen Mask Physician Assistant [facility] [phone]  12/28/2015, 10:09 AM   Linda Hedges 12/28/2015, 10:05 AM

## 2015-12-28 NOTE — Progress Notes (Signed)
Subjective: Interval History: none.. Events of yesterday noted. 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 The thrombus probably. Lower extremity DVT negative.   Objective: Vital signs in last 24 hours: Temp:  [97.9 F (36.6 C)-100.2 F (37.9 C)] 97.9 F (36.6 C) (06/18 0741) Pulse Rate:  [40-128] 103 (06/18 0800) Resp:  [13-30] 30 (06/18 0800) BP: (109-172)/(69-130) 172/98 mmHg (06/18 0700) SpO2:  [90 %-97 %] 96 % (06/18 0800) Arterial Line BP: (119-132)/(62) 132/62 mmHg (06/17 1000) FiO2 (%):  [40 %-100 %] 100 % (06/18 0800)  Intake/Output from previous day: 06/17 0701 - 06/18 0700 In: 2040.8 [I.V.:1990.8; IV Piggyback:50] Out: 950 [Urine:950] Intake/Output this shift: Total I/O In: 88.5 [I.V.:88.5] Out: -   Increased respiratory rate but relatively comfortable this morning. Able to answer questions. Alert and oriented. Denies abdominal pain. Abdomen soft and minimally tender. Palpable posterior tibial pulses bilaterally.  Lab Results:  Recent Labs  12/27/15  12/27/15 0743 12/27/15 1516 12/27/15 2145  WBC 8.3  --  7.2  --   --   HGB 11.6*  < > 10.7* 10.5* 10.2*  HCT 34.3*  < > 30.7* 30.5* 30.2*  PLT 75*  --  61*  --   --   < > = values in this interval not displayed. BMET  Recent Labs  12/27/15 0400 12/28/15 0038  NA 140 140  K 3.6 3.8  CL 110 110  CO2 22 23  GLUCOSE 145* 133*  BUN 14 15  CREATININE 0.78 0.84  CALCIUM 8.4* 8.2*    Studies/Results: Dg Chest 1 View  12/25/2015  CLINICAL DATA:  Recent fall today with left hip pain, initial encounter EXAM: CHEST 1 VIEW COMPARISON:  None. FINDINGS: Cardiac shadow is within normal limits. The lungs are well aerated bilaterally. Mild interstitial changes are seen without focal infiltrate. No acute bony abnormality is seen. IMPRESSION: Mild interstitial changes likely of a chronic nature. No acute abnormality seen. Electronically Signed   By: Inez Catalina M.D.    On: 12/25/2015 21:17   Dg Lumbar Spine Complete  12/25/2015  CLINICAL DATA:  Fall today with low back pain, initial encounter EXAM: LUMBAR SPINE - COMPLETE 4+ VIEW COMPARISON:  None. FINDINGS: Five lumbar type vertebral bodies are well visualized. A mild scoliosis concave to the right is noted. Reactive endplate changes are noted. No anterolisthesis is seen. No soft tissue abnormality is noted. IMPRESSION: Degenerative change without acute abnormality. Electronically Signed   By: Inez Catalina M.D.   On: 12/25/2015 21:17   Ct Angio Chest Pe W Or Wo Contrast  12/27/2015  CLINICAL DATA:  Shortness of breath. Atrial mass versus blood clot seen on cardiac echo. EXAM: CT ANGIOGRAPHY CHEST WITH CONTRAST TECHNIQUE: Multidetector CT imaging of the chest was performed using the standard protocol during bolus administration of intravenous contrast. Multiplanar CT image reconstructions and MIPs were obtained to evaluate the vascular anatomy. CONTRAST:  80 cc Isovue 370 intravenously. COMPARISON:  Chest radiograph 12/26/2015 FINDINGS: Mediastinum/Lymph Nodes: Left subclavian approach central venous catheter terminates in the superior vena cava. There are bilateral pulmonary emboli. There is a nearly completely occlusive thrombus within the left lower lobar pulmonary artery, with clot extending to the basilar segmental branches. Nonocclusive clot is seen within the left upper, lingular lobar pulmonary artery, right upper, right middle and right lower lobe pulmonary arteries. There is evidence of right heart strain with right/left ventricular ratio over 1. Irregular hypoattenuated area within the right atrium may represent mixing of  non-opacified blood from the inferior vena cava with contrast opacified blood, versus an intramural thrombus. No masses or pathologically enlarged lymph nodes identified. Lungs/Pleura: There are bilateral moderate in size pleural effusions. There is segmental atelectasis of the right lower lobe  and subsegmental atelectasis of the left lower lobe. Upper abdomen: Water density abdominal ascites. Musculoskeletal: No chest wall mass or suspicious bone lesions identified. Review of the MIP images confirms the above findings. IMPRESSION: Bilateral pulmonary emboli, nearly occlusive in the left lower lobe segmental branches, involving all of the segmental pulmonary arteries. Evidence of right heart strain with right/left heart ratio of over 1. Heterogeneous area of hypoattenuation within the right atrium may represent right atrial clot versus mixing of non-opacified blood from the inferior vena cava. Given presence of extensive bilateral pulmonary emboli, presence of atrial clot becomes more plausible. If clinically important, gated cardiac CT or cardiac MRI may be pursued. Bilateral pleural effusions and atelectatic changes of bilateral lung bases. Abdominal ascites. Critical Value/emergent results were called by telephone at the time of interpretation on 12/27/2015 at 7:06 pm to Dr. Halford Chessman, who verbally acknowledged these results. Electronically Signed   By: Fidela Salisbury M.D.   On: 12/27/2015 19:19   Ct Pelvis W Contrast  12/26/2015  CLINICAL DATA:  75 year old with large pelvic hematoma following left hip replacement. Evaluate for pelvic bleeding. EXAM: CT PELVIS WITH CONTRAST TECHNIQUE: Multidetector CT imaging of the pelvis was performed using the standard protocol following the bolus administration of intravenous contrast. CONTRAST:  100 mL Isovue COMPARISON:  None. FINDINGS: Vascular structures: The distal abdominal aorta and common iliac arteries are patent. There is active contrast extravasation in the left hemipelvis originating from the proximal left common femoral artery or distal left external iliac artery. Bleeding is originating near the origin of the left inferior epigastric artery but may be separate from this vessel. The left common femoral artery and the proximal left femoral arteries  are patent. The right iliac arteries and right femoral arteries are patent. The delayed images demonstrate additional contrast extravasation within the large hematoma within the anterior pelvis and lower abdomen. There is fluid tracking into the left abdomen. There is fluid and gas tracking up the left iliacus muscle. Expected gas around the left hip from the recent hip replacement. Fluid in the pelvis. Limited evaluation of pelvic structures due to the large amount of blood. The left hip arthroplasty is located. 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 IMPRESSION: Active bleeding in the pelvis. The bleeding is originating from the proximal left common femoral artery or the distal left external iliac artery. The bleeding is near the origin of the left inferior epigastric artery. Large amount of blood and hematoma formation throughout the abdomen and pelvis. Expected postsurgical changes from left hip arthroplasty. Electronically Signed   By: Markus Daft M.D.   On: 12/26/2015 18:55   Ir Angiogram Extremity Left  12/26/2015  INDICATION: 75 year old female with a history of a hip arthroplasty and hypotension. EXAM: IR EMBO ART VEN HEMORR LYMPH EXTRAV INC GUIDE ROADMAPPING; IR ULTRASOUND GUIDANCE VASC ACCESS RIGHT; ARTERIOGRAPHY; PELVIC SELECTIVE ARTERIOGRAPHY; LEFT EXTREMITY ARTERIOGRAPHY; ADDITIONAL ARTERIOGRAPHY MEDICATIONS: None ANESTHESIA/SEDATION: Patient was intubated. Anesthesia team managed the endotracheal airway CONTRAST:  50 cc Isovue FLUOROSCOPY TIME:  Fluoroscopy Time: 5 minutes 42 seconds COMPLICATIONS: None PROCEDURE: Informed consent was obtained from the patient following explanation of the procedure, risks, benefits and alternatives. The patient understands, agrees and consents for the procedure. All questions were addressed. A time out was performed  prior to the initiation of the procedure. Maximal barrier sterile technique utilized including caps, mask, sterile gowns, sterile gloves, large  sterile drape, hand hygiene, and Betadine prep. Patient positioned supine position on the fluoroscopy table. The right inguinal region was prepped and draped in the usual sterile fashion. Ultrasound survey of the right inguinal region was performed with images stored and sent to PACs. A micropuncture needle was used access the right common femoral artery under ultrasound. With excellent arterial blood flow returned, and an .018 micro wire was passed through the needle, observed enter the abdominal aorta under fluoroscopy. The needle was removed, and a micropuncture sheath was placed over the wire. The inner dilator and wire were removed, and an 035 Bentson wire was advanced under fluoroscopy into the abdominal aorta. The sheath was removed and a standard 5 [place] vascular sheath was placed. The dilator was removed and the sheath was flushed. Omni Flush catheter was used to navigate a Bentson wire over the aortic bifurcation into the left iliac system. Catheter was exchanged for a C2 catheter advanced into the hypogastric artery with an angiogram performed. Catheter was withdrawn and redirected into the external iliac artery. Angiogram was performed. A Rosen wire was advanced into the proximal femoral system and the catheter was withdrawn. Six French right tip sheath was advanced over the bifurcation into the left iliac system. 7 mm x 4 cm balloon was advanced over the Rosen wire into the proximal left external iliac artery. Balloon was gently inflated to low atmospheric pressure, with inflation of approximately 4 minutes. At this time, measurements of the left external iliac artery were performed. Balloon was deflated, the balloon catheter was advanced into the proximal left femoral system, and the 035 Rosen wire was exchanged for an 018 S-V8 wire. Balloon catheter was removed. A 53mm x 27mm Viabahn stent graft was select thinned and advanced over the 018 wire to the targeted the avulsed artery. Stent graft was  deployed. Balloon angioplasty to the nominal diameter of 6 mm was performed. Final angiogram was performed. The 6 French 35 cm right tip sheath was exchanged for a standard 10 cm 6 French sheath. Sheath was elected to remain given the patient's coagulopathy. Patient tolerated the procedure well with no significant blood loss. IMPRESSION: Status post left lower extremity angiogram with emergent covered stent embolization of distal external iliac artery branch vessel pseudoaneurysm/avulsion, with placement of stent graft. Signed, Dulcy Fanny. Earleen Newport, DO [facility] Radiology Electronically Signed   By: Corrie Mckusick D.O.   On: 12/26/2015 22:45   Ir Angiogram Pelvis Selective Or Supraselective  12/26/2015  INDICATION: 75 year old female with a history of a hip arthroplasty and hypotension. EXAM: IR EMBO ART VEN HEMORR LYMPH EXTRAV INC GUIDE ROADMAPPING; IR ULTRASOUND GUIDANCE VASC ACCESS RIGHT; ARTERIOGRAPHY; PELVIC SELECTIVE ARTERIOGRAPHY; LEFT EXTREMITY ARTERIOGRAPHY; ADDITIONAL ARTERIOGRAPHY MEDICATIONS: None ANESTHESIA/SEDATION: Patient was intubated. Anesthesia team managed the endotracheal airway CONTRAST:  50 cc Isovue FLUOROSCOPY TIME:  Fluoroscopy Time: 5 minutes 42 seconds COMPLICATIONS: None PROCEDURE: Informed consent was obtained from the patient following explanation of the procedure, risks, benefits and alternatives. The patient understands, agrees and consents for the procedure. All questions were addressed. A time out was performed prior to the initiation of the procedure. Maximal barrier sterile technique utilized including caps, mask, sterile gowns, sterile gloves, large sterile drape, hand hygiene, and Betadine prep. Patient positioned supine position on the fluoroscopy table. The right inguinal region was prepped and draped in the usual sterile fashion. Ultrasound  survey of the right inguinal region was performed with images stored and sent to PACs. A  micropuncture needle was used access the right common femoral artery under ultrasound. With excellent arterial blood flow returned, and an .018 micro wire was passed through the needle, observed enter the abdominal aorta under fluoroscopy. The needle was removed, and a micropuncture sheath was placed over the wire. The inner dilator and wire were removed, and an 035 Bentson wire was advanced under fluoroscopy into the abdominal aorta. The sheath was removed and a standard 5 [place] vascular sheath was placed. The dilator was removed and the sheath was flushed. Omni Flush catheter was used to navigate a Bentson wire over the aortic bifurcation into the left iliac system. Catheter was exchanged for a C2 catheter advanced into the hypogastric artery with an angiogram performed. Catheter was withdrawn and redirected into the external iliac artery. Angiogram was performed. A Rosen wire was advanced into the proximal femoral system and the catheter was withdrawn. Six French right tip sheath was advanced over the bifurcation into the left iliac system. 7 mm x 4 cm balloon was advanced over the Rosen wire into the proximal left external iliac artery. Balloon was gently inflated to low atmospheric pressure, with inflation of approximately 4 minutes. At this time, measurements of the left external iliac artery were performed. Balloon was deflated, the balloon catheter was advanced into the proximal left femoral system, and the 035 Rosen wire was exchanged for an 018 S-V8 wire. Balloon catheter was removed. A 49mm x 86mm Viabahn stent graft was select thinned and advanced over the 018 wire to the targeted the avulsed artery. Stent graft was deployed. Balloon angioplasty to the nominal diameter of 6 mm was performed. Final angiogram was performed. The 6 French 35 cm right tip sheath was exchanged for a standard 10 cm 6 French sheath. Sheath was elected to remain given the patient's coagulopathy. Patient tolerated the procedure  well with no significant blood loss. IMPRESSION: Status post left lower extremity angiogram with emergent covered stent embolization of distal external iliac artery branch vessel pseudoaneurysm/avulsion, with placement of stent graft. Signed, Dulcy Fanny. Earleen Newport, DO [facility] Radiology Electronically Signed   By: Corrie Mckusick D.O.   On: 12/26/2015 22:45   Ir Angiogram Selective Each Additional Vessel  12/26/2015  INDICATION: 75 year old female with a history of a hip arthroplasty and hypotension. EXAM: IR EMBO ART VEN HEMORR LYMPH EXTRAV INC GUIDE ROADMAPPING; IR ULTRASOUND GUIDANCE VASC ACCESS RIGHT; ARTERIOGRAPHY; PELVIC SELECTIVE ARTERIOGRAPHY; LEFT EXTREMITY ARTERIOGRAPHY; ADDITIONAL ARTERIOGRAPHY MEDICATIONS: None ANESTHESIA/SEDATION: Patient was intubated. Anesthesia team managed the endotracheal airway CONTRAST:  50 cc Isovue FLUOROSCOPY TIME:  Fluoroscopy Time: 5 minutes 42 seconds COMPLICATIONS: None PROCEDURE: Informed consent was obtained from the patient following explanation of the procedure, risks, benefits and alternatives. The patient understands, agrees and consents for the procedure. All questions were addressed. A time out was performed prior to the initiation of the procedure. Maximal barrier sterile technique utilized including caps, mask, sterile gowns, sterile gloves, large sterile drape, hand hygiene, and Betadine prep. Patient positioned supine position on the fluoroscopy table. The right inguinal region was prepped and draped in the usual sterile fashion. Ultrasound survey of the right inguinal region was performed with images stored and sent to PACs. A micropuncture needle was used access the right common femoral artery under ultrasound. With excellent arterial blood flow returned, and an .018 micro wire was passed through the needle, observed enter the abdominal aorta  under fluoroscopy. The needle was removed, and a micropuncture sheath  was placed over the wire. The inner dilator and wire were removed, and an 035 Bentson wire was advanced under fluoroscopy into the abdominal aorta. The sheath was removed and a standard 5 [place] vascular sheath was placed. The dilator was removed and the sheath was flushed. Omni Flush catheter was used to navigate a Bentson wire over the aortic bifurcation into the left iliac system. Catheter was exchanged for a C2 catheter advanced into the hypogastric artery with an angiogram performed. Catheter was withdrawn and redirected into the external iliac artery. Angiogram was performed. A Rosen wire was advanced into the proximal femoral system and the catheter was withdrawn. Six French right tip sheath was advanced over the bifurcation into the left iliac system. 7 mm x 4 cm balloon was advanced over the Rosen wire into the proximal left external iliac artery. Balloon was gently inflated to low atmospheric pressure, with inflation of approximately 4 minutes. At this time, measurements of the left external iliac artery were performed. Balloon was deflated, the balloon catheter was advanced into the proximal left femoral system, and the 035 Rosen wire was exchanged for an 018 S-V8 wire. Balloon catheter was removed. A 73mm x 51mm Viabahn stent graft was select thinned and advanced over the 018 wire to the targeted the avulsed artery. Stent graft was deployed. Balloon angioplasty to the nominal diameter of 6 mm was performed. Final angiogram was performed. The 6 French 35 cm right tip sheath was exchanged for a standard 10 cm 6 French sheath. Sheath was elected to remain given the patient's coagulopathy. Patient tolerated the procedure well with no significant blood loss. IMPRESSION: Status post left lower extremity angiogram with emergent covered stent embolization of distal external iliac artery branch vessel pseudoaneurysm/avulsion, with placement of stent graft. Signed, Dulcy Fanny. Earleen Newport, DO [facility] Radiology Electronically Signed   By: Corrie Mckusick D.O.   On: 12/26/2015 22:45   Ir Angiogram Follow Up Study  12/26/2015  INDICATION: 75 year old female with a history of a hip arthroplasty and hypotension. EXAM: IR EMBO ART VEN HEMORR LYMPH EXTRAV INC GUIDE ROADMAPPING; IR ULTRASOUND GUIDANCE VASC ACCESS RIGHT; ARTERIOGRAPHY; PELVIC SELECTIVE ARTERIOGRAPHY; LEFT EXTREMITY ARTERIOGRAPHY; ADDITIONAL ARTERIOGRAPHY MEDICATIONS: None ANESTHESIA/SEDATION: Patient was intubated. Anesthesia team managed the endotracheal airway CONTRAST:  50 cc Isovue FLUOROSCOPY TIME:  Fluoroscopy Time: 5 minutes 42 seconds COMPLICATIONS: None PROCEDURE: Informed consent was obtained from the patient following explanation of the procedure, risks, benefits and alternatives. The patient understands, agrees and consents for the procedure. All questions were addressed. A time out was performed prior to the initiation of the procedure. Maximal barrier sterile technique utilized including caps, mask, sterile gowns, sterile gloves, large sterile drape, hand hygiene, and Betadine prep. Patient positioned supine position on the fluoroscopy table. The right inguinal region was prepped and draped in the usual sterile fashion. Ultrasound survey of the right inguinal region was performed with images stored and sent to PACs. A micropuncture needle was used access the right common femoral artery under ultrasound. With excellent arterial blood flow returned, and an .018 micro wire was passed through the needle, observed enter the abdominal aorta under fluoroscopy. The needle was removed, and a micropuncture sheath was placed over the wire. The inner dilator and wire were removed, and an 035 Bentson wire was advanced under fluoroscopy into the abdominal aorta. The sheath was removed and a standard 5 [place] vascular sheath was placed. The dilator  was removed and the sheath was flushed. Omni Flush catheter was used to  navigate a Bentson wire over the aortic bifurcation into the left iliac system. Catheter was exchanged for a C2 catheter advanced into the hypogastric artery with an angiogram performed. Catheter was withdrawn and redirected into the external iliac artery. Angiogram was performed. A Rosen wire was advanced into the proximal femoral system and the catheter was withdrawn. Six French right tip sheath was advanced over the bifurcation into the left iliac system. 7 mm x 4 cm balloon was advanced over the Rosen wire into the proximal left external iliac artery. Balloon was gently inflated to low atmospheric pressure, with inflation of approximately 4 minutes. At this time, measurements of the left external iliac artery were performed. Balloon was deflated, the balloon catheter was advanced into the proximal left femoral system, and the 035 Rosen wire was exchanged for an 018 S-V8 wire. Balloon catheter was removed. A 53mm x 17mm Viabahn stent graft was select thinned and advanced over the 018 wire to the targeted the avulsed artery. Stent graft was deployed. Balloon angioplasty to the nominal diameter of 6 mm was performed. Final angiogram was performed. The 6 French 35 cm right tip sheath was exchanged for a standard 10 cm 6 French sheath. Sheath was elected to remain given the patient's coagulopathy. Patient tolerated the procedure well with no significant blood loss. IMPRESSION: Status post left lower extremity angiogram with emergent covered stent embolization of distal external iliac artery branch vessel pseudoaneurysm/avulsion, with placement of stent graft. Signed, Dulcy Fanny. Earleen Newport, DO [facility] Radiology Electronically Signed   By: Corrie Mckusick D.O.   On: 12/26/2015 22:45   Ir US Guide Vasc Access Right  12/26/2015  INDICATION: 75 year old female with a history of a hip arthroplasty and hypotension. EXAM: IR EMBO ART VEN HEMORR LYMPH EXTRAV INC GUIDE  ROADMAPPING; IR ULTRASOUND GUIDANCE VASC ACCESS RIGHT; ARTERIOGRAPHY; PELVIC SELECTIVE ARTERIOGRAPHY; LEFT EXTREMITY ARTERIOGRAPHY; ADDITIONAL ARTERIOGRAPHY MEDICATIONS: None ANESTHESIA/SEDATION: Patient was intubated. Anesthesia team managed the endotracheal airway CONTRAST:  50 cc Isovue FLUOROSCOPY TIME:  Fluoroscopy Time: 5 minutes 42 seconds COMPLICATIONS: None PROCEDURE: Informed consent was obtained from the patient following explanation of the procedure, risks, benefits and alternatives. The patient understands, agrees and consents for the procedure. All questions were addressed. A time out was performed prior to the initiation of the procedure. Maximal barrier sterile technique utilized including caps, mask, sterile gowns, sterile gloves, large sterile drape, hand hygiene, and Betadine prep. Patient positioned supine position on the fluoroscopy table. The right inguinal region was prepped and draped in the usual sterile fashion. Ultrasound survey of the right inguinal region was performed with images stored and sent to PACs. A micropuncture needle was used access the right common femoral artery under ultrasound. With excellent arterial blood flow returned, and an .018 micro wire was passed through the needle, observed enter the abdominal aorta under fluoroscopy. The needle was removed, and a micropuncture sheath was placed over the wire. The inner dilator and wire were removed, and an 035 Bentson wire was advanced under fluoroscopy into the abdominal aorta. The sheath was removed and a standard 5 [place] vascular sheath was placed. The dilator was removed and the sheath was flushed. Omni Flush catheter was used to navigate a Bentson wire over the aortic bifurcation into the left iliac system. Catheter was exchanged for a C2 catheter advanced into the hypogastric artery with an angiogram performed. Catheter was withdrawn and redirected into the  external iliac artery. Angiogram was performed. A Rosen wire was  advanced into the proximal femoral system and the catheter was withdrawn. Six French right tip sheath was advanced over the bifurcation into the left iliac system. 7 mm x 4 cm balloon was advanced over the Rosen wire into the proximal left external iliac artery. Balloon was gently inflated to low atmospheric pressure, with inflation of approximately 4 minutes. At this time, measurements of the left external iliac artery were performed. Balloon was deflated, the balloon catheter was advanced into the proximal left femoral system, and the 035 Rosen wire was exchanged for an 018 S-V8 wire. Balloon catheter was removed. A 81mm x 77mm Viabahn stent graft was select thinned and advanced over the 018 wire to the targeted the avulsed artery. Stent graft was deployed. Balloon angioplasty to the nominal diameter of 6 mm was performed. Final angiogram was performed. The 6 French 35 cm right tip sheath was exchanged for a standard 10 cm 6 French sheath. Sheath was elected to remain given the patient's coagulopathy. Patient tolerated the procedure well with no significant blood loss. IMPRESSION: Status post left lower extremity angiogram with emergent covered stent embolization of distal external iliac artery branch vessel pseudoaneurysm/avulsion, with placement of stent graft. Signed, Dulcy Fanny. Earleen Newport, DO [facility] Radiology Electronically Signed   By: Corrie Mckusick D.O.   On: 12/26/2015 22:45   Dg Chest Port 1 View  12/26/2015  CLINICAL DATA:  Patient status post ET tube placement. EXAM: PORTABLE CHEST 1 VIEW COMPARISON:  Chest radiograph 12/25/2015. FINDINGS: ET tube terminates in the mid trachea. Left subclavian central venous catheter tip projects over the superior vena cava. Enteric tube tip and side port terminates in the midesophagus, recommend advancement. Stable cardiac and mediastinal contours. Low lung volumes. Elevation of the right hemidiaphragm.  Right-greater-than-left interstitial pulmonary opacities. No large pleural effusion or pneumothorax. IMPRESSION: ET tube tip and side port terminate in the midesophagus, recommend advancement. Bilateral, left-greater-than-right, interstitial opacities which may represent pulmonary edema. These results will be called to the ordering clinician or representative by the Radiologist Assistant, and communication documented in the PACS or zVision Dashboard. Electronically Signed   By: Lovey Newcomer M.D.   On: 12/26/2015 21:07   Dg Hip Operative Unilat W Or W/o Pelvis Left  12/26/2015  CLINICAL DATA:  Left anterior hip replacement due to fracture. EXAM: OPERATIVE LEFT HIP (WITH PELVIS IF PERFORMED) 1 VIEW TECHNIQUE: Fluoroscopic spot image(s) were submitted for interpretation post-operatively. COMPARISON:  12/25/2015 FINDINGS: A total left hip arthroplasty has been performed. Alignment of the prosthesis is grossly normal on this single view. No evidence for a periprosthetic fracture. IMPRESSION: Left hip replacement without complicating features. Electronically Signed   By: Markus Daft M.D.   On: 12/26/2015 16:34   Dg Hip Unilat With Pelvis 2-3 Views Left  12/25/2015  CLINICAL DATA:  Recent fall with hip pain, initial encounter EXAM: DG HIP (WITH OR WITHOUT PELVIS) 2-3V LEFT COMPARISON:  None. FINDINGS: There is left femoral neck fracture with impaction and angulation at the fracture site. Pelvic ring is intact. No other focal abnormality is seen. IMPRESSION: Left femoral neck fracture. Electronically Signed   By: Inez Catalina M.D.   On: 12/25/2015 21:16   Dg Femur 1v Left  12/25/2015  CLINICAL DATA:  [place] with left hip pain. EXAM: LEFT FEMUR 1 VIEW COMPARISON:  Left hip 12/25/2015 FINDINGS: Single view of the left femur was obtained. There is a fracture involving the  proximal left femur at the junction of the femoral head and neck. There is superior displacement of the left femoral neck. Findings are suggestive for a  subcapital hip fracture. The mid and distal femur appear to be intact on this single view. IMPRESSION: Fracture of the proximal left femur. Findings are compatible with a subcapital hip fracture. Electronically Signed   By: Markus Daft M.D.   On: 12/25/2015 21:17   [place] Guide Roadmapping  12/26/2015  INDICATION: 75 year old female with a history of a hip arthroplasty and hypotension. EXAM: IR EMBO ART VEN HEMORR LYMPH EXTRAV INC GUIDE ROADMAPPING; IR ULTRASOUND GUIDANCE VASC ACCESS RIGHT; ARTERIOGRAPHY; PELVIC SELECTIVE ARTERIOGRAPHY; LEFT EXTREMITY ARTERIOGRAPHY; ADDITIONAL ARTERIOGRAPHY MEDICATIONS: None ANESTHESIA/SEDATION: Patient was intubated. Anesthesia team managed the endotracheal airway CONTRAST:  50 cc Isovue FLUOROSCOPY TIME:  Fluoroscopy Time: 5 minutes 42 seconds COMPLICATIONS: None PROCEDURE: Informed consent was obtained from the patient following explanation of the procedure, risks, benefits and alternatives. The patient understands, agrees and consents for the procedure. All questions were addressed. A time out was performed prior to the initiation of the procedure. Maximal barrier sterile technique utilized including caps, mask, sterile gowns, sterile gloves, large sterile drape, hand hygiene, and Betadine prep. Patient positioned supine position on the fluoroscopy table. The right inguinal region was prepped and draped in the usual sterile fashion. Ultrasound survey of the right inguinal region was performed with images stored and sent to PACs. A micropuncture needle was used access the right common femoral artery under ultrasound. With excellent arterial blood flow returned, and an .018 micro wire was passed through the needle, observed enter the abdominal aorta under fluoroscopy. The needle was removed, and a micropuncture sheath was placed over the wire. The inner dilator and wire were removed, and an 035 Bentson wire was advanced under fluoroscopy into  the abdominal aorta. The sheath was removed and a standard 5 [place] vascular sheath was placed. The dilator was removed and the sheath was flushed. Omni Flush catheter was used to navigate a Bentson wire over the aortic bifurcation into the left iliac system. Catheter was exchanged for a C2 catheter advanced into the hypogastric artery with an angiogram performed. Catheter was withdrawn and redirected into the external iliac artery. Angiogram was performed. A Rosen wire was advanced into the proximal femoral system and the catheter was withdrawn. Six French right tip sheath was advanced over the bifurcation into the left iliac system. 7 mm x 4 cm balloon was advanced over the Rosen wire into the proximal left external iliac artery. Balloon was gently inflated to low atmospheric pressure, with inflation of approximately 4 minutes. At this time, measurements of the left external iliac artery were performed. Balloon was deflated, the balloon catheter was advanced into the proximal left femoral system, and the 035 Rosen wire was exchanged for an 018 S-V8 wire. Balloon catheter was removed. A 4mm x 34mm Viabahn stent graft was select thinned and advanced over the 018 wire to the targeted the avulsed artery. Stent graft was deployed. Balloon angioplasty to the nominal diameter of 6 mm was performed. Final angiogram was performed. The 6 French 35 cm right tip sheath was exchanged for a standard 10 cm 6 French sheath. Sheath was elected to remain given the patient's coagulopathy. Patient tolerated the procedure well with no significant blood loss. IMPRESSION: Status post left lower extremity angiogram with emergent covered stent embolization of distal external iliac artery branch vessel pseudoaneurysm/avulsion, with placement of  stent graft. Signed, Dulcy Fanny. Earleen Newport, DO [facility] Radiology Electronically Signed   By: Corrie Mckusick D.O.   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 Normal flow to both lower extremities with no evidence of ongoing pelvic bleeding. Following with you.   LOS: 3 days   Bonnie Salazar 12/28/2015, 8:07 AM

## 2015-12-28 NOTE — Progress Notes (Signed)
[place] Progress Note Patient Name: CHARDONNAE LINCE DOB: [birth year] MRN: [id number]   Date of Service  12/28/2015  HPI/Events of Note  Patient had received several 0.5 doses of xanax which had been scheduled.  Call from bedside nurse reporting patient continued to be anxious, on side of bed, alert, and hypertensive.  Received 1 mg of ativan IV.  Nurse then calling reporting patient not responsive.  GCS of 6.  On BiPAP with worsening hypercarbia but preserved pH.  eICU Interventions  Plan: Benzo reversal with 0.2 mg of romazicon. Monitor patient closely     Intervention Category Major Interventions: Other:  Dakota Stangl 12/28/2015, 4:17 AM

## 2015-12-28 NOTE — Procedures (Signed)
Interventional Radiology Procedure Note  Procedure: US guided access of right CFV for placement of infusion catheter into the IVC, terminating at the lower right atrium.  20cm length unifuse catheter placed.  (90cm working length).   Findings: Right LE venogram from the right CFV sheath. Slow flow through the right iliac and IVC.  No occlusive right iliac thrombus.   Complications: None  EBL: 0cc  Recommendations:  - Unifuse catheter will run tPA solution @2mg /hour continuous.  Endpoints for stopping the infusion:  Drop in fibrinogen <100, bleeding, resolution of the right atrial thrombus. - Continue low dose heparin, managed by pharmacy.  Thank you - Q6 hour fibrinogen, H&H, heparin levels  - Call IR with a fibrinogen <150 for adjustment.  - Repeat ECHO in am is suggested.  - Saline infusion through the sidearm of the sheath, 20cc/hour - Call with questions/concerns  Pager [phone]   Signed,  Dulcy Fanny. Earleen Newport, DO

## 2015-12-28 NOTE — Progress Notes (Signed)
Called emergently bedside, patient started having leg pain then back pain then abdominal pain.  Dr. Melvyn Novas spoke with vascular they said little can be done on their end.  I spoke with IR, Dr. Lysle Dingwall, he recommended stabilizing the patient then to CT scan for an angiogram and he will meet her there.  Spoke with patient and husband, agreeable to intubation and stabilization then moving down to scanner.  Patient's BP started dropping, will transfuse 2 units pRBC and start neo.  Neo was started and patient was sedated and intubated.  VS appear stable for now, move down to the scanner.  The patient is critically ill with multiple organ systems failure and requires high complexity decision making for assessment and support, frequent evaluation and titration of therapies, application of advanced monitoring technologies and extensive interpretation of multiple databases.   Critical Care Time devoted to patient care services described in this note is  35  Minutes. This time reflects time of care of this signee Dr [name]. This critical care time does not reflect procedure time, or teaching time or supervisory time of PA/NP/Med student/Med Resident etc but could involve care discussion time.  Rush Farmer, M.D. [facility] Pulmonary/Critical Care Medicine. Pager: [phone]. After hours pager: [phone].

## 2015-12-28 NOTE — Clinical Social Work Note (Signed)
Clinical Social Work Assessment  Patient Details  Name: Bonnie Salazar MRN: [id number] Date of Birth: [birth year]  Date of referral:  12/28/15               Reason for consult:  Discharge Planning                Permission sought to share information with:  Case Manager, Facility Sport and exercise psychologist, Family Supports Permission granted to share information::  Yes, Verbal Permission Granted  Name::        Agency::   (SNF)  Relationship::     Contact Information:     Housing/Transportation Living arrangements for the past 2 months:  Single Family Home Source of Information:  Patient, Medical Team Patient Interpreter Needed:  None Criminal Activity/Legal Involvement Pertinent to Current Situation/Hospitalization:  No - Comment as needed Significant Relationships:  Spouse Lives with:  Spouse Do you feel safe going back to the place where you live?  No Need for family participation in patient care:  Yes (Comment)  Care giving concerns: Pt inquiring about how long length of stay may be at SNF. Pt open to SNF for higher level of care.    Social Worker assessment / plan: Holiday representative met with pt at bedside and discussed CSW role with discharge planning. Pt shares she was admitted from home. Pt agreeable to SNF if needed. Pt given SNF list.   Employment status:  Retired Forensic scientist:  Medicare PT Recommendations:  [place] / Referral to community resources:  [place]  Patient/Family's Response to care: Pt pleasant and understands reason for admission into hospital. Pt appears happy with care she is receiving at [facility].   Patient/Family's Understanding of and Emotional Response to Diagnosis, Current Treatment, and Prognosis: Pt appears to have a good understanding for reason for pt admission into hospital and pt care plan.   Emotional Assessment Appearance:  Appears stated age Attitude/Demeanor/Rapport:    (SNF) Affect (typically observed):  Anxious, Hopeful, Accepting Orientation:  Oriented to Self, Oriented to Place, Oriented to  Time, Oriented to Situation Alcohol / Substance use:  Not Applicable Psych involvement (Current and /or in the community):  No (Comment)  Discharge Needs  Concerns to be addressed:  Discharge Planning Concerns Readmission within the last 30 days:  No Current discharge risk:  None Barriers to Discharge:  Continued Medical Work up   Junie Spencer, LCSW 12/28/2015, 4:32 PM

## 2015-12-28 NOTE — Progress Notes (Signed)
Interventional Radiology Progress Note  75 year old female SP left hip arthroplasty with post-operative hemorrhage of left external iliac branch vessel, embolized with covered stent to the left external iliac artery.   No signs of ongoing hemorrhage, with serial H&H showing Hgb in the 10-11 range.   Patient has had worsening respiratory status over the past 24-36 hours, and ECHO performed Saturday demonstrated right atrial mass, presumed thrombus.  This ECHO was negative for ventricular dysfunction, specifically with normal right ventricle function.   A subsequent CT pulmonary angiogram was performed Saturday, which could not confirm a right atrial mass, but did show bilateral PE.  The volume of PE does not involve the central or lobar branches.  The PE are mostly subsegmental.    A slight increase in the RV/LV ratio was measured, which is discordant from the preceding ECHO.   Patient has had episodes of decreased oxygenation, with O2 sats as low as 90, with increasing requirements.  She has not had documented hypotension.   This am, patient has 02 mask, with tachypnea.  O2 is 93-97.  Current SBP is 158.  HR is 89-104.   Troponin I has trended positive, with first negative on Friday at 5:40pm, and the last 2.06 Saturday at 3:26pm.  Physical Exam: Palpable bilateral pedal pulses, with warm extremities.  Puncture site at the right hip is in good repair.  No hematoma or bruising.   No asymmetric swelling of the bilateral lower extremity.   Assessment:  SP left external iliac covered stent for embolization of pseudoaneurysm/branch avulsion.  No evidence of ongoing bleeding.   Respiratory status has deteriorated. ECHO performed Saturday documented right atrial mass, (presumed thrombus), but was not confirmed on CT.  It is possible this represented thrombus in-transit, or perhaps the mass is not visualized on the non-gated CT given the mixing of un-opacified blood.   The ECHO did document normal  right ventricular function, which is discordant with the CT findings, which measured an increased RV/LV ratio.  I believe ECHO would be more specific for right ventricle dysfunction, and it is possible that RV function may have changed between the 2 studies.  A repeat ECHO may be useful, which I discussed with the CCM team this am.    Will follow.   Call with questions/concerns. Pager: [phone]  Signed,  Dulcy Fanny. Earleen Newport, DO

## 2015-12-28 NOTE — Procedures (Addendum)
CPR Note  PEA arrest, 10 minutes resuscitation effort via manual CPR, see code sheet for details and duration.  Rush Farmer, M.D. [facility] Pulmonary/Critical Care Medicine. Pager: [phone]. After hours pager: [phone].

## 2015-12-28 NOTE — Progress Notes (Signed)
2 Days Post-Op  Subjective: Patient extubated, awake, alert On NRB - some shortness of breath Large bilateral PE - with clot in RA.   Abdomen - no complaints  Objective: Vital signs in last 24 hours: Temp:  [97.9 F (36.6 C)-100.2 F (37.9 C)] 97.9 F (36.6 C) (06/18 0741) Pulse Rate:  [40-128] 98 (06/18 0900) Resp:  [19-33] 33 (06/18 0900) BP: (109-172)/(69-130) 155/119 mmHg (06/18 0900) SpO2:  [90 %-97 %] 97 % (06/18 0900) FiO2 (%):  [100 %] 100 % (06/18 0800) Last BM Date: 12/24/15  Intake/Output from previous day: 06/17 0701 - 06/18 0700 In: 2040.8 [I.V.:1990.8; IV Piggyback:50] Out: 950 [Urine:950] Intake/Output this shift: Total I/O In: 227 [I.V.:177; IV Piggyback:50] Out: 115 [Urine:115]  General appearance: alert, cooperative and mildly short of breath GI: mildy distended; less firm; non-tender Extremities - warm; normal sensation in feet  Lab Results:   Recent Labs  12/27/15  12/27/15 0743 12/27/15 1516 12/27/15 2145  WBC 8.3  --  7.2  --   --   HGB 11.6*  < > 10.7* 10.5* 10.2*  HCT 34.3*  < > 30.7* 30.5* 30.2*  PLT 75*  --  61*  --   --   < > = values in this interval not displayed. BMET  Recent Labs  12/27/15 0400 12/28/15 0038  NA 140 140  K 3.6 3.8  CL 110 110  CO2 22 23  GLUCOSE 145* 133*  BUN 14 15  CREATININE 0.78 0.84  CALCIUM 8.4* 8.2*   PT/INR  Recent Labs  12/27/15 12/27/15 0400  LABPROT 16.8* 17.0*  INR 1.35 1.37   ABG  Recent Labs  12/26/15 1720 12/26/15 1855  PHART 7.344* 7.268*  HCO3 19.9* 17.6*    Studies/Results: Ct Angio Chest Pe W Or Wo Contrast  12/27/2015  CLINICAL DATA:  Shortness of breath. Atrial mass versus blood clot seen on cardiac echo. EXAM: CT ANGIOGRAPHY CHEST WITH CONTRAST TECHNIQUE: Multidetector CT imaging of the chest was performed using the standard protocol during bolus administration of intravenous contrast. Multiplanar CT image reconstructions and MIPs were obtained to evaluate the vascular  anatomy. CONTRAST:  80 cc Isovue 370 intravenously. COMPARISON:  Chest radiograph 12/26/2015 FINDINGS: Mediastinum/Lymph Nodes: Left subclavian approach central venous catheter terminates in the superior vena cava. There are bilateral pulmonary emboli. There is a nearly completely occlusive thrombus within the left lower lobar pulmonary artery, with clot extending to the basilar segmental branches. Nonocclusive clot is seen within the left upper, lingular lobar pulmonary artery, right upper, right middle and right lower lobe pulmonary arteries. There is evidence of right heart strain with right/left ventricular ratio over 1. Irregular hypoattenuated area within the right atrium may represent mixing of non-opacified blood from the inferior vena cava with contrast opacified blood, versus an intramural thrombus. No masses or pathologically enlarged lymph nodes identified. Lungs/Pleura: There are bilateral moderate in size pleural effusions. There is segmental atelectasis of the right lower lobe and subsegmental atelectasis of the left lower lobe. Upper abdomen: Water density abdominal ascites. Musculoskeletal: No chest wall mass or suspicious bone lesions identified. Review of the MIP images confirms the above findings. IMPRESSION: Bilateral pulmonary emboli, nearly occlusive in the left lower lobe segmental branches, involving all of the segmental pulmonary arteries. Evidence of right heart strain with right/left heart ratio of over 1. Heterogeneous area of hypoattenuation within the right atrium may represent right atrial clot versus mixing of non-opacified blood from the inferior vena cava. Given presence of extensive bilateral pulmonary  emboli, presence of atrial clot becomes more plausible. If clinically important, gated cardiac CT or cardiac MRI may be pursued. Bilateral pleural effusions and atelectatic changes of bilateral lung bases. Abdominal ascites. Critical Value/emergent results were called by telephone  at the time of interpretation on 12/27/2015 at 7:06 pm to Dr. Halford Chessman, who verbally acknowledged these results. Electronically Signed   By: Fidela Salisbury M.D.   On: 12/27/2015 19:19   Ct Pelvis W Contrast  12/26/2015  CLINICAL DATA:  75 year old with large pelvic hematoma following left hip replacement. Evaluate for pelvic bleeding. EXAM: CT PELVIS WITH CONTRAST TECHNIQUE: Multidetector CT imaging of the pelvis was performed using the standard protocol following the bolus administration of intravenous contrast. CONTRAST:  100 mL Isovue COMPARISON:  None. FINDINGS: Vascular structures: The distal abdominal aorta and common iliac arteries are patent. There is active contrast extravasation in the left hemipelvis originating from the proximal left common femoral artery or distal left external iliac artery. Bleeding is originating near the origin of the left inferior epigastric artery but may be separate from this vessel. The left common femoral artery and the proximal left femoral arteries are patent. The right iliac arteries and right femoral arteries are patent. The delayed images demonstrate additional contrast extravasation within the large hematoma within the anterior pelvis and lower abdomen. There is fluid tracking into the left abdomen. There is fluid and gas tracking up the left iliacus muscle. Expected gas around the left hip from the recent hip replacement. Fluid in the pelvis. Limited evaluation of pelvic structures due to the large amount of blood. The left hip arthroplasty is located. 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 IMPRESSION: Active bleeding in the pelvis. The bleeding is originating from the proximal left common femoral artery or the distal left external iliac artery. The bleeding is near the origin of the left inferior epigastric artery. Large amount of blood and hematoma formation throughout the abdomen and pelvis. Expected postsurgical changes from left hip arthroplasty.  Electronically Signed   By: Markus Daft M.D.   On: 12/26/2015 18:55   Ir Angiogram Extremity Left  12/26/2015  INDICATION: 75 year old female with a history of a hip arthroplasty and hypotension. EXAM: IR EMBO ART VEN HEMORR LYMPH EXTRAV INC GUIDE ROADMAPPING; IR ULTRASOUND GUIDANCE VASC ACCESS RIGHT; ARTERIOGRAPHY; PELVIC SELECTIVE ARTERIOGRAPHY; LEFT EXTREMITY ARTERIOGRAPHY; ADDITIONAL ARTERIOGRAPHY MEDICATIONS: None ANESTHESIA/SEDATION: Patient was intubated. Anesthesia team managed the endotracheal airway CONTRAST:  50 cc Isovue FLUOROSCOPY TIME:  Fluoroscopy Time: 5 minutes 42 seconds COMPLICATIONS: None PROCEDURE: Informed consent was obtained from the patient following explanation of the procedure, risks, benefits and alternatives. The patient understands, agrees and consents for the procedure. All questions were addressed. A time out was performed prior to the initiation of the procedure. Maximal barrier sterile technique utilized including caps, mask, sterile gowns, sterile gloves, large sterile drape, hand hygiene, and Betadine prep. Patient positioned supine position on the fluoroscopy table. The right inguinal region was prepped and draped in the usual sterile fashion. Ultrasound survey of the right inguinal region was performed with images stored and sent to PACs. A micropuncture needle was used access the right common femoral artery under ultrasound. With excellent arterial blood flow returned, and an .018 micro wire was passed through the needle, observed enter the abdominal aorta under fluoroscopy. The needle was removed, and a micropuncture sheath was placed over the wire. The inner dilator and wire were removed, and an 035 Bentson wire was advanced under fluoroscopy into the abdominal aorta. The sheath  was removed and a standard 5 [place] vascular sheath was placed. The dilator was removed and the sheath was flushed. Omni Flush catheter was used to navigate a Bentson wire over the aortic  bifurcation into the left iliac system. Catheter was exchanged for a C2 catheter advanced into the hypogastric artery with an angiogram performed. Catheter was withdrawn and redirected into the external iliac artery. Angiogram was performed. A Rosen wire was advanced into the proximal femoral system and the catheter was withdrawn. Six French right tip sheath was advanced over the bifurcation into the left iliac system. 7 mm x 4 cm balloon was advanced over the Rosen wire into the proximal left external iliac artery. Balloon was gently inflated to low atmospheric pressure, with inflation of approximately 4 minutes. At this time, measurements of the left external iliac artery were performed. Balloon was deflated, the balloon catheter was advanced into the proximal left femoral system, and the 035 Rosen wire was exchanged for an 018 S-V8 wire. Balloon catheter was removed. A 17mm x 73mm Viabahn stent graft was select thinned and advanced over the 018 wire to the targeted the avulsed artery. Stent graft was deployed. Balloon angioplasty to the nominal diameter of 6 mm was performed. Final angiogram was performed. The 6 French 35 cm right tip sheath was exchanged for a standard 10 cm 6 French sheath. Sheath was elected to remain given the patient's coagulopathy. Patient tolerated the procedure well with no significant blood loss. IMPRESSION: Status post left lower extremity angiogram with emergent covered stent embolization of distal external iliac artery branch vessel pseudoaneurysm/avulsion, with placement of stent graft. Signed, Dulcy Fanny. Earleen Newport, DO [facility] Radiology Electronically Signed   By: Corrie Mckusick D.O.   On: 12/26/2015 22:45   Ir Angiogram Pelvis Selective Or Supraselective  12/26/2015  INDICATION: 75 year old female with a history of a hip arthroplasty and hypotension. EXAM: IR EMBO ART VEN HEMORR LYMPH EXTRAV INC GUIDE ROADMAPPING; IR ULTRASOUND  GUIDANCE VASC ACCESS RIGHT; ARTERIOGRAPHY; PELVIC SELECTIVE ARTERIOGRAPHY; LEFT EXTREMITY ARTERIOGRAPHY; ADDITIONAL ARTERIOGRAPHY MEDICATIONS: None ANESTHESIA/SEDATION: Patient was intubated. Anesthesia team managed the endotracheal airway CONTRAST:  50 cc Isovue FLUOROSCOPY TIME:  Fluoroscopy Time: 5 minutes 42 seconds COMPLICATIONS: None PROCEDURE: Informed consent was obtained from the patient following explanation of the procedure, risks, benefits and alternatives. The patient understands, agrees and consents for the procedure. All questions were addressed. A time out was performed prior to the initiation of the procedure. Maximal barrier sterile technique utilized including caps, mask, sterile gowns, sterile gloves, large sterile drape, hand hygiene, and Betadine prep. Patient positioned supine position on the fluoroscopy table. The right inguinal region was prepped and draped in the usual sterile fashion. Ultrasound survey of the right inguinal region was performed with images stored and sent to PACs. A micropuncture needle was used access the right common femoral artery under ultrasound. With excellent arterial blood flow returned, and an .018 micro wire was passed through the needle, observed enter the abdominal aorta under fluoroscopy. The needle was removed, and a micropuncture sheath was placed over the wire. The inner dilator and wire were removed, and an 035 Bentson wire was advanced under fluoroscopy into the abdominal aorta. The sheath was removed and a standard 5 [place] vascular sheath was placed. The dilator was removed and the sheath was flushed. Omni Flush catheter was used to navigate a Bentson wire over the aortic bifurcation into the left iliac system. Catheter was exchanged for a C2 catheter advanced into the  hypogastric artery with an angiogram performed. Catheter was withdrawn and redirected into the external iliac artery. Angiogram was performed. A Rosen wire was advanced into the proximal  femoral system and the catheter was withdrawn. Six French right tip sheath was advanced over the bifurcation into the left iliac system. 7 mm x 4 cm balloon was advanced over the Rosen wire into the proximal left external iliac artery. Balloon was gently inflated to low atmospheric pressure, with inflation of approximately 4 minutes. At this time, measurements of the left external iliac artery were performed. Balloon was deflated, the balloon catheter was advanced into the proximal left femoral system, and the 035 Rosen wire was exchanged for an 018 S-V8 wire. Balloon catheter was removed. A 71mm x 36mm Viabahn stent graft was select thinned and advanced over the 018 wire to the targeted the avulsed artery. Stent graft was deployed. Balloon angioplasty to the nominal diameter of 6 mm was performed. Final angiogram was performed. The 6 French 35 cm right tip sheath was exchanged for a standard 10 cm 6 French sheath. Sheath was elected to remain given the patient's coagulopathy. Patient tolerated the procedure well with no significant blood loss. IMPRESSION: Status post left lower extremity angiogram with emergent covered stent embolization of distal external iliac artery branch vessel pseudoaneurysm/avulsion, with placement of stent graft. Signed, Dulcy Fanny. Earleen Newport, DO [facility] Radiology Electronically Signed   By: Corrie Mckusick D.O.   On: 12/26/2015 22:45   Ir Angiogram Selective Each Additional Vessel  12/26/2015  INDICATION: 75 year old female with a history of a hip arthroplasty and hypotension. EXAM: IR EMBO ART VEN HEMORR LYMPH EXTRAV INC GUIDE ROADMAPPING; IR ULTRASOUND GUIDANCE VASC ACCESS RIGHT; ARTERIOGRAPHY; PELVIC SELECTIVE ARTERIOGRAPHY; LEFT EXTREMITY ARTERIOGRAPHY; ADDITIONAL ARTERIOGRAPHY MEDICATIONS: None ANESTHESIA/SEDATION: Patient was intubated. Anesthesia team managed the endotracheal airway CONTRAST:  50 cc Isovue FLUOROSCOPY TIME:  Fluoroscopy  Time: 5 minutes 42 seconds COMPLICATIONS: None PROCEDURE: Informed consent was obtained from the patient following explanation of the procedure, risks, benefits and alternatives. The patient understands, agrees and consents for the procedure. All questions were addressed. A time out was performed prior to the initiation of the procedure. Maximal barrier sterile technique utilized including caps, mask, sterile gowns, sterile gloves, large sterile drape, hand hygiene, and Betadine prep. Patient positioned supine position on the fluoroscopy table. The right inguinal region was prepped and draped in the usual sterile fashion. Ultrasound survey of the right inguinal region was performed with images stored and sent to PACs. A micropuncture needle was used access the right common femoral artery under ultrasound. With excellent arterial blood flow returned, and an .018 micro wire was passed through the needle, observed enter the abdominal aorta under fluoroscopy. The needle was removed, and a micropuncture sheath was placed over the wire. The inner dilator and wire were removed, and an 035 Bentson wire was advanced under fluoroscopy into the abdominal aorta. The sheath was removed and a standard 5 [place] vascular sheath was placed. The dilator was removed and the sheath was flushed. Omni Flush catheter was used to navigate a Bentson wire over the aortic bifurcation into the left iliac system. Catheter was exchanged for a C2 catheter advanced into the hypogastric artery with an angiogram performed. Catheter was withdrawn and redirected into the external iliac artery. Angiogram was performed. A Rosen wire was advanced into the proximal femoral system and the catheter was withdrawn. Six French right tip sheath was advanced over the bifurcation into the left iliac system.  7 mm x 4 cm balloon was advanced over the Rosen wire into the proximal left external iliac artery. Balloon was gently inflated to low atmospheric pressure,  with inflation of approximately 4 minutes. At this time, measurements of the left external iliac artery were performed. Balloon was deflated, the balloon catheter was advanced into the proximal left femoral system, and the 035 Rosen wire was exchanged for an 018 S-V8 wire. Balloon catheter was removed. A 50mm x 43mm Viabahn stent graft was select thinned and advanced over the 018 wire to the targeted the avulsed artery. Stent graft was deployed. Balloon angioplasty to the nominal diameter of 6 mm was performed. Final angiogram was performed. The 6 French 35 cm right tip sheath was exchanged for a standard 10 cm 6 French sheath. Sheath was elected to remain given the patient's coagulopathy. Patient tolerated the procedure well with no significant blood loss. IMPRESSION: Status post left lower extremity angiogram with emergent covered stent embolization of distal external iliac artery branch vessel pseudoaneurysm/avulsion, with placement of stent graft. Signed, Dulcy Fanny. Earleen Newport, DO [facility] Radiology Electronically Signed   By: Corrie Mckusick D.O.   On: 12/26/2015 22:45   Ir Angiogram Follow Up Study  12/26/2015  INDICATION: 75 year old female with a history of a hip arthroplasty and hypotension. EXAM: IR EMBO ART VEN HEMORR LYMPH EXTRAV INC GUIDE ROADMAPPING; IR ULTRASOUND GUIDANCE VASC ACCESS RIGHT; ARTERIOGRAPHY; PELVIC SELECTIVE ARTERIOGRAPHY; LEFT EXTREMITY ARTERIOGRAPHY; ADDITIONAL ARTERIOGRAPHY MEDICATIONS: None ANESTHESIA/SEDATION: Patient was intubated. Anesthesia team managed the endotracheal airway CONTRAST:  50 cc Isovue FLUOROSCOPY TIME:  Fluoroscopy Time: 5 minutes 42 seconds COMPLICATIONS: None PROCEDURE: Informed consent was obtained from the patient following explanation of the procedure, risks, benefits and alternatives. The patient understands, agrees and consents for the procedure. All questions were addressed. A time out was performed prior to  the initiation of the procedure. Maximal barrier sterile technique utilized including caps, mask, sterile gowns, sterile gloves, large sterile drape, hand hygiene, and Betadine prep. Patient positioned supine position on the fluoroscopy table. The right inguinal region was prepped and draped in the usual sterile fashion. Ultrasound survey of the right inguinal region was performed with images stored and sent to PACs. A micropuncture needle was used access the right common femoral artery under ultrasound. With excellent arterial blood flow returned, and an .018 micro wire was passed through the needle, observed enter the abdominal aorta under fluoroscopy. The needle was removed, and a micropuncture sheath was placed over the wire. The inner dilator and wire were removed, and an 035 Bentson wire was advanced under fluoroscopy into the abdominal aorta. The sheath was removed and a standard 5 [place] vascular sheath was placed. The dilator was removed and the sheath was flushed. Omni Flush catheter was used to navigate a Bentson wire over the aortic bifurcation into the left iliac system. Catheter was exchanged for a C2 catheter advanced into the hypogastric artery with an angiogram performed. Catheter was withdrawn and redirected into the external iliac artery. Angiogram was performed. A Rosen wire was advanced into the proximal femoral system and the catheter was withdrawn. Six French right tip sheath was advanced over the bifurcation into the left iliac system. 7 mm x 4 cm balloon was advanced over the Rosen wire into the proximal left external iliac artery. Balloon was gently inflated to low atmospheric pressure, with inflation of approximately 4 minutes. At this time, measurements of the left external iliac artery were performed. Balloon was deflated, the balloon  catheter was advanced into the proximal left femoral system, and the 035 Rosen wire was exchanged for an 018 S-V8 wire. Balloon catheter was removed. A 67mm  x 59mm Viabahn stent graft was select thinned and advanced over the 018 wire to the targeted the avulsed artery. Stent graft was deployed. Balloon angioplasty to the nominal diameter of 6 mm was performed. Final angiogram was performed. The 6 French 35 cm right tip sheath was exchanged for a standard 10 cm 6 French sheath. Sheath was elected to remain given the patient's coagulopathy. Patient tolerated the procedure well with no significant blood loss. IMPRESSION: Status post left lower extremity angiogram with emergent covered stent embolization of distal external iliac artery branch vessel pseudoaneurysm/avulsion, with placement of stent graft. Signed, Dulcy Fanny. Earleen Newport, DO [facility] Radiology Electronically Signed   By: Corrie Mckusick D.O.   On: 12/26/2015 22:45   Ir US Guide Vasc Access Right  12/26/2015  INDICATION: 75 year old female with a history of a hip arthroplasty and hypotension. EXAM: IR EMBO ART VEN HEMORR LYMPH EXTRAV INC GUIDE ROADMAPPING; IR ULTRASOUND GUIDANCE VASC ACCESS RIGHT; ARTERIOGRAPHY; PELVIC SELECTIVE ARTERIOGRAPHY; LEFT EXTREMITY ARTERIOGRAPHY; ADDITIONAL ARTERIOGRAPHY MEDICATIONS: None ANESTHESIA/SEDATION: Patient was intubated. Anesthesia team managed the endotracheal airway CONTRAST:  50 cc Isovue FLUOROSCOPY TIME:  Fluoroscopy Time: 5 minutes 42 seconds COMPLICATIONS: None PROCEDURE: Informed consent was obtained from the patient following explanation of the procedure, risks, benefits and alternatives. The patient understands, agrees and consents for the procedure. All questions were addressed. A time out was performed prior to the initiation of the procedure. Maximal barrier sterile technique utilized including caps, mask, sterile gowns, sterile gloves, large sterile drape, hand hygiene, and Betadine prep. Patient positioned supine position on the fluoroscopy table. The right inguinal region was prepped and draped in the usual  sterile fashion. Ultrasound survey of the right inguinal region was performed with images stored and sent to PACs. A micropuncture needle was used access the right common femoral artery under ultrasound. With excellent arterial blood flow returned, and an .018 micro wire was passed through the needle, observed enter the abdominal aorta under fluoroscopy. The needle was removed, and a micropuncture sheath was placed over the wire. The inner dilator and wire were removed, and an 035 Bentson wire was advanced under fluoroscopy into the abdominal aorta. The sheath was removed and a standard 5 [place] vascular sheath was placed. The dilator was removed and the sheath was flushed. Omni Flush catheter was used to navigate a Bentson wire over the aortic bifurcation into the left iliac system. Catheter was exchanged for a C2 catheter advanced into the hypogastric artery with an angiogram performed. Catheter was withdrawn and redirected into the external iliac artery. Angiogram was performed. A Rosen wire was advanced into the proximal femoral system and the catheter was withdrawn. Six French right tip sheath was advanced over the bifurcation into the left iliac system. 7 mm x 4 cm balloon was advanced over the Rosen wire into the proximal left external iliac artery. Balloon was gently inflated to low atmospheric pressure, with inflation of approximately 4 minutes. At this time, measurements of the left external iliac artery were performed. Balloon was deflated, the balloon catheter was advanced into the proximal left femoral system, and the 035 Rosen wire was exchanged for an 018 S-V8 wire. Balloon catheter was removed. A 24mm x 3mm Viabahn stent graft was select thinned and advanced over the 018 wire to the targeted the avulsed artery. Stent graft  was deployed. Balloon angioplasty to the nominal diameter of 6 mm was performed. Final angiogram was performed. The 6 French 35 cm right tip sheath was exchanged for a standard 10  cm 6 French sheath. Sheath was elected to remain given the patient's coagulopathy. Patient tolerated the procedure well with no significant blood loss. IMPRESSION: Status post left lower extremity angiogram with emergent covered stent embolization of distal external iliac artery branch vessel pseudoaneurysm/avulsion, with placement of stent graft. Signed, Dulcy Fanny. Earleen Newport, DO [facility] Radiology Electronically Signed   By: Corrie Mckusick D.O.   On: 12/26/2015 22:45   Dg Chest Port 1 View  12/26/2015  CLINICAL DATA:  Patient status post ET tube placement. EXAM: PORTABLE CHEST 1 VIEW COMPARISON:  Chest radiograph 12/25/2015. FINDINGS: ET tube terminates in the mid trachea. Left subclavian central venous catheter tip projects over the superior vena cava. Enteric tube tip and side port terminates in the midesophagus, recommend advancement. Stable cardiac and mediastinal contours. Low lung volumes. Elevation of the right hemidiaphragm. Right-greater-than-left interstitial pulmonary opacities. No large pleural effusion or pneumothorax. IMPRESSION: ET tube tip and side port terminate in the midesophagus, recommend advancement. Bilateral, left-greater-than-right, interstitial opacities which may represent pulmonary edema. These results will be called to the ordering clinician or representative by the Radiologist Assistant, and communication documented in the PACS or zVision Dashboard. Electronically Signed   By: Lovey Newcomer M.D.   On: 12/26/2015 21:07   Dg Hip Operative Unilat W Or W/o Pelvis Left  12/26/2015  CLINICAL DATA:  Left anterior hip replacement due to fracture. EXAM: OPERATIVE LEFT HIP (WITH PELVIS IF PERFORMED) 1 VIEW TECHNIQUE: Fluoroscopic spot image(s) were submitted for interpretation post-operatively. COMPARISON:  12/25/2015 FINDINGS: A total left hip arthroplasty has been performed. Alignment of the prosthesis is grossly normal on this single view. No  evidence for a periprosthetic fracture. IMPRESSION: Left hip replacement without complicating features. Electronically Signed   By: Markus Daft M.D.   On: 12/26/2015 16:34   [place] Guide Roadmapping  12/26/2015  INDICATION: 75 year old female with a history of a hip arthroplasty and hypotension. EXAM: IR EMBO ART VEN HEMORR LYMPH EXTRAV INC GUIDE ROADMAPPING; IR ULTRASOUND GUIDANCE VASC ACCESS RIGHT; ARTERIOGRAPHY; PELVIC SELECTIVE ARTERIOGRAPHY; LEFT EXTREMITY ARTERIOGRAPHY; ADDITIONAL ARTERIOGRAPHY MEDICATIONS: None ANESTHESIA/SEDATION: Patient was intubated. Anesthesia team managed the endotracheal airway CONTRAST:  50 cc Isovue FLUOROSCOPY TIME:  Fluoroscopy Time: 5 minutes 42 seconds COMPLICATIONS: None PROCEDURE: Informed consent was obtained from the patient following explanation of the procedure, risks, benefits and alternatives. The patient understands, agrees and consents for the procedure. All questions were addressed. A time out was performed prior to the initiation of the procedure. Maximal barrier sterile technique utilized including caps, mask, sterile gowns, sterile gloves, large sterile drape, hand hygiene, and Betadine prep. Patient positioned supine position on the fluoroscopy table. The right inguinal region was prepped and draped in the usual sterile fashion. Ultrasound survey of the right inguinal region was performed with images stored and sent to PACs. A micropuncture needle was used access the right common femoral artery under ultrasound. With excellent arterial blood flow returned, and an .018 micro wire was passed through the needle, observed enter the abdominal aorta under fluoroscopy. The needle was removed, and a micropuncture sheath was placed over the wire. The inner dilator and wire were removed, and an 035 Bentson wire was advanced under fluoroscopy into the abdominal aorta. The sheath was removed and  a standard 5 [place] vascular sheath was  placed. The dilator was removed and the sheath was flushed. Omni Flush catheter was used to navigate a Bentson wire over the aortic bifurcation into the left iliac system. Catheter was exchanged for a C2 catheter advanced into the hypogastric artery with an angiogram performed. Catheter was withdrawn and redirected into the external iliac artery. Angiogram was performed. A Rosen wire was advanced into the proximal femoral system and the catheter was withdrawn. Six French right tip sheath was advanced over the bifurcation into the left iliac system. 7 mm x 4 cm balloon was advanced over the Rosen wire into the proximal left external iliac artery. Balloon was gently inflated to low atmospheric pressure, with inflation of approximately 4 minutes. At this time, measurements of the left external iliac artery were performed. Balloon was deflated, the balloon catheter was advanced into the proximal left femoral system, and the 035 Rosen wire was exchanged for an 018 S-V8 wire. Balloon catheter was removed. A 70mm x 59mm Viabahn stent graft was select thinned and advanced over the 018 wire to the targeted the avulsed artery. Stent graft was deployed. Balloon angioplasty to the nominal diameter of 6 mm was performed. Final angiogram was performed. The 6 French 35 cm right tip sheath was exchanged for a standard 10 cm 6 French sheath. Sheath was elected to remain given the patient's coagulopathy. Patient tolerated the procedure well with no significant blood loss. IMPRESSION: Status post left lower extremity angiogram with emergent covered stent embolization of distal external iliac artery branch vessel pseudoaneurysm/avulsion, with placement of stent graft. Signed, Dulcy Fanny. Earleen Newport, DO [facility] Rad[facility] Electronically Signed   By: Corrie Mckusick D.O.   On: 12/26/2015 22:45    Anti-infectives: Anti-infectives    Start     Dose/Rate Route Frequency Ordered Stop   12/26/15  2100  ceFAZolin (ANCEF) IVPB 2g/100 mL premix     2 g 200 mL/hr over 30 Minutes Intravenous Every 6 hours 12/26/15 2023 12/27/15 0418   12/26/15 1300  ceFAZolin (ANCEF) IVPB 2g/100 mL premix     2 g 200 mL/hr over 30 Minutes Intravenous To [facility]Stay [facility] 12/26/15 1212 12/26/15 1518      Assessment/Plan: s/p Procedure(s): TOTAL HIP ARTHROPLASTY ANTERIOR APPROACH (Left) retroperitoneal bleed with large hematoma  No sign of abdominal compartment syndrome - abdomen softer Significant other issues ongoing with bilateral PE/ RA clot  We will sign off for now, but please call us back if any new issues arise.   LOS: 3 days    Chrisa Hassan K. 12/28/2015

## 2015-12-28 NOTE — Procedures (Signed)
Arterial Catheter Insertion Procedure Note Bonnie Salazar [id number] 08/24/1940  Procedure: Insertion of Arterial Catheter  Indications: Blood pressure monitoring  Procedure Details Consent: Unable to obtain consent because of emergent medical necessity. Time Out: Verified patient identification, verified procedure, site/side was marked, verified correct patient position, special equipment/implants available, medications/allergies/relevent history reviewed, required imaging and test results available.  Performed  Maximum sterile technique was used including antiseptics, gloves and hand hygiene. Skin prep: Chlorhexidine; local anesthetic administered 20 gauge catheter was inserted into right radial artery using the Seldinger technique.  Evaluation Blood flow good; BP tracing good. Complications: No apparent complications.   Bonnie Salazar 12/28/2015

## 2015-12-28 NOTE — Progress Notes (Addendum)
[facility] Progress Note Patient Name: Bonnie Salazar DOB: June 14, 1941 MRN: [id number]   Date of Service  12/28/2015  HPI/Events of Note  Discussed with Dr Donnetta Hutching (vasc surgery) and Earleen Newport (IR) no major arterial bleed/ clot burden in RA/PA's much less   eICU Interventions   monitor cvp/hgb serially , no  further lytics- could restart hep if hgb stable at next draw but no bolus     Intervention Category Major Interventions: Shock - evaluation and management  Christinia Gully 12/28/2015, 8:59 PM

## 2015-12-28 NOTE — Progress Notes (Signed)
eLink Physician-Brief Progress Note Patient Name: Bonnie Salazar DOB: 1940-09-22 MRN: [id number]   Date of Service  12/28/2015  HPI/Events of Note  HR sustained in the 160s with sats in the mid 80s.  BP of 156/138.  eICU Interventions  Plan: FM O2 to maintain sats Dilt injection and infusion     Intervention Category Intermediate Interventions: Arrhythmia - evaluation and management  DETERDING,ELIZABETH 12/28/2015, 6:20 AM

## 2015-12-28 NOTE — Progress Notes (Addendum)
Called to bedside for worsened hypoxemia associated with marked tachycardia. On my arrival, patient on NRB with HR 90s and sats 98%. She denies chest pain, but still feels short of breath. She does have increased respiratory effort. We talked about the option of re-intubation to secure her airway, assist her breathing and help her oxygen levels. She would prefer to wait. I have asked nursing staff / RT to start weaning her oxygen. Should she be unable to be weaned from NRB, intubation would be appropriate and she understands this.   Yisroel Ramming, MD Pulmonary and Critical Care 12/28/2015 6:50 AM

## 2015-12-28 NOTE — Progress Notes (Signed)
Interventional Radiology Progress Note  Interval update:  Patient status post initiation of catheter thrombolysis for giant right atrial thrombus, with catheter via the right CFV.  tPA initiated at approximately 3pm.  Initial fibrinogen >300.   Notified by ICU team for acute respiratory change with accompanied onset of leg and back pain, and tPA infusion was immediately stopped.  Change in status ~6pm with intubation and CPR/resuscitation.  After airway secured and resuscitation patient transported for CTA of abdomen and pelvis.  I accompanied patient to CT and VIR team notified for potential angiogram.    Once study performed and reviewed, results were discussed with Dr. Donnetta Hutching of Vascular Surgery and the CCM.   CT negative for new pelvic/abdominal hemorrhage.  Questionable venous bleeding or small artery of thigh musculature, with associated hematoma.  This is likely not amenable to a directed embolization, given high likelihood of venous source.  CT shows smaller PE burden of subsegmental lower lobes.   No atrial mass is appreciated on CT.  Wound still recommend follow up ECHO in am.  The imaging does not account for the acute change in her respiratory status, but thigh hematoma could account for leg/pelvic pain.    Dr. Percell Miller updated with findings and events.  Patient's husband updated.    Will continue to follow.  Agree with medical management.    Signed,  Dulcy Fanny. Earleen Newport, DO

## 2015-12-28 NOTE — Procedures (Addendum)
Intubation Procedure Note Bonnie Salazar [id number] [birth year]  Procedure: Intubation Indications: Respiratory insufficiency  Procedure Details Consent: Unable to obtain consent because of emergent medical necessity. Time Out: Verified patient identification, verified procedure, site/side was marked, verified correct patient position, special equipment/implants available, medications/allergies/relevent history reviewed, required imaging and test results available.  Performed  Maximum sterile technique was used including gloves, hand hygiene and mask.  MAC    Evaluation Hemodynamic Status: BP stable throughout; O2 sats: stable throughout Patient's Current Condition: stable Complications: No apparent complications Patient did tolerate procedure well. Chest X-ray ordered to verify placement.  CXR: pending.   Bonnie Salazar 12/28/2015

## 2015-12-28 NOTE — Progress Notes (Signed)
ANTICOAGULATION CONSULT NOTE - Follow Up Consult  Pharmacy Consult:  Heparin Indication:  History of Afib + New bilateral PE  No Known Allergies  Patient Measurements: Height: 5\' 7"  (170.2 cm) Weight: 162 lb 11.2 oz (73.8 kg) IBW/kg (Calculated) : 61.6 Heparin Dosing Weight: 74 kg  Vital Signs: Temp: 97.3 F (36.3 C) (06/18 1145) Temp Source: Oral (06/18 1145) BP: 167/93 mmHg (06/18 1350) Pulse Rate: 107 (06/18 1350)  Labs:  Recent Labs  12/26/15 1740  12/27/15 12/27/15 0400 12/27/15 0743 12/27/15 1037 12/27/15 1516 12/27/15 1526 12/27/15 2145 12/27/15 2345 12/28/15 0038 12/28/15 0430 12/28/15 1252  HGB 10.7*  < > 11.6* 11.1* 10.7*  --  10.5*  --  10.2*  --   --   --   --   HCT 31.6*  < > 34.3* 32.3* 30.7*  --  30.5*  --  30.2*  --   --   --   --   PLT 87*  --  75*  --  61*  --   --   --   --   --   --   --   --   APTT 29  --   --   --   --   --   --   --   --   --   --   --   --   LABPROT 22.6*  --  16.8* 17.0*  --   --   --   --   --   --   --   --   --   INR 2.01*  --  1.35 1.37  --   --   --   --   --   --   --   --   --   HEPARINUNFRC  --   --   --   --   --   --   --   --   --  0.76*  --  0.89* 0.82*  CREATININE  --   --  0.75 0.78  --   --   --   --   --   --  0.84  --   --   TROPONINI 0.03  --  0.Bonnie* 1.20*  --  2.00*  --  2.06*  --   --   --   --   --   < > = values in this interval not displayed.  Estimated Creatinine Clearance: 57.1 mL/min (by C-G formula based on Cr of 0.84).     Assessment: Bonnie Salazar presented with mechanical fall and femoral neck fracture who is s/p THA on 6/16/7.  Found to have a large mobile mass in the right atrium and bilateral PE with RV strain.  She also has a history of Afib.  Pharmacy managing IV heparin for anticoagulation.  Heparin level remains supra-therapeutic; no bleeding reported although patient's platelet count has dropped significantly.  She is currently in IR for thrombolytic therapy.   Goal of Therapy:  Heparin  level 0.3-0.7 units/ml Monitor platelets by anticoagulation protocol: Yes    Plan:  - Reduce heparin gtt to 700 units/hr - Repeat HL in 8 hours - F/U post IR   Aniesa Boback D. Mina Marble, PharmD, BCPS Pager:  [phone] 12/28/2015, 2:08 PM

## 2015-12-28 NOTE — Progress Notes (Signed)
ANTICOAGULATION CONSULT NOTE - Follow-up Consult  Pharmacy Consult for Heparin Indication: large mobile mass in the right atrium and bilateral extensive pulmonary emboli with RV strain   No Known Allergies  Patient Measurements: Height: 5\' 7"  (170.2 cm) Weight: 162 lb 11.2 oz (73.8 kg) IBW/kg (Calculated) : 61.6 Heparin Dosing Weight: 74 kg  Vital Signs: Temp: 98 F (36.7 C) (06/17 2330) Temp Source: Oral (06/17 2330) BP: 130/82 mmHg (06/17 1900) Pulse Rate: 98 (06/17 1900)  Labs:  Recent Labs  12/26/15 0444  12/26/15 1740  12/27/15 12/27/15 0400 12/27/15 0743 12/27/15 1037 12/27/15 1516 12/27/15 1526 12/27/15 2145 12/27/15 2345  HGB 10.7*  < > 10.7*  < > 11.6* 11.1* 10.7*  --  10.5*  --  10.2*  --   HCT 32.8*  < > 31.6*  < > 34.3* 32.3* 30.7*  --  30.5*  --  30.2*  --   PLT 218  --  87*  --  75*  --  61*  --   --   --   --   --   APTT  --   --  29  --   --   --   --   --   --   --   --   --   LABPROT  --   --  22.6*  --  16.8* 17.0*  --   --   --   --   --   --   INR  --   --  2.01*  --  1.35 1.37  --   --   --   --   --   --   HEPARINUNFRC  --   --   --   --   --   --   --   --   --   --   --  0.76*  CREATININE 0.77  --   --   --  0.75 0.78  --   --   --   --   --   --   TROPONINI  --   < > 0.03  --  0.62* 1.20*  --  2.00*  --  2.06*  --   --   < > = values in this interval not displayed.  Estimated Creatinine Clearance: 60 mL/min (by C-G formula based on Cr of 0.78).   Assessment: 75yo female here for mechanical fall and femoral neck fracture -s/p THA 6/16. Pt on heparin for large mobile mass in the right atrium and bilateral extensive pulmonary emboli with RV strain; also atrial fibrillation. Heparin level 0.76 (slightly supratherapeutic) on 1100 units/hr. No bleeding noted. Plt down to 61 (from 281 on admission) - heparin started after plt decreased.  Goal of Therapy:  Heparin level 0.3-0.7 units/ml Monitor platelets by anticoagulation protocol: Yes   Plan:   Decrease heparin to 1000 units/hr and f/u 8 hr heparin level  Sherlon Handing, PharmD, BCPS Clinical pharmacist, pager [phone] 12/28/2015,12:30 AM

## 2015-12-28 NOTE — Progress Notes (Signed)
Interventional Radiology Progress Note  75 yo female SP left hip arthroplasty with post-operative hemorrhage, embolized with left external iliac artery covered stent, now with hemodynamic instability secondary to right atrial thrombus and PE.    Etiology of PE/thrombus is presumed to be from compression of iliac system.  Currently with full systemic anti-coagulation.   Multi-disciplinary discussion with Cardiothoracic Surgery and CCM regarding treatment options.  Right atrial thrombus is generally accepted to have a high morbidity/mortality.  Agree with Dr. Cyndia Bent that options for right atrial thrombus are generally surgical embolectomy, catheter directed embolectomy, and catheter directed infusion thrombolysis.    Patient has elected to proceed with least invasive first, which would be catheter thrombolysis with infusion of thrombolytics.  Thrombolytics are described as a successful treatment option (PMID: [id number]), with low peri-procedure risk.  Discussed this option with the family and patient as a reasonable step, although potential for incomplete clot removal is greater than that with surgery.  The procedural risk, on the other hand, for CDT would be less than that for surgical embolectomy.    I discussed the procedure at length with the patient and her husband, with specific risks including bleeding, infection, vascular injury, contrast reaction, kidney injury, further procedure/surgery, PE, cardiopulmonary collapse, disability, death.    They would like to proceed with thrombolysis.   Plan is to proceed with catheter placement today and initiate pharmacologic thrombolysis.  Will speak to ICU about repeat ECHO to determine residual thrombus burden in am.    All of the patient's questions were answered, patient is agreeable to proceed. Consent signed and in chart.  Signed,  Dulcy Fanny. Earleen Newport, DO

## 2015-12-28 NOTE — Progress Notes (Signed)
   12/28/15 1900  Clinical Encounter Type  Visited With Family  Visit Type Code  Spiritual Encounters  Spiritual Needs Emotional  CHP responded to Code Blue and stayed with husband of patient outside room.  Offered presence and emotional support. Patient getting scan. CHP will follow up and will recommend PM CHP follow up as well. Roe Coombs 12/28/2015

## 2015-12-28 NOTE — Progress Notes (Signed)
Cardiac arrest with PEA, resuscitated.  Spoke with vascular surgery, nothing to offer.  IR MD here and taking patient down.  Husband updated.  Rush Farmer, M.D. [facility] Pulmonary/Critical Care Medicine. Pager: [phone]. After hours pager: [phone].

## 2015-12-28 NOTE — Progress Notes (Signed)
eLink Physician-Brief Progress Note Patient Name: Bonnie Salazar DOB: May 25, 1941 MRN: [id number]   Date of Service  12/28/2015  HPI/Events of Note  Sudden drop in bp/ back pain while on TPA  Discussed with nursing/ Dr Donnetta Hutching  Not a surgical  Candidate but could do arteriogram thru sheath  eICU Interventions  Notified Dr Nelda Marseille to see/ held TPA, doubled fluids and check cvp/ hgb     Intervention Category Major Interventions: Shock - evaluation and management  Bonnie Salazar 12/28/2015, 6:24 PM

## 2015-12-28 NOTE — Progress Notes (Addendum)
Subjective:  Events of yesterday and evening since seen noted.  She has had hypoxemia, and tachycardia and is now requiring increased oxygen.  CT scan showed significant bilateral pulmonary emboli and increased RV ratio.  The echocardiogram this morning shows the mass tol be unchanged in the atrium except in some views it is now prolapsing through the tricuspid valve.  At the present time she is not having any chest pain.  Has PVCs and some intermittent atrial arrhythmias also noted.  Objective:  Vital Signs in the last 24 hours: BP 155/95 mmHg  Pulse 103  Temp(Src) 97.9 F (36.6 C) (Oral)  Resp 30  Ht 5\' 7"  (1.702 m)  Wt 73.8 kg (162 lb 11.2 oz)  BMI 25.48 kg/m2  SpO2 96%  Physical Exam: Pleasant female currently on nonrebreather mildly tachypneic Lungs:  Clear  Cardiac:  Regular rhythm, normal S1 and S2, no S3, holosystolic 2/6 murmur of mitral regurgitation  Abdomen:  Soft, nontender, no masses Extremities:  No edema present  Intake/Output from previous day: 06/17 0701 - 06/18 0700 In: 2040.8 [I.V.:1990.8; IV Piggyback:50] Out: 950 [Urine:950] Weight Filed Weights   12/26/15 2100  Weight: 73.8 kg (162 lb 11.2 oz)    Lab Results: Basic Metabolic Panel:  Recent Labs  12/27/15 0400 12/28/15 0038  NA 140 140  K 3.6 3.8  CL 110 110  CO2 22 23  GLUCOSE 145* 133*  BUN 14 15  CREATININE 0.78 0.84    CBC:  Recent Labs  12/25/15 2013  12/27/15  12/27/15 0743 12/27/15 1516 12/27/15 2145  WBC 12.6*  < > 8.3  --  7.2  --   --   NEUTROABS 10.8*  --   --   --   --   --   --   HGB 12.0  < > 11.6*  < > 10.7* 10.5* 10.2*  HCT 35.3*  < > 34.3*  < > 30.7* 30.5* 30.2*  MCV 85.1  < > 85.3  --  84.1  --   --   PLT 246  < > 75*  --  61*  --   --   < > = values in this interval not displayed.  Cardiac Panel (last 3 results)  Recent Labs  12/27/15 0400 12/27/15 1037 12/27/15 1526  TROPONINI 1.20* 2.00* 2.06*   Telemetry: Sinus with PVCs and some intermittent atrial  arrhythmias  Assessment/Plan:  1.  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  Bilateral pulmonary emboli with some evidence of decompensation of her right ventricle unable to assess pulmonary pressures 3.  Mitral valve prolapse with severe mitral regurgitation and likely unrelated 4.  Recent repair of a left femoral artery laceration  Recommendations:  Long discussion today with patient, husband, also spoke to interventional radiology, Dr. Cyndia Bent, and Dr. Nelda Marseille.  At the present time she has bilateral pulmonary emboli and evidence of some compensation even since I saw her yesterday.  She is on anticoagulation but concerned that additional clot embolization would  result in further decompensation in terms of her pulmonary status.  The size and mobility of the clot would make any catheter-based intervention of the pulmonary arteries difficult due to potential of dislodgment of the clot by catheter-based therapy into the pulmonary artery system.  Would consider if there is a way that there can be any sort of clot aspiration of the clot within the right atrium.  If clot could be  successfully retrieved and it also might be possible to place a retrievable filter in the IVC to prevent further embolization from the presumed pelvic source.  Following that she would need to be anticoagulated.  Obviously critical situation with difficult choices.  Critical care time spent 1 hour and 15 minutes.  Kerry Hough  MD [facility] Cardiology  12/28/2015, 8:29 AM

## 2015-12-28 NOTE — Progress Notes (Signed)
ANTICOAGULATION CONSULT NOTE - Follow-up Consult  Pharmacy Consult for Heparin Indication: large mobile mass in the right atrium and bilateral extensive pulmonary emboli with RV strain   Labs:  Recent Labs  12/26/15 1740  12/27/15 12/27/15 0400 12/27/15 0743 12/27/15 1037 12/27/15 1516 12/27/15 1526 12/27/15 2145 12/27/15 2345 12/28/15 0038 12/28/15 0430  HGB 10.7*  < > 11.6* 11.1* 10.7*  --  10.5*  --  10.2*  --   --   --   HCT 31.6*  < > 34.3* 32.3* 30.7*  --  30.5*  --  30.2*  --   --   --   PLT 87*  --  75*  --  61*  --   --   --   --   --   --   --   APTT 29  --   --   --   --   --   --   --   --   --   --   --   LABPROT 22.6*  --  16.8* 17.0*  --   --   --   --   --   --   --   --   INR 2.01*  --  1.35 1.37  --   --   --   --   --   --   --   --   HEPARINUNFRC  --   --   --   --   --   --   --   --   --  0.76*  --  0.89*  CREATININE  --   --  0.75 0.78  --   --   --   --   --   --  0.84  --   TROPONINI 0.03  --  0.62* 1.20*  --  2.00*  --  2.06*  --   --   --   --   < > = values in this interval not displayed.   Assessment: 75yo female remains above goal on heparin with higher level despite rate decrease.  Goal of Therapy:  Heparin level 0.3-0.7 units/ml   Plan:  Will decrease heparin gtt by 2 units/kg/hr to 850 units/hr and check level in [place], PharmD, BCPS   12/28/2015,5:11 AM

## 2015-12-28 NOTE — Progress Notes (Signed)
  Echocardiogram 2D Echocardiogram has been performed.  Bonnie Salazar 12/28/2015, 8:44 AM

## 2015-12-28 NOTE — Progress Notes (Signed)
eLink Physician-Brief Progress Note Patient Name: Bonnie Salazar DOB: 10/14/1940 MRN: [id number]   Date of Service  12/28/2015  HPI/Events of Note  Still dep on pressors @ `100 mcg/min Neo   Intake/Output Summary (Last 24 hours) at 12/28/15 2206 Last data filed at 12/28/15 1906  Gross per 24 hour  Intake 2632.13 ml  Output   1135 ml  Net 1497.13 ml   CVP 6   eICU Interventions  NS x one more liter      Intervention Category Major Interventions: Shock - evaluation and management  Christinia Gully 12/28/2015, 10:06 PM

## 2015-12-28 NOTE — Progress Notes (Signed)
2 Days Post-Op Procedure(s) (LRB): TOTAL HIP ARTHROPLASTY ANTERIOR APPROACH (Left) Subjective: Sleepy but responds. Some shortness of breath. On 100% NRB, sats 97%  Objective: Vital signs in last 24 hours: Temp:  [97.9 F (36.6 C)-100.2 F (37.9 C)] 97.9 F (36.6 C) (06/18 0741) Pulse Rate:  [40-128] 98 (06/18 0900) Cardiac Rhythm:  [-] Sinus tachycardia (06/18 0800) Resp:  [19-33] 33 (06/18 0900) BP: (109-172)/(69-130) 155/119 mmHg (06/18 0900) SpO2:  [90 %-97 %] 97 % (06/18 0900) FiO2 (%):  [100 %] 100 % (06/18 0800)  Hemodynamic parameters for last 24 hours:    Intake/Output from previous day: 06/17 0701 - 06/18 0700 In: 2040.8 [I.V.:1990.8; IV Piggyback:50] Out: 950 [Urine:950] Intake/Output this shift: Total I/O In: 227 [I.V.:177; IV Piggyback:50] Out: 115 [Urine:115]  General appearance: increased work of breathing Heart: irregular rate and rhythm and 2/6 systolic murmur Lungs: diminished breath sounds bibasilar  Lab Results:  Recent Labs  12/27/15  12/27/15 0743 12/27/15 1516 12/27/15 2145  WBC 8.3  --  7.2  --   --   HGB 11.6*  < > 10.7* 10.5* 10.2*  HCT 34.3*  < > 30.7* 30.5* 30.2*  PLT 75*  --  61*  --   --   < > = values in this interval not displayed. BMET:  Recent Labs  12/27/15 0400 12/28/15 0038  NA 140 140  K 3.6 3.8  CL 110 110  CO2 22 23  GLUCOSE 145* 133*  BUN 14 15  CREATININE 0.78 0.84  CALCIUM 8.4* 8.2*    PT/INR:  Recent Labs  12/27/15 0400  LABPROT 17.0*  INR 1.37   ABG    Component Value Date/Time   PHART 7.268* 12/26/2015 1855   HCO3 17.6* 12/26/2015 1855   TCO2 19 12/26/2015 1855   ACIDBASEDEF 9.0* 12/26/2015 1855   O2SAT 100.0 12/26/2015 1855   CBG (last 3)   Recent Labs  12/26/15 2036 12/26/15 2358  GLUCAP 209* 212*   CXR: bilateral moderate pleural effusions and LL atelectasis. Vascular prominence  Assessment/Plan:  S/p left hip athroplasty Left pelvic arterial injury repaired with covered  stent Large pelvic hematoma Large right atrial clot with bilateral PE's, RV strain, increased work of breathing and oxygen requirement. Hemodynamically stable but with tachycardia. She has been anticoagulated with heparin since yesterday with slightly supra-therapeutic heparin levels. Repeat echo today shows continued presence of right atrial clot, reportedly more RV dilation.  Bilateral moderate pleural effusions with bilateral LL compressive atelectasis Thrombocytopenia to 61K yesterday am. Has not been repeated. 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  Her greatest risk at this time is the right atrial clot which could embolize and may result in cardiovascular collapse. She is anticoagulated but still at significant risk for this. The options are a catheter based thrombectomy which I have no experience with and open surgery with CP bypass and surgical RA thrombectomy. I would leave her MR alone since adding MV repair or replacement would add significant risk at this time. She would need IVC filter afterwards because she could not be anticoagulated for a few days. I discussed the options with the patient's husband and he does not want to pursue open surgery at this time but would like to consider a catheter based thrombectomy at another institution if possible if they have more experience with it. CCM NP is investigating that. I will be available for further consultation  and surgery today if needed. I discussed the high risk of mortality without removal of the right atrial clot with the husband and the significant mortality even if it is removed.        LOS: 3 days    Gaye Pollack 12/28/2015

## 2015-12-28 NOTE — Progress Notes (Signed)
PT Cancellation Note  Patient Details Name: Bonnie Salazar MRN: [id number] DOB: 1941/02/04   Cancelled Treatment:    Reason Eval/Treat Not Completed: Patient not medically ready respiratory difficulties, will hold mobility at this time.   Duncan Dull 12/28/2015, 7:06 AM Alben Deeds, PT DPT

## 2015-12-28 NOTE — Progress Notes (Signed)
PULMONARY / CRITICAL CARE MEDICINE   Name: URSA MARCHESI MRN: [id number] DOB: [birth year]    ADMISSION DATE:  12/25/2015 CONSULTATION DATE:  12/26/15  REFERRING MD:  Trauma service  CHIEF COMPLAINT:  Hip fracture; need for mechanical ventilation post-operatively  HISTORY OF PRESENT ILLNESS:   Ms. Obenauf is a 75F who presented 12/25/15 after an fall at home with resultant left femoral neck fracture. She was taken to the OR for operative repair 6/16. The initial repair appeared uneventful and she was extubated in the PACU. Shortly thereafter, she complained of abdominal pain and was noted to have a tense abdomen. CT scan showed extravasation into the LLQ; she was taken to IR and underwent covered stent of the distal external iliac artery to the inguinal ligament. Vascular surgery was also consulted. No signs of distal ischemia on his exam. There is some concern for abdominal compartment syndrome, as well. MTP was initiated and she received 11u pRBC, 7FFP, 2 PLT pheresis, 2 cryoprecipitate.    SUBJECTIVE:  Anxious and in distress.   VITAL SIGNS: BP 172/98 mmHg  Pulse 103  Temp(Src) 97.9 F (36.6 C) (Oral)  Resp 30  Ht 5\' 7"  (1.702 m)  Wt 162 lb 11.2 oz (73.8 kg)  BMI 25.48 kg/m2  SpO2 96%  HEMODYNAMICS:    VENTILATOR SETTINGS: Vent Mode:  [-] PRVC FiO2 (%):  [40 %-100 %] 100 % Set Rate:  [14 bmp] 14 bmp Vt Set:  [500 mL] 500 mL PEEP:  [5 cmH20] 5 cmH20 Plateau Pressure:  [22 cmH20] 22 cmH20  INTAKE / OUTPUT:  Intake/Output Summary (Last 24 hours) at 12/28/15 [id number] Last data filed at 12/28/15 0800  Gross per 24 hour  Intake 2048.18 ml  Output    850 ml  Net 1198.18 ml    PHYSICAL EXAMINATION:  General Well nourished, well developed, anxious, and now in sig distress.   HEENT No gross abnormalities. MM dry, Face mask in place   Pulmonary Decreased in bases, marked accessory use. Increased rate.   Cardiovascular Intermittent & irregular  rapid rate; II/VI diastolic  heart murmur.  Distal pulses palpable.  Abdomen Distended, hypoactive bowel sounds.  Musculoskeletal L hip post-surgical changes / dressing in place  Lymphatics No cervical, supraclavicular or axillary adenopathy.   Neurologic Grossly intact. No focal deficits.   Skin/Integuement No rash, no cyanosis, no clubbing. Pale. L [place] CVC in place. LLE edematous     LABS:  BMET  Recent Labs Lab 12/27/15 12/27/15 0400 12/28/15 0038  NA 138 140 140  K 3.6 3.6 3.8  CL 108 110 110  CO2 22 22 23   BUN 12 14 15   CREATININE 0.75 0.78 0.84  GLUCOSE 196* 145* 133*    Electrolytes  Recent Labs Lab 12/27/15 12/27/15 0400 12/28/15 0038  CALCIUM 8.9 8.4* 8.2*    CBC  Recent Labs Lab 12/26/15 1740  12/27/15  12/27/15 0743 12/27/15 1516 12/27/15 2145  WBC 20.3*  --  8.3  --  7.2  --   --   HGB 10.7*  < > 11.6*  < > 10.7* 10.5* 10.2*  HCT 31.6*  < > 34.3*  < > 30.7* 30.5* 30.2*  PLT 87*  --  75*  --  61*  --   --   < > = values in this interval not displayed.  Coag's  Recent Labs Lab 12/26/15 1740 12/27/15 12/27/15 0400  APTT 29  --   --   INR 2.01* 1.35 1.37    Sepsis Markers  Recent Labs Lab 12/27/15 0040  LATICACIDVEN 2.4*    ABG  Recent Labs Lab 12/26/15 1708 12/26/15 1720 12/26/15 1855  PHART 7.372 7.344* 7.268*  PCO2ART 35.1 36.1 38.5  PO2ART 112.0* 121.0* 465.0*    Liver Enzymes  Recent Labs Lab 12/27/15 12/27/15 0400  AST 51* 45*  ALT 28 25  ALKPHOS 66 59  BILITOT 1.2 1.1  ALBUMIN 3.5 3.1*    Cardiac Enzymes  Recent Labs Lab 12/27/15 0400 12/27/15 1037 12/27/15 1526  TROPONINI 1.20* 2.00* 2.06*    Glucose  Recent Labs Lab 12/26/15 2036 12/26/15 2358  GLUCAP 209* 212*    Imaging Ct Angio Chest Pe W Or Wo Contrast  12/27/2015  CLINICAL DATA:  Shortness of breath. Atrial mass versus blood clot seen on cardiac echo. EXAM: CT ANGIOGRAPHY CHEST WITH CONTRAST TECHNIQUE: Multidetector CT imaging of the chest was performed using the  standard protocol during bolus administration of intravenous contrast. Multiplanar CT image reconstructions and MIPs were obtained to evaluate the vascular anatomy. CONTRAST:  80 cc Isovue 370 intravenously. COMPARISON:  Chest radiograph 12/26/2015 FINDINGS: Mediastinum/Lymph Nodes: Left subclavian approach central venous catheter terminates in the superior vena cava. There are bilateral pulmonary emboli. There is a nearly completely occlusive thrombus within the left lower lobar pulmonary artery, with clot extending to the basilar segmental branches. Nonocclusive clot is seen within the left upper, lingular lobar pulmonary artery, right upper, right middle and right lower lobe pulmonary arteries. There is evidence of right heart strain with right/left ventricular ratio over 1. Irregular hypoattenuated area within the right atrium may represent mixing of non-opacified blood from the inferior vena cava with contrast opacified blood, versus an intramural thrombus. No masses or pathologically enlarged lymph nodes identified. Lungs/Pleura: There are bilateral moderate in size pleural effusions. There is segmental atelectasis of the right lower lobe and subsegmental atelectasis of the left lower lobe. Upper abdomen: Water density abdominal ascites. Musculoskeletal: No chest wall mass or suspicious bone lesions identified. Review of the MIP images confirms the above findings. IMPRESSION: Bilateral pulmonary emboli, nearly occlusive in the left lower lobe segmental branches, involving all of the segmental pulmonary arteries. Evidence of right heart strain with right/left heart ratio of over 1. Heterogeneous area of hypoattenuation within the right atrium may represent right atrial clot versus mixing of non-opacified blood from the inferior vena cava. Given presence of extensive bilateral pulmonary emboli, presence of atrial clot becomes more plausible. If clinically important, gated cardiac CT or cardiac MRI may be  pursued. Bilateral pleural effusions and atelectatic changes of bilateral lung bases. Abdominal ascites. Critical Value/emergent results were called by telephone at the time of interpretation on 12/27/2015 at 7:06 pm to Dr. Halford Chessman, who verbally acknowledged these results. Electronically Signed   By: Fidela Salisbury M.D.   On: 12/27/2015 19:19  PCXR: bilateral pulmonary edema ETT good position. NGT was in mid esophagus.   STUDIES:  ECHO 6/17: - Left ventricle: The cavity size was normal. Systolic function was normal. The estimated ejection fraction was in the range of 50% to 55%. 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 - Left atrium: The atrium was mildly dilated.- Right ventricle: The cavity size was mildly dilated. Wall  thickness was normal.- Right atrium: There is a large mobile mass in the right atrium  that prolapses toward the mitral valve. CT chest 6/17: Bilateral pulmonary emboli, nearly occlusive in the left lower lobe segmental branches, involving  all of the segmental pulmonary arteries. Evidence of right heart strain with right/left heart ratio of over 1. Heterogeneous area of hypoattenuation within the right atrium may represent right atrial clot versus mixing of non-opacified blood from the inferior vena cava. Bilateral pleural effusions and atelectatic changes of bilateral lung bases. LE [place] 6/17: negative for DVT ECHO 6/18>>>  CULTURES: None  ANTIBIOTICS: Perioperative Ancef  SIGNIFICANT EVENTS: See HPI 6/17 extubated, AF w/ CE elevation so ECHO checked-->round RA thrombus. Started on heparin. Cards, CVTS and IR involved in care. Later that evening increased resp distress. FIO2 req increased. CT chest w/ bilateral PE 6/18 remaining hypoxic. Hgb stable, repeat ECHO obtained and consideration for EKOS.   LINES/TUBES: L [place] 6/16 Foley 6/16  PIV ETT  6/16-->6/17  DISCUSSION: Ms. Scofield is a 32F who remains intubated after a complicated L hip replacement with arterial avulsion of the left external iliac artery requiring covered stent placement by IR c/b intraabdominal/pelvic hematoma, hgb has been stable. Events since yesterday--> increased CEs, AF, ECHO w/ RA thrombus, later c/b PE, and hypoxic resp failure. She was placed on heparin and multiple specialties have been involved. As of this am her RV fxn is worse and there is still large clot in the RA. Awaiting call back from IR re: possible catheter lysis and in-put of IR and CVTS. May end up just going w/ IV systemic heparin only.   ASSESSMENT / PLAN:  PULMONARY A: Acute Hypoxic Respiratory Failure  Mild Pulmonary edema  Acute bilateral PE-->RV strain has increased but not sure that this is resp for her resp failure   P:   Repeat CXR Titrate O2 for sats >94% Cont heparin May require intubation   CARDIOVASCULAR A:  Troponin elevation -->likely demand ischemia  Heart murmur Intermittent AF Severe MR RA clott;  Now w/ acute PE (likely migrated) -->appreciate cards, thoracic and interventional assist.  P:  Monitor for hemodynamic instability Trend troponin For repeat right side ECHO Cont heparin-->consider EKOS given progressive resp failure IF evidence of RV strain.  Thrombectomy would be a consideration however per CVTS note she would be at high risk for concomitant MVR which would prevent her from anticoagulation after for short term at least   RENAL A:   No acute issues P:   Await repeat labs  GASTROINTESTINAL A:   Concern for abdominal compartment syndrome--> no evidence of active bleeding  P:   NPO Bladder pressure q shift  Famotidine for ppx  HEMATOLOGIC A:   Arterial hemorrhage s/p stenting and requiring massive transfusion propofol RA thrombus-->likely propagated from right pelvic vein compression from hematoma-->migrated and now with BILATERAL PE P:   Trend CBC Transfuse as needed Heparin gtt Repeat right sided ECHO--> may consider EKOS vs thrombectomy  INFECTIOUS A:   No acute issues - leukocytosis likely stress response S/p periop Ancef P:   Monitor  ENDOCRINE A:   No acute issues  P:   Trend glucose on chemistries   NEUROLOGIC A:   Anxiety and post-op pain  P:   RASS goal: -1 PRN fent Low dose ativan Will switch to PAD protocol IF requires intubation   FAMILY  - Updates: no family available  APP time 120  minutes: included: management of hemodynamics, and discussion of plan of care w/ bedside Nurse, and subspecialties  (IR, thoracic surgery, orthopedic specialties.   Erick Colace ACNP-BC [place] Pager # [phone] OR # [phone] if no answer   12/28/2015, 8:09 AM  Attending Note:  75 year old female s/p fall and hip fracture. Underwent repair with associated damage to the iliac artery. IR repaired damage. On exam, leg is neurovascularly intact and lungs are clear. Acute decompensation overnight and CTA was ordered that I reviewed myself that revealed a PE. 2D echo that I reviewed with Dr. Wynonia Lawman showed a large thrombus in the RA protruding through the tricuspid valve. Had multiple discussions with Dr. Cyndia Bent (CVTS), Jacqualyn Posey (IR) and Wynonia Lawman (cards) and after long discussions, patient was presented with 3 options, catheter lysis, transfer and getting the clot vacuumed out or embolectomy.  Patient after a long discussion decided on catheter lysis.  Went to IR and just returned appears stable, still requiring 100% NRB for now however.  The patient is critically ill with multiple organ systems failure and requires high complexity decision making for assessment and support, frequent evaluation and titration of therapies, application of advanced monitoring technologies and extensive interpretation of multiple databases.   Critical Care Time devoted to patient care services described in this  note is 100 Minutes. This time reflects time of care of this signee Dr [name]. This critical care time does not reflect procedure time, or teaching time or supervisory time of PA/NP/Med student/Med Resident etc but could involve care discussion time.  Rush Farmer, M.D. [facility] Pulmonary/Critical Care Medicine. Pager: [phone]. After hours pager: [phone].

## 2015-12-28 NOTE — Progress Notes (Signed)
Patient ID: Bonnie Salazar, female   DOB: Jan 12, 1941, 75 y.o.   MRN: [id number] Events noted. Patient had minimal change since this morning.  Decision was to proceed with catheter lysis of right atrial thrombus.  This was initiated.  The patient had sudden onset of left leg pain, back pain and no pain and no profound hypotension.  Actually had the arrest and was coded for 10 minutes.  Was able to resuscitate with a blood pressure.  No role for surgical exploration with unknown source for all of this cardiovascular collapse.  Patient was stabilized and taken back to CT scan.  I was present during the CT scan and reviewed this with Dr. Earleen Newport.  The scan shows no evidence of extravasation of her left external iliac covered stent.  No increase in the amount of pelvic hematoma from her study from Friday night 48 hours ago.  Only significant change is some more additional hematoma and her left thigh.  There is a questionable extravasation over the left lateral mid thigh versus the penis.  This certainly would not explain her abdominal pain back pain and cardiovascular collapse.  No possibility for coiling of this distal area and would be a disaster to attempt exploration in light of her coagulation status.  Would cause a much worsening bleeding then the area of this hematoma. Physical exam she is unresponsive on the bed.  Currently her blood pressure is 0000000 systolic and her heart rate is 122. She does have easily palpable right posterior tibial pulse and easily dopplerable left posterior tibial pulse Left thigh laterally is more distended than her right.  No role for surgical intervention.  Continue support

## 2015-12-29 ENCOUNTER — Inpatient Hospital Stay (HOSPITAL_COMMUNITY): Payer: Medicare HMO

## 2015-12-29 ENCOUNTER — Encounter (HOSPITAL_COMMUNITY): Payer: Self-pay | Admitting: Orthopedic Surgery

## 2015-12-29 LAB — PREPARE FRESH FROZEN PLASMA
UNIT DIVISION: 0
UNIT DIVISION: 0
UNIT DIVISION: 0
UNIT DIVISION: 0
UNIT DIVISION: 0
UNIT DIVISION: 0
Unit division: 0
Unit division: 0
Unit division: 0
Unit division: 0
Unit division: 0
Unit division: 0
Unit division: 0
Unit division: 0
Unit division: 0
Unit division: 0

## 2015-12-29 LAB — PREPARE CRYOPRECIPITATE
UNIT DIVISION: 0
Unit division: 0

## 2015-12-29 LAB — GLUCOSE, CAPILLARY
GLUCOSE-CAPILLARY: 138 mg/dL — AB (ref 65–99)
GLUCOSE-CAPILLARY: 236 mg/dL — AB (ref 65–99)
GLUCOSE-CAPILLARY: 95 mg/dL (ref 65–99)
Glucose-Capillary: 92 mg/dL (ref 65–99)
Glucose-Capillary: 124 mg/dL — ABNORMAL HIGH (ref 65–99)

## 2015-12-29 LAB — CBC
HCT: 26.2 % — ABNORMAL LOW (ref 36.0–46.0)
HCT: 23.4 % — ABNORMAL LOW (ref 36.0–46.0)
HCT: 21.7 % — ABNORMAL LOW (ref 36.0–46.0)
HEMOGLOBIN: 7.4 g/dL — AB (ref 12.0–15.0)
Hemoglobin: 9 g/dL — ABNORMAL LOW (ref 12.0–15.0)
Hemoglobin: 8 g/dL — ABNORMAL LOW (ref 12.0–15.0)
MCH: 29.6 pg (ref 26.0–34.0)
MCH: 29.9 pg (ref 26.0–34.0)
MCH: 30 pg (ref 26.0–34.0)
MCHC: 34.4 g/dL (ref 30.0–36.0)
MCHC: 34.2 g/dL (ref 30.0–36.0)
MCHC: 34.1 g/dL (ref 30.0–36.0)
MCV: 86.2 fL (ref 78.0–100.0)
MCV: 87.3 fL (ref 78.0–100.0)
MCV: 87.9 fL (ref 78.0–100.0)
PLATELETS: 64 10*3/uL — AB (ref 150–400)
PLATELETS: 55 10*3/uL — AB (ref 150–400)
Platelets: 60 10*3/uL — ABNORMAL LOW (ref 150–400)
RBC: 3.04 MIL/uL — ABNORMAL LOW (ref 3.87–5.11)
RBC: 2.68 MIL/uL — AB (ref 3.87–5.11)
RBC: 2.47 MIL/uL — ABNORMAL LOW (ref 3.87–5.11)
RDW: 15.3 % (ref 11.5–15.5)
RDW: 15.4 % (ref 11.5–15.5)
RDW: 15.6 % — ABNORMAL HIGH (ref 11.5–15.5)
WBC: 13 10*3/uL — ABNORMAL HIGH (ref 4.0–10.5)
WBC: 15.9 10*3/uL — AB (ref 4.0–10.5)
WBC: 17.5 10*3/uL — AB (ref 4.0–10.5)

## 2015-12-29 LAB — COMPREHENSIVE METABOLIC PANEL
ALT: 21 U/L (ref 14–54)
AST: 52 U/L — AB (ref 15–41)
Albumin: 2.5 g/dL — ABNORMAL LOW (ref 3.5–5.0)
Alkaline Phosphatase: 48 U/L (ref 38–126)
Anion gap: 7 (ref 5–15)
BUN: 20 mg/dL (ref 6–20)
CHLORIDE: 116 mmol/L — AB (ref 101–111)
CO2: 20 mmol/L — AB (ref 22–32)
CREATININE: 0.88 mg/dL (ref 0.44–1.00)
Calcium: 7.6 mg/dL — ABNORMAL LOW (ref 8.9–10.3)
GFR calc non Af Amer: >60 mL/min (ref >60)
Glucose, Bld: 134 mg/dL — ABNORMAL HIGH (ref 65–99)
Potassium: 3.3 mmol/L — ABNORMAL LOW (ref 3.5–5.1)
SODIUM: 143 mmol/L (ref 135–145)
Total Bilirubin: 0.6 mg/dL (ref 0.3–1.2)
Total Protein: 4.7 g/dL — ABNORMAL LOW (ref 6.5–8.1)

## 2015-12-29 LAB — PREPARE RBC (CROSSMATCH)

## 2015-12-29 LAB — HEPARIN LEVEL (UNFRACTIONATED): HEPARIN UNFRACTIONATED: 0.19 [IU]/mL — AB (ref 0.30–0.70)

## 2015-12-29 LAB — BLOOD PRODUCT ORDER (VERBAL) VERIFICATION

## 2015-12-29 MED ORDER — POTASSIUM CHLORIDE 20 MEQ/15ML (10%) PO SOLN
40.0000 meq | Freq: Three times a day (TID) | ORAL | Status: AC
  Administered 2015-12-29 (×2): 40 meq
  Filled 2015-12-29 (×3): qty 30

## 2015-12-29 MED ORDER — FENTANYL CITRATE (PF) 100 MCG/2ML IJ SOLN
50.0000 ug | Freq: Once | INTRAMUSCULAR | Status: AC
Start: 2015-12-29 — End: 2015-12-29
  Administered 2015-12-29: 50 ug via INTRAVENOUS

## 2015-12-29 MED ORDER — SODIUM CHLORIDE 0.9 % IV SOLN
25.0000 ug/h | INTRAVENOUS | Status: DC
  Administered 2015-12-29: 200 ug/h via INTRAVENOUS
  Administered 2015-12-29: 50 ug/h via INTRAVENOUS
  Administered 2015-12-30 – 2015-12-31 (×5): 300 ug/h via INTRAVENOUS
  Administered 2016-01-01 – 2016-01-02 (×3): 250 ug/h via INTRAVENOUS
  Filled 2015-12-29 (×11): qty 50

## 2015-12-29 MED ORDER — HEPARIN (PORCINE) IN NACL 100-0.45 UNIT/ML-% IJ SOLN
750.0000 [IU]/h | INTRAMUSCULAR | Status: DC
  Administered 2015-12-29: 700 [IU]/h via INTRAVENOUS
  Administered 2015-12-30: 750 [IU]/h via INTRAVENOUS
  Filled 2015-12-29 (×2): qty 250

## 2015-12-29 MED ORDER — FENTANYL BOLUS VIA INFUSION
25.0000 ug | INTRAVENOUS | Status: DC | PRN
  Administered 2015-12-29 – 2015-12-31 (×5): 25 ug via INTRAVENOUS
  Filled 2015-12-29: qty 25

## 2015-12-29 MED ORDER — ANTISEPTIC ORAL RINSE SOLUTION (CORINZ)
7.0000 mL | Freq: Four times a day (QID) | OROMUCOSAL | Status: DC
Start: 2015-12-29 — End: 2016-01-11
  Administered 2015-12-29 – 2016-01-10 (×53): 7 mL via OROMUCOSAL

## 2015-12-29 MED ORDER — VITAL AF 1.2 CAL PO LIQD
1000.0000 mL | ORAL | Status: DC
  Administered 2015-12-29 – 2016-01-01 (×4): 1000 mL

## 2015-12-29 MED ORDER — VITAL HIGH PROTEIN PO LIQD: 1000.0000 mL | ORAL | Status: DC

## 2015-12-29 MED FILL — Medication: Qty: 1 | Status: AC

## 2015-12-29 NOTE — Progress Notes (Signed)
Inpatient Diabetes Program Recommendations  AACE/ADA: New Consensus Statement on Inpatient Glycemic Control (2015)  Target Ranges:  Prepandial:   less than 140 mg/dL      Peak postprandial:   less than 180 mg/dL (1-2 hours)      Critically ill patients:  140 - 180 mg/dL   Lab Results  Component Value Date   GLUCAP 95 12/29/2015    Review of Glycemic Control  Inpatient Diabetes Program Recommendations:   Consider using the ICU Glycemic Control order-set. Thank you  Raoul Pitch MSN, RN,CDE Inpatient Diabetes Coordinator [phone] (team pager)

## 2015-12-29 NOTE — Progress Notes (Signed)
Referring Physician(s): Dr Valla Leaver  Supervising Physician: Aletta Edouard  Patient Status:  Inpatient  Chief Complaint:  Left hip replacement 6/16 Hemorrhage post surgery Covered stent embolization of left distal iliac artery branch pseudoaneurysm/avulsion  Developed SOB-- + B PE; +Rt heart strain Thrombolysis Rt atrial thrombus Developed increasing size of Left thigh hematoma --- no new intraperitoneal/or pelvic hemorrhage Venous lysis stopped  Subjective:  Pt intubated/vent Opens eyes Swollen thighs B Swollen feet    Allergies: Review of patient's allergies indicates no known allergies.  Medications: Prior to Admission medications   Medication Sig Start Date End Date Taking? Authorizing Provider  CALCIUM PO Take 2 tablets by mouth daily.   Yes Historical Provider, MD  chlorhexidine (PERIDEX) 0.12 % solution 15 mLs by Mouth Rinse route 2 (two) times daily. 12/22/15  Yes Historical Provider, MD  Cholecalciferol (VITAMIN D PO) Take 1 capsule by mouth daily.   Yes Historical Provider, MD  fluticasone (FLONASE) 50 MCG/ACT nasal spray Place 2 sprays into the nose daily as needed for allergies.  11/01/15  Yes Historical Provider, MD  GINKGO BILOBA PO Take 1 capsule by mouth daily.   Yes Historical Provider, MD  Misc Natural Products (GLUCOSAMINE CHONDROITIN TRIPLE) TABS Take 2 tablets by mouth daily.   Yes Historical Provider, MD  Multiple Vitamin (MULTIVITAMIN WITH MINERALS) TABS tablet Take 1 tablet by mouth daily.   Yes Historical Provider, MD  Multiple Vitamins-Minerals (ICAPS AREDS 2) CAPS Take 1 capsule by mouth 2 (two) times daily.   Yes Historical Provider, MD  naproxen sodium (ANAPROX) 220 MG tablet Take 220 mg by mouth at bedtime as needed (pain).   Yes Historical Provider, MD  Omega-3 Fatty Acids (OMEGA 3 PO) Take 1 capsule by mouth daily.   Yes Historical Provider, MD  aspirin EC 325 MG tablet Take 1 tablet (325 mg total) by mouth daily. 12/26/15   Geradine Girt, DO  omeprazole (PRILOSEC) 20 MG capsule Take 1 capsule (20 mg total) by mouth daily. 12/26/15   Geradine Girt, DO  ondansetron (ZOFRAN) 4 MG tablet Take 1 tablet (4 mg total) by mouth every 8 (eight) hours as needed for nausea or vomiting. 12/26/15   Geradine Girt, DO  oxyCODONE-acetaminophen (ROXICET) 5-325 MG tablet Take 1-2 tablets by mouth every 4 (four) hours as needed for severe pain. 12/26/15   Geradine Girt, DO     Vital Signs: BP 104/59 mmHg  Pulse 95  Temp(Src) 99.1 F (37.3 C) (Axillary)  Resp 26  Ht 5\' 7"  (1.702 m)  Wt 173 lb 15.1 oz (78.9 kg)  BMI 27.24 kg/m2  SpO2 100%  Physical Exam  Cardiovascular: Normal rate.   Pulmonary/Chest:  vent  Abdominal: Soft.  Musculoskeletal:  Rt femoral venous sheath intact Both thighs and abd swollen and firm to touch Legs and feet swollen and firm B Post tibial pulse by doppler B feet warm to touch  Skin: Skin is warm.  Nursing note and vitals reviewed.  UOP over 1 liter yesterday approx 500 cc today Cr wnl   Imaging: Dg Chest 1 View  12/25/2015  CLINICAL DATA:  Recent fall today with left hip pain, initial encounter EXAM: CHEST 1 VIEW COMPARISON:  None. FINDINGS: Cardiac shadow is within normal limits. The lungs are well aerated bilaterally. Mild interstitial changes are seen without focal infiltrate. No acute bony abnormality is seen. IMPRESSION: Mild interstitial changes likely of a chronic nature. No acute abnormality seen. Electronically Signed   By: Elta Gu[place]  Lukens M.D.   On: 12/25/2015 21:17   Dg Lumbar Spine Complete  12/25/2015  CLINICAL DATA:  Fall today with low back pain, initial encounter EXAM: LUMBAR SPINE - COMPLETE 4+ VIEW COMPARISON:  None. FINDINGS: Five lumbar type vertebral bodies are well visualized. A mild scoliosis concave to the right is noted. Reactive endplate changes are noted. No anterolisthesis is seen. No soft tissue abnormality is noted. IMPRESSION: Degenerative change without acute  abnormality. Electronically Signed   By: Inez Catalina M.D.   On: 12/25/2015 21:17   Ct Head Wo Contrast  12/28/2015  CLINICAL DATA:  75 year old female with acute respiratory distress during venous thrombolysis. EXAM: CT HEAD WITHOUT CONTRAST TECHNIQUE: Contiguous axial images were obtained from the base of the skull through the vertex without intravenous contrast. COMPARISON:  CT 08/20/2002 FINDINGS: Note that there is venous contamination given the recent abdominal CT. No large acute intracranial hemorrhage. No midline shift or mass effect. Ventricular system unremarkable. Gray-white differentiation maintained. No significant white matter disease. Hyperdense mass along the inner table of the left frontal bone measures 5 mm x 10 mm, compatible with a small meningioma without local mass effect. This was identified (by report) on the CT of 08/20/2002. Unremarkable appearance of the calvarium without acute fracture or aggressive lesion. Unremarkable appearance of the scalp soft tissues. Unremarkable appearance of the bilateral orbits. Mastoid air cells are clear. No significant paranasal sinus disease IMPRESSION: No CT evidence of acute intracranial abnormality. 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 Signed, Dulcy Fanny. Earleen Newport, DO [facility] Radiology Electronically Signed   By: Corrie Mckusick D.O.   On: 12/28/2015 21:28   Ct Angio Chest Pe W Or Wo Contrast  12/27/2015  CLINICAL DATA:  Shortness of breath. Atrial mass versus blood clot seen on cardiac echo. EXAM: CT ANGIOGRAPHY CHEST WITH CONTRAST TECHNIQUE: Multidetector CT imaging of the chest was performed using the standard protocol during bolus administration of intravenous contrast. Multiplanar CT image reconstructions and MIPs were obtained to evaluate the vascular anatomy. CONTRAST:  80 cc Isovue 370 intravenously. COMPARISON:  Chest radiograph 12/26/2015  FINDINGS: Mediastinum/Lymph Nodes: Left subclavian approach central venous catheter terminates in the superior vena cava. There are bilateral pulmonary emboli. There is a nearly completely occlusive thrombus within the left lower lobar pulmonary artery, with clot extending to the basilar segmental branches. Nonocclusive clot is seen within the left upper, lingular lobar pulmonary artery, right upper, right middle and right lower lobe pulmonary arteries. There is evidence of right heart strain with right/left ventricular ratio over 1. Irregular hypoattenuated area within the right atrium may represent mixing of non-opacified blood from the inferior vena cava with contrast opacified blood, versus an intramural thrombus. No masses or pathologically enlarged lymph nodes identified. Lungs/Pleura: There are bilateral moderate in size pleural effusions. There is segmental atelectasis of the right lower lobe and subsegmental atelectasis of the left lower lobe. Upper abdomen: Water density abdominal ascites. Musculoskeletal: No chest wall mass or suspicious bone lesions identified. Review of the MIP images confirms the above findings. IMPRESSION: Bilateral pulmonary emboli, nearly occlusive in the left lower lobe segmental branches, involving all of the segmental pulmonary arteries. Evidence of right heart strain with right/left heart ratio of over 1. Heterogeneous area of hypoattenuation within the right atrium may represent right atrial clot versus mixing of non-opacified blood from the inferior vena cava. Given presence of extensive bilateral pulmonary emboli, presence of atrial clot becomes more  plausible. If clinically important, gated cardiac CT or cardiac MRI may be pursued. Bilateral pleural effusions and atelectatic changes of bilateral lung bases. Abdominal ascites. Critical Value/emergent results were called by telephone at the time of interpretation on 12/27/2015 at 7:06 pm to Dr. Halford Chessman, who verbally acknowledged  these results. Electronically Signed   By: Fidela Salisbury M.D.   On: 12/27/2015 19:19   Ct Pelvis W Contrast  12/26/2015  CLINICAL DATA:  75 year old with large pelvic hematoma following left hip replacement. Evaluate for pelvic bleeding. EXAM: CT PELVIS WITH CONTRAST TECHNIQUE: Multidetector CT imaging of the pelvis was performed using the standard protocol following the bolus administration of intravenous contrast. CONTRAST:  100 mL Isovue COMPARISON:  None. FINDINGS: Vascular structures: The distal abdominal aorta and common iliac arteries are patent. There is active contrast extravasation in the left hemipelvis originating from the proximal left common femoral artery or distal left external iliac artery. Bleeding is originating near the origin of the left inferior epigastric artery but may be separate from this vessel. The left common femoral artery and the proximal left femoral arteries are patent. The right iliac arteries and right femoral arteries are patent. The delayed images demonstrate additional contrast extravasation within the large hematoma within the anterior pelvis and lower abdomen. There is fluid tracking into the left abdomen. There is fluid and gas tracking up the left iliacus muscle. Expected gas around the left hip from the recent hip replacement. Fluid in the pelvis. Limited evaluation of pelvic structures due to the large amount of blood. The left hip arthroplasty is located. 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 IMPRESSION: Active bleeding in the pelvis. The bleeding is originating from the proximal left common femoral artery or the distal left external iliac artery. The bleeding is near the origin of the left inferior epigastric artery. Large amount of blood and hematoma formation throughout the abdomen and pelvis. Expected postsurgical changes from left hip arthroplasty. Electronically Signed   By: Markus Daft M.D.   On: 12/26/2015 18:55   Ir Angiogram Extremity  Left  12/26/2015  INDICATION: 75 year old female with a history of a hip arthroplasty and hypotension. EXAM: IR EMBO ART VEN HEMORR LYMPH EXTRAV INC GUIDE ROADMAPPING; IR ULTRASOUND GUIDANCE VASC ACCESS RIGHT; ARTERIOGRAPHY; PELVIC SELECTIVE ARTERIOGRAPHY; LEFT EXTREMITY ARTERIOGRAPHY; ADDITIONAL ARTERIOGRAPHY MEDICATIONS: None ANESTHESIA/SEDATION: Patient was intubated. Anesthesia team managed the endotracheal airway CONTRAST:  50 cc Isovue FLUOROSCOPY TIME:  Fluoroscopy Time: 5 minutes 42 seconds COMPLICATIONS: None PROCEDURE: Informed consent was obtained from the patient following explanation of the procedure, risks, benefits and alternatives. The patient understands, agrees and consents for the procedure. All questions were addressed. A time out was performed prior to the initiation of the procedure. Maximal barrier sterile technique utilized including caps, mask, sterile gowns, sterile gloves, large sterile drape, hand hygiene, and Betadine prep. Patient positioned supine position on the fluoroscopy table. The right inguinal region was prepped and draped in the usual sterile fashion. Ultrasound survey of the right inguinal region was performed with images stored and sent to PACs. A micropuncture needle was used access the right common femoral artery under ultrasound. With excellent arterial blood flow returned, and an .018 micro wire was passed through the needle, observed enter the abdominal aorta under fluoroscopy. The needle was removed, and a micropuncture sheath was placed over the wire. The inner dilator and wire were removed, and an 035 Bentson wire was advanced under fluoroscopy into the abdominal aorta. The sheath was removed and a standard 5 [place]  vascular sheath was placed. The dilator was removed and the sheath was flushed. Omni Flush catheter was used to navigate a Bentson wire over the aortic bifurcation into the left iliac system. Catheter was exchanged for a C2 catheter advanced into the  hypogastric artery with an angiogram performed. Catheter was withdrawn and redirected into the external iliac artery. Angiogram was performed. A Rosen wire was advanced into the proximal femoral system and the catheter was withdrawn. Six French right tip sheath was advanced over the bifurcation into the left iliac system. 7 mm x 4 cm balloon was advanced over the Rosen wire into the proximal left external iliac artery. Balloon was gently inflated to low atmospheric pressure, with inflation of approximately 4 minutes. At this time, measurements of the left external iliac artery were performed. Balloon was deflated, the balloon catheter was advanced into the proximal left femoral system, and the 035 Rosen wire was exchanged for an 018 S-V8 wire. Balloon catheter was removed. A 59mm x 88mm Viabahn stent graft was select thinned and advanced over the 018 wire to the targeted the avulsed artery. Stent graft was deployed. Balloon angioplasty to the nominal diameter of 6 mm was performed. Final angiogram was performed. The 6 French 35 cm right tip sheath was exchanged for a standard 10 cm 6 French sheath. Sheath was elected to remain given the patient's coagulopathy. Patient tolerated the procedure well with no significant blood loss. IMPRESSION: Status post left lower extremity angiogram with emergent covered stent embolization of distal external iliac artery branch vessel pseudoaneurysm/avulsion, with placement of stent graft. Signed, Dulcy Fanny. Earleen Newport, DO [facility] Radiology Electronically Signed   By: Corrie Mckusick D.O.   On: 12/26/2015 22:45   Ir Angiogram Pelvis Selective Or Supraselective  12/26/2015  INDICATION: 75 year old female with a history of a hip arthroplasty and hypotension. EXAM: IR EMBO ART VEN HEMORR LYMPH EXTRAV INC GUIDE ROADMAPPING; IR ULTRASOUND GUIDANCE VASC ACCESS RIGHT; ARTERIOGRAPHY; PELVIC SELECTIVE ARTERIOGRAPHY; LEFT EXTREMITY ARTERIOGRAPHY;  ADDITIONAL ARTERIOGRAPHY MEDICATIONS: None ANESTHESIA/SEDATION: Patient was intubated. Anesthesia team managed the endotracheal airway CONTRAST:  50 cc Isovue FLUOROSCOPY TIME:  Fluoroscopy Time: 5 minutes 42 seconds COMPLICATIONS: None PROCEDURE: Informed consent was obtained from the patient following explanation of the procedure, risks, benefits and alternatives. The patient understands, agrees and consents for the procedure. All questions were addressed. A time out was performed prior to the initiation of the procedure. Maximal barrier sterile technique utilized including caps, mask, sterile gowns, sterile gloves, large sterile drape, hand hygiene, and Betadine prep. Patient positioned supine position on the fluoroscopy table. The right inguinal region was prepped and draped in the usual sterile fashion. Ultrasound survey of the right inguinal region was performed with images stored and sent to PACs. A micropuncture needle was used access the right common femoral artery under ultrasound. With excellent arterial blood flow returned, and an .018 micro wire was passed through the needle, observed enter the abdominal aorta under fluoroscopy. The needle was removed, and a micropuncture sheath was placed over the wire. The inner dilator and wire were removed, and an 035 Bentson wire was advanced under fluoroscopy into the abdominal aorta. The sheath was removed and a standard 5 [place] vascular sheath was placed. The dilator was removed and the sheath was flushed. Omni Flush catheter was used to navigate a Bentson wire over the aortic bifurcation into the left iliac system. Catheter was exchanged for a C2 catheter advanced into the hypogastric artery with an angiogram performed. Catheter  was withdrawn and redirected into the external iliac artery. Angiogram was performed. A Rosen wire was advanced into the proximal femoral system and the catheter was withdrawn. Six French right tip sheath was advanced over the  bifurcation into the left iliac system. 7 mm x 4 cm balloon was advanced over the Rosen wire into the proximal left external iliac artery. Balloon was gently inflated to low atmospheric pressure, with inflation of approximately 4 minutes. At this time, measurements of the left external iliac artery were performed. Balloon was deflated, the balloon catheter was advanced into the proximal left femoral system, and the 035 Rosen wire was exchanged for an 018 S-V8 wire. Balloon catheter was removed. A 53mm x 33mm Viabahn stent graft was select thinned and advanced over the 018 wire to the targeted the avulsed artery. Stent graft was deployed. Balloon angioplasty to the nominal diameter of 6 mm was performed. Final angiogram was performed. The 6 French 35 cm right tip sheath was exchanged for a standard 10 cm 6 French sheath. Sheath was elected to remain given the patient's coagulopathy. Patient tolerated the procedure well with no significant blood loss. IMPRESSION: Status post left lower extremity angiogram with emergent covered stent embolization of distal external iliac artery branch vessel pseudoaneurysm/avulsion, with placement of stent graft. Signed, Dulcy Fanny. Earleen Newport, DO [facility] Radiology Electronically Signed   By: Corrie Mckusick D.O.   On: 12/26/2015 22:45   Ir Angiogram Selective Each Additional Vessel  12/26/2015  INDICATION: 75 year old female with a history of a hip arthroplasty and hypotension. EXAM: IR EMBO ART VEN HEMORR LYMPH EXTRAV INC GUIDE ROADMAPPING; IR ULTRASOUND GUIDANCE VASC ACCESS RIGHT; ARTERIOGRAPHY; PELVIC SELECTIVE ARTERIOGRAPHY; LEFT EXTREMITY ARTERIOGRAPHY; ADDITIONAL ARTERIOGRAPHY MEDICATIONS: None ANESTHESIA/SEDATION: Patient was intubated. Anesthesia team managed the endotracheal airway CONTRAST:  50 cc Isovue FLUOROSCOPY TIME:  Fluoroscopy Time: 5 minutes 42 seconds COMPLICATIONS: None PROCEDURE: Informed consent was obtained from the  patient following explanation of the procedure, risks, benefits and alternatives. The patient understands, agrees and consents for the procedure. All questions were addressed. A time out was performed prior to the initiation of the procedure. Maximal barrier sterile technique utilized including caps, mask, sterile gowns, sterile gloves, large sterile drape, hand hygiene, and Betadine prep. Patient positioned supine position on the fluoroscopy table. The right inguinal region was prepped and draped in the usual sterile fashion. Ultrasound survey of the right inguinal region was performed with images stored and sent to PACs. A micropuncture needle was used access the right common femoral artery under ultrasound. With excellent arterial blood flow returned, and an .018 micro wire was passed through the needle, observed enter the abdominal aorta under fluoroscopy. The needle was removed, and a micropuncture sheath was placed over the wire. The inner dilator and wire were removed, and an 035 Bentson wire was advanced under fluoroscopy into the abdominal aorta. The sheath was removed and a standard 5 [place] vascular sheath was placed. The dilator was removed and the sheath was flushed. Omni Flush catheter was used to navigate a Bentson wire over the aortic bifurcation into the left iliac system. Catheter was exchanged for a C2 catheter advanced into the hypogastric artery with an angiogram performed. Catheter was withdrawn and redirected into the external iliac artery. Angiogram was performed. A Rosen wire was advanced into the proximal femoral system and the catheter was withdrawn. Six French right tip sheath was advanced over the bifurcation into the left iliac system. 7 mm x 4 cm balloon was  advanced over the [facility] wire into the proximal left external iliac artery. Balloon was gently inflated to low atmospheric pressure, with inflation of approximately 4 minutes. At this time, measurements of the left external iliac  artery were performed. Balloon was deflated, the balloon catheter was advanced into the proximal left femoral system, and the 035 Rosen wire was exchanged for an 018 S-V8 wire. Balloon catheter was removed. A 27mm x 78mm Viabahn stent graft was select thinned and advanced over the 018 wire to the targeted the avulsed artery. Stent graft was deployed. Balloon angioplasty to the nominal diameter of 6 mm was performed. Final angiogram was performed. The 6 French 35 cm right tip sheath was exchanged for a standard 10 cm 6 French sheath. Sheath was elected to remain given the patient's coagulopathy. Patient tolerated the procedure well with no significant blood loss. IMPRESSION: Status post left lower extremity angiogram with emergent covered stent embolization of distal external iliac artery branch vessel pseudoaneurysm/avulsion, with placement of stent graft. Signed, Dulcy Fanny. Earleen Newport, DO [facility] Radiology Electronically Signed   By: Corrie Mckusick D.O.   On: 12/26/2015 22:45   Ir Mariana Arn Ivc  12/28/2015  INDICATION: 75 year old female, status post postoperative hemorrhage left external iliac artery branch vessel, with embolization for hemostasis. Subsequent development of respiratory decompensation with cardiac echo demonstrating giant atrial thrombus. Surgical embolectomy carries a high morbidity/ mortality given the patient's current ICU status. Alternative treatment options include catheter embolectomy/thrombectomy, as well as catheter directed thrombolysis. Family has elected to proceed with least invasive therapy. EXAM: IR INFUSION THROMBOL ARTERIAL INITIAL (MS); IR ULTRASOUND GUIDANCE VASC ACCESS RIGHT; INFERIOR VENA CAVOGRAM COMPARISON:  CT pelvis 12/26/2015 CT chest 12/27/2015 Cardiac ECHO 12/27/2015 and 12/28/2015 MEDICATIONS: None. ANESTHESIA/SEDATION: Versed 0.5 mg IV; Fentanyl 0 mcg IV No moderate sedation The patient was continuously monitored  during the procedure by the interventional radiology nurse under my direct supervision. FLUOROSCOPY TIME:  Fluoroscopy Time: 0 minutes 36 seconds (58 mGy). COMPLICATIONS: None TECHNIQUE: Informed written consent was obtained from the patient and the patient's husband after a thorough discussion of the procedural risks, benefits and alternatives. All questions were addressed. Maximal Sterile Barrier Technique was utilized including caps, mask, sterile gowns, sterile gloves, sterile drape, hand hygiene and skin antiseptic. A timeout was performed prior to the initiation of the procedure. Patient positioned supine position on the fluoroscopy table. The right inguinal region was prepped and draped in the usual sterile fashion. Ultrasound survey of the right inguinal region was performed with images stored and sent to PACs. A single wall 19 gauge needle was used access the right common femoral vein under ultrasound. With excellent venous blood flow returned, an 035 Bentson wire was passed through the needle, observed enter the abdominal IVC under fluoroscopy. The needle was removed, and a standard 5 [place] vascular sheath was placed. The dilator was removed and the sheath was flushed. Limited venogram was then performed of the right iliac vein and the IVC. A stiff Glidewire was then advanced to the inferior cavoatrial junction. A 20 cm infusion length Uni fuse catheter was then placed at the suprarenal IVC, with the tip terminating in the inferior right atrium. Final image was stored. Patient tolerated the procedure well and remained hemodynamically unchanged. No blood loss. FINDINGS: Ultrasound survey demonstrates patent right common femoral vein. Limited venogram of the right iliac vein and IVC demonstrates no thrombus of the visualized right iliac system. Venous reflux into the left iliac system with slow flow  through the IVC. No occlusive thrombus of the infrarenal or suprarenal IVC. There is poor definition of the  known right atrial thrombus given the low volume injection. IMPRESSION: Status post right common femoral vein access for placement of thrombolysis catheter and initiation of pharmacologic thrombolysis to treat known right atrial thrombus. Signed, Dulcy Fanny. Earleen Newport, DO [facility] Radiology Electronically Signed   By: Corrie Mckusick D.O.   On: 12/28/2015 16:56   Ir Angiogram Follow Up Study  12/26/2015  INDICATION: 75 year old female with a history of a hip arthroplasty and hypotension. EXAM: IR EMBO ART VEN HEMORR LYMPH EXTRAV INC GUIDE ROADMAPPING; IR ULTRASOUND GUIDANCE VASC ACCESS RIGHT; ARTERIOGRAPHY; PELVIC SELECTIVE ARTERIOGRAPHY; LEFT EXTREMITY ARTERIOGRAPHY; ADDITIONAL ARTERIOGRAPHY MEDICATIONS: None ANESTHESIA/SEDATION: Patient was intubated. Anesthesia team managed the endotracheal airway CONTRAST:  50 cc Isovue FLUOROSCOPY TIME:  Fluoroscopy Time: 5 minutes 42 seconds COMPLICATIONS: None PROCEDURE: Informed consent was obtained from the patient following explanation of the procedure, risks, benefits and alternatives. The patient understands, agrees and consents for the procedure. All questions were addressed. A time out was performed prior to the initiation of the procedure. Maximal barrier sterile technique utilized including caps, mask, sterile gowns, sterile gloves, large sterile drape, hand hygiene, and Betadine prep. Patient positioned supine position on the fluoroscopy table. The right inguinal region was prepped and draped in the usual sterile fashion. Ultrasound survey of the right inguinal region was performed with images stored and sent to PACs. A micropuncture needle was used access the right common femoral artery under ultrasound. With excellent arterial blood flow returned, and an .018 micro wire was passed through the needle, observed enter the abdominal aorta under fluoroscopy. The needle was removed, and a micropuncture sheath was placed  over the wire. The inner dilator and wire were removed, and an 035 Bentson wire was advanced under fluoroscopy into the abdominal aorta. The sheath was removed and a standard 5 [place] vascular sheath was placed. The dilator was removed and the sheath was flushed. Omni Flush catheter was used to navigate a Bentson wire over the aortic bifurcation into the left iliac system. Catheter was exchanged for a C2 catheter advanced into the hypogastric artery with an angiogram performed. Catheter was withdrawn and redirected into the external iliac artery. Angiogram was performed. A Rosen wire was advanced into the proximal femoral system and the catheter was withdrawn. Six French right tip sheath was advanced over the bifurcation into the left iliac system. 7 mm x 4 cm balloon was advanced over the Rosen wire into the proximal left external iliac artery. Balloon was gently inflated to low atmospheric pressure, with inflation of approximately 4 minutes. At this time, measurements of the left external iliac artery were performed. Balloon was deflated, the balloon catheter was advanced into the proximal left femoral system, and the 035 Rosen wire was exchanged for an 018 S-V8 wire. Balloon catheter was removed. A 66mm x 46mm Viabahn stent graft was select thinned and advanced over the 018 wire to the targeted the avulsed artery. Stent graft was deployed. Balloon angioplasty to the nominal diameter of 6 mm was performed. Final angiogram was performed. The 6 French 35 cm right tip sheath was exchanged for a standard 10 cm 6 French sheath. Sheath was elected to remain given the patient's coagulopathy. Patient tolerated the procedure well with no significant blood loss. IMPRESSION: Status post left lower extremity angiogram with emergent covered stent embolization of distal external iliac artery branch vessel pseudoaneurysm/avulsion, with placement of stent  graft. Signed, Dulcy Fanny. Earleen Newport, DO Vascular and Interventional Radiology  Specialists [facility] Radiology Electronically Signed   By: Corrie Mckusick D.O.   On: 12/26/2015 22:45   Ir US Guide Vasc Access Right  12/28/2015  INDICATION: 76 year old female, status post postoperative hemorrhage left external iliac artery branch vessel, with embolization for hemostasis. Subsequent development of respiratory decompensation with cardiac echo demonstrating giant atrial thrombus. Surgical embolectomy carries a high morbidity/ mortality given the patient's current ICU status. Alternative treatment options include catheter embolectomy/thrombectomy, as well as catheter directed thrombolysis. Family has elected to proceed with least invasive therapy. EXAM: IR INFUSION THROMBOL ARTERIAL INITIAL (MS); IR ULTRASOUND GUIDANCE VASC ACCESS RIGHT; INFERIOR VENA CAVOGRAM COMPARISON:  CT pelvis 12/26/2015 CT chest 12/27/2015 Cardiac ECHO 12/27/2015 and 12/28/2015 MEDICATIONS: None. ANESTHESIA/SEDATION: Versed 0.5 mg IV; Fentanyl 0 mcg IV No moderate sedation The patient was continuously monitored during the procedure by the interventional radiology nurse under my direct supervision. FLUOROSCOPY TIME:  Fluoroscopy Time: 0 minutes 36 seconds (58 mGy). COMPLICATIONS: None TECHNIQUE: Informed written consent was obtained from the patient and the patient's husband after a thorough discussion of the procedural risks, benefits and alternatives. All questions were addressed. Maximal Sterile Barrier Technique was utilized including caps, mask, sterile gowns, sterile gloves, sterile drape, hand hygiene and skin antiseptic. A timeout was performed prior to the initiation of the procedure. Patient positioned supine position on the fluoroscopy table. The right inguinal region was prepped and draped in the usual sterile fashion. Ultrasound survey of the right inguinal region was performed with images stored and sent to PACs. A single wall 19 gauge needle was used access the right common femoral vein under ultrasound. With  excellent venous blood flow returned, an 035 Bentson wire was passed through the needle, observed enter the abdominal IVC under fluoroscopy. The needle was removed, and a standard 5 [place] vascular sheath was placed. The dilator was removed and the sheath was flushed. Limited venogram was then performed of the right iliac vein and the IVC. A stiff Glidewire was then advanced to the inferior cavoatrial junction. A 20 cm infusion length Uni fuse catheter was then placed at the suprarenal IVC, with the tip terminating in the inferior right atrium. Final image was stored. Patient tolerated the procedure well and remained hemodynamically unchanged. No blood loss. FINDINGS: Ultrasound survey demonstrates patent right common femoral vein. Limited venogram of the right iliac vein and IVC demonstrates no thrombus of the visualized right iliac system. Venous reflux into the left iliac system with slow flow through the IVC. No occlusive thrombus of the infrarenal or suprarenal IVC. There is poor definition of the known right atrial thrombus given the low volume injection. IMPRESSION: Status post right common femoral vein access for placement of thrombolysis catheter and initiation of pharmacologic thrombolysis to treat known right atrial thrombus. Signed, Dulcy Fanny. Earleen Newport, DO Vascular and Interventional Radiology Specialists [facility] Radiology Electronically Signed   By: Corrie Mckusick D.O.   On: 12/28/2015 16:56   Ir US Guide Vasc Access Right  12/26/2015  INDICATION: 75 year old female with a history of a hip arthroplasty and hypotension. EXAM: IR EMBO ART VEN HEMORR LYMPH EXTRAV INC GUIDE ROADMAPPING; IR ULTRASOUND GUIDANCE VASC ACCESS RIGHT; ARTERIOGRAPHY; PELVIC SELECTIVE ARTERIOGRAPHY; LEFT EXTREMITY ARTERIOGRAPHY; ADDITIONAL ARTERIOGRAPHY MEDICATIONS: None ANESTHESIA/SEDATION: Patient was intubated. Anesthesia team managed the endotracheal airway CONTRAST:  50 cc Isovue FLUOROSCOPY TIME:  Fluoroscopy Time: 5 minutes  42 seconds COMPLICATIONS: None PROCEDURE: Informed consent was obtained from the patient following explanation of the  procedure, risks, benefits and alternatives. The patient understands, agrees and consents for the procedure. All questions were addressed. A time out was performed prior to the initiation of the procedure. Maximal barrier sterile technique utilized including caps, mask, sterile gowns, sterile gloves, large sterile drape, hand hygiene, and Betadine prep. Patient positioned supine position on the fluoroscopy table. The right inguinal region was prepped and draped in the usual sterile fashion. Ultrasound survey of the right inguinal region was performed with images stored and sent to PACs. A micropuncture needle was used access the right common femoral artery under ultrasound. With excellent arterial blood flow returned, and an .018 micro wire was passed through the needle, observed enter the abdominal aorta under fluoroscopy. The needle was removed, and a micropuncture sheath was placed over the wire. The inner dilator and wire were removed, and an 035 Bentson wire was advanced under fluoroscopy into the abdominal aorta. The sheath was removed and a standard 5 [place] vascular sheath was placed. The dilator was removed and the sheath was flushed. Omni Flush catheter was used to navigate a Bentson wire over the aortic bifurcation into the left iliac system. Catheter was exchanged for a C2 catheter advanced into the hypogastric artery with an angiogram performed. Catheter was withdrawn and redirected into the external iliac artery. Angiogram was performed. A Rosen wire was advanced into the proximal femoral system and the catheter was withdrawn. Six French right tip sheath was advanced over the bifurcation into the left iliac system. 7 mm x 4 cm balloon was advanced over the Rosen wire into the proximal left external iliac artery. Balloon was gently inflated to low atmospheric pressure, with inflation of  approximately 4 minutes. At this time, measurements of the left external iliac artery were performed. Balloon was deflated, the balloon catheter was advanced into the proximal left femoral system, and the 035 Rosen wire was exchanged for an 018 S-V8 wire. Balloon catheter was removed. A 35mm x 69mm Viabahn stent graft was select thinned and advanced over the 018 wire to the targeted the avulsed artery. Stent graft was deployed. Balloon angioplasty to the nominal diameter of 6 mm was performed. Final angiogram was performed. The 6 French 35 cm right tip sheath was exchanged for a standard 10 cm 6 French sheath. Sheath was elected to remain given the patient's coagulopathy. Patient tolerated the procedure well with no significant blood loss. IMPRESSION: Status post left lower extremity angiogram with emergent covered stent embolization of distal external iliac artery branch vessel pseudoaneurysm/avulsion, with placement of stent graft. Signed, Dulcy Fanny. Earleen Newport, DO Vascular and Interventional Radiology Specialists [facility] Radiology Electronically Signed   By: Corrie Mckusick D.O.   On: 12/26/2015 22:45   Dg Chest Port 1 View  12/29/2015  CLINICAL DATA:  75 year old female with a history of left hip replacement, postoperative hemorrhage and subsequent embolization, and development of pulmonary emboli. 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 EXAM: PORTABLE CHEST 1 VIEW COMPARISON:  Multiple prior FINDINGS: Cardiomediastinal silhouette unchanged in size and contour. Similar position of defibrillator pads on the chest wall. Unchanged position of the endotracheal tube, which terminates suitably above the carina approximately 3.4 cm. Gastric tube unchanged projecting over the mediastinum and terminating and out of the field of view. Unchanged position of left subclavian central catheter which terminates in the superior vena cava. Opacities at the bilateral lung bases  partially obscuring the hemidiaphragms and the heart borders. Veiled opacity in the lower lungs. Mixed interstitial and  airspace opacities in the mid and lower lobes. No pneumothorax. No displaced rib fracture. IMPRESSION: Opacities the bilateral lung bases, likely a combination of pleural effusion and atelectasis/ consolidation. Unchanged position of support apparatus, including defibrillator pads, gastric tube, left subclavian central venous catheter, and endotracheal tube which terminates suitably above the carina. Signed, Dulcy Fanny. Earleen Newport, DO Vascular and Interventional Radiology Specialists [facility] Radiology Electronically Signed   By: Corrie Mckusick D.O.   On: 12/29/2015 07:05   Dg Chest Port 1 View  12/28/2015  CLINICAL DATA:  Evaluate endotracheal and orogastric tube placement. Acute respiratory failure. EXAM: PORTABLE CHEST 1 VIEW COMPARISON:  Chest radiograph December 28, 2015 at 0916 hours FINDINGS: Endotracheal tube tip projects 4.8 cm above the carina. Nasogastric tube past proximal stomach, not imaged. Nasogastric tube side port projects in proximal stomach. Stable appearance of LEFT subclavian central venous catheter with distal tip projecting in mid superior vena cava. Similar moderate RIGHT pleural effusion. Bibasilar strandy densities. Biapical pleural capping. No pneumothorax. Soft tissue planes and included osseous structures are nonsuspicious. IMPRESSION: Endotracheal tube tip projects 4.8 cm above the carina. Nasogastric tube past proximal stomach. No change in LEFT subclavian central venous catheter. Moderate RIGHT pleural effusion with bibasilar atelectasis. Electronically Signed   By: Elon Alas M.D.   On: 12/28/2015 19:38   Dg Chest Port 1 View  12/28/2015  CLINICAL DATA:  Hypoxia.  Recent diagnosis of pulmonary embolism. EXAM: PORTABLE CHEST 1 VIEW COMPARISON:  Chest CT 12/27/2015. FINDINGS: Central venous catheter tip projects over the superior vena cava. Monitoring leads  project over the patient. Stable cardiac and mediastinal contours. Moderate layering bilateral pleural effusions and underlying opacities within the mid lower lungs bilaterally. No pneumothorax. IMPRESSION: Moderate layering bilateral pleural effusions with underlying opacities which may represent atelectasis. Electronically Signed   By: Lovey Newcomer M.D.   On: 12/28/2015 10:53   Dg Chest Port 1 View  12/26/2015  CLINICAL DATA:  Patient status post ET tube placement. EXAM: PORTABLE CHEST 1 VIEW COMPARISON:  Chest radiograph 12/25/2015. FINDINGS: ET tube terminates in the mid trachea. Left subclavian central venous catheter tip projects over the superior vena cava. Enteric tube tip and side port terminates in the midesophagus, recommend advancement. Stable cardiac and mediastinal contours. Low lung volumes. Elevation of the right hemidiaphragm. Right-greater-than-left interstitial pulmonary opacities. No large pleural effusion or pneumothorax. IMPRESSION: ET tube tip and side port terminate in the midesophagus, recommend advancement. Bilateral, left-greater-than-right, interstitial opacities which may represent pulmonary edema. These results will be called to the ordering clinician or representative by the Radiologist Assistant, and communication documented in the PACS or zVision Dashboard. Electronically Signed   By: Lovey Newcomer M.D.   On: 12/26/2015 21:07   Dg Hip Operative Unilat W Or W/o Pelvis Left  12/26/2015  CLINICAL DATA:  Left anterior hip replacement due to fracture. EXAM: OPERATIVE LEFT HIP (WITH PELVIS IF PERFORMED) 1 VIEW TECHNIQUE: Fluoroscopic spot image(s) were submitted for interpretation post-operatively. COMPARISON:  12/25/2015 FINDINGS: A total left hip arthroplasty has been performed. Alignment of the prosthesis is grossly normal on this single view. No evidence for a periprosthetic fracture. IMPRESSION: Left hip replacement without complicating features. Electronically Signed   By: Markus Daft M.D.   On: 12/26/2015 16:34   Dg Hip Unilat With Pelvis 2-3 Views Left  12/25/2015  CLINICAL DATA:  Recent fall with hip pain, initial encounter EXAM: DG HIP (WITH OR WITHOUT PELVIS) 2-3V LEFT COMPARISON:  None. FINDINGS: There is left femoral neck fracture  with impaction and angulation at the fracture site. Pelvic ring is intact. No other focal abnormality is seen. IMPRESSION: Left femoral neck fracture. Electronically Signed   By: Inez Catalina M.D.   On: 12/25/2015 21:16   Dg Femur 1v Left  12/25/2015  CLINICAL DATA:  [place] with left hip pain. EXAM: LEFT FEMUR 1 VIEW COMPARISON:  Left hip 12/25/2015 FINDINGS: Single view of the left femur was obtained. There is a fracture involving the proximal left femur at the junction of the femoral head and neck. There is superior displacement of the left femoral neck. Findings are suggestive for a subcapital hip fracture. The mid and distal femur appear to be intact on this single view. IMPRESSION: Fracture of the proximal left femur. Findings are compatible with a subcapital hip fracture. Electronically Signed   By: Markus Daft M.D.   On: 12/25/2015 21:17   [place] Guide Roadmapping  12/26/2015  INDICATION: 75 year old female with a history of a hip arthroplasty and hypotension. EXAM: IR EMBO ART VEN HEMORR LYMPH EXTRAV INC GUIDE ROADMAPPING; IR ULTRASOUND GUIDANCE VASC ACCESS RIGHT; ARTERIOGRAPHY; PELVIC SELECTIVE ARTERIOGRAPHY; LEFT EXTREMITY ARTERIOGRAPHY; ADDITIONAL ARTERIOGRAPHY MEDICATIONS: None ANESTHESIA/SEDATION: Patient was intubated. Anesthesia team managed the endotracheal airway CONTRAST:  50 cc Isovue FLUOROSCOPY TIME:  Fluoroscopy Time: 5 minutes 42 seconds COMPLICATIONS: None PROCEDURE: Informed consent was obtained from the patient following explanation of the procedure, risks, benefits and alternatives. The patient understands, agrees and consents for the procedure. All questions were addressed. A time out was  performed prior to the initiation of the procedure. Maximal barrier sterile technique utilized including caps, mask, sterile gowns, sterile gloves, large sterile drape, hand hygiene, and Betadine prep. Patient positioned supine position on the fluoroscopy table. The right inguinal region was prepped and draped in the usual sterile fashion. Ultrasound survey of the right inguinal region was performed with images stored and sent to PACs. A micropuncture needle was used access the right common femoral artery under ultrasound. With excellent arterial blood flow returned, and an .018 micro wire was passed through the needle, observed enter the abdominal aorta under fluoroscopy. The needle was removed, and a micropuncture sheath was placed over the wire. The inner dilator and wire were removed, and an 035 Bentson wire was advanced under fluoroscopy into the abdominal aorta. The sheath was removed and a standard 5 [place] vascular sheath was placed. The dilator was removed and the sheath was flushed. Omni Flush catheter was used to navigate a Bentson wire over the aortic bifurcation into the left iliac system. Catheter was exchanged for a C2 catheter advanced into the hypogastric artery with an angiogram performed. Catheter was withdrawn and redirected into the external iliac artery. Angiogram was performed. A Rosen wire was advanced into the proximal femoral system and the catheter was withdrawn. Six French right tip sheath was advanced over the bifurcation into the left iliac system. 7 mm x 4 cm balloon was advanced over the Rosen wire into the proximal left external iliac artery. Balloon was gently inflated to low atmospheric pressure, with inflation of approximately 4 minutes. At this time, measurements of the left external iliac artery were performed. Balloon was deflated, the balloon catheter was advanced into the proximal left femoral system, and the 035 Rosen wire was exchanged for an 018 S-V8 wire. Balloon catheter  was removed. A 76mm x 52mm Viabahn stent graft was select thinned and advanced over the 018 wire to the targeted the avulsed artery.  Stent graft was deployed. Balloon angioplasty to the nominal diameter of 6 mm was performed. Final angiogram was performed. The 6 French 35 cm right tip sheath was exchanged for a standard 10 cm 6 French sheath. Sheath was elected to remain given the patient's coagulopathy. Patient tolerated the procedure well with no significant blood loss. IMPRESSION: Status post left lower extremity angiogram with emergent covered stent embolization of distal external iliac artery branch vessel pseudoaneurysm/avulsion, with placement of stent graft. Signed, Dulcy Fanny. Earleen Newport, DO Vascular and Interventional Radiology Specialists [facility] Radiology Electronically Signed   By: Corrie Mckusick D.O.   On: 12/26/2015 22:45   Ct Angio Abd/pel W/ And/or W/o  12/28/2015  CLINICAL DATA:  75 year old female with left hip arthroplasty and postoperative hemorrhage. Discovery of rate atrial thrombus, with respiratory compromise. Status post initiation of catheter directed thrombolysis of atrial thrombus via right common femoral vein approach. EXAM: CT ANGIOGRAPHY ABDOMEN AND PELVIS TECHNIQUE: Multidetector CT imaging through the abdomen and pelvis was performed using the standard protocol during bolus administration of intravenous contrast. Multiplanar reconstructed images including MIPs were obtained and reviewed to evaluate the vascular anatomy. CONTRAST:  100 cc Isovue 370 COMPARISON:  CT chest 12/27/2015, CT pelvis 12/26/2015 FINDINGS: LOWER CHEST: Nonvascular: Unremarkable appearance of the superficial soft tissues the chest. Heart size within normal limits. Tip of left subclavian central venous catheter partially visualized terminating in the superior vena cava. Tip of the lower extremity lytic catheter terminates in the right atrium. Questionable hypoperfusion of the myocardium. Small bilateral pleural  effusions with associated atelectasis. Gastric tube continues through the esophagus terminating in the stomach. Aeration of the bilateral lower lobes improved compared to the prior CT, status post intubation with positive pressure ventilation. Endotracheal tube terminates above the carina. Vascular: Normal course caliber and contour of the thoracic aorta. Minimal atherosclerotic changes. No dissection or aneurysm. No ulceration. Timing is not optimized for the evaluation of the pulmonary arteries, however, there is no central, lobar filling defects. There are a few visualized segmental/subsegmental filling defects in the distribution of the comparison CT, with improved thrombus burden of the bilateral lower lobes. There is no large filling defect identified within the right atrium on the CT. ABDOMEN PELVIS: Nonvascular: Intermediate density fluid layered in the bilateral subdiaphragmatic space tracking along the bilateral pericolic gutter. Mixed density fluid in the pelvis, predominantly extraperitoneal (within the space of Retz[place]. Overall volume appears decreased from the comparison CT of the pelvis performed 12/26/2015 largest component on today's study measures approximately 16.3 cm by 11 cm, compared to 15.5 cm x 15 cm. Unremarkable appearance of liver, spleen. Unremarkable appearance of the pancreas. Unremarkable appearance of the bilateral adrenal glands. No evidence of hydronephrosis. Unremarkable course of the bilateral ureters, which are partially opacified with contrast. High density material within the gallbladder, compatible with vicarious excretion of contrast. Urinary bladder decompressed with catheter in position. Vascular: Normal course caliber and contour of the abdominal aorta. Scattered calcifications present. No aneurysm or dissection flap. No periaortic fluid. Celiac artery and superior mesenteric artery are patent. Bilateral renal arteries patent with no significant stenosis. Inferior  mesenteric artery patent. Right lower extremity: Normal course caliber and contour of the right external iliac artery without aneurysm or dissection flap. No active extravasation or evidence of arterial injury. Right hypogastric arteries patent including anterior and posterior branches. Proximal right femoral arteries patent. Left lower extremity: Normal course caliber and contour of the left common iliac artery. No aneurysm or dissection. No occlusion. Hypogastric arteries patent including  anterior posterior branches. No evidence of active extravasation or contrast pooling involving the hypogastric territory. External iliac artery is patent. Vascular stent in place at the site of previously identified injury. Stent terminates at the level of the inguinal ligament. Proximal left femoral arteries unremarkable with no active extravasation. There is enlarging hematoma of the proximal left thigh compared to the prior pelvis CT. Hematoma measures approximately 10.5 cm in greatest diameter, increased from 9.3 cm on the prior. Hematoma centered within the anterior and lateral rectus musculature. There is a tiny linear hyperdensity centered within the hematoma on the lateral aspect, potentially site of hemorrhage. No significant pooling on the post contrast images. Unremarkable appearance of the venous system. Venous catheter present within the right femoral system, traversing the IVC. The left external iliac and common iliac vein are compressed by a pelvic hematoma. Musculoskeletal: Surgical changes of left hip arthroplasty. Mild degenerative changes of the spine. No displaced fracture is identified. Review of the MIP images confirms the above findings. IMPRESSION: No evidence of new intraperitoneal or pelvic hemorrhage to account for new back pain. Re- demonstration of extraperitoneal and intraperitoneal hemorrhage, with decreasing size of the largest component in the space of Retzius and within the extraperitoneal  pelvis. There is increasing hematoma in the left proximal thigh, with dense focus centered within the musculature which may indicate venous bleeding or small arterial bleeding. This detail was discussed with the managing physicians at the time of the CT scan. Patent left external iliac stent, status post embolization. Questionable hypodensity of the left ventricle myocardium, potentially indicating hypoperfusion. Compared to the prior CT of the chest, there is decreasing appreciable volume of pulmonary embolus in the bilateral lower lobes. No large right atrial filling defect identified on the delayed images. Small bilateral pleural effusions with associated atelectasis. Signed, Dulcy Fanny. Earleen Newport, DO [facility] Radiology Electronically Signed   By: Corrie Mckusick D.O.   On: 12/28/2015 21:10   Ir Infusion Thrombol Venous Initial (ms)  12/28/2015  INDICATION: 75 year old female, status post postoperative hemorrhage left external iliac artery branch vessel, with embolization for hemostasis. Subsequent development of respiratory decompensation with cardiac echo demonstrating giant atrial thrombus. Surgical embolectomy carries a high morbidity/ mortality given the patient's current ICU status. Alternative treatment options include catheter embolectomy/thrombectomy, as well as catheter directed thrombolysis. Family has elected to proceed with least invasive therapy. EXAM: IR INFUSION THROMBOL ARTERIAL INITIAL (MS); IR ULTRASOUND GUIDANCE VASC ACCESS RIGHT; INFERIOR VENA CAVOGRAM COMPARISON:  CT pelvis 12/26/2015 CT chest 12/27/2015 Cardiac ECHO 12/27/2015 and 12/28/2015 MEDICATIONS: None. ANESTHESIA/SEDATION: Versed 0.5 mg IV; Fentanyl 0 mcg IV No moderate sedation The patient was continuously monitored during the procedure by the interventional radiology nurse under my direct supervision. FLUOROSCOPY TIME:  Fluoroscopy Time: 0 minutes 36 seconds (58 mGy). COMPLICATIONS:  None TECHNIQUE: Informed written consent was obtained from the patient and the patient's husband after a thorough discussion of the procedural risks, benefits and alternatives. All questions were addressed. Maximal Sterile Barrier Technique was utilized including caps, mask, sterile gowns, sterile gloves, sterile drape, hand hygiene and skin antiseptic. A timeout was performed prior to the initiation of the procedure. Patient positioned supine position on the fluoroscopy table. The right inguinal region was prepped and draped in the usual sterile fashion. Ultrasound survey of the right inguinal region was performed with images stored and sent to PACs. A single wall 19 gauge needle was used access the right common femoral vein under ultrasound. With excellent venous blood flow  returned, an [place] wire was passed through the needle, observed enter the abdominal IVC under fluoroscopy. The needle was removed, and a standard 5 [place] vascular sheath was placed. The dilator was removed and the sheath was flushed. Limited venogram was then performed of the right iliac vein and the IVC. A stiff Glidewire was then advanced to the inferior cavoatrial junction. A 20 cm infusion length Uni fuse catheter was then placed at the suprarenal IVC, with the tip terminating in the inferior right atrium. Final image was stored. Patient tolerated the procedure well and remained hemodynamically unchanged. No blood loss. FINDINGS: Ultrasound survey demonstrates patent right common femoral vein. Limited venogram of the right iliac vein and IVC demonstrates no thrombus of the visualized right iliac system. Venous reflux into the left iliac system with slow flow through the IVC. No occlusive thrombus of the infrarenal or suprarenal IVC. There is poor definition of the known right atrial thrombus given the low volume injection. IMPRESSION: Status post right common femoral vein access for placement of thrombolysis catheter and initiation  of pharmacologic thrombolysis to treat known right atrial thrombus. Signed, Dulcy Fanny. Earleen Newport, DO [facility] Radiology Electronically Signed   By: Corrie Mckusick D.O.   On: 12/28/2015 16:56    Labs:  CBC:  Recent Labs  12/28/15 1825 12/28/15 2115 12/29/15 0306 12/29/15 1034  WBC 11.1* 13.7* 13.0* 15.9*  HGB 8.3* 10.2* 9.0* 8.0*  HCT 24.8* 30.3* 26.2* 23.4*  PLT 81* 72* 64* 55*    COAGS:  Recent Labs  12/25/15 2013 12/26/15 1740 12/27/15 12/27/15 0400  INR 1.13 2.01* 1.35 1.37  APTT  --  29  --   --     BMP:  Recent Labs  12/27/15 12/27/15 0400 12/28/15 0038 12/29/15 0306  NA 138 140 140 143  K 3.6 3.6 3.8 3.3*  CL 108 110 110 116*  CO2 22 22 23  20*  GLUCOSE 196* 145* 133* 134*  BUN 12 14 15 20   CALCIUM 8.9 8.4* 8.2* 7.6*  CREATININE 0.75 0.78 0.84 0.88  GFRNONAA >60 >60 >60 >60  GFRAA >60 >60 >60 >60    LIVER FUNCTION TESTS:  Recent Labs  12/27/15 12/27/15 0400 12/29/15 0306  BILITOT 1.2 1.1 0.6  AST 51* 45* 52*  ALT 28 25 21   ALKPHOS 66 59 48  PROT 6.0* 5.6* 4.7*  ALBUMIN 3.5 3.1* 2.5*    Assessment and Plan:  Will follow pt with CCM Rt femoral venous sheath intact Was left in place per request Can remove if not using----if need IR to do so please call [phone]  Electronically Signed: Eldin Bonsell A 12/29/2015, 2:39 PM   I spent a total of 25 Minutes at the the patient's bedside AND on the patient's hospital floor or unit, greater than 50% of which was counseling/coordinating care for left iliac artery stent; right atrial thrombus thrombolysis

## 2015-12-29 NOTE — Progress Notes (Addendum)
ANTICOAGULATION CONSULT NOTE  Pharmacy Consult for Heparin Indication: pulmonary embolus and and history of atrial fibrillation  No Known Allergies  Patient Measurements: Height: 5\' 7"  (170.2 cm) Weight: 173 lb 15.1 oz (78.9 kg) IBW/kg (Calculated) : 61.6 Heparin Dosing Weight: 77 kg  Vital Signs: Temp: 97.8 F (36.6 C) (06/19 1524) Temp Source: Oral (06/19 1524) BP: 129/56 mmHg (06/19 1651) Pulse Rate: 93 (06/19 1651)  Labs:  Recent Labs  12/26/15 1740  12/27/15 12/27/15 0400  12/27/15 1037  12/27/15 1526  12/28/15 0038  12/28/15 1252  12/28/15 2115 12/29/15 0306 12/29/15 1034 12/29/15 1645  HGB 10.7*  < > 11.6* 11.1*  < >  --   < >  --   < >  --   --   --   < > 10.2* 9.0* 8.0* 7.4*  HCT 31.6*  < > 34.3* 32.3*  < >  --   < >  --   < >  --   --   --   < > 30.3* 26.2* 23.4* 21.7*  PLT 87*  --  75*  --   < >  --   --   --   --   --   --   --   < > 72* 64* 55* PENDING  APTT 29  --   --   --   --   --   --   --   --   --   --   --   --   --   --   --   --   LABPROT 22.6*  --  16.8* 17.0*  --   --   --   --   --   --   --   --   --   --   --   --   --   INR 2.01*  --  1.35 1.37  --   --   --   --   --   --   --   --   --   --   --   --   --   HEPARINUNFRC  --   --   --   --   --   --   --   --   < >  --   < > 0.82*  --  <0.10*  --   --  0.19*  CREATININE  --   < > 0.75 0.78  --   --   --   --   --  0.84  --   --   --   --  0.88  --   --   TROPONINI 0.03  --  0.62* 1.20*  --  2.00*  --  2.06*  --   --   --   --   --   --   --   --   --   < > = values in this interval not displayed.  Estimated Creatinine Clearance: 60.7 mL/min (by C-G formula based on Cr of 0.88).  Assessment: 75 year old female presented with mechanical fall and femoral neck fracture who is s/p THA on 6/16/7. Found to have a large mobile mass in the right atrium and bilateral PE with RV strain. Patient received thrombolytic therapy via EKOS on 6/18. Patient became hemodynamically unstable and then suffered  PEA arrest on 6/18 PM with ROSC in 11min. Patient received 2 units PRBCs and 2 of Cryo - Heparin and  alteplase were held. No surgical exploration due to unknown source of CV collapse. CT scan showed no increase in pelvic hematoma and some additional hematoma in left thigh.   Re-consulted today to resume Heparin drip per pharmacy protocol for PE. No bolus due to concerns of prior bleed and low platelets. Patient was supra-therapeutic on 850 units/hr prior to these events.  Heparin restarted at 700 units/hr and level was low at 0.19 units/mL- lab was drawn about an hour early, but would still expect properly timed level to be low. Spoke with RN Alison Murray, she has not noticed worsening of hematomas, no other bleeding. Heparin is properly infusing. Hgb dropped to 7.4, plts stable at 60.  Goal of Therapy:  Heparin level 0.3-0.7 units/ml Monitor platelets by anticoagulation protocol: Yes   Plan:  -Increase heparin to 750 units/hr -No bolus, concerned to increase too quickly given events above -Check heparin level in 8 hours given patient is >13 years old -Daily heparin level and CBC.  -Monitor closely for signs and symptoms of bleeding.   Addaleigh Nicholls D. Annali Lybrand, PharmD, BCPS Clinical Pharmacist Pager: [phone] 12/29/2015 5:36 PM

## 2015-12-29 NOTE — Progress Notes (Signed)
Subjective:  Events of yesterday and evening since seen noted. Started on TPA by catheter.  Later had PEA and hypotension, Leg hematoma.  Currently intubated and sedated.  BP around 90.  Objective:  Vital Signs in the last 24 hours: BP 138/68 mmHg  Pulse 91  Temp(Src) 99 F (37.2 C) (Axillary)  Resp 26  Ht 5\' 7"  (1.702 m)  Wt 78.9 kg (173 lb 15.1 oz)  BMI 27.24 kg/m2  SpO2 100%  Physical Exam: White female intubated Lungs:  Clear  Cardiac:  Regular rhythm, normal S1 and S2, no S3, holosystolic 2/6 murmur of mitral regurgitation  Extremities:  1+ edema present  Intake/Output from previous day: 06/18 0701 - 06/19 0700 In: 5959.1 [I.V.:3891.1; Blood:888; NG/GT:40; IV Piggyback:1100] Out: 1190 [Urine:1190] Weight Filed Weights   12/26/15 2100 12/29/15 0530  Weight: 73.8 kg (162 lb 11.2 oz) 78.9 kg (173 lb 15.1 oz)    Lab Results: Basic Metabolic Panel:  Recent Labs  12/28/15 0038 12/29/15 0306  NA 140 143  K 3.8 3.3*  CL 110 116*  CO2 23 20*  GLUCOSE 133* 134*  BUN 15 20  CREATININE 0.84 0.88    CBC:  Recent Labs  12/28/15 2115 12/29/15 0306  WBC 13.7* 13.0*  HGB 10.2* 9.0*  HCT 30.3* 26.2*  MCV 88.9 86.2  PLT 72* 64*    Cardiac Panel (last 3 results)  Recent Labs  12/27/15 0400 12/27/15 1037 12/27/15 1526  TROPONINI 1.20* 2.00* 2.06*   Telemetry: Sinus rhythm  Assessment/Plan:  1.  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  Bilateral pulmonary emboli with some evidence of decompensation of her right ventricle  3.  Mitral valve prolapse with severe mitral regurgitation and likely unrelated 4.  Recent repair of a left femoral artery laceration 5. PEA arrest multifactorial  Recommendations:  Discussed with Dr. Unknown Jim.  Repeat ECHO this am to look at RA again. APpreciate help of all involved.  Husband updated. Kerry Hough  MD [facility] Cardiology  12/29/2015, 9:13 AM

## 2015-12-29 NOTE — Progress Notes (Signed)
[facility] ADULT ICU REPLACEMENT PROTOCOL FOR AM LAB REPLACEMENT ONLY  The patient does not apply for the [facility] Adult ICU Electrolyte Replacment Protocol based on the criteria listed below:     Is urine output >/= 0.5 ml/kg/hr for the last 6 hours? No. Patient's UOP is 0.3 ml/kg/hr   Abnormal electrolyte(s): K3.3   If a panic level lab has been reported, has the CCM MD in charge been notified? Yes.  .   Physician:  Governor Rooks MD  Vear Clock 12/29/2015 6:46 AM

## 2015-12-29 NOTE — Progress Notes (Signed)
PT Cancellation Note  Patient Details Name: Bonnie Salazar MRN: [id number] DOB: 1941-05-22   Cancelled Treatment:    Reason Eval/Treat Not Completed: Medical issues which prohibited therapy (per nurse, sign off and ask for reorder when appropriate. )Thanks.    Irwin Brakeman F 12/29/2015, 8:51 AM [facility] [phone] (pager)

## 2015-12-29 NOTE — Progress Notes (Signed)
Assessment/Plan: 3 Days Post-Op Procedure(s) (LRB): TOTAL HIP ARTHROPLASTY ANTERIOR APPROACH (Left)   Principal Problem:   Fracture of femoral neck, left (HCC) Active Problems:   Hip fracture (HCC)   Leukocytosis   Dehydration   Atrial thrombus (HCC)   Acute pulmonary embolism (HCC)   Acute respiratory failure (HCC)   Hemorrhagic shock   Cardiac arrest (HCC)   Left leg is stable w/ associated hematoma.  Thigh edemetous, but soft.  Cardiopulmonary Status is most critical at this time.  Catheter thrombolysis w/ tPA for right atrial thrombus was started yesterday and stopped w/ onset of new pain and required CPR/resuscitation. CT negative for new pelvic/abdominal hemorrhage.  Also does not show atrial mass.  Smaller PE burden of subsegmental lower lobes. Echo from A.M. 12/29/15 results pending.  Greatly appreciate consultants and their management.  Subjective: Agitated - better after pain medicines.  On Ventilator.  Objective: Vital signs in last 24 hours: Temp:  [97.2 F (36.2 C)-99 F (37.2 C)] 99 F (37.2 C) (06/19 0813) Pulse Rate:  [76-159] 91 (06/19 0800) Resp:  [0-35] 26 (06/19 0800) BP: (60-167)/(51-143) 138/68 mmHg (06/19 0800) SpO2:  [73 %-100 %] 100 % (06/19 0800) Arterial Line BP: (81-165)/(43-85) 87/43 mmHg (06/19 0800) FiO2 (%):  [50 %-100 %] 50 % (06/19 0400) Weight:  [78.9 kg (173 lb 15.1 oz)] 78.9 kg (173 lb 15.1 oz) (06/19 0530)  Intake/Output from previous day: 06/18 0701 - 06/19 0700 In: 5959.1 [I.V.:3891.1; Blood:888; NG/GT:40; IV Piggyback:1100] Out: 1190 [Urine:1190] Intake/Output this shift: Total I/O In: 170 [I.V.:170] Out: 45 [Urine:45]   Recent Labs  12/27/15 2145 12/28/15 1558 12/28/15 1825 12/28/15 2115 12/29/15 0306  HGB 10.2* 9.3* 8.3* 10.2* 9.0*    Recent Labs  12/28/15 2115 12/29/15 0306  WBC 13.7* 13.0*  RBC 3.41* 3.04*  HCT 30.3* 26.2*  PLT 72* 64*    Recent Labs  12/28/15 0038 12/29/15 0306  NA 140 143  K 3.8  3.3*  CL 110 116*  CO2 23 20*  BUN 15 20  CREATININE 0.84 0.88  GLUCOSE 133* 134*  CALCIUM 8.2* 7.6*   Physical Exam General: Agitated, on vent.  Improved after pain medicine. Musculoskeletal: Left leg / foot edematous and warm, + pulses.   Dressing: dry w/ scant drainage.    Bonnie Salazar 12/29/2015, 8:21 AM

## 2015-12-29 NOTE — Progress Notes (Signed)
ANTICOAGULATION CONSULT NOTE - Initial Consult  Pharmacy Consult for Heparin Indication: pulmonary embolus and and history of atrial fibrillation  No Known Allergies  Patient Measurements: Height: 5\' 7"  (170.2 cm) Weight: 173 lb 15.1 oz (78.9 kg) IBW/kg (Calculated) : 61.6 Heparin Dosing Weight: 77 kg  Vital Signs: Temp: 99 F (37.2 C) (06/19 0813) Temp Source: Axillary (06/19 0813) BP: 105/49 mmHg (06/19 0925) Pulse Rate: 99 (06/19 0925)  Labs:  Recent Labs  12/26/15 1740  12/27/15 12/27/15 0400  12/27/15 1037  12/27/15 1526  12/28/15 0038 12/28/15 0430 12/28/15 1252  12/28/15 1825 12/28/15 2115 12/29/15 0306  HGB 10.7*  < > 11.6* 11.1*  < >  --   < >  --   < >  --   --   --   < > 8.3* 10.2* 9.0*  HCT 31.6*  < > 34.3* 32.3*  < >  --   < >  --   < >  --   --   --   < > 24.8* 30.3* 26.2*  PLT 87*  --  75*  --   < >  --   --   --   --   --   --   --   < > 81* 72* 64*  APTT 29  --   --   --   --   --   --   --   --   --   --   --   --   --   --   --   LABPROT 22.6*  --  16.8* 17.0*  --   --   --   --   --   --   --   --   --   --   --   --   INR 2.01*  --  1.35 1.37  --   --   --   --   --   --   --   --   --   --   --   --   HEPARINUNFRC  --   --   --   --   --   --   --   --   < >  --  0.89* 0.82*  --   --  <0.10*  --   CREATININE  --   < > 0.75 0.78  --   --   --   --   --  0.84  --   --   --   --   --  0.88  TROPONINI 0.03  --  0.62* 1.20*  --  2.00*  --  2.06*  --   --   --   --   --   --   --   --   < > = values in this interval not displayed.  Estimated Creatinine Clearance: 60.7 mL/min (by C-G formula based on Cr of 0.88).   Medical History: Past Medical History  Diagnosis Date  . Medical history non-contributory   . Acute pulmonary embolism ([place]) 12/28/2015   Assessment: 75 year old female presented with mechanical fall and femoral neck fracture who is s/p THA on 6/16/7. Found to have a large mobile mass in the right atrium and bilateral PE with RV  strain. Patient received thrombolytic therapy via EKOS on 6/18. Patient became hemodynamically unstable and then suffered PEA arrest on 6/18 PM with ROSC in 70min. Patient received 2 units PRBCs and 2  of Cryo -lowest Hgb 8.2.  Heparin and alteplase were held. No surgical exploration due to unknown source of CV collapse. CT scan showed no increase in pelvic hematoma and some additional hematoma in left thigh.   Re-consulted today to resume Heparin drip per pharmacy protocol for PE. No bolus due to concerns of prior bleed and low platelets (trending down at 64). No overt bleeding noted. Patient was supra-therapeutic on 850 units/hr prior to these events.   Goal of Therapy:  Heparin level 0.3-0.7 units/ml Monitor platelets by anticoagulation protocol: Yes   Plan:  Restart at 700 units/hr - lower dose based on prior levels and event history listed above. No bolus as listed above.  Check heparin level in 6 hours.  Daily heparin level and CBC.  Monitor closely for signs and symptoms of bleeding.   Sloan Leiter, PharmD, BCPS Clinical Pharmacist [phone] 12/29/2015,10:16 AM

## 2015-12-29 NOTE — Progress Notes (Signed)
CSW consult acknowledged re: SNF Placement. Patient placed on ventilator after cardiac arrest on yesterday. CSW will continue to follow for disposition when Patient is medically stable and cleared for discharge.      Emiliano Dyer, LCSW [facility] ED/90M Clinical Social Worker [phone]

## 2015-12-29 NOTE — Progress Notes (Signed)
IR PA Olin Hauser said it was ok to d'c the right groin sheath. Called IR nurse (approx 1500) to have it pulled, but no answer. Will have to retry tomorrow

## 2015-12-29 NOTE — Progress Notes (Signed)
PULMONARY / CRITICAL CARE MEDICINE   Name: Bonnie Salazar MRN: [id number] DOB: July 20, 1940    ADMISSION DATE:  12/25/2015 CONSULTATION DATE:  12/26/15  REFERRING MD:  Trauma service  CHIEF COMPLAINT:  Hip fracture; need for mechanical ventilation post-operatively  HISTORY OF PRESENT ILLNESS:   Bonnie Salazar is a 75F who presented 12/25/15 after an fall at home with resultant left femoral neck fracture. She was taken to the OR for operative repair 6/16. The initial repair appeared uneventful and she was extubated in the PACU. Shortly thereafter, she complained of abdominal pain and was noted to have a tense abdomen. CT scan showed extravasation into the LLQ; she was taken to IR and underwent covered stent of the distal external iliac artery to the inguinal ligament. Vascular surgery was also consulted. No signs of distal ischemia on his exam. There is some concern for abdominal compartment syndrome, as well. MTP was initiated and she received 11u pRBC, 7FFP, 2 PLT pheresis, 2 cryoprecipitate.    SUBJECTIVE:  Sedated this AM after cardiac arrest yesterday.  VITAL SIGNS: BP 105/49 mmHg  Pulse 99  Temp(Src) 99 F (37.2 C) (Axillary)  Resp 26  Ht 5\' 7"  (1.702 m)  Wt 78.9 kg (173 lb 15.1 oz)  BMI 27.24 kg/m2  SpO2 100%  HEMODYNAMICS: CVP:  [5 mmHg-12 mmHg] 8 mmHg  VENTILATOR SETTINGS: Vent Mode:  [-] PRVC FiO2 (%):  [40 %-100 %] 40 % Set Rate:  [26 bmp] 26 bmp Vt Set:  [500 mL] 500 mL PEEP:  [10 cmH20] 10 cmH20 Plateau Pressure:  [23 cmH20-24 cmH20] 23 cmH20  INTAKE / OUTPUT:  Intake/Output Summary (Last 24 hours) at 12/29/15 1005 Last data filed at 12/29/15 0800  Gross per 24 hour  Intake 5813.57 ml  Output   1120 ml  Net 4693.57 ml   PHYSICAL EXAMINATION:  General Acutely ill appearing female, intubated and ventilated.   HEENT [place]/AT, PERRLA, EOM-I and MMM.  Pulmonary ETT in place, coarse BS diffusely.  Cardiovascular Intermittent & irregular  rapid rate; II/VI  diastolic heart murmur.  Distal pulses palpable.  Abdomen Distended, hypoactive bowel sounds.  Musculoskeletal L hip post-surgical changes / dressing in place  Lymphatics No cervical, supraclavicular or axillary adenopathy.   Neurologic Grossly intact. No focal deficits.   Skin/Integuement No rash, no cyanosis, no clubbing. Pale. L [place] CVC in place. LLE edematous   LABS:  BMET  Recent Labs Lab 12/27/15 0400 12/28/15 0038 12/29/15 0306  NA 140 140 143  K 3.6 3.8 3.3*  CL 110 110 116*  CO2 22 23 20*  BUN 14 15 20   CREATININE 0.78 0.84 0.88  GLUCOSE 145* 133* 134*   Electrolytes  Recent Labs Lab 12/27/15 0400 12/28/15 0038 12/29/15 0306  CALCIUM 8.4* 8.2* 7.6*   CBC  Recent Labs Lab 12/28/15 1825 12/28/15 2115 12/29/15 0306  WBC 11.1* 13.7* 13.0*  HGB 8.3* 10.2* 9.0*  HCT 24.8* 30.3* 26.2*  PLT 81* 72* 64*   Coag's  Recent Labs Lab 12/26/15 1740 12/27/15 12/27/15 0400  APTT 29  --   --   INR 2.01* 1.35 1.37   Sepsis Markers  Recent Labs Lab 12/27/15 0040  LATICACIDVEN 2.4*   ABG  Recent Labs Lab 12/26/15 1855 12/28/15 1918 12/28/15 2127  PHART 7.268* 7.168* 7.371  PCO2ART 38.5 46.7* 32.9*  PO2ART 465.0* 281.0* 73.0*   Liver Enzymes  Recent Labs Lab 12/27/15 12/27/15 0400 12/29/15 0306  AST 51* 45* 52*  ALT 28 25 21   ALKPHOS  66 59 48  BILITOT 1.2 1.1 0.6  ALBUMIN 3.5 3.1* 2.5*   Cardiac Enzymes  Recent Labs Lab 12/27/15 0400 12/27/15 1037 12/27/15 1526  TROPONINI 1.20* 2.00* 2.06*   Glucose  Recent Labs Lab 12/26/15 2036 12/26/15 2358 12/28/15 2202 12/28/15 2332 12/29/15 0318  GLUCAP 209* 212* 279* 236* 138*   Imaging Ct Head Wo Contrast  12/28/2015  CLINICAL DATA:  75 year old female with acute respiratory distress during venous thrombolysis. EXAM: CT HEAD WITHOUT CONTRAST TECHNIQUE: Contiguous axial images were obtained from the base of the skull through the vertex without intravenous contrast. COMPARISON:  CT  08/20/2002 FINDINGS: Note that there is venous contamination given the recent abdominal CT. No large acute intracranial hemorrhage. No midline shift or mass effect. Ventricular system unremarkable. Gray-white differentiation maintained. No significant white matter disease. Hyperdense mass along the inner table of the left frontal bone measures 5 mm x 10 mm, compatible with a small meningioma without local mass effect. This was identified (by report) on the CT of 08/20/2002. Unremarkable appearance of the calvarium without acute fracture or aggressive lesion. Unremarkable appearance of the scalp soft tissues. Unremarkable appearance of the bilateral orbits. Mastoid air cells are clear. No significant paranasal sinus disease IMPRESSION: No CT evidence of acute intracranial abnormality. 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 Signed, Dulcy Fanny. Earleen Newport, DO [facility] Radiology Electronically Signed   By: Corrie Mckusick D.O.   On: 12/28/2015 21:28   Ir Mariana Arn Ivc  12/28/2015  INDICATION: 75 year old female, status post postoperative hemorrhage left external iliac artery branch vessel, with embolization for hemostasis. Subsequent development of respiratory decompensation with cardiac echo demonstrating giant atrial thrombus. Surgical embolectomy carries a high morbidity/ mortality given the patient's current ICU status. Alternative treatment options include catheter embolectomy/thrombectomy, as well as catheter directed thrombolysis. Family has elected to proceed with least invasive therapy. EXAM: IR INFUSION THROMBOL ARTERIAL INITIAL (MS); IR ULTRASOUND GUIDANCE VASC ACCESS RIGHT; INFERIOR VENA CAVOGRAM COMPARISON:  CT pelvis 12/26/2015 CT chest 12/27/2015 Cardiac ECHO 12/27/2015 and 12/28/2015 MEDICATIONS: None. ANESTHESIA/SEDATION: Versed 0.5 mg IV; Fentanyl 0 mcg IV No moderate sedation The patient was continuously  monitored during the procedure by the interventional radiology nurse under my direct supervision. FLUOROSCOPY TIME:  Fluoroscopy Time: 0 minutes 36 seconds (58 mGy). COMPLICATIONS: None TECHNIQUE: Informed written consent was obtained from the patient and the patient's husband after a thorough discussion of the procedural risks, benefits and alternatives. All questions were addressed. Maximal Sterile Barrier Technique was utilized including caps, mask, sterile gowns, sterile gloves, sterile drape, hand hygiene and skin antiseptic. A timeout was performed prior to the initiation of the procedure. Patient positioned supine position on the fluoroscopy table. The right inguinal region was prepped and draped in the usual sterile fashion. Ultrasound survey of the right inguinal region was performed with images stored and sent to PACs. A single wall 19 gauge needle was used access the right common femoral vein under ultrasound. With excellent venous blood flow returned, an 035 Bentson wire was passed through the needle, observed enter the abdominal IVC under fluoroscopy. The needle was removed, and a standard 5 [place] vascular sheath was placed. The dilator was removed and the sheath was flushed. Limited venogram was then performed of the right iliac vein and the IVC. A stiff Glidewire was then advanced to the inferior cavoatrial junction. A 20 cm infusion length Uni fuse catheter was then placed at the suprarenal IVC, with the  tip terminating in the inferior right atrium. Final image was stored. Patient tolerated the procedure well and remained hemodynamically unchanged. No blood loss. FINDINGS: Ultrasound survey demonstrates patent right common femoral vein. Limited venogram of the right iliac vein and IVC demonstrates no thrombus of the visualized right iliac system. Venous reflux into the left iliac system with slow flow through the IVC. No occlusive thrombus of the infrarenal or suprarenal IVC. There is poor  definition of the known right atrial thrombus given the low volume injection. IMPRESSION: Status post right common femoral vein access for placement of thrombolysis catheter and initiation of pharmacologic thrombolysis to treat known right atrial thrombus. Signed, Dulcy Fanny. Earleen Newport, DO [facility] Special[facility] Radiology Electronically Signed   By: Corrie Mckusick D.O.   On: 12/28/2015 16:56   Ir US Guide Vasc Access Right  12/28/2015  INDICATION: 75 year old female, status post postoperative hemorrhage left external iliac artery branch vessel, with embolization for hemostasis. Subsequent development of respiratory decompensation with cardiac echo demonstrating giant atrial thrombus. Surgical embolectomy carries a high morbidity/ mortality given the patient's current ICU status. Alternative treatment options include catheter embolectomy/thrombectomy, as well as catheter directed thrombolysis. Family has elected to proceed with least invasive therapy. EXAM: IR INFUSION THROMBOL ARTERIAL INITIAL (MS); IR ULTRASOUND GUIDANCE VASC ACCESS RIGHT; INFERIOR VENA CAVOGRAM COMPARISON:  CT pelvis 12/26/2015 CT chest 12/27/2015 Cardiac ECHO 12/27/2015 and 12/28/2015 MEDICATIONS: None. ANESTHESIA/SEDATION: Versed 0.5 mg IV; Fentanyl 0 mcg IV No moderate sedation The patient was continuously monitored during the procedure by the interventional radiology nurse under my direct supervision. FLUOROSCOPY TIME:  Fluoroscopy Time: 0 minutes 36 seconds (58 mGy). COMPLICATIONS: None TECHNIQUE: Informed written consent was obtained from the patient and the patient's husband after a thorough discussion of the procedural risks, benefits and alternatives. All questions were addressed. Maximal Sterile Barrier Technique was utilized including caps, mask, sterile gowns, sterile gloves, sterile drape, hand hygiene and skin antiseptic. A timeout was performed prior to the initiation of the procedure. Patient  positioned supine position on the fluoroscopy table. The right inguinal region was prepped and draped in the usual sterile fashion. Ultrasound survey of the right inguinal region was performed with images stored and sent to PACs. A single wall 19 gauge needle was used access the right common femoral vein under ultrasound. With excellent venous blood flow returned, an 035 Bentson wire was passed through the needle, observed enter the abdominal IVC under fluoroscopy. The needle was removed, and a standard 5 [place] vascular sheath was placed. The dilator was removed and the sheath was flushed. Limited venogram was then performed of the right iliac vein and the IVC. A stiff Glidewire was then advanced to the inferior cavoatrial junction. A 20 cm infusion length Uni fuse catheter was then placed at the suprarenal IVC, with the tip terminating in the inferior right atrium. Final image was stored. Patient tolerated the procedure well and remained hemodynamically unchanged. No blood loss. FINDINGS: Ultrasound survey demonstrates patent right common femoral vein. Limited venogram of the right iliac vein and IVC demonstrates no thrombus of the visualized right iliac system. Venous reflux into the left iliac system with slow flow through the IVC. No occlusive thrombus of the infrarenal or suprarenal IVC. There is poor definition of the known right atrial thrombus given the low volume injection. IMPRESSION: Status post right common femoral vein access for placement of thrombolysis catheter and initiation of pharmacologic thrombolysis to treat known right atrial thrombus. Signed, Dulcy Fanny. Earleen Newport, DO Vascular  and Interventional Radiology Specialists [facility] Radiology Electronically Signed   By: Corrie Mckusick D.O.   On: 12/28/2015 16:56   Dg Chest Port 1 View  12/29/2015  CLINICAL DATA:  75 year old female with a history of left hip replacement, postoperative hemorrhage and subsequent embolization, and development of  pulmonary emboli. 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 EXAM: PORTABLE CHEST 1 VIEW COMPARISON:  Multiple prior FINDINGS: Cardiomediastinal silhouette unchanged in size and contour. Similar position of defibrillator pads on the chest wall. Unchanged position of the endotracheal tube, which terminates suitably above the carina approximately 3.4 cm. Gastric tube unchanged projecting over the mediastinum and terminating and out of the field of view. Unchanged position of left subclavian central catheter which terminates in the superior vena cava. Opacities at the bilateral lung bases partially obscuring the hemidiaphragms and the heart borders. Veiled opacity in the lower lungs. Mixed interstitial and airspace opacities in the mid and lower lobes. No pneumothorax. No displaced rib fracture. IMPRESSION: Opacities the bilateral lung bases, likely a combination of pleural effusion and atelectasis/ consolidation. Unchanged position of support apparatus, including defibrillator pads, gastric tube, left subclavian central venous catheter, and endotracheal tube which terminates suitably above the carina. Signed, Dulcy Fanny. Earleen Newport, DO [facility] Inter[facility] Radiology Special[facility] Radiology Electronically Signed   By: Corrie Mckusick D.O.   On: 12/29/2015 07:05   Dg Chest Port 1 View  12/28/2015  CLINICAL DATA:  Evaluate endotracheal and orogastric tube placement. Acute respiratory failure. EXAM: PORTABLE CHEST 1 VIEW COMPARISON:  Chest radiograph December 28, 2015 at 0916 hours FINDINGS: Endotracheal tube tip projects 4.8 cm above the carina. Nasogastric tube past proximal stomach, not imaged. Nasogastric tube side port projects in proximal stomach. Stable appearance of LEFT subclavian central venous catheter with distal tip projecting in mid superior vena cava. Similar moderate RIGHT pleural effusion. Bibasilar strandy densities. Biapical pleural capping. No  pneumothorax. Soft tissue planes and included osseous structures are nonsuspicious. IMPRESSION: Endotracheal tube tip projects 4.8 cm above the carina. Nasogastric tube past proximal stomach. No change in LEFT subclavian central venous catheter. Moderate RIGHT pleural effusion with bibasilar atelectasis. Electronically Signed   By: Elon Alas M.D.   On: 12/28/2015 19:38   Ct Angio Abd/pel W/ And/or W/o  12/28/2015  CLINICAL DATA:  75 year old female with left hip arthroplasty and postoperative hemorrhage. Discovery of rate atrial thrombus, with respiratory compromise. Status post initiation of catheter directed thrombolysis of atrial thrombus via right common femoral vein approach. EXAM: CT ANGIOGRAPHY ABDOMEN AND PELVIS TECHNIQUE: Multidetector CT imaging through the abdomen and pelvis was performed using the standard protocol during bolus administration of intravenous contrast. Multiplanar reconstructed images including MIPs were obtained and reviewed to evaluate the vascular anatomy. CONTRAST:  100 cc Isovue 370 COMPARISON:  CT chest 12/27/2015, CT pelvis 12/26/2015 FINDINGS: LOWER CHEST: Nonvascular: Unremarkable appearance of the superficial soft tissues the chest. Heart size within normal limits. Tip of left subclavian central venous catheter partially visualized terminating in the superior vena cava. Tip of the lower extremity lytic catheter terminates in the right atrium. Questionable hypoperfusion of the myocardium. Small bilateral pleural effusions with associated atelectasis. Gastric tube continues through the esophagus terminating in the stomach. Aeration of the bilateral lower lobes improved compared to the prior CT, status post intubation with positive pressure ventilation. Endotracheal tube terminates above the carina. Vascular: Normal course caliber and contour of the thoracic aorta. Minimal atherosclerotic changes. No dissection or aneurysm. No ulceration. Timing is not  optimized for the  evaluation of the pulmonary arteries, however, there is no central, lobar filling defects. There are a few visualized segmental/subsegmental filling defects in the distribution of the comparison CT, with improved thrombus burden of the bilateral lower lobes. There is no large filling defect identified within the right atrium on the CT. ABDOMEN PELVIS: Nonvascular: Intermediate density fluid layered in the bilateral subdiaphragmatic space tracking along the bilateral pericolic gutter. Mixed density fluid in the pelvis, predominantly extraperitoneal (within the space of Retzius. Overall volume appears decreased from the comparison CT of the pelvis performed 12/26/2015 largest component on today's study measures approximately 16.3 cm by 11 cm, compared to 15.5 cm x 15 cm. Unremarkable appearance of liver, spleen. Unremarkable appearance of the pancreas. Unremarkable appearance of the bilateral adrenal glands. No evidence of hydronephrosis. Unremarkable course of the bilateral ureters, which are partially opacified with contrast. High density material within the gallbladder, compatible with vicarious excretion of contrast. Urinary bladder decompressed with catheter in position. Vascular: Normal course caliber and contour of the abdominal aorta. Scattered calcifications present. No aneurysm or dissection flap. No periaortic fluid. Celiac artery and superior mesenteric artery are patent. Bilateral renal arteries patent with no significant stenosis. Inferior mesenteric artery patent. Right lower extremity: Normal course caliber and contour of the right external iliac artery without aneurysm or dissection flap. No active extravasation or evidence of arterial injury. Right hypogastric arteries patent including anterior and posterior branches. Proximal right femoral arteries patent. Left lower extremity: Normal course caliber and contour of the left common iliac artery. No aneurysm or dissection. No occlusion. Hypogastric  arteries patent including anterior posterior branches. No evidence of active extravasation or contrast pooling involving the hypogastric territory. External iliac artery is patent. Vascular stent in place at the site of previously identified injury. Stent terminates at the level of the inguinal ligament. Proximal left femoral arteries unremarkable with no active extravasation. There is enlarging hematoma of the proximal left thigh compared to the prior pelvis CT. Hematoma measures approximately 10.5 cm in greatest diameter, increased from 9.3 cm on the prior. Hematoma centered within the anterior and lateral rectus musculature. There is a tiny linear hyperdensity centered within the hematoma on the lateral aspect, potentially site of hemorrhage. No significant pooling on the post contrast images. Unremarkable appearance of the venous system. Venous catheter present within the right femoral system, traversing the IVC. The left external iliac and common iliac vein are compressed by a pelvic hematoma. Musculoskeletal: Surgical changes of left hip arthroplasty. Mild degenerative changes of the spine. No displaced fracture is identified. Review of the MIP images confirms the above findings. IMPRESSION: No evidence of new intraperitoneal or pelvic hemorrhage to account for new back pain. Re- demonstration of extraperitoneal and intraperitoneal hemorrhage, with decreasing size of the largest component in the space of Retzius and within the extraperitoneal pelvis. There is increasing hematoma in the left proximal thigh, with dense focus centered within the musculature which may indicate venous bleeding or small arterial bleeding. This detail was discussed with the managing physicians at the time of the CT scan. Patent left external iliac stent, status post embolization. Questionable hypodensity of the left ventricle myocardium, potentially indicating hypoperfusion. Compared to the prior CT of the chest, there is decreasing  appreciable volume of pulmonary embolus in the bilateral lower lobes. No large right atrial filling defect identified on the delayed images. Small bilateral pleural effusions with associated atelectasis. Signed, Dulcy Fanny. Earleen Newport, DO [facility] Radiology Special[facility] Radiology Electronically Signed  By: Corrie Mckusick D.O.   On: 12/28/2015 21:10   Ir Infusion Thrombol Venous Initial (ms)  12/28/2015  INDICATION: 75 year old female, status post postoperative hemorrhage left external iliac artery branch vessel, with embolization for hemostasis. Subsequent development of respiratory decompensation with cardiac echo demonstrating giant atrial thrombus. Surgical embolectomy carries a high morbidity/ mortality given the patient's current ICU status. Alternative treatment options include catheter embolectomy/thrombectomy, as well as catheter directed thrombolysis. Family has elected to proceed with least invasive therapy. EXAM: IR INFUSION THROMBOL ARTERIAL INITIAL (MS); IR ULTRASOUND GUIDANCE VASC ACCESS RIGHT; INFERIOR VENA CAVOGRAM COMPARISON:  CT pelvis 12/26/2015 CT chest 12/27/2015 Cardiac ECHO 12/27/2015 and 12/28/2015 MEDICATIONS: None. ANESTHESIA/SEDATION: Versed 0.5 mg IV; Fentanyl 0 mcg IV No moderate sedation The patient was continuously monitored during the procedure by the interventional radiology nurse under my direct supervision. FLUOROSCOPY TIME:  Fluoroscopy Time: 0 minutes 36 seconds (58 mGy). COMPLICATIONS: None TECHNIQUE: Informed written consent was obtained from the patient and the patient's husband after a thorough discussion of the procedural risks, benefits and alternatives. All questions were addressed. Maximal Sterile Barrier Technique was utilized including caps, mask, sterile gowns, sterile gloves, sterile drape, hand hygiene and skin antiseptic. A timeout was performed prior to the initiation of the procedure. Patient positioned supine position on the fluoroscopy  table. The right inguinal region was prepped and draped in the usual sterile fashion. Ultrasound survey of the right inguinal region was performed with images stored and sent to PACs. A single wall 19 gauge needle was used access the right common femoral vein under ultrasound. With excellent venous blood flow returned, an 035 Bentson wire was passed through the needle, observed enter the abdominal IVC under fluoroscopy. The needle was removed, and a standard 5 [place] vascular sheath was placed. The dilator was removed and the sheath was flushed. Limited venogram was then performed of the right iliac vein and the IVC. A stiff Glidewire was then advanced to the inferior cavoatrial junction. A 20 cm infusion length Uni fuse catheter was then placed at the suprarenal IVC, with the tip terminating in the inferior right atrium. Final image was stored. Patient tolerated the procedure well and remained hemodynamically unchanged. No blood loss. FINDINGS: Ultrasound survey demonstrates patent right common femoral vein. Limited venogram of the right iliac vein and IVC demonstrates no thrombus of the visualized right iliac system. Venous reflux into the left iliac system with slow flow through the IVC. No occlusive thrombus of the infrarenal or suprarenal IVC. There is poor definition of the known right atrial thrombus given the low volume injection. IMPRESSION: Status post right common femoral vein access for placement of thrombolysis catheter and initiation of pharmacologic thrombolysis to treat known right atrial thrombus. Signed, Dulcy Fanny. Earleen Newport, DO [facility] Radiology Special[facility] Radiology Electronically Signed   By: Corrie Mckusick D.O.   On: 12/28/2015 16:56  PCXR: bilateral pulmonary edema ETT good position. NGT was in mid esophagus.   STUDIES:  ECHO 6/17: - Left ventricle: The cavity size was normal. Systolic function was normal. The estimated ejection fraction was in the range of 50% to  55%. 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 - Left atrium: The atrium was mildly dilated.- Right ventricle: The cavity size was mildly dilated. Wall  thickness was normal.- Right atrium: There is a large mobile mass in the right atrium  that prolapses toward the mitral valve. CT chest 6/17: Bilateral  pulmonary emboli, nearly occlusive in the left lower lobe segmental branches, involving all of the segmental pulmonary arteries. Evidence of right heart strain with right/left heart ratio of over 1. Heterogeneous area of hypoattenuation within the right atrium may represent right atrial clot versus mixing of non-opacified blood from the inferior vena cava. Bilateral pleural effusions and atelectatic changes of bilateral lung bases. LE [place] 6/17: negative for DVT ECHO 6/18>>>  CULTURES: None  ANTIBIOTICS: Perioperative Ancef  SIGNIFICANT EVENTS: See HPI 6/17 extubated, AF w/ CE elevation so ECHO checked-->round RA thrombus. Started on heparin. Cards, CVTS and IR involved in care. Later that evening increased resp distress. FIO2 req increased. CT chest w/ bilateral PE 6/18 remaining hypoxic. Hgb stable, repeat ECHO obtained and consideration for EKOS.   LINES/TUBES: Foley 6/16  PIV ETT 6/16-->6/17>>>6/18>>> L [place] DLC 6/16>>>  DISCUSSION: Ms. 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 Events since yesterday--> increased CEs, AF, ECHO w/ RA thrombus, later c/b PE, and hypoxic resp failure. She was placed on heparin and multiple specialties have been involved. As of this am her RV fxn is worse and there is still large clot in the RA. Awaiting call back from IR re: possible catheter lysis and in-put of IR and  CVTS. May end up just going w/ IV systemic heparin only.   ASSESSMENT / PLAN:  PULMONARY A: Acute Hypoxic Respiratory Failure  Mild Pulmonary edema  Acute bilateral PE-->RV strain has increased but not sure that this is resp for her resp failure   P:   Full vent support today. Titrate O2 for sats >94%. Cont heparin. Begin PS trials today and SBT in AM.  CARDIOVASCULAR A:  Troponin elevation -->likely demand ischemia  Heart murmur Intermittent AF Severe MR RA clott;  Now w/ acute PE (likely migrated) -->appreciate cards, thoracic and interventional assist.  Repeat echo without a clot. P:  Neo at 50. Follow CVP. Resume heparin.  RENAL A:   No acute issues P:   BMET in AM. Replace electrolytes as indicated.  GASTROINTESTINAL A:   Concern for abdominal compartment syndrome--> no evidence of active bleeding  P:   Restart TF. Famotidine for ppx  HEMATOLOGIC A:   Arterial hemorrhage s/p stenting and requiring massive transfusion propofol RA thrombus-->likely propagated from right pelvic vein compression from hematoma-->migrated and now with BILATERAL PE P:  Trend CBC Transfuse as needed Restart Heparin GTT. Repeat right sided ECHO no clot seen on 6/19.  INFECTIOUS A:   No acute issues - leukocytosis likely stress response S/p periop Ancef P:   Monitor  ENDOCRINE A:   No acute issues  P:   Trend glucose on chemistries   NEUROLOGIC A:   Anxiety and post-op pain  P:   RASS goal: -1 Fent drip PRN versed  FAMILY  - Updates: Family updated bedside.  The patient is critically ill with multiple organ systems failure and requires high complexity decision making for assessment and support, frequent evaluation and titration of therapies, application of advanced monitoring technologies and extensive interpretation of multiple databases.   Critical Care Time devoted to patient care services described in this note is 100 Minutes. This time reflects time of  care of this signee Dr [name]. This critical care time does not reflect procedure time, or teaching time or supervisory time of PA/NP/Med student/Med Resident etc  but could involve care discussion time.  Rush Farmer, M.D. [facility] Pulmonary/Critical Care Medicine. Pager: [phone]. After hours pager: [phone].

## 2015-12-29 NOTE — Care Management Important Message (Signed)
Important Message  Patient Details  Name: Bonnie Salazar MRN: [id number] Date of Birth: July 07, 1941   Medicare Important Message Given:  Yes    Nathen May 12/29/2015, 10:59 AM

## 2015-12-29 NOTE — Progress Notes (Signed)
Echocardiogram 2D Echocardiogram limited has been performed.  Tresa Res 12/29/2015, 9:53 AM

## 2015-12-29 NOTE — Anesthesia Postprocedure Evaluation (Signed)
Anesthesia Post Note  Patient: Bonnie Salazar  Procedure(s) Performed: Procedure(s) (LRB): TOTAL HIP ARTHROPLASTY ANTERIOR APPROACH (Left)  Anesthesia Post Evaluation  Last Vitals:  Filed Vitals:   12/29/15 0800 12/29/15 0813  BP: 138/68   Pulse: 91   Temp:  37.2 C  Resp: 26     Last Pain:  Filed Vitals:   12/29/15 0816  PainSc: Asleep                 Leonia Reeves

## 2015-12-29 NOTE — Progress Notes (Signed)
Nutrition Follow-up  DOCUMENTATION CODES:   Not applicable  INTERVENTION:    Initiate TF via OG tube with Vital AF 1.2 at goal rate of 55 ml/h (1320 ml per day) to provide 1584 kcals, 99 gm protein, 1071 ml free water daily.  NUTRITION DIAGNOSIS:   Inadequate oral intake related to inability to eat as evidenced by NPO status.  Ongoing  GOAL:   Patient will meet greater than or equal to 90% of their needs  Unmet  MONITOR:   Supplement acceptance, Weight trends, Labs, I & O's, Diet advancement  ASSESSMENT:   75 y.o. female who complains of Left hip pain s/p fall at home. No LOC. She tripped on a drawer. Pain was rated as a 10/10 at the time of injury. No CP, SOB. XRays show left femoral neck fracture.  Discussed patient during ICU rounds and with RN today. Plans to start TF today. RD to order. Patient is currently intubated on ventilator support. MV: 13 L/min Temp (24hrs), Avg:98.2 F (36.8 C), Min:97.2 F (36.2 C), Max:99.1 F (37.3 C)  Propofol: none MAP 71   Diet Order:   NPO  Skin:  Reviewed, no issues  Last BM:  6/14  Height:   Ht Readings from Last 1 Encounters:  12/27/15 5\' 7"  (1.702 m)    Weight:   Wt Readings from Last 1 Encounters:  12/29/15 173 lb 15.1 oz (78.9 kg)  12/26/15 162 lb 11.2 oz (73.8 kg)  Ideal Body Weight:  61.36 kg  BMI:  Body mass index is 27.24 kg/(m^2).  Estimated Nutritional Needs:   Kcal:  [id number]  Protein:  95-110 gm  Fluid:  1.6-1.8 L  EDUCATION NEEDS:   No education needs identified at this time  Molli Barrows, [place], [place], [place] Pager [phone] After Hours Pager [phone]

## 2015-12-29 NOTE — Progress Notes (Signed)
Patient ID: Bonnie Salazar, female   DOB: 1941/02/07, 75 y.o.   MRN: [id number] Sedated on vent. Arouses to stimulus. Abdomen soft and distended No change in left lateral thigh swelling. Feet cool but biphasic posterior tibial signals bilaterally Continue support. Nothing to have from vascular surgical standpoint

## 2015-12-30 ENCOUNTER — Inpatient Hospital Stay (HOSPITAL_COMMUNITY): Payer: Medicare HMO

## 2015-12-30 LAB — POCT I-STAT 3, ART BLOOD GAS (G3+)
ACID-BASE DEFICIT: 5 mmol/L — AB (ref 0.0–2.0)
BICARBONATE: 20.9 meq/L (ref 20.0–24.0)
O2 Saturation: 100 %
PCO2 ART: 43.5 mmHg (ref 35.0–45.0)
PH ART: 7.292 — AB (ref 7.350–7.450)
PO2 ART: 212 mmHg — AB (ref 80.0–100.0)
Patient temperature: 99.6
TCO2: 22 mmol/L (ref 0–100)

## 2015-12-30 LAB — TYPE AND SCREEN
ABO/RH(D): B POS
Antibody Screen: NEGATIVE
UNIT DIVISION: 0
UNIT DIVISION: 0
UNIT DIVISION: 0
UNIT DIVISION: 0
UNIT DIVISION: 0
UNIT DIVISION: 0
UNIT DIVISION: 0
UNIT DIVISION: 0
UNIT DIVISION: 0
Unit division: 0
Unit division: 0
Unit division: 0
Unit division: 0
Unit division: 0
Unit division: 0
Unit division: 0
Unit division: 0
Unit division: 0
Unit division: 0
Unit division: 0
Unit division: 0
Unit division: 0
Unit division: 0
Unit division: 0

## 2015-12-30 LAB — HEPARIN LEVEL (UNFRACTIONATED)
HEPARIN UNFRACTIONATED: 0.24 [IU]/mL — AB (ref 0.30–0.70)
HEPARIN UNFRACTIONATED: 0.37 [IU]/mL (ref 0.30–0.70)
Heparin Unfractionated: 0.29 IU/mL — ABNORMAL LOW (ref 0.30–0.70)

## 2015-12-30 LAB — URINALYSIS, ROUTINE W REFLEX MICROSCOPIC
BILIRUBIN URINE: NEGATIVE
Glucose, UA: NEGATIVE mg/dL
KETONES UR: NEGATIVE mg/dL
Leukocytes, UA: NEGATIVE
NITRITE: NEGATIVE
PH: 6 (ref 5.0–8.0)
Protein, ur: 30 mg/dL — AB
Specific Gravity, Urine: 1.021 (ref 1.005–1.030)

## 2015-12-30 LAB — GLUCOSE, CAPILLARY
GLUCOSE-CAPILLARY: 157 mg/dL — AB (ref 65–99)
GLUCOSE-CAPILLARY: 157 mg/dL — AB (ref 65–99)
Glucose-Capillary: 100 mg/dL — ABNORMAL HIGH (ref 65–99)
Glucose-Capillary: 129 mg/dL — ABNORMAL HIGH (ref 65–99)
Glucose-Capillary: 133 mg/dL — ABNORMAL HIGH (ref 65–99)
Glucose-Capillary: 143 mg/dL — ABNORMAL HIGH (ref 65–99)
Glucose-Capillary: 153 mg/dL — ABNORMAL HIGH (ref 65–99)

## 2015-12-30 LAB — MAGNESIUM: Magnesium: 1.8 mg/dL (ref 1.7–2.4)

## 2015-12-30 LAB — CBC
HEMATOCRIT: 20.6 % — AB (ref 36.0–46.0)
Hemoglobin: 7 g/dL — ABNORMAL LOW (ref 12.0–15.0)
MCH: 30.1 pg (ref 26.0–34.0)
MCHC: 33.5 g/dL (ref 30.0–36.0)
MCV: 90 fL (ref 78.0–100.0)
PLATELETS: 76 10*3/uL — AB (ref 150–400)
RBC: 2.29 MIL/uL — ABNORMAL LOW (ref 3.87–5.11)
RDW: 16.2 % — AB (ref 11.5–15.5)
WBC: 15.6 10*3/uL — AB (ref 4.0–10.5)

## 2015-12-30 LAB — ECHOCARDIOGRAM LIMITED
Height: 67 in
Reg peak vel: 241 cm/s
TR max vel: 241 cm/s
Weight: 2783.09 oz

## 2015-12-30 LAB — URINE MICROSCOPIC-ADD ON

## 2015-12-30 LAB — CORTISOL: Cortisol, Plasma: 18.3 ug/dL

## 2015-12-30 LAB — PHOSPHORUS
PHOSPHORUS: 1.7 mg/dL — AB (ref 2.5–4.6)
Phosphorus: <1 mg/dL — CL (ref 2.5–4.6)

## 2015-12-30 MED ORDER — ACETAMINOPHEN 160 MG/5ML PO SOLN
650.0000 mg | Freq: Four times a day (QID) | ORAL | Status: DC | PRN
  Administered 2016-01-02 – 2016-01-07 (×2): 650 mg
  Filled 2015-12-30 (×3): qty 20.3

## 2015-12-30 MED ORDER — SODIUM CHLORIDE 0.9 % IV SOLN
1500.0000 mg | Freq: Once | INTRAVENOUS | Status: AC
  Administered 2015-12-30: 1500 mg via INTRAVENOUS
  Filled 2015-12-30: qty 1500

## 2015-12-30 MED ORDER — SODIUM PHOSPHATES 45 MMOLE/15ML IV SOLN
20.0000 mmol | Freq: Once | INTRAVENOUS | Status: AC
  Administered 2015-12-30: 20 mmol via INTRAVENOUS
  Filled 2015-12-30: qty 6.67

## 2015-12-30 MED ORDER — POTASSIUM CHLORIDE 20 MEQ/15ML (10%) PO SOLN
ORAL | Status: AC
  Filled 2015-12-30: qty 30

## 2015-12-30 MED ORDER — PIPERACILLIN-TAZOBACTAM 3.375 G IVPB
3.3750 g | Freq: Three times a day (TID) | INTRAVENOUS | Status: DC
  Administered 2015-12-30 – 2016-01-05 (×17): 3.375 g via INTRAVENOUS
  Filled 2015-12-30 (×18): qty 50

## 2015-12-30 MED ORDER — VANCOMYCIN HCL IN DEXTROSE 1-5 GM/200ML-% IV SOLN
1000.0000 mg | Freq: Two times a day (BID) | INTRAVENOUS | Status: DC
  Administered 2015-12-31 – 2016-01-02 (×5): 1000 mg via INTRAVENOUS
  Filled 2015-12-30 (×6): qty 200

## 2015-12-30 MED ORDER — FUROSEMIDE 10 MG/ML IJ SOLN
INTRAMUSCULAR | Status: AC
Start: 2015-12-30 — End: 2015-12-30
  Filled 2015-12-30: qty 4

## 2015-12-30 MED ORDER — FUROSEMIDE 10 MG/ML IJ SOLN
20.0000 mg | Freq: Once | INTRAMUSCULAR | Status: AC
  Administered 2015-12-30: 20 mg via INTRAVENOUS
  Filled 2015-12-30: qty 2

## 2015-12-30 NOTE — Progress Notes (Signed)
Sat monitor probe very positional

## 2015-12-30 NOTE — Progress Notes (Signed)
Wakeup assessment defered this AM until physician can evaluate. Patient requires more sedation this AM and is very agitated. Rass +2

## 2015-12-30 NOTE — Progress Notes (Signed)
Subjective: Interval History: none.. Agitated on vent.  Objective: Vital signs in last 24 hours: Temp:  [97.7 F (36.5 C)-99.1 F (37.3 C)] 98.4 F (36.9 C) (06/20 0336) Pulse Rate:  [41-132] 99 (06/20 0800) Resp:  [13-28] 26 (06/20 0800) BP: (77-183)/(32-158) 93/56 mmHg (06/20 0800) SpO2:  [65 %-100 %] 95 % (06/20 0800) Arterial Line BP: (87-174)/(41-83) 104/46 mmHg (06/20 0800) FiO2 (%):  [40 %-60 %] 40 % (06/20 0400) Weight:  [182 lb 12.2 oz (82.9 kg)] 182 lb 12.2 oz (82.9 kg) (06/20 0200)  Intake/Output from previous day: 06/19 0701 - 06/20 0700 In: 6010.7 [I.V.:4879; NG/GT:905.7; IV Piggyback:186] Out: 682 [Urine:682] Intake/Output this shift: Total I/O In: 277.5 [I.V.:222.5; NG/GT:55] Out: -   No change in abdomen. Somewhat distended but soft. Extensive edema bilaterally lower extremities and upper extremities. No change in left thigh fullness.  Lab Results:  Recent Labs  12/29/15 1645 12/30/15 0201  WBC 17.5* 15.6*  HGB 7.4* 7.0*  HCT 21.7* 20.6*  PLT 60* 76*   BMET  Recent Labs  12/29/15 0306 12/30/15 0201  NA 143 144  K 3.3* 4.0  CL 116* 120*  CO2 20* 22  GLUCOSE 134* 144*  BUN 20 18  CREATININE 0.88 0.70  CALCIUM 7.6* 7.4*    Studies/Results: Dg Chest 1 View  12/25/2015  CLINICAL DATA:  Recent fall today with left hip pain, initial encounter EXAM: CHEST 1 VIEW COMPARISON:  None. FINDINGS: Cardiac shadow is within normal limits. The lungs are well aerated bilaterally. Mild interstitial changes are seen without focal infiltrate. No acute bony abnormality is seen. IMPRESSION: Mild interstitial changes likely of a chronic nature. No acute abnormality seen. Electronically Signed   By: Inez Catalina M.D.   On: 12/25/2015 21:17   Dg Lumbar Spine Complete  12/25/2015  CLINICAL DATA:  Fall today with low back pain, initial encounter EXAM: LUMBAR SPINE - COMPLETE 4+ VIEW COMPARISON:  None. FINDINGS: Five lumbar type vertebral bodies are well visualized. A  mild scoliosis concave to the right is noted. Reactive endplate changes are noted. No anterolisthesis is seen. No soft tissue abnormality is noted. IMPRESSION: Degenerative change without acute abnormality. Electronically Signed   By: Inez Catalina M.D.   On: 12/25/2015 21:17   Ct Head Wo Contrast  12/28/2015  CLINICAL DATA:  75 year old female with acute respiratory distress during venous thrombolysis. EXAM: CT HEAD WITHOUT CONTRAST TECHNIQUE: Contiguous axial images were obtained from the base of the skull through the vertex without intravenous contrast. COMPARISON:  CT 08/20/2002 FINDINGS: Note that there is venous contamination given the recent abdominal CT. No large acute intracranial hemorrhage. No midline shift or mass effect. Ventricular system unremarkable. Gray-white differentiation maintained. No significant white matter disease. Hyperdense mass along the inner table of the left frontal bone measures 5 mm x 10 mm, compatible with a small meningioma without local mass effect. This was identified (by report) on the CT of 08/20/2002. Unremarkable appearance of the calvarium without acute fracture or aggressive lesion. Unremarkable appearance of the scalp soft tissues. Unremarkable appearance of the bilateral orbits. Mastoid air cells are clear. No significant paranasal sinus disease IMPRESSION: No CT evidence of acute intracranial abnormality. 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 Signed, Dulcy Fanny. Earleen Newport, DO [facility] Electronically Signed   By: Corrie Mckusick D.O.   On: 12/28/2015 21:28   Ct Angio Chest Pe W Or Wo Contrast  12/27/2015  CLINICAL DATA:  Shortness of breath. Atrial mass versus blood clot seen on cardiac echo. EXAM: CT ANGIOGRAPHY CHEST WITH CONTRAST TECHNIQUE: Multidetector CT imaging of the chest was performed using the standard protocol during bolus administration of intravenous  contrast. Multiplanar CT image reconstructions and MIPs were obtained to evaluate the vascular anatomy. CONTRAST:  80 cc Isovue 370 intravenously. COMPARISON:  Chest radiograph 12/26/2015 FINDINGS: Mediastinum/Lymph Nodes: Left subclavian approach central venous catheter terminates in the superior vena cava. There are bilateral pulmonary emboli. There is a nearly completely occlusive thrombus within the left lower lobar pulmonary artery, with clot extending to the basilar segmental branches. Nonocclusive clot is seen within the left upper, lingular lobar pulmonary artery, right upper, right middle and right lower lobe pulmonary arteries. There is evidence of right heart strain with right/left ventricular ratio over 1. Irregular hypoattenuated area within the right atrium may represent mixing of non-opacified blood from the inferior vena cava with contrast opacified blood, versus an intramural thrombus. No masses or pathologically enlarged lymph nodes identified. Lungs/Pleura: There are bilateral moderate in size pleural effusions. There is segmental atelectasis of the right lower lobe and subsegmental atelectasis of the left lower lobe. Upper abdomen: Water density abdominal ascites. Musculoskeletal: No chest wall mass or suspicious bone lesions identified. Review of the MIP images confirms the above findings. IMPRESSION: Bilateral pulmonary emboli, nearly occlusive in the left lower lobe segmental branches, involving all of the segmental pulmonary arteries. Evidence of right heart strain with right/left heart ratio of over 1. Heterogeneous area of hypoattenuation within the right atrium may represent right atrial clot versus mixing of non-opacified blood from the inferior vena cava. Given presence of extensive bilateral pulmonary emboli, presence of atrial clot becomes more plausible. If clinically important, gated cardiac CT or cardiac MRI may be pursued. Bilateral pleural effusions and atelectatic changes of  bilateral lung bases. Abdominal ascites. Critical Value/emergent results were called by telephone at the time of interpretation on 12/27/2015 at 7:06 pm to Dr. Halford Chessman, who verbally acknowledged these results. Electronically Signed   By: Fidela Salisbury M.D.   On: 12/27/2015 19:19   Ct Pelvis W Contrast  12/26/2015  CLINICAL DATA:  76 year old with large pelvic hematoma following left hip replacement. Evaluate for pelvic bleeding. EXAM: CT PELVIS WITH CONTRAST TECHNIQUE: Multidetector CT imaging of the pelvis was performed using the standard protocol following the bolus administration of intravenous contrast. CONTRAST:  100 mL Isovue COMPARISON:  None. FINDINGS: Vascular structures: The distal abdominal aorta and common iliac arteries are patent. There is active contrast extravasation in the left hemipelvis originating from the proximal left common femoral artery or distal left external iliac artery. Bleeding is originating near the origin of the left inferior epigastric artery but may be separate from this vessel. The left common femoral artery and the proximal left femoral arteries are patent. The right iliac arteries and right femoral arteries are patent. The delayed images demonstrate additional contrast extravasation within the large hematoma within the anterior pelvis and lower abdomen. There is fluid tracking into the left abdomen. There is fluid and gas tracking up the left iliacus muscle. Expected gas around the left hip from the recent hip replacement. Fluid in the pelvis. Limited evaluation of pelvic structures due to the large amount of blood. The left hip arthroplasty is located. 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 IMPRESSION: Active bleeding in the pelvis. The bleeding is originating from the proximal left common femoral artery or the distal left external iliac artery. The bleeding is near the  origin of the left inferior epigastric artery. Large amount of blood and hematoma formation  throughout the abdomen and pelvis. Expected postsurgical changes from left hip arthroplasty. Electronically Signed   By: Markus Daft M.D.   On: 12/26/2015 18:55   Ir Angiogram Extremity Left  12/26/2015  INDICATION: 75 year old female with a history of a hip arthroplasty and hypotension. EXAM: IR EMBO ART VEN HEMORR LYMPH EXTRAV INC GUIDE ROADMAPPING; IR ULTRASOUND GUIDANCE VASC ACCESS RIGHT; ARTERIOGRAPHY; PELVIC SELECTIVE ARTERIOGRAPHY; LEFT EXTREMITY ARTERIOGRAPHY; ADDITIONAL ARTERIOGRAPHY MEDICATIONS: None ANESTHESIA/SEDATION: Patient was intubated. Anesthesia team managed the endotracheal airway CONTRAST:  50 cc Isovue FLUOROSCOPY TIME:  Fluoroscopy Time: 5 minutes 42 seconds COMPLICATIONS: None PROCEDURE: Informed consent was obtained from the patient following explanation of the procedure, risks, benefits and alternatives. The patient understands, agrees and consents for the procedure. All questions were addressed. A time out was performed prior to the initiation of the procedure. Maximal barrier sterile technique utilized including caps, mask, sterile gowns, sterile gloves, large sterile drape, hand hygiene, and Betadine prep. Patient positioned supine position on the fluoroscopy table. The right inguinal region was prepped and draped in the usual sterile fashion. Ultrasound survey of the right inguinal region was performed with images stored and sent to PACs. A micropuncture needle was used access the right common femoral artery under ultrasound. With excellent arterial blood flow returned, and an .018 micro wire was passed through the needle, observed enter the abdominal aorta under fluoroscopy. The needle was removed, and a micropuncture sheath was placed over the wire. The inner dilator and wire were removed, and an 035 Bentson wire was advanced under fluoroscopy into the abdominal aorta. The sheath was removed and a standard 5 [place] vascular sheath was placed. The dilator was removed and the sheath  was flushed. Omni Flush catheter was used to navigate a Bentson wire over the aortic bifurcation into the left iliac system. Catheter was exchanged for a C2 catheter advanced into the hypogastric artery with an angiogram performed. Catheter was withdrawn and redirected into the external iliac artery. Angiogram was performed. A Rosen wire was advanced into the proximal femoral system and the catheter was withdrawn. Six French right tip sheath was advanced over the bifurcation into the left iliac system. 7 mm x 4 cm balloon was advanced over the Rosen wire into the proximal left external iliac artery. Balloon was gently inflated to low atmospheric pressure, with inflation of approximately 4 minutes. At this time, measurements of the left external iliac artery were performed. Balloon was deflated, the balloon catheter was advanced into the proximal left femoral system, and the 035 Rosen wire was exchanged for an 018 S-V8 wire. Balloon catheter was removed. A 56mm x 93mm Viabahn stent graft was select thinned and advanced over the 018 wire to the targeted the avulsed artery. Stent graft was deployed. Balloon angioplasty to the nominal diameter of 6 mm was performed. Final angiogram was performed. The 6 French 35 cm right tip sheath was exchanged for a standard 10 cm 6 French sheath. Sheath was elected to remain given the patient's coagulopathy. Patient tolerated the procedure well with no significant blood loss. IMPRESSION: Status post left lower extremity angiogram with emergent covered stent embolization of distal external iliac artery branch vessel pseudoaneurysm/avulsion, with placement of stent graft. Signed, Dulcy Fanny. Earleen Newport, DO [facility] Electronically Signed   By: Corrie Mckusick D.O.   On: 12/26/2015 22:45   Ir Angiogram Pelvis Selective Or Supraselective  12/26/2015  INDICATION: 75 year old female with a history of a hip arthroplasty and hypotension.  EXAM: IR EMBO ART VEN HEMORR LYMPH EXTRAV INC GUIDE ROADMAPPING; IR ULTRASOUND GUIDANCE VASC ACCESS RIGHT; ARTERIOGRAPHY; PELVIC SELECTIVE ARTERIOGRAPHY; LEFT EXTREMITY ARTERIOGRAPHY; ADDITIONAL ARTERIOGRAPHY MEDICATIONS: None ANESTHESIA/SEDATION: Patient was intubated. Anesthesia team managed the endotracheal airway CONTRAST:  50 cc Isovue FLUOROSCOPY TIME:  Fluoroscopy Time: 5 minutes 42 seconds COMPLICATIONS: None PROCEDURE: Informed consent was obtained from the patient following explanation of the procedure, risks, benefits and alternatives. The patient understands, agrees and consents for the procedure. All questions were addressed. A time out was performed prior to the initiation of the procedure. Maximal barrier sterile technique utilized including caps, mask, sterile gowns, sterile gloves, large sterile drape, hand hygiene, and Betadine prep. Patient positioned supine position on the fluoroscopy table. The right inguinal region was prepped and draped in the usual sterile fashion. Ultrasound survey of the right inguinal region was performed with images stored and sent to PACs. A micropuncture needle was used access the right common femoral artery under ultrasound. With excellent arterial blood flow returned, and an .018 micro wire was passed through the needle, observed enter the abdominal aorta under fluoroscopy. The needle was removed, and a micropuncture sheath was placed over the wire. The inner dilator and wire were removed, and an 035 Bentson wire was advanced under fluoroscopy into the abdominal aorta. The sheath was removed and a standard 5 [place] vascular sheath was placed. The dilator was removed and the sheath was flushed. Omni Flush catheter was used to navigate a Bentson wire over the aortic bifurcation into the left iliac system. Catheter was exchanged for a C2 catheter advanced into the hypogastric artery with an angiogram performed. Catheter was withdrawn and redirected into the external  iliac artery. Angiogram was performed. A Rosen wire was advanced into the proximal femoral system and the catheter was withdrawn. Six French right tip sheath was advanced over the bifurcation into the left iliac system. 7 mm x 4 cm balloon was advanced over the Rosen wire into the proximal left external iliac artery. Balloon was gently inflated to low atmospheric pressure, with inflation of approximately 4 minutes. At this time, measurements of the left external iliac artery were performed. Balloon was deflated, the balloon catheter was advanced into the proximal left femoral system, and the 035 Rosen wire was exchanged for an 018 S-V8 wire. Balloon catheter was removed. A 7mm x 67mm Viabahn stent graft was select thinned and advanced over the 018 wire to the targeted the avulsed artery. Stent graft was deployed. Balloon angioplasty to the nominal diameter of 6 mm was performed. Final angiogram was performed. The 6 French 35 cm right tip sheath was exchanged for a standard 10 cm 6 French sheath. Sheath was elected to remain given the patient's coagulopathy. Patient tolerated the procedure well with no significant blood loss. IMPRESSION: Status post left lower extremity angiogram with emergent covered stent embolization of distal external iliac artery branch vessel pseudoaneurysm/avulsion, with placement of stent graft. Signed, Dulcy Fanny. Earleen Newport, DO [facility] Electronically Signed   By: Corrie Mckusick D.O.   On: 12/26/2015 22:45   Ir Angiogram Selective Each Additional Vessel  12/26/2015  INDICATION: 75 year old female with a history of a hip arthroplasty and hypotension. EXAM: IR EMBO ART VEN HEMORR LYMPH EXTRAV INC GUIDE ROADMAPPING; IR ULTRASOUND GUIDANCE VASC ACCESS RIGHT; ARTERIOGRAPHY; PELVIC SELECTIVE ARTERIOGRAPHY; LEFT EXTREMITY ARTERIOGRAPHY; ADDITIONAL ARTERIOGRAPHY MEDICATIONS: None ANESTHESIA/SEDATION: Patient was intubated. Anesthesia team  managed the  endotracheal airway CONTRAST:  50 cc Isovue FLUOROSCOPY TIME:  Fluoroscopy Time: 5 minutes 42 seconds COMPLICATIONS: None PROCEDURE: Informed consent was obtained from the patient following explanation of the procedure, risks, benefits and alternatives. The patient understands, agrees and consents for the procedure. All questions were addressed. A time out was performed prior to the initiation of the procedure. Maximal barrier sterile technique utilized including caps, mask, sterile gowns, sterile gloves, large sterile drape, hand hygiene, and Betadine prep. Patient positioned supine position on the fluoroscopy table. The right inguinal region was prepped and draped in the usual sterile fashion. Ultrasound survey of the right inguinal region was performed with images stored and sent to PACs. A micropuncture needle was used access the right common femoral artery under ultrasound. With excellent arterial blood flow returned, and an .018 micro wire was passed through the needle, observed enter the abdominal aorta under fluoroscopy. The needle was removed, and a micropuncture sheath was placed over the wire. The inner dilator and wire were removed, and an 035 Bentson wire was advanced under fluoroscopy into the abdominal aorta. The sheath was removed and a standard 5 [place] vascular sheath was placed. The dilator was removed and the sheath was flushed. Omni Flush catheter was used to navigate a Bentson wire over the aortic bifurcation into the left iliac system. Catheter was exchanged for a C2 catheter advanced into the hypogastric artery with an angiogram performed. Catheter was withdrawn and redirected into the external iliac artery. Angiogram was performed. A Rosen wire was advanced into the proximal femoral system and the catheter was withdrawn. Six French right tip sheath was advanced over the bifurcation into the left iliac system. 7 mm x 4 cm balloon was advanced over the Rosen wire into the proximal  left external iliac artery. Balloon was gently inflated to low atmospheric pressure, with inflation of approximately 4 minutes. At this time, measurements of the left external iliac artery were performed. Balloon was deflated, the balloon catheter was advanced into the proximal left femoral system, and the 035 Rosen wire was exchanged for an 018 S-V8 wire. Balloon catheter was removed. A 25mm x 55mm Viabahn stent graft was select thinned and advanced over the 018 wire to the targeted the avulsed artery. Stent graft was deployed. Balloon angioplasty to the nominal diameter of 6 mm was performed. Final angiogram was performed. The 6 French 35 cm right tip sheath was exchanged for a standard 10 cm 6 French sheath. Sheath was elected to remain given the patient's coagulopathy. Patient tolerated the procedure well with no significant blood loss. IMPRESSION: Status post left lower extremity angiogram with emergent covered stent embolization of distal external iliac artery branch vessel pseudoaneurysm/avulsion, with placement of stent graft. Signed, Dulcy Fanny. Earleen Newport, DO [facility] Radiology Electronically Signed   By: Corrie Mckusick D.O.   On: 12/26/2015 22:45   Ir Mariana Arn Ivc  12/28/2015  INDICATION: 75 year old female, status post postoperative hemorrhage left external iliac artery branch vessel, with embolization for hemostasis. Subsequent development of respiratory decompensation with cardiac echo demonstrating giant atrial thrombus. Surgical embolectomy carries a high morbidity/ mortality given the patient's current ICU status. Alternative treatment options include catheter embolectomy/thrombectomy, as well as catheter directed thrombolysis. Family has elected to proceed with least invasive therapy. EXAM: IR INFUSION THROMBOL ARTERIAL INITIAL (MS); IR ULTRASOUND GUIDANCE VASC ACCESS RIGHT; INFERIOR VENA CAVOGRAM COMPARISON:  CT pelvis 12/26/2015 CT chest  12/27/2015 Cardiac ECHO 12/27/2015 and 12/28/2015 MEDICATIONS: None. ANESTHESIA/SEDATION: Versed 0.5 mg IV; Fentanyl  0 mcg IV No moderate sedation The patient was continuously monitored during the procedure by the interventional radiology nurse under my direct supervision. FLUOROSCOPY TIME:  Fluoroscopy Time: 0 minutes 36 seconds (58 mGy). COMPLICATIONS: None TECHNIQUE: Informed written consent was obtained from the patient and the patient's husband after a thorough discussion of the procedural risks, benefits and alternatives. All questions were addressed. Maximal Sterile Barrier Technique was utilized including caps, mask, sterile gowns, sterile gloves, sterile drape, hand hygiene and skin antiseptic. A timeout was performed prior to the initiation of the procedure. Patient positioned supine position on the fluoroscopy table. The right inguinal region was prepped and draped in the usual sterile fashion. Ultrasound survey of the right inguinal region was performed with images stored and sent to PACs. A single wall 19 gauge needle was used access the right common femoral vein under ultrasound. With excellent venous blood flow returned, an 035 Bentson wire was passed through the needle, observed enter the abdominal IVC under fluoroscopy. The needle was removed, and a standard 5 [place] vascular sheath was placed. The dilator was removed and the sheath was flushed. Limited venogram was then performed of the right iliac vein and the IVC. A stiff Glidewire was then advanced to the inferior cavoatrial junction. A 20 cm infusion length Uni fuse catheter was then placed at the suprarenal IVC, with the tip terminating in the inferior right atrium. Final image was stored. Patient tolerated the procedure well and remained hemodynamically unchanged. No blood loss. FINDINGS: Ultrasound survey demonstrates patent right common femoral vein. Limited venogram of the right iliac vein and IVC demonstrates no thrombus of the visualized  right iliac system. Venous reflux into the left iliac system with slow flow through the IVC. No occlusive thrombus of the infrarenal or suprarenal IVC. There is poor definition of the known right atrial thrombus given the low volume injection. IMPRESSION: Status post right common femoral vein access for placement of thrombolysis catheter and initiation of pharmacologic thrombolysis to treat known right atrial thrombus. Signed, Dulcy Fanny. Earleen Newport, DO [facility] Radiology Electronically Signed   By: Corrie Mckusick D.O.   On: 12/28/2015 16:56   Ir Angiogram Follow Up Study  12/26/2015  INDICATION: 75 year old female with a history of a hip arthroplasty and hypotension. EXAM: IR EMBO ART VEN HEMORR LYMPH EXTRAV INC GUIDE ROADMAPPING; IR ULTRASOUND GUIDANCE VASC ACCESS RIGHT; ARTERIOGRAPHY; PELVIC SELECTIVE ARTERIOGRAPHY; LEFT EXTREMITY ARTERIOGRAPHY; ADDITIONAL ARTERIOGRAPHY MEDICATIONS: None ANESTHESIA/SEDATION: Patient was intubated. Anesthesia team managed the endotracheal airway CONTRAST:  50 cc Isovue FLUOROSCOPY TIME:  Fluoroscopy Time: 5 minutes 42 seconds COMPLICATIONS: None PROCEDURE: Informed consent was obtained from the patient following explanation of the procedure, risks, benefits and alternatives. The patient understands, agrees and consents for the procedure. All questions were addressed. A time out was performed prior to the initiation of the procedure. Maximal barrier sterile technique utilized including caps, mask, sterile gowns, sterile gloves, large sterile drape, hand hygiene, and Betadine prep. Patient positioned supine position on the fluoroscopy table. The right inguinal region was prepped and draped in the usual sterile fashion. Ultrasound survey of the right inguinal region was performed with images stored and sent to PACs. A micropuncture needle was used access the right common femoral artery under ultrasound. With excellent arterial blood flow  returned, and an .018 micro wire was passed through the needle, observed enter the abdominal aorta under fluoroscopy. The needle was removed, and a micropuncture sheath was placed over the wire. The inner dilator and  wire were removed, and an 035 Bentson wire was advanced under fluoroscopy into the abdominal aorta. The sheath was removed and a standard 5 [place] vascular sheath was placed. The dilator was removed and the sheath was flushed. Omni Flush catheter was used to navigate a Bentson wire over the aortic bifurcation into the left iliac system. Catheter was exchanged for a C2 catheter advanced into the hypogastric artery with an angiogram performed. Catheter was withdrawn and redirected into the external iliac artery. Angiogram was performed. A Rosen wire was advanced into the proximal femoral system and the catheter was withdrawn. Six French right tip sheath was advanced over the bifurcation into the left iliac system. 7 mm x 4 cm balloon was advanced over the Rosen wire into the proximal left external iliac artery. Balloon was gently inflated to low atmospheric pressure, with inflation of approximately 4 minutes. At this time, measurements of the left external iliac artery were performed. Balloon was deflated, the balloon catheter was advanced into the proximal left femoral system, and the 035 Rosen wire was exchanged for an 018 S-V8 wire. Balloon catheter was removed. A 51mm x 65mm Viabahn stent graft was select thinned and advanced over the 018 wire to the targeted the avulsed artery. Stent graft was deployed. Balloon angioplasty to the nominal diameter of 6 mm was performed. Final angiogram was performed. The 6 French 35 cm right tip sheath was exchanged for a standard 10 cm 6 French sheath. Sheath was elected to remain given the patient's coagulopathy. Patient tolerated the procedure well with no significant blood loss. IMPRESSION: Status post left lower extremity angiogram with emergent covered stent  embolization of distal external iliac artery branch vessel pseudoaneurysm/avulsion, with placement of stent graft. Signed, Dulcy Fanny. Earleen Newport, DO [facility] Radiology Electronically Signed   By: Corrie Mckusick D.O.   On: 12/26/2015 22:45   Ir US Guide Vasc Access Right  12/28/2015  INDICATION: 75 year old female, status post postoperative hemorrhage left external iliac artery branch vessel, with embolization for hemostasis. Subsequent development of respiratory decompensation with cardiac echo demonstrating giant atrial thrombus. Surgical embolectomy carries a high morbidity/ mortality given the patient's current ICU status. Alternative treatment options include catheter embolectomy/thrombectomy, as well as catheter directed thrombolysis. Family has elected to proceed with least invasive therapy. EXAM: IR INFUSION THROMBOL ARTERIAL INITIAL (MS); IR ULTRASOUND GUIDANCE VASC ACCESS RIGHT; INFERIOR VENA CAVOGRAM COMPARISON:  CT pelvis 12/26/2015 CT chest 12/27/2015 Cardiac ECHO 12/27/2015 and 12/28/2015 MEDICATIONS: None. ANESTHESIA/SEDATION: Versed 0.5 mg IV; Fentanyl 0 mcg IV No moderate sedation The patient was continuously monitored during the procedure by the interventional radiology nurse under my direct supervision. FLUOROSCOPY TIME:  Fluoroscopy Time: 0 minutes 36 seconds (58 mGy). COMPLICATIONS: None TECHNIQUE: Informed written consent was obtained from the patient and the patient's husband after a thorough discussion of the procedural risks, benefits and alternatives. All questions were addressed. Maximal Sterile Barrier Technique was utilized including caps, mask, sterile gowns, sterile gloves, sterile drape, hand hygiene and skin antiseptic. A timeout was performed prior to the initiation of the procedure. Patient positioned supine position on the fluoroscopy table. The right inguinal region was prepped and draped in the usual sterile fashion. Ultrasound  survey of the right inguinal region was performed with images stored and sent to PACs. A single wall 19 gauge needle was used access the right common femoral vein under ultrasound. With excellent venous blood flow returned, an 035 Bentson wire was passed through the needle, observed enter the abdominal  IVC under fluoroscopy. The needle was removed, and a standard 5 [place] vascular sheath was placed. The dilator was removed and the sheath was flushed. Limited venogram was then performed of the right iliac vein and the IVC. A stiff Glidewire was then advanced to the inferior cavoatrial junction. A 20 cm infusion length Uni fuse catheter was then placed at the suprarenal IVC, with the tip terminating in the inferior right atrium. Final image was stored. Patient tolerated the procedure well and remained hemodynamically unchanged. No blood loss. FINDINGS: Ultrasound survey demonstrates patent right common femoral vein. Limited venogram of the right iliac vein and IVC demonstrates no thrombus of the visualized right iliac system. Venous reflux into the left iliac system with slow flow through the IVC. No occlusive thrombus of the infrarenal or suprarenal IVC. There is poor definition of the known right atrial thrombus given the low volume injection. IMPRESSION: Status post right common femoral vein access for placement of thrombolysis catheter and initiation of pharmacologic thrombolysis to treat known right atrial thrombus. Signed, Dulcy Fanny. Earleen Newport, DO [facility] Radiology Electronically Signed   By: Corrie Mckusick D.O.   On: 12/28/2015 16:56   Ir US Guide Vasc Access Right  12/26/2015  INDICATION: 75 year old female with a history of a hip arthroplasty and hypotension. EXAM: IR EMBO ART VEN HEMORR LYMPH EXTRAV INC GUIDE ROADMAPPING; IR ULTRASOUND GUIDANCE VASC ACCESS RIGHT; ARTERIOGRAPHY; PELVIC SELECTIVE ARTERIOGRAPHY; LEFT EXTREMITY ARTERIOGRAPHY; ADDITIONAL  ARTERIOGRAPHY MEDICATIONS: None ANESTHESIA/SEDATION: Patient was intubated. Anesthesia team managed the endotracheal airway CONTRAST:  50 cc Isovue FLUOROSCOPY TIME:  Fluoroscopy Time: 5 minutes 42 seconds COMPLICATIONS: None PROCEDURE: Informed consent was obtained from the patient following explanation of the procedure, risks, benefits and alternatives. The patient understands, agrees and consents for the procedure. All questions were addressed. A time out was performed prior to the initiation of the procedure. Maximal barrier sterile technique utilized including caps, mask, sterile gowns, sterile gloves, large sterile drape, hand hygiene, and Betadine prep. Patient positioned supine position on the fluoroscopy table. The right inguinal region was prepped and draped in the usual sterile fashion. Ultrasound survey of the right inguinal region was performed with images stored and sent to PACs. A micropuncture needle was used access the right common femoral artery under ultrasound. With excellent arterial blood flow returned, and an .018 micro wire was passed through the needle, observed enter the abdominal aorta under fluoroscopy. The needle was removed, and a micropuncture sheath was placed over the wire. The inner dilator and wire were removed, and an 035 Bentson wire was advanced under fluoroscopy into the abdominal aorta. The sheath was removed and a standard 5 [place] vascular sheath was placed. The dilator was removed and the sheath was flushed. Omni Flush catheter was used to navigate a Bentson wire over the aortic bifurcation into the left iliac system. Catheter was exchanged for a C2 catheter advanced into the hypogastric artery with an angiogram performed. Catheter was withdrawn and redirected into the external iliac artery. Angiogram was performed. A Rosen wire was advanced into the proximal femoral system and the catheter was withdrawn. Six French right tip sheath was advanced over the bifurcation into  the left iliac system. 7 mm x 4 cm balloon was advanced over the Rosen wire into the proximal left external iliac artery. Balloon was gently inflated to low atmospheric pressure, with inflation of approximately 4 minutes. At this time, measurements of the left external iliac artery were performed. Balloon was deflated, the balloon  catheter was advanced into the proximal left femoral system, and the 035 Rosen wire was exchanged for an 018 S-V8 wire. Balloon catheter was removed. A 70mm x 19mm Viabahn stent graft was select thinned and advanced over the 018 wire to the targeted the avulsed artery. Stent graft was deployed. Balloon angioplasty to the nominal diameter of 6 mm was performed. Final angiogram was performed. The 6 French 35 cm right tip sheath was exchanged for a standard 10 cm 6 French sheath. Sheath was elected to remain given the patient's coagulopathy. Patient tolerated the procedure well with no significant blood loss. IMPRESSION: Status post left lower extremity angiogram with emergent covered stent embolization of distal external iliac artery branch vessel pseudoaneurysm/avulsion, with placement of stent graft. Signed, Dulcy Fanny. Earleen Newport, DO [facility] Radiology Electronically Signed   By: Corrie Mckusick D.O.   On: 12/26/2015 22:45   Dg Chest Port 1 View  12/30/2015  CLINICAL DATA:  Shortness of breath. EXAM: PORTABLE CHEST 1 VIEW COMPARISON:  12/29/2015. FINDINGS: Endotracheal tube, NG tube, left subclavian line stable position. Cardiomegaly with diffuse bilateral pulmonary infiltrates pleural effusions consistent with congestive heart failure. No pneumothorax. IMPRESSION: 1. Lines and tubes stable position. 2. Cardiomegaly with diffuse bilateral pulmonary infiltrates and bilateral pleural effusions consistent with congestive heart failure. Electronically Signed   By: Marcello Moores  Register   On: 12/30/2015 06:48   Dg Chest Port 1 View  12/29/2015   CLINICAL DATA:  75 year old female with a history of left hip replacement, postoperative hemorrhage and subsequent embolization, and development of pulmonary emboli. 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 EXAM: PORTABLE CHEST 1 VIEW COMPARISON:  Multiple prior FINDINGS: Cardiomediastinal silhouette unchanged in size and contour. Similar position of defibrillator pads on the chest wall. Unchanged position of the endotracheal tube, which terminates suitably above the carina approximately 3.4 cm. Gastric tube unchanged projecting over the mediastinum and terminating and out of the field of view. Unchanged position of left subclavian central catheter which terminates in the superior vena cava. Opacities at the bilateral lung bases partially obscuring the hemidiaphragms and the heart borders. Veiled opacity in the lower lungs. Mixed interstitial and airspace opacities in the mid and lower lobes. No pneumothorax. No displaced rib fracture. IMPRESSION: Opacities the bilateral lung bases, likely a combination of pleural effusion and atelectasis/ consolidation. Unchanged position of support apparatus, including defibrillator pads, gastric tube, left subclavian central venous catheter, and endotracheal tube which terminates suitably above the carina. Signed, Dulcy Fanny. Earleen Newport, DO [facility] Radiology Electronically Signed   By: Corrie Mckusick D.O.   On: 12/29/2015 07:05   Dg Chest Port 1 View  12/28/2015  CLINICAL DATA:  Evaluate endotracheal and orogastric tube placement. Acute respiratory failure. EXAM: PORTABLE CHEST 1 VIEW COMPARISON:  Chest radiograph December 28, 2015 at 0916 hours FINDINGS: Endotracheal tube tip projects 4.8 cm above the carina. Nasogastric tube past proximal stomach, not imaged. Nasogastric tube side port projects in proximal stomach. Stable appearance of LEFT subclavian central venous catheter with  distal tip projecting in mid superior vena cava. Similar moderate RIGHT pleural effusion. Bibasilar strandy densities. Biapical pleural capping. No pneumothorax. Soft tissue planes and included osseous structures are nonsuspicious. IMPRESSION: Endotracheal tube tip projects 4.8 cm above the carina. Nasogastric tube past proximal stomach. No change in LEFT subclavian central venous catheter. Moderate RIGHT pleural effusion with bibasilar atelectasis. Electronically Signed   By: Thana Farr.D.  On: 12/28/2015 19:38   Dg Chest Port 1 View  12/28/2015  CLINICAL DATA:  Hypoxia.  Recent diagnosis of pulmonary embolism. EXAM: PORTABLE CHEST 1 VIEW COMPARISON:  Chest CT 12/27/2015. FINDINGS: Central venous catheter tip projects over the superior vena cava. Monitoring leads project over the patient. Stable cardiac and mediastinal contours. Moderate layering bilateral pleural effusions and underlying opacities within the mid lower lungs bilaterally. No pneumothorax. IMPRESSION: Moderate layering bilateral pleural effusions with underlying opacities which may represent atelectasis. Electronically Signed   By: Lovey Newcomer M.D.   On: 12/28/2015 10:53   Dg Chest Port 1 View  12/26/2015  CLINICAL DATA:  Patient status post ET tube placement. EXAM: PORTABLE CHEST 1 VIEW COMPARISON:  Chest radiograph 12/25/2015. FINDINGS: ET tube terminates in the mid trachea. Left subclavian central venous catheter tip projects over the superior vena cava. Enteric tube tip and side port terminates in the midesophagus, recommend advancement. Stable cardiac and mediastinal contours. Low lung volumes. Elevation of the right hemidiaphragm. Right-greater-than-left interstitial pulmonary opacities. No large pleural effusion or pneumothorax. IMPRESSION: ET tube tip and side port terminate in the midesophagus, recommend advancement. Bilateral, left-greater-than-right, interstitial opacities which may represent pulmonary edema. These results  will be called to the ordering clinician or representative by the Radiologist Assistant, and communication documented in the PACS or zVision Dashboard. Electronically Signed   By: Lovey Newcomer M.D.   On: 12/26/2015 21:07   Dg Hip Operative Unilat W Or W/o Pelvis Left  12/26/2015  CLINICAL DATA:  Left anterior hip replacement due to fracture. EXAM: OPERATIVE LEFT HIP (WITH PELVIS IF PERFORMED) 1 VIEW TECHNIQUE: Fluoroscopic spot image(s) were submitted for interpretation post-operatively. COMPARISON:  12/25/2015 FINDINGS: A total left hip arthroplasty has been performed. Alignment of the prosthesis is grossly normal on this single view. No evidence for a periprosthetic fracture. IMPRESSION: Left hip replacement without complicating features. Electronically Signed   By: Markus Daft M.D.   On: 12/26/2015 16:34   Dg Hip Unilat With Pelvis 2-3 Views Left  12/25/2015  CLINICAL DATA:  Recent fall with hip pain, initial encounter EXAM: DG HIP (WITH OR WITHOUT PELVIS) 2-3V LEFT COMPARISON:  None. FINDINGS: There is left femoral neck fracture with impaction and angulation at the fracture site. Pelvic ring is intact. No other focal abnormality is seen. IMPRESSION: Left femoral neck fracture. Electronically Signed   By: Inez Catalina M.D.   On: 12/25/2015 21:16   Dg Femur 1v Left  12/25/2015  CLINICAL DATA:  [place] with left hip pain. EXAM: LEFT FEMUR 1 VIEW COMPARISON:  Left hip 12/25/2015 FINDINGS: Single view of the left femur was obtained. There is a fracture involving the proximal left femur at the junction of the femoral head and neck. There is superior displacement of the left femoral neck. Findings are suggestive for a subcapital hip fracture. The mid and distal femur appear to be intact on this single view. IMPRESSION: Fracture of the proximal left femur. Findings are compatible with a subcapital hip fracture. Electronically Signed   By: Markus Daft M.D.   On: 12/25/2015 21:17   [place] Guide Roadmapping  12/26/2015  INDICATION: 75 year old female with a history of a hip arthroplasty and hypotension. EXAM: IR EMBO ART VEN HEMORR LYMPH EXTRAV INC GUIDE ROADMAPPING; IR ULTRASOUND GUIDANCE VASC ACCESS RIGHT; ARTERIOGRAPHY; PELVIC SELECTIVE ARTERIOGRAPHY; LEFT EXTREMITY ARTERIOGRAPHY; ADDITIONAL ARTERIOGRAPHY MEDICATIONS: None ANESTHESIA/SEDATION: Patient was intubated. Anesthesia team managed the endotracheal airway CONTRAST:  50 cc  Isovue FLUOROSCOPY TIME:  Fluoroscopy Time: 5 minutes 42 seconds COMPLICATIONS: None PROCEDURE: Informed consent was obtained from the patient following explanation of the procedure, risks, benefits and alternatives. The patient understands, agrees and consents for the procedure. All questions were addressed. A time out was performed prior to the initiation of the procedure. Maximal barrier sterile technique utilized including caps, mask, sterile gowns, sterile gloves, large sterile drape, hand hygiene, and Betadine prep. Patient positioned supine position on the fluoroscopy table. The right inguinal region was prepped and draped in the usual sterile fashion. Ultrasound survey of the right inguinal region was performed with images stored and sent to PACs. A micropuncture needle was used access the right common femoral artery under ultrasound. With excellent arterial blood flow returned, and an .018 micro wire was passed through the needle, observed enter the abdominal aorta under fluoroscopy. The needle was removed, and a micropuncture sheath was placed over the wire. The inner dilator and wire were removed, and an 035 Bentson wire was advanced under fluoroscopy into the abdominal aorta. The sheath was removed and a standard 5 [place] vascular sheath was placed. The dilator was removed and the sheath was flushed. Omni Flush catheter was used to navigate a Bentson wire over the aortic bifurcation into the left iliac system. Catheter was exchanged for a C2  catheter advanced into the hypogastric artery with an angiogram performed. Catheter was withdrawn and redirected into the external iliac artery. Angiogram was performed. A Rosen wire was advanced into the proximal femoral system and the catheter was withdrawn. Six French right tip sheath was advanced over the bifurcation into the left iliac system. 7 mm x 4 cm balloon was advanced over the Rosen wire into the proximal left external iliac artery. Balloon was gently inflated to low atmospheric pressure, with inflation of approximately 4 minutes. At this time, measurements of the left external iliac artery were performed. Balloon was deflated, the balloon catheter was advanced into the proximal left femoral system, and the 035 Rosen wire was exchanged for an 018 S-V8 wire. Balloon catheter was removed. A 29mm x 40mm Viabahn stent graft was select thinned and advanced over the 018 wire to the targeted the avulsed artery. Stent graft was deployed. Balloon angioplasty to the nominal diameter of 6 mm was performed. Final angiogram was performed. The 6 French 35 cm right tip sheath was exchanged for a standard 10 cm 6 French sheath. Sheath was elected to remain given the patient's coagulopathy. Patient tolerated the procedure well with no significant blood loss. IMPRESSION: Status post left lower extremity angiogram with emergent covered stent embolization of distal external iliac artery branch vessel pseudoaneurysm/avulsion, with placement of stent graft. Signed, Dulcy Fanny. Earleen Newport, DO [facility] Special[facility] Radiology Electronically Signed   By: Corrie Mckusick D.O.   On: 12/26/2015 22:45   Ct Angio Abd/pel W/ And/or W/o  12/28/2015  CLINICAL DATA:  75 year old female with left hip arthroplasty and postoperative hemorrhage. Discovery of rate atrial thrombus, with respiratory compromise. Status post initiation of catheter directed thrombolysis of atrial thrombus via right common femoral  vein approach. EXAM: CT ANGIOGRAPHY ABDOMEN AND PELVIS TECHNIQUE: Multidetector CT imaging through the abdomen and pelvis was performed using the standard protocol during bolus administration of intravenous contrast. Multiplanar reconstructed images including MIPs were obtained and reviewed to evaluate the vascular anatomy. CONTRAST:  100 cc Isovue 370 COMPARISON:  CT chest 12/27/2015, CT pelvis 12/26/2015 FINDINGS: LOWER CHEST: Nonvascular: Unremarkable appearance of the superficial soft tissues the  chest. Heart size within normal limits. Tip of left subclavian central venous catheter partially visualized terminating in the superior vena cava. Tip of the lower extremity lytic catheter terminates in the right atrium. Questionable hypoperfusion of the myocardium. Small bilateral pleural effusions with associated atelectasis. Gastric tube continues through the esophagus terminating in the stomach. Aeration of the bilateral lower lobes improved compared to the prior CT, status post intubation with positive pressure ventilation. Endotracheal tube terminates above the carina. Vascular: Normal course caliber and contour of the thoracic aorta. Minimal atherosclerotic changes. No dissection or aneurysm. No ulceration. Timing is not optimized for the evaluation of the pulmonary arteries, however, there is no central, lobar filling defects. There are a few visualized segmental/subsegmental filling defects in the distribution of the comparison CT, with improved thrombus burden of the bilateral lower lobes. There is no large filling defect identified within the right atrium on the CT. ABDOMEN PELVIS: Nonvascular: Intermediate density fluid layered in the bilateral subdiaphragmatic space tracking along the bilateral pericolic gutter. Mixed density fluid in the pelvis, predominantly extraperitoneal (within the space of Retzius. Overall volume appears decreased from the comparison CT of the pelvis performed 12/26/2015 largest  component on today's study measures approximately 16.3 cm by 11 cm, compared to 15.5 cm x 15 cm. Unremarkable appearance of liver, spleen. Unremarkable appearance of the pancreas. Unremarkable appearance of the bilateral adrenal glands. No evidence of hydronephrosis. Unremarkable course of the bilateral ureters, which are partially opacified with contrast. High density material within the gallbladder, compatible with vicarious excretion of contrast. Urinary bladder decompressed with catheter in position. Vascular: Normal course caliber and contour of the abdominal aorta. Scattered calcifications present. No aneurysm or dissection flap. No periaortic fluid. Celiac artery and superior mesenteric artery are patent. Bilateral renal arteries patent with no significant stenosis. Inferior mesenteric artery patent. Right lower extremity: Normal course caliber and contour of the right external iliac artery without aneurysm or dissection flap. No active extravasation or evidence of arterial injury. Right hypogastric arteries patent including anterior and posterior branches. Proximal right femoral arteries patent. Left lower extremity: Normal course caliber and contour of the left common iliac artery. No aneurysm or dissection. No occlusion. Hypogastric arteries patent including anterior posterior branches. No evidence of active extravasation or contrast pooling involving the hypogastric territory. External iliac artery is patent. Vascular stent in place at the site of previously identified injury. Stent terminates at the level of the inguinal ligament. Proximal left femoral arteries unremarkable with no active extravasation. There is enlarging hematoma of the proximal left thigh compared to the prior pelvis CT. Hematoma measures approximately 10.5 cm in greatest diameter, increased from 9.3 cm on the prior. Hematoma centered within the anterior and lateral rectus musculature. There is a tiny linear hyperdensity centered  within the hematoma on the lateral aspect, potentially site of hemorrhage. No significant pooling on the post contrast images. Unremarkable appearance of the venous system. Venous catheter present within the right femoral system, traversing the IVC. The left external iliac and common iliac vein are compressed by a pelvic hematoma. Musculoskeletal: Surgical changes of left hip arthroplasty. Mild degenerative changes of the spine. No displaced fracture is identified. Review of the MIP images confirms the above findings. IMPRESSION: No evidence of new intraperitoneal or pelvic hemorrhage to account for new back pain. Re- demonstration of extraperitoneal and intraperitoneal hemorrhage, with decreasing size of the largest component in the space of Retzius and within the extraperitoneal pelvis. There is increasing hematoma in the left proximal thigh, with  dense focus centered within the musculature which may indicate venous bleeding or small arterial bleeding. This detail was discussed with the managing physicians at the time of the CT scan. Patent left external iliac stent, status post embolization. Questionable hypodensity of the left ventricle myocardium, potentially indicating hypoperfusion. Compared to the prior CT of the chest, there is decreasing appreciable volume of pulmonary embolus in the bilateral lower lobes. No large right atrial filling defect identified on the delayed images. Small bilateral pleural effusions with associated atelectasis. Signed, Dulcy Fanny. Earleen Newport, DO [facility] Radiology Electronically Signed   By: Corrie Mckusick D.O.   On: 12/28/2015 21:10   Ir Infusion Thrombol Venous Initial (ms)  12/28/2015  INDICATION: 75 year old female, status post postoperative hemorrhage left external iliac artery branch vessel, with embolization for hemostasis. Subsequent development of respiratory decompensation with cardiac echo demonstrating giant atrial  thrombus. Surgical embolectomy carries a high morbidity/ mortality given the patient's current ICU status. Alternative treatment options include catheter embolectomy/thrombectomy, as well as catheter directed thrombolysis. Family has elected to proceed with least invasive therapy. EXAM: IR INFUSION THROMBOL ARTERIAL INITIAL (MS); IR ULTRASOUND GUIDANCE VASC ACCESS RIGHT; INFERIOR VENA CAVOGRAM COMPARISON:  CT pelvis 12/26/2015 CT chest 12/27/2015 Cardiac ECHO 12/27/2015 and 12/28/2015 MEDICATIONS: None. ANESTHESIA/SEDATION: Versed 0.5 mg IV; Fentanyl 0 mcg IV No moderate sedation The patient was continuously monitored during the procedure by the interventional radiology nurse under my direct supervision. FLUOROSCOPY TIME:  Fluoroscopy Time: 0 minutes 36 seconds (58 mGy). COMPLICATIONS: None TECHNIQUE: Informed written consent was obtained from the patient and the patient's husband after a thorough discussion of the procedural risks, benefits and alternatives. All questions were addressed. Maximal Sterile Barrier Technique was utilized including caps, mask, sterile gowns, sterile gloves, sterile drape, hand hygiene and skin antiseptic. A timeout was performed prior to the initiation of the procedure. Patient positioned supine position on the fluoroscopy table. The right inguinal region was prepped and draped in the usual sterile fashion. Ultrasound survey of the right inguinal region was performed with images stored and sent to PACs. A single wall 19 gauge needle was used access the right common femoral vein under ultrasound. With excellent venous blood flow returned, an 035 Bentson wire was passed through the needle, observed enter the abdominal IVC under fluoroscopy. The needle was removed, and a standard 5 [place] vascular sheath was placed. The dilator was removed and the sheath was flushed. Limited venogram was then performed of the right iliac vein and the IVC. A stiff Glidewire was then advanced to the inferior  cavoatrial junction. A 20 cm infusion length Uni fuse catheter was then placed at the suprarenal IVC, with the tip terminating in the inferior right atrium. Final image was stored. Patient tolerated the procedure well and remained hemodynamically unchanged. No blood loss. FINDINGS: Ultrasound survey demonstrates patent right common femoral vein. Limited venogram of the right iliac vein and IVC demonstrates no thrombus of the visualized right iliac system. Venous reflux into the left iliac system with slow flow through the IVC. No occlusive thrombus of the infrarenal or suprarenal IVC. There is poor definition of the known right atrial thrombus given the low volume injection. IMPRESSION: Status post right common femoral vein access for placement of thrombolysis catheter and initiation of pharmacologic thrombolysis to treat known right atrial thrombus. Signed, Dulcy Fanny. Earleen Newport, DO [facility] Radiology Electronically Signed   By: Corrie Mckusick D.O.   On: 12/28/2015 16:56   Anti-infectives: Anti-infectives  Start     Dose/Rate Route Frequency Ordered Stop   12/26/15 2100  ceFAZolin (ANCEF) IVPB 2g/100 mL premix     2 g 200 mL/hr over 30 Minutes Intravenous Every 6 hours 12/26/15 2023 12/27/15 0418   12/26/15 1300  ceFAZolin (ANCEF) IVPB 2g/100 mL premix     2 g 200 mL/hr over 30 Minutes Intravenous To ShortStay Surgical 12/26/15 1212 12/26/15 1518      Assessment/Plan: s/p Procedure(s): TOTAL HIP ARTHROPLASTY ANTERIOR APPROACH (Left) Nothing to add from a vascular surgical standpoint. Continue support.   LOS: 5 days   Evrett Hakim 12/30/2015, 8:20 AM   2

## 2015-12-30 NOTE — Progress Notes (Signed)
ANTICOAGULATION CONSULT NOTE - Follow Up Consult  Pharmacy Consult for Heparin Indication: pulmonary embolus and and history of atrial fibrillation  No Known Allergies  Patient Measurements: Height: 5\' 7"  (170.2 cm) Weight: 182 lb 12.2 oz (82.9 kg) IBW/kg (Calculated) : 61.6 Heparin Dosing Weight: 77 kg  Vital Signs: Temp: 99.7 F (37.6 C) (06/20 1140) Temp Source: Oral (06/20 1140) BP: 110/77 mmHg (06/20 1100) Pulse Rate: 102 (06/20 1100)  Labs:  Recent Labs  12/27/15 1526  12/28/15 0038  12/29/15 0306 12/29/15 1034 12/29/15 1645 12/30/15 0201 12/30/15 1000  HGB  --   < >  --   < > 9.0* 8.0* 7.4* 7.0*  --   HCT  --   < >  --   < > 26.2* 23.4* 21.7* 20.6*  --   PLT  --   --   --   < > 64* 55* 60* 76*  --   HEPARINUNFRC  --   < >  --   < >  --   --  0.19* 0.24* 0.29*  CREATININE  --   --  0.84  --  0.88  --   --  0.70  --   TROPONINI 2.06*  --   --   --   --   --   --   --   --   < > = values in this interval not displayed.  Estimated Creatinine Clearance: 68.3 mL/min (by C-G formula based on Cr of 0.7).   Medical History: Past Medical History  Diagnosis Date  . Medical history non-contributory   . Acute pulmonary embolism ([place]) 12/28/2015   Assessment: 75 year old woman admitted 12/25/2015 for mechanical fall and femoral neck fracture, now s/p THA on 12/26/15. Found to have a large mobile mass in RA and bilateral PE with RV strain, s/p EKOS on 6/18. Pharmacy consulted to dose heparin.  Hospital course complicated by PEA arrest on 6/18 PM with ROSC in 42min. Received 2 units PRBCs and 2 of Cryo -lowest Hgb 8.2.  Heparin and alteplase were held, no surgical exploration d/t unknown source CV collapse. CT scan showed no increase in pelvic hematoma and some additional hematoma in left thigh.   HL 0.29 slightly subtherapeutic on heparin 750 units/h, however Hgb 7, plt 76 slowing trending up. No overt bleeding noted.   Goal of Therapy:  Heparin level 0.3-0.7  units/ml Monitor platelets by anticoagulation protocol: Yes   Plan:  Continue heparin 750 units/hr  Check heparin level in 6 hours.  Daily heparin level and CBC.  Monitor closely for signs and symptoms of bleeding.    Heloise Ochoa, [place].D., BCPS PGY2 Cardiology Pharmacy Resident Pager: [phone]  12/30/2015,11:55 AM

## 2015-12-30 NOTE — Progress Notes (Signed)
ANTICOAGULATION CONSULT NOTE - Follow Up Consult  Pharmacy Consult for Heparin Indication: pulmonary embolus and and history of atrial fibrillation  No Known Allergies  Patient Measurements: Height: 5\' 7"  (170.2 cm) Weight: 182 lb 12.2 oz (82.9 kg) IBW/kg (Calculated) : 61.6 Heparin Dosing Weight: 77 kg  Vital Signs: Temp: 101 F (38.3 C) (06/20 1513) Temp Source: Oral (06/20 1513) BP: 106/63 mmHg (06/20 1500) Pulse Rate: 109 (06/20 1500)  Labs:  Recent Labs  12/28/15 0038  12/29/15 0306 12/29/15 1034 12/29/15 1645 12/30/15 0201 12/30/15 1000 12/30/15 1528  HGB  --   < > 9.0* 8.0* 7.4* 7.0*  --   --   HCT  --   < > 26.2* 23.4* 21.7* 20.6*  --   --   PLT  --   < > 64* 55* 60* 76*  --   --   HEPARINUNFRC  --   < >  --   --  0.19* 0.24* 0.29* 0.37  CREATININE 0.84  --  0.88  --   --  0.70  --   --   < > = values in this interval not displayed.  Estimated Creatinine Clearance: 68.3 mL/min (by C-G formula based on Cr of 0.7).   Medical History: Past Medical History  Diagnosis Date  . Medical history non-contributory   . Acute pulmonary embolism ([place]) 12/28/2015   Assessment: 75 year old woman admitted 12/25/2015 for mechanical fall and femoral neck fracture, now s/p THA on 12/26/15. Found to have a large mobile mass in RA and bilateral PE with RV strain, s/p EKOS on 6/18. Pharmacy consulted to dose heparin.  Hospital course complicated by PEA arrest on 6/18 PM with ROSC in 56min. Received 2 units PRBCs and 2 of Cryo. Heparin and alteplase were held, no surgical exploration d/t unknown source CV collapse. CT scan showed no increase in pelvic hematoma and some additional hematoma in left thigh.   HL 0.37- now trended to therapeutic on heparin 750 units/h. Hgb 7, plt 76 slowing trending up. No overt bleeding noted. No other bleeding complications  Goal of Therapy:  Heparin level 0.3-0.7 units/ml Monitor platelets by anticoagulation protocol: Yes   Plan:  Continue  heparin 750 units/hr  Daily heparin level and CBC Monitor closely for signs and symptoms of bleeding  Cleatus Goodin D. Kloe Oates, PharmD, BCPS Clinical Pharmacist Pager: [phone] 12/30/2015 4:02 PM

## 2015-12-30 NOTE — Progress Notes (Signed)
[place] Progress Note Patient Name: Bonnie Salazar DOB: [birth year] MRN: [id number]   Date of Service  12/30/2015  HPI/Events of Note  Fever - Temp = 101.0 F. WBC = 15.6.  eICU Interventions  Will order: 1. Blood cultures X 2.  2. Tracheal aspirate culture. 3. UA with reflex microscopic. 4. Vancomycin and Zosyn per pharmacy consultation. 5. Tylenol liquid 650 mg via tube Q 6 hours PRN Temp > 101.5 F.     Intervention Category Major Interventions: Infection - evaluation and management  [name] 12/30/2015, 6:12 PM

## 2015-12-30 NOTE — Care Management Note (Signed)
Case Management Note  Patient Details  Name: LYDA LUNDIN MRN: [id number] Date of Birth: 03/23/1941  Subjective/Objective:    Pt s/p hip surgery - had PEA arrest - currently intubated     Action/Plan:  Pt if from home with husband.  CM will continue to monitor for disposition needs   Expected Discharge Date:                  Expected Discharge Plan:  [place]  In-House Referral:     Discharge planning Services  CM Consult  Post Acute Care Choice:    Choice offered to:     DME Arranged:    DME Agency:     HH Arranged:    HH Agency:     Status of Service:  In process, will continue to follow  Medicare Important Message Given:  Yes Date Medicare IM Given:    Medicare IM give by:    Date Additional Medicare IM Given:    Additional Medicare Important Message give by:     If discussed at [place] of Stay Meetings, dates discussed:    Additional Comments:  Maryclare Labrador, RN 12/30/2015, 10:11 AM

## 2015-12-30 NOTE — Progress Notes (Signed)
Pharmacy Antibiotic Note  Bonnie Salazar is a 75 y.o. female admitted on 12/25/2015 with fever of unknown source.  Pharmacy has been consulted for vancomycin and Zosyn dosing.  Plan: -Vancomycin 1500mg  IV x1 as loading dose, then 1g IV every 12 hours.  Goal trough 15-20 mcg/mL. -Zosyn 3.375g IV q8h (4 hour infusion). -f/u c/s, clinical progression, renal function, trough at SS  Height: 5\' 7"  (170.2 cm) Weight: 182 lb 12.2 oz (82.9 kg) IBW/kg (Calculated) : 61.6  Temp (24hrs), Avg:99.1 F (37.3 C), Min:97.7 F (36.5 C), Max:101 F (38.3 C)   Recent Labs Lab 12/27/15 12/27/15 0040 12/27/15 0400  12/28/15 0038  12/28/15 2115 12/29/15 0306 12/29/15 1034 12/29/15 1645 12/30/15 0201  WBC 8.3  --   --   < >  --   < > 13.7* 13.0* 15.9* 17.5* 15.6*  CREATININE 0.75  --  0.78  --  0.84  --   --  0.88  --   --  0.70  LATICACIDVEN  --  2.4*  --   --   --   --   --   --   --   --   --   < > = values in this interval not displayed.  Estimated Creatinine Clearance: 68.3 mL/min (by C-G formula based on Cr of 0.7).    No Known Allergies  Antimicrobials this admission: Vanc 6/20 >>  Zosyn 6/20 >>   Dose adjustments this admission: n/a  Microbiology results: 6/20 BCx: ordered 6/20 Sputum: ordered  6/16 MRSA PCR: neg  Thank you for allowing pharmacy to be a part of this patient's care.  Kasin Tonkinson D. Ila Landowski, PharmD, BCPS Clinical Pharmacist Pager: [phone] 12/30/2015 6:14 PM

## 2015-12-30 NOTE — Progress Notes (Signed)
Ice applied to hip surgical site.

## 2015-12-30 NOTE — Progress Notes (Signed)
CRITICAL VALUE ALERT  Critical value received:  Phosphorus <1  Date of notification:  12/30/2015  Time of notification:  03:50  Critical value read back:Yes.    Nurse who received alert:  Quillian Quince, RN  MD notified (1st page):  [place]  Time of first page:  03:56  MD notified (2nd page):  Time of second page:  Responding MD:  Halford Chessman  Time MD responded:  03:58

## 2015-12-30 NOTE — Progress Notes (Signed)
[place] Progress Note Patient Name: TRESE JAEGERS DOB: Sep 13, 1940 MRN: [id number]   Date of Service  12/30/2015  HPI/Events of Note  Phosphorus low.  eICU Interventions  Will give sodium phosph.     Intervention Category Major Interventions: Other:  Macsen Nuttall 12/30/2015, 3:58 AM

## 2015-12-30 NOTE — Progress Notes (Signed)
Referring Physician(s): Dr [name]  Supervising Physician: Corrie Mckusick  Patient Status:  Inpatient  Chief Complaint:  Left hip replacement 6/16 Hemorrhage post surgery Covered stent embolization of left distal iliac artery branch pseudoaneurysm/avulsion  Developed SOB-- + B PE; +Rt heart strain Thrombolysis Rt atrial thrombus Developed increasing size of Left thigh hematoma --- no new intraperitoneal/or pelvic hemorrhage Venous lysis stopped  Subjective:  Still on vent Little to no response Echo 6/19: no clot per Dr Nelda Marseille Demand ischemia- troponin elevation   Allergies: Review of patient's allergies indicates no known allergies.  Medications: Prior to Admission medications   Medication Sig Start Date End Date Taking? Authorizing Provider  CALCIUM PO Take 2 tablets by mouth daily.   Yes Historical Provider, MD  chlorhexidine (PERIDEX) 0.12 % solution 15 mLs by Mouth Rinse route 2 (two) times daily. 12/22/15  Yes Historical Provider, MD  Cholecalciferol (VITAMIN D PO) Take 1 capsule by mouth daily.   Yes Historical Provider, MD  fluticasone (FLONASE) 50 MCG/ACT nasal spray Place 2 sprays into the nose daily as needed for allergies.  11/01/15  Yes Historical Provider, MD  GINKGO BILOBA PO Take 1 capsule by mouth daily.   Yes Historical Provider, MD  Misc Natural Products (GLUCOSAMINE CHONDROITIN TRIPLE) TABS Take 2 tablets by mouth daily.   Yes Historical Provider, MD  Multiple Vitamin (MULTIVITAMIN WITH MINERALS) TABS tablet Take 1 tablet by mouth daily.   Yes Historical Provider, MD  Multiple Vitamins-Minerals (ICAPS AREDS 2) CAPS Take 1 capsule by mouth 2 (two) times daily.   Yes Historical Provider, MD  naproxen sodium (ANAPROX) 220 MG tablet Take 220 mg by mouth at bedtime as needed (pain).   Yes Historical Provider, MD  Omega-3 Fatty Acids (OMEGA 3 PO) Take 1 capsule by mouth daily.   Yes Historical Provider, MD  aspirin EC 325 MG tablet Take 1 tablet (325 mg  total) by mouth daily. 12/26/15   Geradine Girt, DO  omeprazole (PRILOSEC) 20 MG capsule Take 1 capsule (20 mg total) by mouth daily. 12/26/15   Geradine Girt, DO  ondansetron (ZOFRAN) 4 MG tablet Take 1 tablet (4 mg total) by mouth every 8 (eight) hours as needed for nausea or vomiting. 12/26/15   Geradine Girt, DO  oxyCODONE-acetaminophen (ROXICET) 5-325 MG tablet Take 1-2 tablets by mouth every 4 (four) hours as needed for severe pain. 12/26/15   Geradine Girt, DO     Vital Signs: BP 102/59 mmHg  Pulse 111  Temp(Src) 99.7 F (37.6 C) (Oral)  Resp 26  Ht 5\' 7"  (1.702 m)  Wt 182 lb 12.2 oz (82.9 kg)  BMI 28.62 kg/m2  SpO2 96%  Physical Exam  Constitutional:  Intubated; sedated  Pulmonary/Chest:  vent  Abdominal: Soft.  Skin: Skin is warm.  B pedal pulses Posterior tibials Thighs and abdomen still firm to touch Swelling noted over most of abs and legs/feet  Nursing note and vitals reviewed.   Imaging: Ct Head Wo Contrast  12/28/2015  CLINICAL DATA:  75 year old female with acute respiratory distress during venous thrombolysis. EXAM: CT HEAD WITHOUT CONTRAST TECHNIQUE: Contiguous axial images were obtained from the base of the skull through the vertex without intravenous contrast. COMPARISON:  CT 08/20/2002 FINDINGS: Note that there is venous contamination given the recent abdominal CT. No large acute intracranial hemorrhage. No midline shift or mass effect. Ventricular system unremarkable. Gray-white differentiation maintained. No significant white matter disease. Hyperdense mass along the inner table of the left  frontal bone measures 5 mm x 10 mm, compatible with a small meningioma without local mass effect. This was identified (by report) on the CT of 08/20/2002. Unremarkable appearance of the calvarium without acute fracture or aggressive lesion. Unremarkable appearance of the scalp soft tissues. Unremarkable appearance of the bilateral orbits. Mastoid air cells are clear. No  significant paranasal sinus disease IMPRESSION: No CT evidence of acute intracranial abnormality. 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 Signed, Dulcy Fanny. Earleen Newport, DO [facility] Radiology Electronically Signed   By: Corrie Mckusick D.O.   On: 12/28/2015 21:28   Ct Angio Chest Pe W Or Wo Contrast  12/27/2015  CLINICAL DATA:  Shortness of breath. Atrial mass versus blood clot seen on cardiac echo. EXAM: CT ANGIOGRAPHY CHEST WITH CONTRAST TECHNIQUE: Multidetector CT imaging of the chest was performed using the standard protocol during bolus administration of intravenous contrast. Multiplanar CT image reconstructions and MIPs were obtained to evaluate the vascular anatomy. CONTRAST:  80 cc Isovue 370 intravenously. COMPARISON:  Chest radiograph 12/26/2015 FINDINGS: Mediastinum/Lymph Nodes: Left subclavian approach central venous catheter terminates in the superior vena cava. There are bilateral pulmonary emboli. There is a nearly completely occlusive thrombus within the left lower lobar pulmonary artery, with clot extending to the basilar segmental branches. Nonocclusive clot is seen within the left upper, lingular lobar pulmonary artery, right upper, right middle and right lower lobe pulmonary arteries. There is evidence of right heart strain with right/left ventricular ratio over 1. Irregular hypoattenuated area within the right atrium may represent mixing of non-opacified blood from the inferior vena cava with contrast opacified blood, versus an intramural thrombus. No masses or pathologically enlarged lymph nodes identified. Lungs/Pleura: There are bilateral moderate in size pleural effusions. There is segmental atelectasis of the right lower lobe and subsegmental atelectasis of the left lower lobe. Upper abdomen: Water density abdominal ascites. Musculoskeletal: No chest wall mass or suspicious bone lesions identified.  Review of the MIP images confirms the above findings. IMPRESSION: Bilateral pulmonary emboli, nearly occlusive in the left lower lobe segmental branches, involving all of the segmental pulmonary arteries. Evidence of right heart strain with right/left heart ratio of over 1. Heterogeneous area of hypoattenuation within the right atrium may represent right atrial clot versus mixing of non-opacified blood from the inferior vena cava. Given presence of extensive bilateral pulmonary emboli, presence of atrial clot becomes more plausible. If clinically important, gated cardiac CT or cardiac MRI may be pursued. Bilateral pleural effusions and atelectatic changes of bilateral lung bases. Abdominal ascites. Critical Value/emergent results were called by telephone at the time of interpretation on 12/27/2015 at 7:06 pm to Dr. Halford Chessman, who verbally acknowledged these results. Electronically Signed   By: Fidela Salisbury M.D.   On: 12/27/2015 19:19   Ct Pelvis W Contrast  12/26/2015  CLINICAL DATA:  75 year old with large pelvic hematoma following left hip replacement. Evaluate for pelvic bleeding. EXAM: CT PELVIS WITH CONTRAST TECHNIQUE: Multidetector CT imaging of the pelvis was performed using the standard protocol following the bolus administration of intravenous contrast. CONTRAST:  100 mL Isovue COMPARISON:  None. FINDINGS: Vascular structures: The distal abdominal aorta and common iliac arteries are patent. There is active contrast extravasation in the left hemipelvis originating from the proximal left common femoral artery or distal left external iliac artery. Bleeding is originating near the origin of the left inferior epigastric artery but may be separate from this vessel. The left common femoral  artery and the proximal left femoral arteries are patent. The right iliac arteries and right femoral arteries are patent. The delayed images demonstrate additional contrast extravasation within the large hematoma within the  anterior pelvis and lower abdomen. There is fluid tracking into the left abdomen. There is fluid and gas tracking up the left iliacus muscle. Expected gas around the left hip from the recent hip replacement. Fluid in the pelvis. Limited evaluation of pelvic structures due to the large amount of blood. The left hip arthroplasty is located. 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 IMPRESSION: Active bleeding in the pelvis. The bleeding is originating from the proximal left common femoral artery or the distal left external iliac artery. The bleeding is near the origin of the left inferior epigastric artery. Large amount of blood and hematoma formation throughout the abdomen and pelvis. Expected postsurgical changes from left hip arthroplasty. Electronically Signed   By: Markus Daft M.D.   On: 12/26/2015 18:55   Ir Angiogram Extremity Left  12/26/2015  INDICATION: 75 year old female with a history of a hip arthroplasty and hypotension. EXAM: IR EMBO ART VEN HEMORR LYMPH EXTRAV INC GUIDE ROADMAPPING; IR ULTRASOUND GUIDANCE VASC ACCESS RIGHT; ARTERIOGRAPHY; PELVIC SELECTIVE ARTERIOGRAPHY; LEFT EXTREMITY ARTERIOGRAPHY; ADDITIONAL ARTERIOGRAPHY MEDICATIONS: None ANESTHESIA/SEDATION: Patient was intubated. Anesthesia team managed the endotracheal airway CONTRAST:  50 cc Isovue FLUOROSCOPY TIME:  Fluoroscopy Time: 5 minutes 42 seconds COMPLICATIONS: None PROCEDURE: Informed consent was obtained from the patient following explanation of the procedure, risks, benefits and alternatives. The patient understands, agrees and consents for the procedure. All questions were addressed. A time out was performed prior to the initiation of the procedure. Maximal barrier sterile technique utilized including caps, mask, sterile gowns, sterile gloves, large sterile drape, hand hygiene, and Betadine prep. Patient positioned supine position on the fluoroscopy table. The right inguinal region was prepped and draped in the usual  sterile fashion. Ultrasound survey of the right inguinal region was performed with images stored and sent to PACs. A micropuncture needle was used access the right common femoral artery under ultrasound. With excellent arterial blood flow returned, and an .018 micro wire was passed through the needle, observed enter the abdominal aorta under fluoroscopy. The needle was removed, and a micropuncture sheath was placed over the wire. The inner dilator and wire were removed, and an 035 Bentson wire was advanced under fluoroscopy into the abdominal aorta. The sheath was removed and a standard 5 [place] vascular sheath was placed. The dilator was removed and the sheath was flushed. Omni Flush catheter was used to navigate a Bentson wire over the aortic bifurcation into the left iliac system. Catheter was exchanged for a C2 catheter advanced into the hypogastric artery with an angiogram performed. Catheter was withdrawn and redirected into the external iliac artery. Angiogram was performed. A Rosen wire was advanced into the proximal femoral system and the catheter was withdrawn. Six French right tip sheath was advanced over the bifurcation into the left iliac system. 7 mm x 4 cm balloon was advanced over the Rosen wire into the proximal left external iliac artery. Balloon was gently inflated to low atmospheric pressure, with inflation of approximately 4 minutes. At this time, measurements of the left external iliac artery were performed. Balloon was deflated, the balloon catheter was advanced into the proximal left femoral system, and the 035 Rosen wire was exchanged for an 018 S-V8 wire. Balloon catheter was removed. A 69mm x 56mm Viabahn stent graft was select thinned and advanced over the 018  wire to the targeted the avulsed artery. Stent graft was deployed. Balloon angioplasty to the nominal diameter of 6 mm was performed. Final angiogram was performed. The 6 French 35 cm right tip sheath was exchanged for a standard 10  cm 6 French sheath. Sheath was elected to remain given the patient's coagulopathy. Patient tolerated the procedure well with no significant blood loss. IMPRESSION: Status post left lower extremity angiogram with emergent covered stent embolization of distal external iliac artery branch vessel pseudoaneurysm/avulsion, with placement of stent graft. Signed, Dulcy Fanny. Earleen Newport, DO [facility] Radiology Electronically Signed   By: Corrie Mckusick D.O.   On: 12/26/2015 22:45   Ir Angiogram Pelvis Selective Or Supraselective  12/26/2015  INDICATION: 75 year old female with a history of a hip arthroplasty and hypotension. EXAM: IR EMBO ART VEN HEMORR LYMPH EXTRAV INC GUIDE ROADMAPPING; IR ULTRASOUND GUIDANCE VASC ACCESS RIGHT; ARTERIOGRAPHY; PELVIC SELECTIVE ARTERIOGRAPHY; LEFT EXTREMITY ARTERIOGRAPHY; ADDITIONAL ARTERIOGRAPHY MEDICATIONS: None ANESTHESIA/SEDATION: Patient was intubated. Anesthesia team managed the endotracheal airway CONTRAST:  50 cc Isovue FLUOROSCOPY TIME:  Fluoroscopy Time: 5 minutes 42 seconds COMPLICATIONS: None PROCEDURE: Informed consent was obtained from the patient following explanation of the procedure, risks, benefits and alternatives. The patient understands, agrees and consents for the procedure. All questions were addressed. A time out was performed prior to the initiation of the procedure. Maximal barrier sterile technique utilized including caps, mask, sterile gowns, sterile gloves, large sterile drape, hand hygiene, and Betadine prep. Patient positioned supine position on the fluoroscopy table. The right inguinal region was prepped and draped in the usual sterile fashion. Ultrasound survey of the right inguinal region was performed with images stored and sent to PACs. A micropuncture needle was used access the right common femoral artery under ultrasound. With excellent arterial blood flow returned, and an .018 micro wire was passed through  the needle, observed enter the abdominal aorta under fluoroscopy. The needle was removed, and a micropuncture sheath was placed over the wire. The inner dilator and wire were removed, and an 035 Bentson wire was advanced under fluoroscopy into the abdominal aorta. The sheath was removed and a standard 5 [place] vascular sheath was placed. The dilator was removed and the sheath was flushed. Omni Flush catheter was used to navigate a Bentson wire over the aortic bifurcation into the left iliac system. Catheter was exchanged for a C2 catheter advanced into the hypogastric artery with an angiogram performed. Catheter was withdrawn and redirected into the external iliac artery. Angiogram was performed. A Rosen wire was advanced into the proximal femoral system and the catheter was withdrawn. Six French right tip sheath was advanced over the bifurcation into the left iliac system. 7 mm x 4 cm balloon was advanced over the Rosen wire into the proximal left external iliac artery. Balloon was gently inflated to low atmospheric pressure, with inflation of approximately 4 minutes. At this time, measurements of the left external iliac artery were performed. Balloon was deflated, the balloon catheter was advanced into the proximal left femoral system, and the 035 Rosen wire was exchanged for an 018 S-V8 wire. Balloon catheter was removed. A 32mm x 39mm Viabahn stent graft was select thinned and advanced over the 018 wire to the targeted the avulsed artery. Stent graft was deployed. Balloon angioplasty to the nominal diameter of 6 mm was performed. Final angiogram was performed. The 6 French 35 cm right tip sheath was exchanged for a standard 10 cm 6 French sheath. Sheath was elected to remain  given the patient's coagulopathy. Patient tolerated the procedure well with no significant blood loss. IMPRESSION: Status post left lower extremity angiogram with emergent covered stent embolization of distal external iliac artery branch  vessel pseudoaneurysm/avulsion, with placement of stent graft. Signed, Dulcy Fanny. Earleen Newport, DO [facility] Radiology Electronically Signed   By: Corrie Mckusick D.O.   On: 12/26/2015 22:45   Ir Angiogram Selective Each Additional Vessel  12/26/2015  INDICATION: 75 year old female with a history of a hip arthroplasty and hypotension. EXAM: IR EMBO ART VEN HEMORR LYMPH EXTRAV INC GUIDE ROADMAPPING; IR ULTRASOUND GUIDANCE VASC ACCESS RIGHT; ARTERIOGRAPHY; PELVIC SELECTIVE ARTERIOGRAPHY; LEFT EXTREMITY ARTERIOGRAPHY; ADDITIONAL ARTERIOGRAPHY MEDICATIONS: None ANESTHESIA/SEDATION: Patient was intubated. Anesthesia team managed the endotracheal airway CONTRAST:  50 cc Isovue FLUOROSCOPY TIME:  Fluoroscopy Time: 5 minutes 42 seconds COMPLICATIONS: None PROCEDURE: Informed consent was obtained from the patient following explanation of the procedure, risks, benefits and alternatives. The patient understands, agrees and consents for the procedure. All questions were addressed. A time out was performed prior to the initiation of the procedure. Maximal barrier sterile technique utilized including caps, mask, sterile gowns, sterile gloves, large sterile drape, hand hygiene, and Betadine prep. Patient positioned supine position on the fluoroscopy table. The right inguinal region was prepped and draped in the usual sterile fashion. Ultrasound survey of the right inguinal region was performed with images stored and sent to PACs. A micropuncture needle was used access the right common femoral artery under ultrasound. With excellent arterial blood flow returned, and an .018 micro wire was passed through the needle, observed enter the abdominal aorta under fluoroscopy. The needle was removed, and a micropuncture sheath was placed over the wire. The inner dilator and wire were removed, and an 035 Bentson wire was advanced under fluoroscopy into the abdominal aorta. The sheath was removed  and a standard 5 [place] vascular sheath was placed. The dilator was removed and the sheath was flushed. Omni Flush catheter was used to navigate a Bentson wire over the aortic bifurcation into the left iliac system. Catheter was exchanged for a C2 catheter advanced into the hypogastric artery with an angiogram performed. Catheter was withdrawn and redirected into the external iliac artery. Angiogram was performed. A Rosen wire was advanced into the proximal femoral system and the catheter was withdrawn. Six French right tip sheath was advanced over the bifurcation into the left iliac system. 7 mm x 4 cm balloon was advanced over the Rosen wire into the proximal left external iliac artery. Balloon was gently inflated to low atmospheric pressure, with inflation of approximately 4 minutes. At this time, measurements of the left external iliac artery were performed. Balloon was deflated, the balloon catheter was advanced into the proximal left femoral system, and the 035 Rosen wire was exchanged for an 018 S-V8 wire. Balloon catheter was removed. A 17mm x 39mm Viabahn stent graft was select thinned and advanced over the 018 wire to the targeted the avulsed artery. Stent graft was deployed. Balloon angioplasty to the nominal diameter of 6 mm was performed. Final angiogram was performed. The 6 French 35 cm right tip sheath was exchanged for a standard 10 cm 6 French sheath. Sheath was elected to remain given the patient's coagulopathy. Patient tolerated the procedure well with no significant blood loss. IMPRESSION: Status post left lower extremity angiogram with emergent covered stent embolization of distal external iliac artery branch vessel pseudoaneurysm/avulsion, with placement of stent graft. Signed, Dulcy Fanny. Earleen Newport DO [facility]  [facility] Radiology Electronically Signed   By: Corrie Mckusick D.O.   On: 12/26/2015 22:45   Ir Mariana Arn Ivc  12/28/2015  INDICATION:  75 year old female, status post postoperative hemorrhage left external iliac artery branch vessel, with embolization for hemostasis. Subsequent development of respiratory decompensation with cardiac echo demonstrating giant atrial thrombus. Surgical embolectomy carries a high morbidity/ mortality given the patient's current ICU status. Alternative treatment options include catheter embolectomy/thrombectomy, as well as catheter directed thrombolysis. Family has elected to proceed with least invasive therapy. EXAM: IR INFUSION THROMBOL ARTERIAL INITIAL (MS); IR ULTRASOUND GUIDANCE VASC ACCESS RIGHT; INFERIOR VENA CAVOGRAM COMPARISON:  CT pelvis 12/26/2015 CT chest 12/27/2015 Cardiac ECHO 12/27/2015 and 12/28/2015 MEDICATIONS: None. ANESTHESIA/SEDATION: Versed 0.5 mg IV; Fentanyl 0 mcg IV No moderate sedation The patient was continuously monitored during the procedure by the interventional radiology nurse under my direct supervision. FLUOROSCOPY TIME:  Fluoroscopy Time: 0 minutes 36 seconds (58 mGy). COMPLICATIONS: None TECHNIQUE: Informed written consent was obtained from the patient and the patient's husband after a thorough discussion of the procedural risks, benefits and alternatives. All questions were addressed. Maximal Sterile Barrier Technique was utilized including caps, mask, sterile gowns, sterile gloves, sterile drape, hand hygiene and skin antiseptic. A timeout was performed prior to the initiation of the procedure. Patient positioned supine position on the fluoroscopy table. The right inguinal region was prepped and draped in the usual sterile fashion. Ultrasound survey of the right inguinal region was performed with images stored and sent to PACs. A single wall 19 gauge needle was used access the right common femoral vein under ultrasound. With excellent venous blood flow returned, an 035 Bentson wire was passed through the needle, observed enter the abdominal IVC under fluoroscopy. The needle was  removed, and a standard 5 [place] vascular sheath was placed. The dilator was removed and the sheath was flushed. Limited venogram was then performed of the right iliac vein and the IVC. A stiff Glidewire was then advanced to the inferior cavoatrial junction. A 20 cm infusion length Uni fuse catheter was then placed at the suprarenal IVC, with the tip terminating in the inferior right atrium. Final image was stored. Patient tolerated the procedure well and remained hemodynamically unchanged. No blood loss. FINDINGS: Ultrasound survey demonstrates patent right common femoral vein. Limited venogram of the right iliac vein and IVC demonstrates no thrombus of the visualized right iliac system. Venous reflux into the left iliac system with slow flow through the IVC. No occlusive thrombus of the infrarenal or suprarenal IVC. There is poor definition of the known right atrial thrombus given the low volume injection. IMPRESSION: Status post right common femoral vein access for placement of thrombolysis catheter and initiation of pharmacologic thrombolysis to treat known right atrial thrombus. Signed, Dulcy Fanny. Earleen Newport, DO [facility] Radiology Electronically Signed   By: Corrie Mckusick D.O.   On: 12/28/2015 16:56   Ir Angiogram Follow Up Study  12/26/2015  INDICATION: 75 year old female with a history of a hip arthroplasty and hypotension. EXAM: IR EMBO ART VEN HEMORR LYMPH EXTRAV INC GUIDE ROADMAPPING; IR ULTRASOUND GUIDANCE VASC ACCESS RIGHT; ARTERIOGRAPHY; PELVIC SELECTIVE ARTERIOGRAPHY; LEFT EXTREMITY ARTERIOGRAPHY; ADDITIONAL ARTERIOGRAPHY MEDICATIONS: None ANESTHESIA/SEDATION: Patient was intubated. Anesthesia team managed the endotracheal airway CONTRAST:  50 cc Isovue FLUOROSCOPY TIME:  Fluoroscopy Time: 5 minutes 42 seconds COMPLICATIONS: None PROCEDURE: Informed consent was obtained from the patient following explanation of the procedure, risks, benefits and  alternatives. The patient understands, agrees and consents for the procedure. All  questions were addressed. A time out was performed prior to the initiation of the procedure. Maximal barrier sterile technique utilized including caps, mask, sterile gowns, sterile gloves, large sterile drape, hand hygiene, and Betadine prep. Patient positioned supine position on the fluoroscopy table. The right inguinal region was prepped and draped in the usual sterile fashion. Ultrasound survey of the right inguinal region was performed with images stored and sent to PACs. A micropuncture needle was used access the right common femoral artery under ultrasound. With excellent arterial blood flow returned, and an .018 micro wire was passed through the needle, observed enter the abdominal aorta under fluoroscopy. The needle was removed, and a micropuncture sheath was placed over the wire. The inner dilator and wire were removed, and an 035 Bentson wire was advanced under fluoroscopy into the abdominal aorta. The sheath was removed and a standard 5 [place] vascular sheath was placed. The dilator was removed and the sheath was flushed. Omni Flush catheter was used to navigate a Bentson wire over the aortic bifurcation into the left iliac system. Catheter was exchanged for a C2 catheter advanced into the hypogastric artery with an angiogram performed. Catheter was withdrawn and redirected into the external iliac artery. Angiogram was performed. A Rosen wire was advanced into the proximal femoral system and the catheter was withdrawn. Six French right tip sheath was advanced over the bifurcation into the left iliac system. 7 mm x 4 cm balloon was advanced over the Rosen wire into the proximal left external iliac artery. Balloon was gently inflated to low atmospheric pressure, with inflation of approximately 4 minutes. At this time, measurements of the left external iliac artery were performed. Balloon was deflated, the balloon catheter was  advanced into the proximal left femoral system, and the 035 Rosen wire was exchanged for an 018 S-V8 wire. Balloon catheter was removed. A 73mm x 3mm Viabahn stent graft was select thinned and advanced over the 018 wire to the targeted the avulsed artery. Stent graft was deployed. Balloon angioplasty to the nominal diameter of 6 mm was performed. Final angiogram was performed. The 6 French 35 cm right tip sheath was exchanged for a standard 10 cm 6 French sheath. Sheath was elected to remain given the patient's coagulopathy. Patient tolerated the procedure well with no significant blood loss. IMPRESSION: Status post left lower extremity angiogram with emergent covered stent embolization of distal external iliac artery branch vessel pseudoaneurysm/avulsion, with placement of stent graft. Signed, Dulcy Fanny. Earleen Newport, DO [facility] Radiology Electronically Signed   By: Corrie Mckusick D.O.   On: 12/26/2015 22:45   Ir US Guide Vasc Access Right  12/28/2015  INDICATION: 75 year old female, status post postoperative hemorrhage left external iliac artery branch vessel, with embolization for hemostasis. Subsequent development of respiratory decompensation with cardiac echo demonstrating giant atrial thrombus. Surgical embolectomy carries a high morbidity/ mortality given the patient's current ICU status. Alternative treatment options include catheter embolectomy/thrombectomy, as well as catheter directed thrombolysis. Family has elected to proceed with least invasive therapy. EXAM: IR INFUSION THROMBOL ARTERIAL INITIAL (MS); IR ULTRASOUND GUIDANCE VASC ACCESS RIGHT; INFERIOR VENA CAVOGRAM COMPARISON:  CT pelvis 12/26/2015 CT chest 12/27/2015 Cardiac ECHO 12/27/2015 and 12/28/2015 MEDICATIONS: None. ANESTHESIA/SEDATION: Versed 0.5 mg IV; Fentanyl 0 mcg IV No moderate sedation The patient was continuously monitored during the procedure by the interventional radiology nurse under  my direct supervision. FLUOROSCOPY TIME:  Fluoroscopy Time: 0 minutes 36 seconds (58 mGy). COMPLICATIONS: None TECHNIQUE: Informed written consent was obtained  from the patient and the patient's husband after a thorough discussion of the procedural risks, benefits and alternatives. All questions were addressed. Maximal Sterile Barrier Technique was utilized including caps, mask, sterile gowns, sterile gloves, sterile drape, hand hygiene and skin antiseptic. A timeout was performed prior to the initiation of the procedure. Patient positioned supine position on the fluoroscopy table. The right inguinal region was prepped and draped in the usual sterile fashion. Ultrasound survey of the right inguinal region was performed with images stored and sent to PACs. A single wall 19 gauge needle was used access the right common femoral vein under ultrasound. With excellent venous blood flow returned, an 035 Bentson wire was passed through the needle, observed enter the abdominal IVC under fluoroscopy. The needle was removed, and a standard 5 [place] vascular sheath was placed. The dilator was removed and the sheath was flushed. Limited venogram was then performed of the right iliac vein and the IVC. A stiff Glidewire was then advanced to the inferior cavoatrial junction. A 20 cm infusion length Uni fuse catheter was then placed at the suprarenal IVC, with the tip terminating in the inferior right atrium. Final image was stored. Patient tolerated the procedure well and remained hemodynamically unchanged. No blood loss. FINDINGS: Ultrasound survey demonstrates patent right common femoral vein. Limited venogram of the right iliac vein and IVC demonstrates no thrombus of the visualized right iliac system. Venous reflux into the left iliac system with slow flow through the IVC. No occlusive thrombus of the infrarenal or suprarenal IVC. There is poor definition of the known right atrial thrombus given the low volume injection.  IMPRESSION: Status post right common femoral vein access for placement of thrombolysis catheter and initiation of pharmacologic thrombolysis to treat known right atrial thrombus. Signed, Dulcy Fanny. Earleen Newport, DO [facility] Radiology Electronically Signed   By: Corrie Mckusick D.O.   On: 12/28/2015 16:56   Ir US Guide Vasc Access Right  12/26/2015  INDICATION: 75 year old female with a history of a hip arthroplasty and hypotension. EXAM: IR EMBO ART VEN HEMORR LYMPH EXTRAV INC GUIDE ROADMAPPING; IR ULTRASOUND GUIDANCE VASC ACCESS RIGHT; ARTERIOGRAPHY; PELVIC SELECTIVE ARTERIOGRAPHY; LEFT EXTREMITY ARTERIOGRAPHY; ADDITIONAL ARTERIOGRAPHY MEDICATIONS: None ANESTHESIA/SEDATION: Patient was intubated. Anesthesia team managed the endotracheal airway CONTRAST:  50 cc Isovue FLUOROSCOPY TIME:  Fluoroscopy Time: 5 minutes 42 seconds COMPLICATIONS: None PROCEDURE: Informed consent was obtained from the patient following explanation of the procedure, risks, benefits and alternatives. The patient understands, agrees and consents for the procedure. All questions were addressed. A time out was performed prior to the initiation of the procedure. Maximal barrier sterile technique utilized including caps, mask, sterile gowns, sterile gloves, large sterile drape, hand hygiene, and Betadine prep. Patient positioned supine position on the fluoroscopy table. The right inguinal region was prepped and draped in the usual sterile fashion. Ultrasound survey of the right inguinal region was performed with images stored and sent to PACs. A micropuncture needle was used access the right common femoral artery under ultrasound. With excellent arterial blood flow returned, and an .018 micro wire was passed through the needle, observed enter the abdominal aorta under fluoroscopy. The needle was removed, and a micropuncture sheath was placed over the wire. The inner dilator and wire were removed, and an  035 Bentson wire was advanced under fluoroscopy into the abdominal aorta. The sheath was removed and a standard 5 [place] vascular sheath was placed. The dilator was removed and the sheath was flushed. Omni Flush catheter  was used to navigate a Bentson wire over the aortic bifurcation into the left iliac system. Catheter was exchanged for a C2 catheter advanced into the hypogastric artery with an angiogram performed. Catheter was withdrawn and redirected into the external iliac artery. Angiogram was performed. A Rosen wire was advanced into the proximal femoral system and the catheter was withdrawn. Six French right tip sheath was advanced over the bifurcation into the left iliac system. 7 mm x 4 cm balloon was advanced over the Rosen wire into the proximal left external iliac artery. Balloon was gently inflated to low atmospheric pressure, with inflation of approximately 4 minutes. At this time, measurements of the left external iliac artery were performed. Balloon was deflated, the balloon catheter was advanced into the proximal left femoral system, and the 035 Rosen wire was exchanged for an 018 S-V8 wire. Balloon catheter was removed. A 2mm x 54mm Viabahn stent graft was select thinned and advanced over the 018 wire to the targeted the avulsed artery. Stent graft was deployed. Balloon angioplasty to the nominal diameter of 6 mm was performed. Final angiogram was performed. The 6 French 35 cm right tip sheath was exchanged for a standard 10 cm 6 French sheath. Sheath was elected to remain given the patient's coagulopathy. Patient tolerated the procedure well with no significant blood loss. IMPRESSION: Status post left lower extremity angiogram with emergent covered stent embolization of distal external iliac artery branch vessel pseudoaneurysm/avulsion, with placement of stent graft. Signed, Dulcy Fanny. Earleen Newport, DO [facility] Radiology Electronically Signed   By: Corrie Mckusick D.O.   On: 12/26/2015 22:45   Dg Chest Port 1 View  12/30/2015  CLINICAL DATA:  Shortness of breath. EXAM: PORTABLE CHEST 1 VIEW COMPARISON:  12/29/2015. FINDINGS: Endotracheal tube, NG tube, left subclavian line stable position. Cardiomegaly with diffuse bilateral pulmonary infiltrates pleural effusions consistent with congestive heart failure. No pneumothorax. IMPRESSION: 1. Lines and tubes stable position. 2. Cardiomegaly with diffuse bilateral pulmonary infiltrates and bilateral pleural effusions consistent with congestive heart failure. Electronically Signed   By: Marcello Moores  Register   On: 12/30/2015 06:48   Dg Chest Port 1 View  12/29/2015  CLINICAL DATA:  75 year old female with a history of left hip replacement, postoperative hemorrhage and subsequent embolization, and development of pulmonary emboli. 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 EXAM: PORTABLE CHEST 1 VIEW COMPARISON:  Multiple prior FINDINGS: Cardiomediastinal silhouette unchanged in size and contour. Similar position of defibrillator pads on the chest wall. Unchanged position of the endotracheal tube, which terminates suitably above the carina approximately 3.4 cm. Gastric tube unchanged projecting over the mediastinum and terminating and out of the field of view. Unchanged position of left subclavian central catheter which terminates in the superior vena cava. Opacities at the bilateral lung bases partially obscuring the hemidiaphragms and the heart borders. Veiled opacity in the lower lungs. Mixed interstitial and airspace opacities in the mid and lower lobes. No pneumothorax. No displaced rib fracture. IMPRESSION: Opacities the bilateral lung bases, likely a combination of pleural effusion and atelectasis/ consolidation. Unchanged position of support apparatus, including defibrillator pads, gastric tube, left subclavian central venous catheter, and endotracheal tube which  terminates suitably above the carina. Signed, Dulcy Fanny. Earleen Newport, DO [facility] Radiology Electronically Signed   By: Corrie Mckusick D.O.   On: 12/29/2015 07:05   Dg Chest Port 1 View  12/28/2015  CLINICAL DATA:  Evaluate endotracheal and orogastric  tube placement. Acute respiratory failure. EXAM: PORTABLE CHEST 1 VIEW COMPARISON:  Chest radiograph December 28, 2015 at 0916 hours FINDINGS: Endotracheal tube tip projects 4.8 cm above the carina. Nasogastric tube past proximal stomach, not imaged. Nasogastric tube side port projects in proximal stomach. Stable appearance of LEFT subclavian central venous catheter with distal tip projecting in mid superior vena cava. Similar moderate RIGHT pleural effusion. Bibasilar strandy densities. Biapical pleural capping. No pneumothorax. Soft tissue planes and included osseous structures are nonsuspicious. IMPRESSION: Endotracheal tube tip projects 4.8 cm above the carina. Nasogastric tube past proximal stomach. No change in LEFT subclavian central venous catheter. Moderate RIGHT pleural effusion with bibasilar atelectasis. Electronically Signed   By: Elon Alas M.D.   On: 12/28/2015 19:38   Dg Chest Port 1 View  12/28/2015  CLINICAL DATA:  Hypoxia.  Recent diagnosis of pulmonary embolism. EXAM: PORTABLE CHEST 1 VIEW COMPARISON:  Chest CT 12/27/2015. FINDINGS: Central venous catheter tip projects over the superior vena cava. Monitoring leads project over the patient. Stable cardiac and mediastinal contours. Moderate layering bilateral pleural effusions and underlying opacities within the mid lower lungs bilaterally. No pneumothorax. IMPRESSION: Moderate layering bilateral pleural effusions with underlying opacities which may represent atelectasis. Electronically Signed   By: Lovey Newcomer M.D.   On: 12/28/2015 10:53   Dg Chest Port 1 View  12/26/2015  CLINICAL DATA:  Patient status post ET tube placement. EXAM: PORTABLE  CHEST 1 VIEW COMPARISON:  Chest radiograph 12/25/2015. FINDINGS: ET tube terminates in the mid trachea. Left subclavian central venous catheter tip projects over the superior vena cava. Enteric tube tip and side port terminates in the midesophagus, recommend advancement. Stable cardiac and mediastinal contours. Low lung volumes. Elevation of the right hemidiaphragm. Right-greater-than-left interstitial pulmonary opacities. No large pleural effusion or pneumothorax. IMPRESSION: ET tube tip and side port terminate in the midesophagus, recommend advancement. Bilateral, left-greater-than-right, interstitial opacities which may represent pulmonary edema. These results will be called to the ordering clinician or representative by the Radiologist Assistant, and communication documented in the PACS or zVision Dashboard. Electronically Signed   By: Lovey Newcomer M.D.   On: 12/26/2015 21:07   Dg Hip Operative Unilat W Or W/o Pelvis Left  12/26/2015  CLINICAL DATA:  Left anterior hip replacement due to fracture. EXAM: OPERATIVE LEFT HIP (WITH PELVIS IF PERFORMED) 1 VIEW TECHNIQUE: Fluoroscopic spot image(s) were submitted for interpretation post-operatively. COMPARISON:  12/25/2015 FINDINGS: A total left hip arthroplasty has been performed. Alignment of the prosthesis is grossly normal on this single view. No evidence for a periprosthetic fracture. IMPRESSION: Left hip replacement without complicating features. Electronically Signed   By: Markus Daft M.D.   On: 12/26/2015 16:34   [place] Guide Roadmapping  12/26/2015  INDICATION: 75 year old female with a history of a hip arthroplasty and hypotension. EXAM: IR EMBO ART VEN HEMORR LYMPH EXTRAV INC GUIDE ROADMAPPING; IR ULTRASOUND GUIDANCE VASC ACCESS RIGHT; ARTERIOGRAPHY; PELVIC SELECTIVE ARTERIOGRAPHY; LEFT EXTREMITY ARTERIOGRAPHY; ADDITIONAL ARTERIOGRAPHY MEDICATIONS: None ANESTHESIA/SEDATION: Patient was intubated. Anesthesia team managed  the endotracheal airway CONTRAST:  50 cc Isovue FLUOROSCOPY TIME:  Fluoroscopy Time: 5 minutes 42 seconds COMPLICATIONS: None PROCEDURE: Informed consent was obtained from the patient following explanation of the procedure, risks, benefits and alternatives. The patient understands, agrees and consents for the procedure. All questions were addressed. A time out was performed prior to the initiation of the procedure. Maximal barrier sterile technique utilized including caps, mask, sterile gowns, sterile gloves, large  sterile drape, hand hygiene, and Betadine prep. Patient positioned supine position on the fluoroscopy table. The right inguinal region was prepped and draped in the usual sterile fashion. Ultrasound survey of the right inguinal region was performed with images stored and sent to PACs. A micropuncture needle was used access the right common femoral artery under ultrasound. With excellent arterial blood flow returned, and an .018 micro wire was passed through the needle, observed enter the abdominal aorta under fluoroscopy. The needle was removed, and a micropuncture sheath was placed over the wire. The inner dilator and wire were removed, and an 035 Bentson wire was advanced under fluoroscopy into the abdominal aorta. The sheath was removed and a standard 5 [place] vascular sheath was placed. The dilator was removed and the sheath was flushed. Omni Flush catheter was used to navigate a Bentson wire over the aortic bifurcation into the left iliac system. Catheter was exchanged for a C2 catheter advanced into the hypogastric artery with an angiogram performed. Catheter was withdrawn and redirected into the external iliac artery. Angiogram was performed. A Rosen wire was advanced into the proximal femoral system and the catheter was withdrawn. Six French right tip sheath was advanced over the bifurcation into the left iliac system. 7 mm x 4 cm balloon was advanced over the Rosen wire into the proximal left  external iliac artery. Balloon was gently inflated to low atmospheric pressure, with inflation of approximately 4 minutes. At this time, measurements of the left external iliac artery were performed. Balloon was deflated, the balloon catheter was advanced into the proximal left femoral system, and the 035 Rosen wire was exchanged for an 018 S-V8 wire. Balloon catheter was removed. A 24mm x 83mm Viabahn stent graft was select thinned and advanced over the 018 wire to the targeted the avulsed artery. Stent graft was deployed. Balloon angioplasty to the nominal diameter of 6 mm was performed. Final angiogram was performed. The 6 French 35 cm right tip sheath was exchanged for a standard 10 cm 6 French sheath. Sheath was elected to remain given the patient's coagulopathy. Patient tolerated the procedure well with no significant blood loss. IMPRESSION: Status post left lower extremity angiogram with emergent covered stent embolization of distal external iliac artery branch vessel pseudoaneurysm/avulsion, with placement of stent graft. Signed, Dulcy Fanny. Earleen Newport, DO [facility] Radiology Electronically Signed   By: Corrie Mckusick D.O.   On: 12/26/2015 22:45   Ct Angio Abd/pel W/ And/or W/o  12/28/2015  CLINICAL DATA:  75 year old female with left hip arthroplasty and postoperative hemorrhage. Discovery of rate atrial thrombus, with respiratory compromise. Status post initiation of catheter directed thrombolysis of atrial thrombus via right common femoral vein approach. EXAM: CT ANGIOGRAPHY ABDOMEN AND PELVIS TECHNIQUE: Multidetector CT imaging through the abdomen and pelvis was performed using the standard protocol during bolus administration of intravenous contrast. Multiplanar reconstructed images including MIPs were obtained and reviewed to evaluate the vascular anatomy. CONTRAST:  100 cc Isovue 370 COMPARISON:  CT chest 12/27/2015, CT pelvis 12/26/2015 FINDINGS: LOWER  CHEST: Nonvascular: Unremarkable appearance of the superficial soft tissues the chest. Heart size within normal limits. Tip of left subclavian central venous catheter partially visualized terminating in the superior vena cava. Tip of the lower extremity lytic catheter terminates in the right atrium. Questionable hypoperfusion of the myocardium. Small bilateral pleural effusions with associated atelectasis. Gastric tube continues through the esophagus terminating in the stomach. Aeration of the bilateral lower lobes improved compared to the prior CT,  status post intubation with positive pressure ventilation. Endotracheal tube terminates above the carina. Vascular: Normal course caliber and contour of the thoracic aorta. Minimal atherosclerotic changes. No dissection or aneurysm. No ulceration. Timing is not optimized for the evaluation of the pulmonary arteries, however, there is no central, lobar filling defects. There are a few visualized segmental/subsegmental filling defects in the distribution of the comparison CT, with improved thrombus burden of the bilateral lower lobes. There is no large filling defect identified within the right atrium on the CT. ABDOMEN PELVIS: Nonvascular: Intermediate density fluid layered in the bilateral subdiaphragmatic space tracking along the bilateral pericolic gutter. Mixed density fluid in the pelvis, predominantly extraperitoneal (within the space of Retzius. Overall volume appears decreased from the comparison CT of the pelvis performed 12/26/2015 largest component on today's study measures approximately 16.3 cm by 11 cm, compared to 15.5 cm x 15 cm. Unremarkable appearance of liver, spleen. Unremarkable appearance of the pancreas. Unremarkable appearance of the bilateral adrenal glands. No evidence of hydronephrosis. Unremarkable course of the bilateral ureters, which are partially opacified with contrast. High density material within the gallbladder, compatible with vicarious  excretion of contrast. Urinary bladder decompressed with catheter in position. Vascular: Normal course caliber and contour of the abdominal aorta. Scattered calcifications present. No aneurysm or dissection flap. No periaortic fluid. Celiac artery and superior mesenteric artery are patent. Bilateral renal arteries patent with no significant stenosis. Inferior mesenteric artery patent. Right lower extremity: Normal course caliber and contour of the right external iliac artery without aneurysm or dissection flap. No active extravasation or evidence of arterial injury. Right hypogastric arteries patent including anterior and posterior branches. Proximal right femoral arteries patent. Left lower extremity: Normal course caliber and contour of the left common iliac artery. No aneurysm or dissection. No occlusion. Hypogastric arteries patent including anterior posterior branches. No evidence of active extravasation or contrast pooling involving the hypogastric territory. External iliac artery is patent. Vascular stent in place at the site of previously identified injury. Stent terminates at the level of the inguinal ligament. Proximal left femoral arteries unremarkable with no active extravasation. There is enlarging hematoma of the proximal left thigh compared to the prior pelvis CT. Hematoma measures approximately 10.5 cm in greatest diameter, increased from 9.3 cm on the prior. Hematoma centered within the anterior and lateral rectus musculature. There is a tiny linear hyperdensity centered within the hematoma on the lateral aspect, potentially site of hemorrhage. No significant pooling on the post contrast images. Unremarkable appearance of the venous system. Venous catheter present within the right femoral system, traversing the IVC. The left external iliac and common iliac vein are compressed by a pelvic hematoma. Musculoskeletal: Surgical changes of left hip arthroplasty. Mild degenerative changes of the spine. No  displaced fracture is identified. Review of the MIP images confirms the above findings. IMPRESSION: No evidence of new intraperitoneal or pelvic hemorrhage to account for new back pain. Re- demonstration of extraperitoneal and intraperitoneal hemorrhage, with decreasing size of the largest component in the space of Retzius and within the extraperitoneal pelvis. There is increasing hematoma in the left proximal thigh, with dense focus centered within the musculature which may indicate venous bleeding or small arterial bleeding. This detail was discussed with the managing physicians at the time of the CT scan. Patent left external iliac stent, status post embolization. Questionable hypodensity of the left ventricle myocardium, potentially indicating hypoperfusion. Compared to the prior CT of the chest, there is decreasing appreciable volume of pulmonary embolus in the bilateral  lower lobes. No large right atrial filling defect identified on the delayed images. Small bilateral pleural effusions with associated atelectasis. Signed, Dulcy Fanny. Earleen Newport, DO [facility] Electronically Signed   By: Corrie Mckusick D.O.   On: 12/28/2015 21:10   Ir Infusion Thrombol Venous Initial (ms)  12/28/2015  INDICATION: 74 year old female, status post postoperative hemorrhage left external iliac artery branch vessel, with embolization for hemostasis. Subsequent development of respiratory decompensation with cardiac echo demonstrating giant atrial thrombus. Surgical embolectomy carries a high morbidity/ mortality given the patient's current ICU status. Alternative treatment options include catheter embolectomy/thrombectomy, as well as catheter directed thrombolysis. Family has elected to proceed with least invasive therapy. EXAM: IR INFUSION THROMBOL ARTERIAL INITIAL (MS); IR ULTRASOUND GUIDANCE VASC ACCESS RIGHT; INFERIOR VENA CAVOGRAM COMPARISON:  CT pelvis 12/26/2015 CT chest  12/27/2015 Cardiac ECHO 12/27/2015 and 12/28/2015 MEDICATIONS: None. ANESTHESIA/SEDATION: Versed 0.5 mg IV; Fentanyl 0 mcg IV No moderate sedation The patient was continuously monitored during the procedure by the interventional radiology nurse under my direct supervision. FLUOROSCOPY TIME:  Fluoroscopy Time: 0 minutes 36 seconds (58 mGy). COMPLICATIONS: None TECHNIQUE: Informed written consent was obtained from the patient and the patient's husband after a thorough discussion of the procedural risks, benefits and alternatives. All questions were addressed. Maximal Sterile Barrier Technique was utilized including caps, mask, sterile gowns, sterile gloves, sterile drape, hand hygiene and skin antiseptic. A timeout was performed prior to the initiation of the procedure. Patient positioned supine position on the fluoroscopy table. The right inguinal region was prepped and draped in the usual sterile fashion. Ultrasound survey of the right inguinal region was performed with images stored and sent to PACs. A single wall 19 gauge needle was used access the right common femoral vein under ultrasound. With excellent venous blood flow returned, an 035 B[place]on wire was passed through the needle, observed enter the abdominal IVC under fluoroscopy. The needle was removed, and a standard 5 [place] vascular sheath was placed. The dilator was removed and the sheath was flushed. Limited venogram was then performed of the right iliac vein and the IVC. A stiff Glidewire was then advanced to the inferior cavoatrial junction. A 20 cm infusion length Uni fuse catheter was then placed at the suprarenal IVC, with the tip terminating in the inferior right atrium. Final image was stored. Patient tolerated the procedure well and remained hemodynamically unchanged. No blood loss. FINDINGS: Ultrasound survey demonstrates patent right common femoral vein. Limited venogram of the right iliac vein and IVC demonstrates no thrombus of the visualized  right iliac system. Venous reflux into the left iliac system with slow flow through the IVC. No occlusive thrombus of the infrarenal or suprarenal IVC. There is poor definition of the known right atrial thrombus given the low volume injection. IMPRESSION: Status post right common femoral vein access for placement of thrombolysis catheter and initiation of pharmacologic thrombolysis to treat known right atrial thrombus. Signed, Dulcy Fanny. Earleen Newport, DO [facility] Electronically Signed   By: Corrie Mckusick D.O.   On: 12/28/2015 16:56    Labs:  CBC:  Recent Labs  12/29/15 0306 12/29/15 1034 12/29/15 1645 12/30/15 0201  WBC 13.0* 15.9* 17.5* 15.6*  HGB 9.0* 8.0* 7.4* 7.0*  HCT 26.2* 23.4* 21.7* 20.6*  PLT 64* 55* 60* 76*    COAGS:  Recent Labs  12/25/15 2013 12/26/15 1740 12/27/15 12/27/15 0400  INR 1.13 2.01* 1.35 1.37  APTT  --  29  --   --  BMP:  Recent Labs  12/27/15 0400 12/28/15 0038 12/29/15 0306 12/30/15 0201  NA 140 140 143 144  K 3.6 3.8 3.3* 4.0  CL 110 110 116* 120*  CO2 22 23 20* 22  GLUCOSE 145* 133* 134* 144*  BUN 14 15 20 18   CALCIUM 8.4* 8.2* 7.6* 7.4*  CREATININE 0.78 0.84 0.88 0.70  GFRNONAA >60 >60 >60 >60  GFRAA >60 >60 >60 >60    LIVER FUNCTION TESTS:  Recent Labs  12/27/15 12/27/15 0400 12/29/15 0306  BILITOT 1.2 1.1 0.6  AST 51* 45* 52*  ALT 28 25 21   ALKPHOS 66 59 48  PROT 6.0* 5.6* 4.7*  ALBUMIN 3.5 3.1* 2.5*    Assessment and Plan:  Will follow Dr Earleen Newport discussed with Dr Nelda Marseille if IVC filter appropriate at this time Will hold on filter for now Rt venous sheath will be removed by RN today  Electronically Signed: Kasha Howeth A 12/30/2015, 1:07 PM   I spent a total of 15 Minutes at the the patient's bedside AND on the patient's hospital floor or unit, greater than 50% of which was counseling/coordinating care for Rt atrial thrombus-- lysis

## 2015-12-30 NOTE — Progress Notes (Signed)
Per IR Ok to remove femoral venous sheath. Advised to hold pressure 5-15 mins.

## 2015-12-30 NOTE — Progress Notes (Signed)
PULMONARY / CRITICAL CARE MEDICINE   Name: Bonnie Salazar MRN: [id number] DOB: 1941-05-05    ADMISSION DATE:  12/25/2015 CONSULTATION DATE:  12/26/15  REFERRING MD:  Trauma service  CHIEF COMPLAINT:  Hip fracture; need for mechanical ventilation post-operatively  HISTORY OF PRESENT ILLNESS:   Bonnie Salazar is a 75F who presented 12/25/15 after an fall at home with resultant left femoral neck fracture. She was taken to the OR for operative repair 6/16. The initial repair appeared uneventful and she was extubated in the PACU. Shortly thereafter, she complained of abdominal pain and was noted to have a tense abdomen. CT scan showed extravasation into the LLQ; she was taken to IR and underwent covered stent of the distal external iliac artery to the inguinal ligament. Vascular surgery was also consulted. No signs of distal ischemia on his exam. There is some concern for abdominal compartment syndrome, as well. MTP was initiated and she received 11u pRBC, 7FFP, 2 PLT pheresis, 2 cryoprecipitate.  Returned to ICU on mechanical ventilation.  She suffered a PEA arrest 6/18.     SUBJECTIVE:   Phos <1, replaced per [facility].   Neo requirements ranging from 20-80 mcg, remains on 350 mcg of fentanyl.  Hgb stable at 7, no evidence of further bleeding.  Platelets 76.    VITAL SIGNS: BP 93/56 mmHg  Pulse 99  Temp(Src) 98.8 F (37.1 C) (Oral)  Resp 26  Ht 5\' 7"  (1.702 m)  Wt 182 lb 12.2 oz (82.9 kg)  BMI 28.62 kg/m2  SpO2 95%  HEMODYNAMICS: CVP:  [7 mmHg-17 mmHg] 17 mmHg  VENTILATOR SETTINGS: Vent Mode:  [-] PRVC FiO2 (%):  [40 %-60 %] 40 % Set Rate:  [26 bmp] 26 bmp Vt Set:  [500 mL] 500 mL PEEP:  [10 cmH20] 10 cmH20 Plateau Pressure:  [23 cmH20-26 cmH20] 25 cmH20  INTAKE / OUTPUT:  Intake/Output Summary (Last 24 hours) at 12/30/15 0827 Last data filed at 12/30/15 0800  Gross per 24 hour  Intake 6118.18 ml  Output    637 ml  Net 5481.18 ml   PHYSICAL EXAMINATION:  General Acutely ill  appearing female, intubated and ventilated.   HEENT [place]/AT, PERRLA, EOM-I and MMM.  Pulmonary ETT in place, coarse BS diffusely.  Cardiovascular Regular; II/VI diastolic heart murmur.  Distal pulses palpable.  Abdomen Distended, hypoactive bowel sounds.  Musculoskeletal L hip post-surgical changes / dressing in place  Lymphatics No cervical, supraclavicular or axillary adenopathy.   Neurologic Grossly intact. No focal deficits.   Skin/Integuement No rash, no cyanosis, no clubbing. Pale. L [place] CVC in place. LLE edematous / generalized anasarca   LABS:  BMET  Recent Labs Lab 12/28/15 0038 12/29/15 0306 12/30/15 0201  NA 140 143 144  K 3.8 3.3* 4.0  CL 110 116* 120*  CO2 23 20* 22  BUN 15 20 18   CREATININE 0.84 0.88 0.70  GLUCOSE 133* 134* 144*   Electrolytes  Recent Labs Lab 12/28/15 0038 12/29/15 0306 12/30/15 0201  CALCIUM 8.2* 7.6* 7.4*  MG  --   --  1.8  PHOS  --   --  <1.0*   CBC  Recent Labs Lab 12/29/15 1034 12/29/15 1645 12/30/15 0201  WBC 15.9* 17.5* 15.6*  HGB 8.0* 7.4* 7.0*  HCT 23.4* 21.7* 20.6*  PLT 55* 60* 76*   Coag's  Recent Labs Lab 12/26/15 1740 12/27/15 12/27/15 0400  APTT 29  --   --   INR 2.01* 1.35 1.37   Sepsis Markers  Recent  Labs Lab 12/27/15 0040  LATICACIDVEN 2.4*   ABG  Recent Labs Lab 12/28/15 1918 12/28/15 2127 12/30/15 0355  PHART 7.168* 7.371 7.292*  PCO2ART 46.7* 32.9* 43.5  PO2ART 281.0* 73.0* 212.0*   Liver Enzymes  Recent Labs Lab 12/27/15 12/27/15 0400 12/29/15 0306  AST 51* 45* 52*  ALT 28 25 21   ALKPHOS 66 59 48  BILITOT 1.2 1.1 0.6  ALBUMIN 3.5 3.1* 2.5*   Cardiac Enzymes  Recent Labs Lab 12/27/15 0400 12/27/15 1037 12/27/15 1526  TROPONINI 1.20* 2.00* 2.06*   Glucose  Recent Labs Lab 12/29/15 0318 12/29/15 1118 12/29/15 1523 12/29/15 1946 12/30/15 0006 12/30/15 0336  GLUCAP 138* 95 92 124* 129* 133*   Imaging Dg Chest Port 1 View  12/30/2015  CLINICAL DATA:  Shortness  of breath. EXAM: PORTABLE CHEST 1 VIEW COMPARISON:  12/29/2015. FINDINGS: Endotracheal tube, NG tube, left subclavian line stable position. Cardiomegaly with diffuse bilateral pulmonary infiltrates pleural effusions consistent with congestive heart failure. No pneumothorax. IMPRESSION: 1. Lines and tubes stable position. 2. Cardiomegaly with diffuse bilateral pulmonary infiltrates and bilateral pleural effusions consistent with congestive heart failure. Electronically Signed   By: Marcello Moores  Register   On: 12/30/2015 06:48  PCXR: bilateral pulmonary edema ETT good position. NGT was in mid esophagus.   STUDIES:  ECHO 6/17 >> Left ventricle: The cavity size was normal. Systolic function was normal. The estimated ejection fraction was in the range of 50% to 55%. Wall motion was normal; there were no regional wall motion abnormalities.- Mitral valve: Moderate prolapse. There was severe regurgitation. - Left atrium: The atrium was mildly dilated.- Right ventricle: The cavity size was mildly dilated. Wall thickness was normal.- Right atrium: There is a large mobile mass in the right atrium  that prolapses toward the mitral valve. CT chest 6/17 >> Bilateral pulmonary emboli, nearly occlusive in the left lower lobe segmental branches, involving all of the segmental pulmonary arteries. Evidence of right heart strain with right/left heart ratio of over 1. Heterogeneous area of hypoattenuation within the right atrium may represent right atrial clot versus mixing of non-opacified blood from the inferior vena cava. Bilateral pleural effusions and atelectatic changes of bilateral lung bases. LE [place] 6/17 >> negative for DVT ECHO 6/18 >> LVEF 55-60%, normal wall motion, moderate MVP, mod to severe MR, RA mildly dilated, RA with large moblie pedunculated and irregular mass that prolapses through the TV c/w thrombus CT ABD/Pelvis 6/18 >> no evidence of new intraperitoneal or pelvic hemorrhage, increasing hematoma in left  proximal thigh, decreasing volume of pulmonary embolus in the bilateral lower lobes CT Head 6/18 >> No CT evidence of acute intracranial abnormality ECHO 6/19 >>   CULTURES: None  ANTIBIOTICS: Perioperative Ancef  SIGNIFICANT EVENTS: 6/17  Admit after falle with L hip fx,  L HIP replacement, extubated, developed abd pain found to have intraabdominal hematoma.   6/17  AF w/ CE elevation so ECHO checked-->round RA thrombus. Started on heparin. Cards, CVTS and IR involved in care. Later that evening increased resp distress. FIO2 req increased. CT chest w/ bilateral PE 6/18  remaining hypoxic. Hgb stable, repeat ECHO obtained and consideration for EKOS.  PEA Arrest 6/20  On neo 20-80, sedation, heparin.  No weaning.  Anasarca  LINES/TUBES: Foley 6/16  PIV ETT 6/16-->6/17 >> 6/18 >> L  DLC 6/16 >>  DISCUSSION: Ms. Mancil is a 69F who remains intubated after a complicated L hip replacement with arterial avulsion of the left external iliac artery requiring covered stent placement  by IR c/b intraabdominal / pelvic hematoma, hgb has been stable. Course complicated by increased CEs, AF, ECHO w/ RA thrombus, later c/b PE, and hypoxic resp failure. She was placed on heparin and multiple specialties have been involved.  6/18 am RV fxn worse and there is still large clot in the RA. 6/18  underwent catheter directed lysis per IR.  She later developed a drop in blood pressure while on TPA and it was discontinued.  Case was discussed with VVS and she was deemed not a surgical candidate.  The patient was stabilized and went for repeat CT of abd/pelvis which showed increased left thigh hematoma, no increase in pelvic hemorrhage.   TPA stopped and heparin gtt initiated.      ASSESSMENT / PLAN:  PULMONARY A: Acute Hypoxic Respiratory Failure  Mild Pulmonary edema  Acute bilateral PE-->RV strain has increased but not sure that this is resp for her resp failure   P:   PRVC 8 cc/kg, rate 26 Titrate  O2 for sats >94%. Continue systemic heparin PSV wean as patient tolerates, not a candidate 6/20  CARDIOVASCULAR A:  Troponin elevation -->likely demand ischemia  Heart murmur Intermittent AF Severe MR RA clott;  Now w/ acute PE (likely migrated)-->appreciate cards, thoracic and interventional assist.  Repeat echo 6/19 without a clot. PEA Arrest - 6/18, approx 10 minutes Shock  P:  Neo to maintain MAP >65. Follow CVP, 18 on 6/20 Continue heparin gtt per pharmacy. Assess cortisol   RENAL A:   Metabolic Acidosis Hypophosphatemia  P:   BMET in AM. Replace electrolytes as indicated. Lasix 20 mg x1   GASTROINTESTINAL A:   Concern for abdominal compartment syndrome--> no evidence of active bleeding  P:   Continue TF Famotidine for ppx  HEMATOLOGIC A:   Arterial hemorrhage s/p stenting and requiring massive transfusion propofol RA thrombus-->likely propagated from right pelvic vein compression from hematoma-->migrated and now with BILATERAL PE P:  Trend CBC Transfuse per ICU protocol Continue heparin  Repeat right sided ECHO no clot seen on 6/19.  INFECTIOUS A:   No acute issues - leukocytosis likely stress response S/p periop Ancef P:   Monitor fever curve / WBC  ENDOCRINE A:   No acute issues  P:   Trend glucose on chemistries   NEUROLOGIC A:   Anxiety and post-op pain  P:   RASS goal: -1 Fent drip for pain PRN versed Add space boots   FAMILY  - Updates:   Husband updated at bedside 6/20.     Noe Gens, NP-C [place] [facility] Pgr: [phone] or if no answer [phone] 12/30/2015, 8:28 AM  Attending Note:  75 year old female with a hip fracture with a repair process that resulted in an iliac bleed then subsequently a PE, RA clot with subsequent tPA then hemorrhage and a PEA arrest.  Patient stabilized and hemodynamics are improving.  On exam, she is profoundly fluid overloaded but mental status is improved.  Will diurese today then  will begin PS trials in AM with hope of extubation since she is becoming more stable.  The patient is critically ill with multiple organ systems failure and requires high complexity decision making for assessment and support, frequent evaluation and titration of therapies, application of advanced monitoring technologies and extensive interpretation of multiple databases.   Critical Care Time devoted to patient care services described in this note is  35  Minutes. This time reflects time of care of this signee Dr [name].  This critical care time does not reflect procedure time, or teaching time or supervisory time of PA/NP/Med student/Med Resident etc but could involve care discussion time.  Rush Farmer, M.D. [facility] Pulmonary/Critical Care Medicine. Pager: [phone]. After hours pager: [phone].

## 2015-12-30 NOTE — Progress Notes (Signed)
I have examined her BLE. 1+DP, compartments are soft, toes are WWP. Surgical dressing is benign.   She is stable from an orthopedic standpoint.   I will remove surgical dressing later this week.      Kamiryn Bezanson D

## 2015-12-30 NOTE — Progress Notes (Signed)
ANTICOAGULATION CONSULT NOTE  Pharmacy Consult for Heparin Indication: pulmonary embolus and and history of atrial fibrillation  No Known Allergies  Patient Measurements: Height: 5\' 7"  (170.2 cm) Weight: 182 lb 12.2 oz (82.9 kg) IBW/kg (Calculated) : 61.6 Heparin Dosing Weight: 77 kg  Vital Signs: Temp: 98.4 F (36.9 C) (06/20 0336) Temp Source: Oral (06/20 0336) BP: 89/63 mmHg (06/20 0300) Pulse Rate: 101 (06/20 0315)  Labs:  Recent Labs  12/27/15 0400  12/27/15 1037  12/27/15 1526  12/28/15 0038  12/28/15 2115 12/29/15 0306 12/29/15 1034 12/29/15 1645 12/30/15 0201  HGB 11.1*  < >  --   < >  --   < >  --   < > 10.2* 9.0* 8.0* 7.4* 7.0*  HCT 32.3*  < >  --   < >  --   < >  --   < > 30.3* 26.2* 23.4* 21.7* 20.6*  PLT  --   < >  --   --   --   --   --   < > 72* 64* 55* 60* 76*  LABPROT 17.0*  --   --   --   --   --   --   --   --   --   --   --   --   INR 1.37  --   --   --   --   --   --   --   --   --   --   --   --   HEPARINUNFRC  --   --   --   --   --   < >  --   < > <0.10*  --   --  0.19* 0.24*  CREATININE 0.78  --   --   --   --   --  0.84  --   --  0.88  --   --   --   TROPONINI 1.20*  --  2.00*  --  2.06*  --   --   --   --   --   --   --   --   < > = values in this interval not displayed.  Estimated Creatinine Clearance: 62.1 mL/min (by C-G formula based on Cr of 0.88).  Assessment: 75 year old female presented with mechanical fall and femoral neck fracture who is s/p THA on 6/16/7. Found to have a large mobile mass in the right atrium and bilateral PE with RV strain. Patient received thrombolytic therapy via EKOS on 6/18. Patient became hemodynamically unstable and then suffered PEA arrest on 6/18 PM with ROSC in 82min. Patient received 2 units PRBCs and 2 of Cryo - Heparin and alteplase were held. No surgical exploration due to unknown source of CV collapse. CT scan showed no increase in pelvic hematoma and some additional hematoma in left thigh. Heparin  restarted 6/19 for PE. No bolus due to concerns of prior bleed and low platelets.   Heparin level slightly subtherapeutic (0.24) on 750 units/hr. Hgb dropped further to 7, plts stable at 45. Spoke with RN, Izola Price, who states that pt abd distended, no overt bleeding.  Goal of Therapy:  Heparin level 0.3-0.7 units/ml Monitor platelets by anticoagulation protocol: Yes   Plan:  - Continue heparin at 750 units/hr for now - Recheck heparin level in 6 hours - ?repeat CT scan  Sherlon Handing, PharmD, BCPS Clinical pharmacist, pager [phone] 12/30/2015 3:39 AM

## 2015-12-30 NOTE — Care Management Important Message (Signed)
Important Message  Patient Details  Name: Bonnie Salazar MRN: [id number] Date of Birth: 09-15-1940   Medicare Important Message Given:  Yes    Nathen May 12/30/2015, 10:46 AM

## 2015-12-30 NOTE — Progress Notes (Signed)
Occupational Therapy Discharge Patient Details Name: Bonnie Salazar MRN: [id number] DOB: 13-Nov-1940 Today's Date: 12/30/2015 Time:  -     Patient discharged from OT services secondary to medical decline - will need to re-order OT to resume therapy services.  Please see latest therapy progress note for current level of functioning and progress toward goals.    Progress and discharge plan discussed with patient and/or caregiver: OT orders discontinued.    [place], [place], OTR/L [id number]  12/30/2015, 10:20 AM

## 2015-12-31 ENCOUNTER — Inpatient Hospital Stay (HOSPITAL_COMMUNITY): Payer: Medicare HMO

## 2015-12-31 ENCOUNTER — Encounter (HOSPITAL_COMMUNITY): Payer: Self-pay | Admitting: General Surgery

## 2015-12-31 LAB — BLOOD GAS, ARTERIAL
ACID-BASE DEFICIT: 3.6 mmol/L — AB (ref 0.0–2.0)
BICARBONATE: 22 meq/L (ref 20.0–24.0)
Drawn by: 12971
FIO2: 0.4
LHR: 26 {breaths}/min
MECHVT: 500 mL
O2 SAT: 94.1 %
PATIENT TEMPERATURE: 98.6
PCO2 ART: 47.1 mmHg — AB (ref 35.0–45.0)
PH ART: 7.29 — AB (ref 7.350–7.450)
TCO2: 23.4 mmol/L (ref 0–100)
pO2, Arterial: 73.1 mmHg — ABNORMAL LOW (ref 80.0–100.0)

## 2015-12-31 LAB — BASIC METABOLIC PANEL
ANION GAP: 2 — AB (ref 5–15)
ANION GAP: 3 — AB (ref 5–15)
BUN: 18 mg/dL (ref 6–20)
BUN: 20 mg/dL (ref 6–20)
CALCIUM: 7.4 mg/dL — AB (ref 8.9–10.3)
CALCIUM: 7.3 mg/dL — AB (ref 8.9–10.3)
CO2: 22 mmol/L (ref 22–32)
CO2: 21 mmol/L — ABNORMAL LOW (ref 22–32)
CREATININE: 0.7 mg/dL (ref 0.44–1.00)
Chloride: 120 mmol/L — ABNORMAL HIGH (ref 101–111)
Chloride: 120 mmol/L — ABNORMAL HIGH (ref 101–111)
Creatinine, Ser: 0.7 mg/dL (ref 0.44–1.00)
GFR calc Af Amer: >60 mL/min (ref >60)
GLUCOSE: 144 mg/dL — AB (ref 65–99)
Glucose, Bld: 143 mg/dL — ABNORMAL HIGH (ref 65–99)
POTASSIUM: 4.1 mmol/L (ref 3.5–5.1)
Potassium: 4 mmol/L (ref 3.5–5.1)
SODIUM: 144 mmol/L (ref 135–145)
Sodium: 144 mmol/L (ref 135–145)

## 2015-12-31 LAB — CBC
HEMATOCRIT: 18.2 % — AB (ref 36.0–46.0)
HEMATOCRIT: 25.6 % — AB (ref 36.0–46.0)
HEMATOCRIT: 26.3 % — AB (ref 36.0–46.0)
HEMOGLOBIN: 5.8 g/dL — AB (ref 12.0–15.0)
HEMOGLOBIN: 8.4 g/dL — AB (ref 12.0–15.0)
HEMOGLOBIN: 8.6 g/dL — AB (ref 12.0–15.0)
MCH: 29.7 pg (ref 26.0–34.0)
MCH: 29.4 pg (ref 26.0–34.0)
MCH: 29.2 pg (ref 26.0–34.0)
MCHC: 31.9 g/dL (ref 30.0–36.0)
MCHC: 32.8 g/dL (ref 30.0–36.0)
MCHC: 32.7 g/dL (ref 30.0–36.0)
MCV: 93.3 fL (ref 78.0–100.0)
MCV: 89.5 fL (ref 78.0–100.0)
MCV: 89.2 fL (ref 78.0–100.0)
Platelets: 120 10*3/uL — ABNORMAL LOW (ref 150–400)
Platelets: 118 10*3/uL — ABNORMAL LOW (ref 150–400)
Platelets: 103 10*3/uL — ABNORMAL LOW (ref 150–400)
RBC: 1.95 MIL/uL — ABNORMAL LOW (ref 3.87–5.11)
RBC: 2.86 MIL/uL — AB (ref 3.87–5.11)
RBC: 2.95 MIL/uL — ABNORMAL LOW (ref 3.87–5.11)
RDW: 17.2 % — AB (ref 11.5–15.5)
RDW: 16.3 % — ABNORMAL HIGH (ref 11.5–15.5)
RDW: 17.2 % — AB (ref 11.5–15.5)
WBC: 18.8 10*3/uL — AB (ref 4.0–10.5)
WBC: 16.4 10*3/uL — AB (ref 4.0–10.5)
WBC: 14.3 10*3/uL — ABNORMAL HIGH (ref 4.0–10.5)

## 2015-12-31 LAB — GLUCOSE, CAPILLARY
GLUCOSE-CAPILLARY: 123 mg/dL — AB (ref 65–99)
GLUCOSE-CAPILLARY: 146 mg/dL — AB (ref 65–99)
GLUCOSE-CAPILLARY: 141 mg/dL — AB (ref 65–99)
Glucose-Capillary: 119 mg/dL — ABNORMAL HIGH (ref 65–99)
Glucose-Capillary: 146 mg/dL — ABNORMAL HIGH (ref 65–99)
Glucose-Capillary: 209 mg/dL — ABNORMAL HIGH (ref 65–99)

## 2015-12-31 LAB — PREPARE RBC (CROSSMATCH)

## 2015-12-31 LAB — HEPARIN LEVEL (UNFRACTIONATED): Heparin Unfractionated: 0.28 IU/mL — ABNORMAL LOW (ref 0.30–0.70)

## 2015-12-31 LAB — COMPREHENSIVE METABOLIC PANEL
ALK PHOS: 77 U/L (ref 38–126)
ALT: 24 U/L (ref 14–54)
AST: 60 U/L — AB (ref 15–41)
Albumin: 2.1 g/dL — ABNORMAL LOW (ref 3.5–5.0)
Anion gap: 4 — ABNORMAL LOW (ref 5–15)
BUN: 21 mg/dL — AB (ref 6–20)
CALCIUM: 7.3 mg/dL — AB (ref 8.9–10.3)
CHLORIDE: 120 mmol/L — AB (ref 101–111)
CO2: 21 mmol/L — AB (ref 22–32)
CREATININE: 0.74 mg/dL (ref 0.44–1.00)
GFR calc Af Amer: >60 mL/min (ref >60)
GFR calc non Af Amer: >60 mL/min (ref >60)
Glucose, Bld: 122 mg/dL — ABNORMAL HIGH (ref 65–99)
Potassium: 4.1 mmol/L (ref 3.5–5.1)
SODIUM: 145 mmol/L (ref 135–145)
Total Bilirubin: 1.1 mg/dL (ref 0.3–1.2)
Total Protein: 4.3 g/dL — ABNORMAL LOW (ref 6.5–8.1)

## 2015-12-31 LAB — APTT: APTT: 33 s (ref 24–37)

## 2015-12-31 LAB — PROTIME-INR
INR: 1.3 (ref 0.00–1.49)
Prothrombin Time: 16.3 seconds — ABNORMAL HIGH (ref 11.6–15.2)

## 2015-12-31 LAB — PHOSPHORUS: PHOSPHORUS: 1.2 mg/dL — AB (ref 2.5–4.6)

## 2015-12-31 MED ORDER — IOPAMIDOL (ISOVUE-300) INJECTION 61%
INTRAVENOUS | Status: AC
  Administered 2015-12-31: 30 mL
  Filled 2015-12-31: qty 100

## 2015-12-31 MED ORDER — ALBUMIN HUMAN 25 % IV SOLN
25.0000 g | Freq: Four times a day (QID) | INTRAVENOUS | Status: AC
  Administered 2015-12-31 – 2016-01-01 (×4): 25 g via INTRAVENOUS
  Filled 2015-12-31 (×5): qty 100

## 2015-12-31 MED ORDER — FUROSEMIDE 10 MG/ML IJ SOLN: 20.0000 mg | Freq: Three times a day (TID) | INTRAMUSCULAR | Status: DC

## 2015-12-31 MED ORDER — LIDOCAINE HCL 1 % IJ SOLN
INTRAMUSCULAR | Status: AC
  Administered 2015-12-31: 10 mL
  Filled 2015-12-31: qty 20

## 2015-12-31 MED ORDER — WHITE PETROLATUM GEL
Status: AC
  Administered 2015-12-31: 0.2
  Filled 2015-12-31: qty 1

## 2015-12-31 MED ORDER — DOXAZOSIN MESYLATE 1 MG PO TABS
1.0000 mg | ORAL_TABLET | Freq: Every day | ORAL | Status: DC
  Filled 2015-12-31: qty 1

## 2015-12-31 MED ORDER — SODIUM PHOSPHATES 45 MMOLE/15ML IV SOLN
30.0000 mmol | Freq: Once | INTRAVENOUS | Status: AC
  Administered 2015-12-31: 30 mmol via INTRAVENOUS
  Filled 2015-12-31: qty 10

## 2015-12-31 MED ORDER — SODIUM CHLORIDE 0.9 % IV SOLN
2.0000 g | Freq: Once | INTRAVENOUS | Status: AC
  Administered 2015-12-31: 2 g via INTRAVENOUS
  Filled 2015-12-31: qty 20

## 2015-12-31 MED ORDER — METOLAZONE 10 MG PO TABS
10.0000 mg | ORAL_TABLET | Freq: Once | ORAL | Status: AC
  Administered 2015-12-31: 10 mg via ORAL
  Filled 2015-12-31: qty 1

## 2015-12-31 MED ORDER — SODIUM CHLORIDE 0.9 % IV SOLN
Freq: Once | INTRAVENOUS | Status: AC
Start: 2015-12-31 — End: 2015-12-31
  Administered 2015-12-31: 05:00:00 via INTRAVENOUS

## 2015-12-31 MED ORDER — FUROSEMIDE 10 MG/ML IJ SOLN
5.0000 mg/h | INTRAVENOUS | Status: AC
  Administered 2015-12-31 – 2016-01-01 (×2): 10 mg/h via INTRAVENOUS
  Filled 2015-12-31 (×3): qty 25

## 2015-12-31 NOTE — Progress Notes (Signed)
CRITICAL VALUE ALERT  Critical value received:  Hgb 5.8  Date of notification:  12/31/2015  Time of notification:  04:44  Critical value read back:Yes.    Nurse who received alert:  BShepherd, RN  MD notified (1st page):  Halford Chessman  Time of first page:  04:50  MD notified (2nd page):  Time of second page:  Responding MD:  Halford Chessman  Time MD responded:  04:51

## 2015-12-31 NOTE — Sedation Documentation (Signed)
Pt on fentanyl gtt tolerating procedure with no change in VS

## 2015-12-31 NOTE — Progress Notes (Signed)
Patient transported to IR for IVC procedure. Patient was on full vent support and FiO2 of 100%. No complications were noted during transport to and from IR. Patient returned to 2M05. RT will continue to monitor patient.

## 2015-12-31 NOTE — Progress Notes (Signed)
ANTICOAGULATION CONSULT NOTE - Follow Up Consult  Pharmacy Consult for Heparin Indication: pulmonary embolus and and history of atrial fibrillation  No Known Allergies Patient Measurements: Height: 5\' 7"  (170.2 cm) Weight: 192 lb 10.9 oz (87.4 kg) IBW/kg (Calculated) : 61.6 Heparin Dosing Weight: 77 kg Vital Signs: Temp: 99.4 F (37.4 C) (06/21 0739) Temp Source: Oral (06/21 0739) BP: 107/66 mmHg (06/21 0700) Pulse Rate: 87 (06/21 0730) Labs:  Recent Labs  12/29/15 0306  12/29/15 1645 12/30/15 0201 12/30/15 1000 12/30/15 1528 12/31/15 0400  HGB 9.0*  < > 7.4* 7.0*  --   --  5.8*  HCT 26.2*  < > 21.7* 20.6*  --   --  18.2*  PLT 64*  < > 60* 76*  --   --  120*  HEPARINUNFRC  --   --  0.19* 0.24* 0.29* 0.37 0.28*  CREATININE 0.88  --   --  0.70  --   --  0.74  < > = values in this interval not displayed.  Estimated Creatinine Clearance: 70 mL/min (by C-G formula based on Cr of 0.74).  Assessment: 75 year old woman admitted 12/25/2015 for mechanical fall and femoral neck fracture, now s/p THA on 12/26/15. Found to have a large mobile mass in RA and bilateral PE with RV strain, s/p EKOS on 6/18. Pharmacy consulted to dose heparin.  Hospital course complicated by PEA arrest on 6/18 PM with ROSC in 69min. Received 2 units PRBCs and 2 of Cryo. Heparin and alteplase were held, no surgical exploration d/t unknown source CV collapse. CT scan showed no increase in pelvic hematoma and some additional hematoma in left thigh.   Hgb down this AM at 5.8 - abdomen remains distended. Receiving 2 units of blood. Discussed with Dr Nelda Marseille- Received telephone orders to hold heparin and plan for IVC filter placement.   Goal of Therapy:  Heparin level 0.3-0.7 units/ml Monitor platelets by anticoagulation protocol: Yes   Plan:  Hold heparin. Follow-up IVC filter vs anticoagulation plan.   Sloan Leiter, PharmD, BCPS Clinical Pharmacist [phone]  12/31/2015 7:40 AM

## 2015-12-31 NOTE — Progress Notes (Signed)
Patient ID: Bonnie Salazar, female   DOB: 28-Nov-1940, 75 y.o.   MRN: [id number]    Referring Physician(s): Dr. 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  Medications: Prior to Admission medications   Medication Sig Start Date End Date Taking? Authorizing Provider  CALCIUM PO Take 2 tablets by mouth daily.   Yes Historical Provider, MD  chlorhexidine (PERIDEX) 0.12 % solution 15 mLs by Mouth Rinse route 2 (two) times daily. 12/22/15  Yes Historical Provider, MD  Cholecalciferol (VITAMIN D PO) Take 1 capsule by mouth daily.   Yes Historical Provider, MD  fluticasone (FLONASE) 50 MCG/ACT nasal spray Place 2 sprays into the nose daily as needed for allergies.  11/01/15  Yes Historical Provider, MD  GINKGO BILOBA PO Take 1 capsule by mouth daily.   Yes Historical Provider, MD  Misc Natural Products (GLUCOSAMINE CHONDROITIN TRIPLE) TABS Take 2 tablets by mouth daily.   Yes Historical Provider, MD  Multiple Vitamin (MULTIVITAMIN WITH MINERALS) TABS tablet Take 1 tablet by mouth daily.   Yes Historical Provider, MD  Multiple Vitamins-Minerals (ICAPS AREDS 2) CAPS Take 1 capsule by mouth 2 (two) times daily.   Yes Historical Provider, MD  naproxen sodium (ANAPROX) 220 MG tablet Take 220 mg by mouth at bedtime as needed (pain).   Yes Historical Provider, MD  Omega-3 Fatty Acids (OMEGA 3 PO) Take 1 capsule by mouth daily.   Yes Historical Provider, MD  aspirin EC 325 MG tablet Take 1 tablet (325 mg total) by mouth daily.  12/26/15   Geradine Girt, DO  omeprazole (PRILOSEC) 20 MG capsule Take 1 capsule (20 mg total) by mouth daily. 12/26/15   Geradine Girt, DO  ondansetron (ZOFRAN) 4 MG tablet Take 1 tablet (4 mg total) by mouth every 8 (eight) hours as needed for nausea or vomiting. 12/26/15   Geradine Girt, DO  oxyCODONE-acetaminophen (ROXICET) 5-325 MG tablet Take 1-2 tablets by mouth every 4 (four) hours as needed for severe pain. 12/26/15   Geradine Girt, DO    Vital Signs: BP 129/89 mmHg  Pulse 86  Temp(Src) 98.4 F (36.9 C) (Oral)  Resp 26  Ht 5\' 7"  (1.702 m)  Wt 192 lb 10.9 oz (87.4 kg)  BMI 30.17 kg/m2  SpO2 98%  Physical Exam: Gen: critically ill on vent Heart: regular Lungs: Coarse vent sounds Abd: tight, diffuse anasarca  Imaging: Ct Head Wo Contrast  12/28/2015  CLINICAL DATA:  75 year old female with acute respiratory distress during venous thrombolysis. EXAM: CT HEAD WITHOUT CONTRAST TECHNIQUE: Contiguous axial images were obtained from the base of the skull through the vertex without intravenous contrast. COMPARISON:  CT 08/20/2002 FINDINGS: Note that there is venous contamination given the recent abdominal CT. No large acute intracranial hemorrhage. No midline shift or mass effect. Ventricular system unremarkable. Gray-white differentiation maintained. No significant white matter disease. Hyperdense mass along the inner table of the left frontal bone measures 5 mm x 10 mm, compatible with a small meningioma without local mass effect. This was identified (by report) on the CT of 08/20/2002. Unremarkable appearance of the calvarium without  acute fracture or aggressive lesion. Unremarkable appearance of the scalp soft tissues. Unremarkable appearance of the bilateral orbits. Mastoid air cells are clear. No significant paranasal sinus disease IMPRESSION: No CT evidence of acute intracranial abnormality. 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 Signed,  Dulcy Fanny. Earleen Newport, DO [facility] Rad[facility] Electronically Signed   By: Corrie Mckusick D.O.   On: 12/28/2015 21:28   Ct Angio Chest Pe W Or Wo Contrast  12/27/2015  CLINICAL DATA:  Shortness of breath. Atrial mass versus blood clot seen on cardiac echo. EXAM: CT ANGIOGRAPHY CHEST WITH CONTRAST TECHNIQUE: Multidetector CT imaging of the chest was performed using the standard protocol during bolus administration of intravenous contrast. Multiplanar CT image reconstructions and MIPs were obtained to evaluate the vascular anatomy. CONTRAST:  80 cc Isovue 370 intravenously. COMPARISON:  Chest radiograph 12/26/2015 FINDINGS: Mediastinum/Lymph Nodes: Left subclavian approach central venous catheter terminates in the superior vena cava. There are bilateral pulmonary emboli. There is a nearly completely occlusive thrombus within the left lower lobar pulmonary artery, with clot extending to the basilar segmental branches. Nonocclusive clot is seen within the left upper, lingular lobar pulmonary artery, right upper, right middle and right lower lobe pulmonary arteries. There is evidence of right heart strain with right/left ventricular ratio over 1. Irregular hypoattenuated area within the right atrium may represent mixing of non-opacified blood from the inferior vena cava with contrast opacified blood, versus an intramural thrombus. No masses or pathologically enlarged lymph nodes identified. Lungs/Pleura: There are bilateral moderate in size pleural effusions. There is segmental atelectasis of the right lower lobe and subsegmental atelectasis of the left lower lobe. Upper abdomen: Water density abdominal ascites. Musculoskeletal: No chest wall mass or suspicious bone lesions identified. Review of the MIP images confirms the above findings. IMPRESSION: Bilateral pulmonary emboli, nearly occlusive in the left lower lobe segmental branches, involving all of the segmental  pulmonary arteries. Evidence of right heart strain with right/left heart ratio of over 1. Heterogeneous area of hypoattenuation within the right atrium may represent right atrial clot versus mixing of non-opacified blood from the inferior vena cava. Given presence of extensive bilateral pulmonary emboli, presence of atrial clot becomes more plausible. If clinically important, gated cardiac CT or cardiac MRI may be pursued. Bilateral pleural effusions and atelectatic changes of bilateral lung bases. Abdominal ascites. Critical Value/emergent results were called by telephone at the time of interpretation on 12/27/2015 at 7:06 pm to Dr. Halford Chessman, who verbally acknowledged these results. Electronically Signed   By: Fidela Salisbury M.D.   On: 12/27/2015 19:19   Ir Mariana Arn Ivc  12/28/2015  INDICATION: 75 year old female, status post postoperative hemorrhage left external iliac artery branch vessel, with embolization for hemostasis. Subsequent development of respiratory decompensation with cardiac echo demonstrating giant atrial thrombus. Surgical embolectomy carries a high morbidity/ mortality given the patient's current ICU status. Alternative treatment options include catheter embolectomy/thrombectomy, as well as catheter directed thrombolysis. Family has elected to proceed with least invasive therapy. EXAM: IR INFUSION THROMBOL ARTERIAL INITIAL (MS); IR ULTRASOUND GUIDANCE VASC ACCESS RIGHT; INFERIOR VENA CAVOGRAM COMPARISON:  CT pelvis 12/26/2015 CT chest 12/27/2015 Cardiac ECHO 12/27/2015 and 12/28/2015 MEDICATIONS: None. ANESTHESIA/SEDATION: Versed 0.5 mg IV; Fentanyl 0 mcg IV No moderate sedation The patient was continuously monitored during the procedure by the interventional radiology nurse under my direct supervision. FLUOROSCOPY TIME:  Fluoroscopy Time: 0 minutes 36 seconds (58 mGy). COMPLICATIONS: None TECHNIQUE: Informed written consent was obtained  from the patient and the patient's husband after a  thorough discussion of the procedural risks, benefits and alternatives. All questions were addressed. Maximal Sterile Barrier Technique was utilized including caps, mask, sterile gowns, sterile gloves, sterile drape, hand hygiene and skin antiseptic. A timeout was performed prior to the initiation of the procedure. Patient positioned supine position on the fluoroscopy table. The right inguinal region was prepped and draped in the usual sterile fashion. Ultrasound survey of the right inguinal region was performed with images stored and sent to PACs. A single wall 19 gauge needle was used access the right common femoral vein under ultrasound. With excellent venous blood flow returned, an 035 Bentson wire was passed through the needle, observed enter the abdominal IVC under fluoroscopy. The needle was removed, and a standard 5 [place] vascular sheath was placed. The dilator was removed and the sheath was flushed. Limited venogram was then performed of the right iliac vein and the IVC. A stiff Glidewire was then advanced to the inferior cavoatrial junction. A 20 cm infusion length Uni fuse catheter was then placed at the suprarenal IVC, with the tip terminating in the inferior right atrium. Final image was stored. Patient tolerated the procedure well and remained hemodynamically unchanged. No blood loss. FINDINGS: Ultrasound survey demonstrates patent right common femoral vein. Limited venogram of the right iliac vein and IVC demonstrates no thrombus of the visualized right iliac system. Venous reflux into the left iliac system with slow flow through the IVC. No occlusive thrombus of the infrarenal or suprarenal IVC. There is poor definition of the known right atrial thrombus given the low volume injection. IMPRESSION: Status post right common femoral vein access for placement of thrombolysis catheter and initiation of pharmacologic thrombolysis to treat known right atrial thrombus. Signed, Dulcy Fanny. Earleen Newport, DO Vascular  and Interventional Radiology Specialists [facility] Radiology Electronically Signed   By: Corrie Mckusick D.O.   On: 12/28/2015 16:56   Ir US Guide Vasc Access Right  12/28/2015  INDICATION: 75 year old female, status post postoperative hemorrhage left external iliac artery branch vessel, with embolization for hemostasis. Subsequent development of respiratory decompensation with cardiac echo demonstrating giant atrial thrombus. Surgical embolectomy carries a high morbidity/ mortality given the patient's current ICU status. Alternative treatment options include catheter embolectomy/thrombectomy, as well as catheter directed thrombolysis. Family has elected to proceed with least invasive therapy. EXAM: IR INFUSION THROMBOL ARTERIAL INITIAL (MS); IR ULTRASOUND GUIDANCE VASC ACCESS RIGHT; INFERIOR VENA CAVOGRAM COMPARISON:  CT pelvis 12/26/2015 CT chest 12/27/2015 Cardiac ECHO 12/27/2015 and 12/28/2015 MEDICATIONS: None. ANESTHESIA/SEDATION: Versed 0.5 mg IV; Fentanyl 0 mcg IV No moderate sedation The patient was continuously monitored during the procedure by the interventional radiology nurse under my direct supervision. FLUOROSCOPY TIME:  Fluoroscopy Time: 0 minutes 36 seconds (58 mGy). COMPLICATIONS: None TECHNIQUE: Informed written consent was obtained from the patient and the patient's husband after a thorough discussion of the procedural risks, benefits and alternatives. All questions were addressed. Maximal Sterile Barrier Technique was utilized including caps, mask, sterile gowns, sterile gloves, sterile drape, hand hygiene and skin antiseptic. A timeout was performed prior to the initiation of the procedure. Patient positioned supine position on the fluoroscopy table. The right inguinal region was prepped and draped in the usual sterile fashion. Ultrasound survey of the right inguinal region was performed with images stored and sent to PACs. A single wall 19 gauge needle was used access the right common  femoral vein under ultrasound. With excellent venous blood flow returned, an 035 Bentson wire  was passed through the needle, observed enter the abdominal IVC under fluoroscopy. The needle was removed, and a standard 5 [place] vascular sheath was placed. The dilator was removed and the sheath was flushed. Limited venogram was then performed of the right iliac vein and the IVC. A stiff Glidewire was then advanced to the inferior cavoatrial junction. A 20 cm infusion length Uni fuse catheter was then placed at the suprarenal IVC, with the tip terminating in the inferior right atrium. Final image was stored. Patient tolerated the procedure well and remained hemodynamically unchanged. No blood loss. FINDINGS: Ultrasound survey demonstrates patent right common femoral vein. Limited venogram of the right iliac vein and IVC demonstrates no thrombus of the visualized right iliac system. Venous reflux into the left iliac system with slow flow through the IVC. No occlusive thrombus of the infrarenal or suprarenal IVC. There is poor definition of the known right atrial thrombus given the low volume injection. IMPRESSION: Status post right common femoral vein access for placement of thrombolysis catheter and initiation of pharmacologic thrombolysis to treat known right atrial thrombus. Signed, Dulcy Fanny. Earleen Newport, DO [facility] and Interventional Radiology Specialists [facility] Radiology Electronically Signed   By: Corrie Mckusick D.O.   On: 12/28/2015 16:56   Dg Chest Port 1 View  12/31/2015  CLINICAL DATA:  Acute respiratory failure. EXAM: PORTABLE CHEST 1 VIEW COMPARISON:  12/30/2015. FINDINGS: Endotracheal tube and NG tube in stable position. Left subclavian line stable position. Stable cardiomegaly. Diffuse bilateral pulmonary infiltrates/ edema with slight interim improvement. Persistent bilateral effusions noted also with slight improvement. No pneumothorax . IMPRESSION: 1. Lines and tubes stable position. 2. Cardiomegaly with  diffuse bilateral pulmonary infiltrates consistent with pulmonary edema. Slight interim improvement. Bilateral pleural effusions. Slight interim improvement . Electronically Signed   By: Leith[place]Hatfield   On: 12/31/2015 06:57   Dg Chest Port 1 View  12/30/2015  CLINICAL DATA:  Shortness of breath. EXAM: PORTABLE CHEST 1 VIEW COMPARISON:  12/29/2015. FINDINGS: Endotracheal tube, NG tube, left subclavian line stable position. Cardiomegaly with diffuse bilateral pulmonary infiltrates pleural effusions consistent with congestive heart failure. No pneumothorax. IMPRESSION: 1. Lines and tubes stable position. 2. Cardiomegaly with diffuse bilateral pulmonary infiltrates and bilateral pleural effusions consistent with congestive heart failure. Electronically Signed   By: Marcello Moores  Register   On: 12/30/2015 06:48   Dg Chest Port 1 View  12/29/2015  CLINICAL DATA:  75 year old female with a history of left hip replacement, postoperative hemorrhage and subsequent embolization, and development of pulmonary emboli. 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 EXAM: PORTABLE CHEST 1 VIEW COMPARISON:  Multiple prior FINDINGS: Cardiomediastinal silhouette unchanged in size and contour. Similar position of defibrillator pads on the chest wall. Unchanged position of the endotracheal tube, which terminates suitably above the carina approximately 3.4 cm. Gastric tube unchanged projecting over the mediastinum and terminating and out of the field of view. Unchanged position of left subclavian central catheter which terminates in the superior vena cava. Opacities at the bilateral lung bases partially obscuring the hemidiaphragms and the heart borders. Veiled opacity in the lower lungs. Mixed interstitial and airspace opacities in the mid and lower lobes. No pneumothorax. No displaced rib fracture. IMPRESSION: Opacities the bilateral lung bases, likely a combination of pleural effusion and  atelectasis/ consolidation. Unchanged position of support apparatus, including defibrillator pads, gastric tube, left subclavian central venous catheter, and endotracheal tube which terminates suitably above the carina. Signed, Dulcy Fanny. Earleen Newport, DO Vascular and Interventional Radiology  Specialists [facility] Radiology Electronically Signed   By: Corrie Mckusick D.O.   On: 12/29/2015 07:05   Dg Chest Port 1 View  12/28/2015  CLINICAL DATA:  Evaluate endotracheal and orogastric tube placement. Acute respiratory failure. EXAM: PORTABLE CHEST 1 VIEW COMPARISON:  Chest radiograph December 28, 2015 at 0916 hours FINDINGS: Endotracheal tube tip projects 4.8 cm above the carina. Nasogastric tube past proximal stomach, not imaged. Nasogastric tube side port projects in proximal stomach. Stable appearance of LEFT subclavian central venous catheter with distal tip projecting in mid superior vena cava. Similar moderate RIGHT pleural effusion. Bibasilar strandy densities. Biapical pleural capping. No pneumothorax. Soft tissue planes and included osseous structures are nonsuspicious. IMPRESSION: Endotracheal tube tip projects 4.8 cm above the carina. Nasogastric tube past proximal stomach. No change in LEFT subclavian central venous catheter. Moderate RIGHT pleural effusion with bibasilar atelectasis. Electronically Signed   By: Elon Alas M.D.   On: 12/28/2015 19:38   Dg Chest Port 1 View  12/28/2015  CLINICAL DATA:  Hypoxia.  Recent diagnosis of pulmonary embolism. EXAM: PORTABLE CHEST 1 VIEW COMPARISON:  Chest CT 12/27/2015. FINDINGS: Central venous catheter tip projects over the superior vena cava. Monitoring leads project over the patient. Stable cardiac and mediastinal contours. Moderate layering bilateral pleural effusions and underlying opacities within the mid lower lungs bilaterally. No pneumothorax. IMPRESSION: Moderate layering bilateral pleural effusions with underlying opacities which may represent  atelectasis. Electronically Signed   By: Lovey Newcomer M.D.   On: 12/28/2015 10:53   Ct Angio Abd/pel W/ And/or W/o  12/28/2015  CLINICAL DATA:  75 year old female with left hip arthroplasty and postoperative hemorrhage. Discovery of rate atrial thrombus, with respiratory compromise. Status post initiation of catheter directed thrombolysis of atrial thrombus via right common femoral vein approach. EXAM: CT ANGIOGRAPHY ABDOMEN AND PELVIS TECHNIQUE: Multidetector CT imaging through the abdomen and pelvis was performed using the standard protocol during bolus administration of intravenous contrast. Multiplanar reconstructed images including MIPs were obtained and reviewed to evaluate the vascular anatomy. CONTRAST:  100 cc Isovue 370 COMPARISON:  CT chest 12/27/2015, CT pelvis 12/26/2015 FINDINGS: LOWER CHEST: Nonvascular: Unremarkable appearance of the superficial soft tissues the chest. Heart size within normal limits. Tip of left subclavian central venous catheter partially visualized terminating in the superior vena cava. Tip of the lower extremity lytic catheter terminates in the right atrium. Questionable hypoperfusion of the myocardium. Small bilateral pleural effusions with associated atelectasis. Gastric tube continues through the esophagus terminating in the stomach. Aeration of the bilateral lower lobes improved compared to the prior CT, status post intubation with positive pressure ventilation. Endotracheal tube terminates above the carina. Vascular: Normal course caliber and contour of the thoracic aorta. Minimal atherosclerotic changes. No dissection or aneurysm. No ulceration. Timing is not optimized for the evaluation of the pulmonary arteries, however, there is no central, lobar filling defects. There are a few visualized segmental/subsegmental filling defects in the distribution of the comparison CT, with improved thrombus burden of the bilateral lower lobes. There is no large filling defect  identified within the right atrium on the CT. ABDOMEN PELVIS: Nonvascular: Intermediate density fluid layered in the bilateral subdiaphragmatic space tracking along the bilateral pericolic gutter. Mixed density fluid in the pelvis, predominantly extraperitoneal (within the space of Retzius. Overall volume appears decreased from the comparison CT of the pelvis performed 12/26/2015 largest component on today's study measures approximately 16.3 cm by 11 cm, compared to 15.5 cm x 15 cm. Unremarkable appearance of liver, spleen. Unremarkable appearance of  the pancreas. Unremarkable appearance of the bilateral adrenal glands. No evidence of hydronephrosis. Unremarkable course of the bilateral ureters, which are partially opacified with contrast. High density material within the gallbladder, compatible with vicarious excretion of contrast. Urinary bladder decompressed with catheter in position. Vascular: Normal course caliber and contour of the abdominal aorta. Scattered calcifications present. No aneurysm or dissection flap. No periaortic fluid. Celiac artery and superior mesenteric artery are patent. Bilateral renal arteries patent with no significant stenosis. Inferior mesenteric artery patent. Right lower extremity: Normal course caliber and contour of the right external iliac artery without aneurysm or dissection flap. No active extravasation or evidence of arterial injury. Right hypogastric arteries patent including anterior and posterior branches. Proximal right femoral arteries patent. Left lower extremity: Normal course caliber and contour of the left common iliac artery. No aneurysm or dissection. No occlusion. Hypogastric arteries patent including anterior posterior branches. No evidence of active extravasation or contrast pooling involving the hypogastric territory. External iliac artery is patent. Vascular stent in place at the site of previously identified injury. Stent terminates at the level of the inguinal  ligament. Proximal left femoral arteries unremarkable with no active extravasation. There is enlarging hematoma of the proximal left thigh compared to the prior pelvis CT. Hematoma measures approximately 10.5 cm in greatest diameter, increased from 9.3 cm on the prior. Hematoma centered within the anterior and lateral rectus musculature. There is a tiny linear hyperdensity centered within the hematoma on the lateral aspect, potentially site of hemorrhage. No significant pooling on the post contrast images. Unremarkable appearance of the venous system. Venous catheter present within the right femoral system, traversing the IVC. The left external iliac and common iliac vein are compressed by a pelvic hematoma. Musculoskeletal: Surgical changes of left hip arthroplasty. Mild degenerative changes of the spine. No displaced fracture is identified. Review of the MIP images confirms the above findings. IMPRESSION: No evidence of new intraperitoneal or pelvic hemorrhage to account for new back pain. Re- demonstration of extraperitoneal and intraperitoneal hemorrhage, with decreasing size of the largest component in the space of Retzius and within the extraperitoneal pelvis. There is increasing hematoma in the left proximal thigh, with dense focus centered within the musculature which may indicate venous bleeding or small arterial bleeding. This detail was discussed with the managing physicians at the time of the CT scan. Patent left external iliac stent, status post embolization. Questionable hypodensity of the left ventricle myocardium, potentially indicating hypoperfusion. Compared to the prior CT of the chest, there is decreasing appreciable volume of pulmonary embolus in the bilateral lower lobes. No large right atrial filling defect identified on the delayed images. Small bilateral pleural effusions with associated atelectasis. Signed, Dulcy Fanny. Earleen Newport, DO Vascular and Interventional Radiology Specialists [facility]  Radiology Electronically Signed   By: Corrie Mckusick D.O.   On: 12/28/2015 21:10   Ir Infusion Thrombol Venous Initial (ms)  12/28/2015  INDICATION: 75 year old female, status post postoperative hemorrhage left external iliac artery branch vessel, with embolization for hemostasis. Subsequent development of respiratory decompensation with cardiac echo demonstrating giant atrial thrombus. Surgical embolectomy carries a high morbidity/ mortality given the patient's current ICU status. Alternative treatment options include catheter embolectomy/thrombectomy, as well as catheter directed thrombolysis. Family has elected to proceed with least invasive therapy. EXAM: IR INFUSION THROMBOL ARTERIAL INITIAL (MS); IR ULTRASOUND GUIDANCE VASC ACCESS RIGHT; INFERIOR VENA CAVOGRAM COMPARISON:  CT pelvis 12/26/2015 CT chest 12/27/2015 Cardiac ECHO 12/27/2015 and 12/28/2015 MEDICATIONS: None. ANESTHESIA/SEDATION: Versed 0.5 mg IV; Fentanyl 0 mcg IV  No moderate sedation The patient was continuously monitored during the procedure by the interventional radiology nurse under my direct supervision. FLUOROSCOPY TIME:  Fluoroscopy Time: 0 minutes 36 seconds (58 mGy). COMPLICATIONS: None TECHNIQUE: Informed written consent was obtained from the patient and the patient's husband after a thorough discussion of the procedural risks, benefits and alternatives. All questions were addressed. Maximal Sterile Barrier Technique was utilized including caps, mask, sterile gowns, sterile gloves, sterile drape, hand hygiene and skin antiseptic. A timeout was performed prior to the initiation of the procedure. Patient positioned supine position on the fluoroscopy table. The right inguinal region was prepped and draped in the usual sterile fashion. Ultrasound survey of the right inguinal region was performed with images stored and sent to PACs. A single wall 19 gauge needle was used access the right common femoral vein under ultrasound. With excellent  venous blood flow returned, an 035 [place] wire was passed through the needle, observed enter the abdominal IVC under fluoroscopy. The needle was removed, and a standard 5 [place] vascular sheath was placed. The dilator was removed and the sheath was flushed. Limited venogram was then performed of the right iliac vein and the IVC. A stiff Glidewire was then advanced to the inferior cavoatrial junction. A 20 cm infusion length Uni fuse catheter was then placed at the suprarenal IVC, with the tip terminating in the inferior right atrium. Final image was stored. Patient tolerated the procedure well and remained hemodynamically unchanged. No blood loss. FINDINGS: Ultrasound survey demonstrates patent right common femoral vein. Limited venogram of the right iliac vein and IVC demonstrates no thrombus of the visualized right iliac system. Venous reflux into the left iliac system with slow flow through the IVC. No occlusive thrombus of the infrarenal or suprarenal IVC. There is poor definition of the known right atrial thrombus given the low volume injection. IMPRESSION: Status post right common femoral vein access for placement of thrombolysis catheter and initiation of pharmacologic thrombolysis to treat known right atrial thrombus. Signed, Dulcy Fanny. Earleen Newport, DO [facility] Radiology Electronically Signed   By: Corrie Mckusick D.O.   On: 12/28/2015 16:56    Labs:  CBC:  Recent Labs  12/29/15 1034 12/29/15 1645 12/30/15 0201 12/31/15 0400  WBC 15.9* 17.5* 15.6* 18.8*  HGB 8.0* 7.4* 7.0* 5.8*  HCT 23.4* 21.7* 20.6* 18.2*  PLT 55* 60* 76* 120*    COAGS:  Recent Labs  12/25/15 2013 12/26/15 1740 12/27/15 12/27/15 0400  INR 1.13 2.01* 1.35 1.37  APTT  --  29  --   --     BMP:  Recent Labs  12/28/15 0038 12/29/15 0306 12/30/15 0201 12/31/15 0400  NA 140 143 144 145  K 3.8 3.3* 4.0 4.1  CL 110 116* 120* 120*  CO2 23 20* 22 21*  GLUCOSE 133*  134* 144* 122*  BUN 15 20 18  21*  CALCIUM 8.2* 7.6* 7.4* 7.3*  CREATININE 0.84 0.88 0.70 0.74  GFRNONAA >60 >60 >60 >60  GFRAA >60 >60 >60 >60    LIVER FUNCTION TESTS:  Recent Labs  12/27/15 12/27/15 0400 12/29/15 0306 12/31/15 0400  BILITOT 1.2 1.1 0.6 1.1  AST 51* 45* 52* 60*  ALT 28 25 21 24   ALKPHOS 66 59 48 77  PROT 6.0* 5.6* 4.7* 4.3*  ALBUMIN 3.5 3.1* 2.5* 2.1*    Assessment and Plan: 1. S/p embolization of pseudoaneurysm with subsequent thrombolysis secondary to atrial thrombus. -we will plan for placement of an IVC filter  today as her hgb continues to fall.  It is 5.8 today and she no longer can be on heparin. -discussed with the patient's husband and family as she is on the vent.  They are agreeable -Risks and Benefits discussed with the patient's husband including, but not limited to bleeding, infection, contrast induced renal failure, filter fracture or migration which can lead to emergency surgery or even death, strut penetration with damage or irritation to adjacent structures and caval thrombosis. All of the patient's questions were answered, patient is agreeable to proceed. Consent signed and in chart.    Electronically Signed: Henreitta Cea 12/31/2015, 9:57 AM   I spent a total of 25 Minutes at the the patient's bedside AND on the patient's hospital floor or unit, greater than 50% of which was counseling/coordinating care for atrial thrombus

## 2015-12-31 NOTE — Progress Notes (Signed)
PULMONARY / CRITICAL CARE MEDICINE   Name: Bonnie Salazar MRN: [id number] DOB: [birth year]    ADMISSION DATE:  12/25/2015 CONSULTATION DATE:  12/26/15  REFERRING MD:  Trauma service  CHIEF COMPLAINT:  Hip fracture; need for mechanical ventilation post-operatively  HISTORY OF PRESENT ILLNESS:   Bonnie Salazar is a 26F who presented 12/25/15 after an fall at home with resultant left femoral neck fracture. She was taken to the OR for operative repair 6/16. The initial repair appeared uneventful and she was extubated in the PACU. Shortly thereafter, she complained of abdominal pain and was noted to have a tense abdomen. CT scan showed extravasation into the LLQ; she was taken to IR and underwent covered stent of the distal external iliac artery to the inguinal ligament. Vascular surgery was also consulted. No signs of distal ischemia on his exam. There is some concern for abdominal compartment syndrome, as well. MTP was initiated and she received 11u pRBC, 7FFP, 2 PLT pheresis, 2 cryoprecipitate.  Returned to ICU on mechanical ventilation.  She suffered a PEA arrest 6/18.    SUBJECTIVE:    Hg drop to 5.8, transfused.  Arousable.  VITAL SIGNS: BP 129/89 mmHg  Pulse 97  Temp(Src) 98.4 F (36.9 C) (Oral)  Resp 26  Ht 5\' 7"  (1.702 m)  Wt 87.4 kg (192 lb 10.9 oz)  BMI 30.17 kg/m2  SpO2 96%  HEMODYNAMICS: CVP:  [20 mmHg] 20 mmHg  VENTILATOR SETTINGS: Vent Mode:  [-] PRVC FiO2 (%):  [40 %] 40 % Set Rate:  [26 bmp] 26 bmp Vt Set:  [500 mL] 500 mL PEEP:  [8 cmH20] 8 cmH20 Plateau Pressure:  [20 cmH20-27 cmH20] 27 cmH20  INTAKE / OUTPUT:  Intake/Output Summary (Last 24 hours) at 12/31/15 0809 Last data filed at 12/31/15 0751  Gross per 24 hour  Intake 5781.76 ml  Output    945 ml  Net 4836.76 ml   PHYSICAL EXAMINATION:  General Acutely ill appearing female, intubated and ventilated.   HEENT [place]/AT, PERRLA, EOM-I and MMM.  Pulmonary ETT in place, coarse BS diffusely.   Cardiovascular Regular; II/VI diastolic heart murmur.  Distal pulses palpable.  Abdomen Distended, hypoactive bowel sounds.  Musculoskeletal L hip post-surgical changes / dressing in place  Lymphatics No cervical, supraclavicular or axillary adenopathy.   Neurologic Grossly intact. No focal deficits. Follows commands x 4  Skin/Integuement No rash, no cyanosis, no clubbing. Pale. L [place] CVC in place. LLE edematous / generalized anasarca   LABS:  BMET  Recent Labs Lab 12/29/15 0306 12/30/15 0201 12/31/15 0400  NA 143 144 145  K 3.3* 4.0 4.1  CL 116* 120* 120*  CO2 20* 22 21*  BUN 20 18 21*  CREATININE 0.88 0.70 0.74  GLUCOSE 134* 144* 122*   Electrolytes  Recent Labs Lab 12/29/15 0306 12/30/15 0201 12/30/15 1528 12/31/15 0400  CALCIUM 7.6* 7.4*  --  7.3*  MG  --  1.8  --   --   PHOS  --  <1.0* 1.7* 1.2*   CBC  Recent Labs Lab 12/29/15 1645 12/30/15 0201 12/31/15 0400  WBC 17.5* 15.6* 18.8*  HGB 7.4* 7.0* 5.8*  HCT 21.7* 20.6* 18.2*  PLT 60* 76* 120*   Coag's  Recent Labs Lab 12/26/15 1740 12/27/15 12/27/15 0400  APTT 29  --   --   INR 2.01* 1.35 1.37   Sepsis Markers  Recent Labs Lab 12/27/15 0040  LATICACIDVEN 2.4*   ABG  Recent Labs Lab 12/28/15 1918 12/28/15 2127 12/30/15  0355  PHART 7.168* 7.371 7.292*  PCO2ART 46.7* 32.9* 43.5  PO2ART 281.0* 73.0* 212.0*   Liver Enzymes  Recent Labs Lab 12/27/15 0400 12/29/15 0306 12/31/15 0400  AST 45* 52* 60*  ALT 25 21 24   ALKPHOS 59 48 77  BILITOT 1.1 0.6 1.1  ALBUMIN 3.1* 2.5* 2.1*   Cardiac Enzymes  Recent Labs Lab 12/27/15 0400 12/27/15 1037 12/27/15 1526  TROPONINI 1.20* 2.00* 2.06*   Glucose  Recent Labs Lab 12/30/15 0824 12/30/15 1138 12/30/15 1513 12/30/15 2025 12/31/15 0014 12/31/15 0407  GLUCAP 153* 157* 143* 157* 209* 123*   Imaging Dg Chest Port 1 View  12/31/2015  CLINICAL DATA:  Acute respiratory failure. EXAM: PORTABLE CHEST 1 VIEW COMPARISON:   12/30/2015. FINDINGS: Endotracheal tube and NG tube in stable position. Left subclavian line stable position. Stable cardiomegaly. Diffuse bilateral pulmonary infiltrates/ edema with slight interim improvement. Persistent bilateral effusions noted also with slight improvement. No pneumothorax . IMPRESSION: 1. Lines and tubes stable position. 2. Cardiomegaly with diffuse bilateral pulmonary infiltrates consistent with pulmonary edema. Slight interim improvement. Bilateral pleural effusions. Slight interim improvement . Electronically Signed   By: Marcello Moores  Register   On: 12/31/2015 06:57  PCXR: bilateral pulmonary edema ETT good position. NGT was in mid esophagus.   STUDIES:  ECHO 6/17 >> Left ventricle: The cavity size was normal. Systolic function was normal. The estimated ejection fraction was in the range of 50% to 55%. Wall motion was normal; there were no regional wall motion abnormalities.- Mitral valve: Moderate prolapse. There was severe regurgitation. - Left atrium: The atrium was mildly dilated.- Right ventricle: The cavity size was mildly dilated. Wall thickness was normal.- Right atrium: There is a large mobile mass in the right atrium  that prolapses toward the mitral valve. CT chest 6/17 >> Bilateral pulmonary emboli, nearly occlusive in the left lower lobe segmental branches, involving all of the segmental pulmonary arteries. Evidence of right heart strain with right/left heart ratio of over 1. Heterogeneous area of hypoattenuation within the right atrium may represent right atrial clot versus mixing of non-opacified blood from the inferior vena cava. Bilateral pleural effusions and atelectatic changes of bilateral lung bases. LE [place] 6/17 >> negative for DVT ECHO 6/18 >> LVEF 55-60%, normal wall motion, moderate MVP, mod to severe MR, RA mildly dilated, RA with large moblie pedunculated and irregular mass that prolapses through the TV c/w thrombus CT ABD/Pelvis 6/18 >> no evidence of new  intraperitoneal or pelvic hemorrhage, increasing hematoma in left proximal thigh, decreasing volume of pulmonary embolus in the bilateral lower lobes CT Head 6/18 >> No CT evidence of acute intracranial abnormality ECHO 6/19 >>   CULTURES: None  ANTIBIOTICS: Perioperative Ancef 6/20 Zosyn >>> 6/21 Vancomycin >>>  SIGNIFICANT EVENTS: 6/17  Admit after falle with L hip fx,  L HIP replacement, extubated, developed abd pain found to have intraabdominal hematoma.   6/17  AF w/ CE elevation so ECHO checked-->round RA thrombus. Started on heparin. Cards, CVTS and IR involved in care. Later that evening increased resp distress. FIO2 req increased. CT chest w/ bilateral PE 6/18  remaining hypoxic. Hgb stable, repeat ECHO obtained and consideration for EKOS.  PEA Arrest 6/20  On neo 20-80, sedation, heparin.  No weaning.  Anasarca 6/21 drop in HgB, 2 units of PRBC, ordered IVC filter, Heparin gtt stopped  LINES/TUBES: Foley 6/16  PIV ETT 6/16-->6/17 >> 6/18 >> L [place] DLC 6/16 >>  DISCUSSION: Ms. Hsieh is a 75F who remains intubated after  a complicated L hip replacement with arterial avulsion of the left external iliac artery requiring covered stent placement by IR c/b intraabdominal / pelvic hematoma, hgb has been stable. Course complicated by increased CEs, AF, ECHO w/ RA thrombus, later c/b PE, and hypoxic resp failure. She was placed on heparin and multiple specialties have been involved.  6/18 am RV fxn worse and there is still large clot in the RA. 6/18  underwent catheter directed lysis per IR.  She later developed a drop in blood pressure while on TPA and it was discontinued.  Case was discussed with VVS and she was deemed not a surgical candidate.  The patient was stabilized and went for repeat CT of abd/pelvis which showed increased left thigh hematoma, no increase in pelvic hemorrhage.   TPA stopped and heparin gtt initiated. 6/21 Drop in HgB, Heparin gtt stopped, IVC filter ordered    ASSESSMENT / PLAN:  PULMONARY A: Acute Hypoxic Respiratory Failure  Mild Pulmonary edema  Acute bilateral PE-->RV strain has increased but not sure that this is resp for her resp failure   P:   PRVC 8 cc/kg, rate 26 ABG now. Titrate O2 for sats >94%. Heparin stopped 2/2 decrease in HgB VAP bundle CXR slightly improved, repeat in AM  CARDIOVASCULAR A:  Troponin elevation -->likely demand ischemia  Heart murmur Intermittent AF Severe MR RA clott;  Now w/ acute PE (likely migrated)-->appreciate cards, thoracic and interventional assist.  Repeat echo 6/19 without a clot. PEA Arrest - 6/18, approx 10 minutes Shock  P:  Neo to maintain MAP >65. Follow CVP Heparin gtt stopped 2/2 decreased in HgB Cortisol borderline 18.3 Evaluate need for pressors once HgB is stable Consider steroids if continued pressor requirement  RENAL A:   Metabolic Acidosis Hypophosphatemia  P:   BMET in AM. Replace electrolytes as indicated. + 4.7 liters for last 24 hours Albumin q6. Lasix 10 mg/hr IV drip. Zaroxolyn 10 mg PO x1.  GASTROINTESTINAL A:   Concern for abdominal compartment syndrome--> no evidence of active bleeding  P:   Continue TF Famotidine for ppx Trend bladder pressures Q 4hours  HEMATOLOGIC A:   Arterial hemorrhage s/p stenting and requiring massive transfusion propofol RA thrombus-->likely propagated from right pelvic vein compression from hematoma-->migrated and now with BILATERAL PE P:  Trend CBC Transfuse per ICU protocol Heparin stopped 2/2 decrease in HgB Repeated right sided ECHO no clot seen on 6/19l.  INFECTIOUS A:   No acute issues - leukocytosis likely stress response S/p periop Ancef P:   Tmax 101 for last 24 hours Leukocytosis increased Vanc/Zosyn started  F/U BC and Sputum UA negative  ENDOCRINE A:   No acute issues  P:   Trend glucose on chemistries   NEUROLOGIC A:   Anxiety and post-op pain  P:   RASS goal: -1 Fent drip for  pain PRN versed Add space boots   FAMILY  - Updates:   Family updated bedside.  The patient is critically ill with multiple organ systems failure and requires high complexity decision making for assessment and support, frequent evaluation and titration of therapies, application of advanced monitoring technologies and extensive interpretation of multiple databases.   Critical Care Time devoted to patient care services described in this note is  35  Minutes. This time reflects time of care of this signee Dr [name]. This critical care time does not reflect procedure time, or teaching time or supervisory time of PA/NP/Med student/Med Resident etc but could involve care discussion time.  Rush Farmer, M.D. [facility] Pulmonary/Critical Care Medicine. Pager: [phone]. After hours pager: [phone].

## 2015-12-31 NOTE — Procedures (Signed)
IVC filter No comp/EBL 

## 2015-12-31 NOTE — Progress Notes (Signed)
[place] Progress Note Patient Name: Bonnie Salazar DOB: 22-Sep-1940 MRN: [id number]   Date of Service  12/31/2015  HPI/Events of Note  Hb 5.8.  eICU Interventions  Will transfuse 2 units PRBC.     Intervention Category Major Interventions: Other:  Ethelle Ola 12/31/2015, 4:51 AM

## 2015-12-31 NOTE — Progress Notes (Signed)
Interventional Radiology Progress Note   HPI: 75 yo female admitted Friday June 16 for left hip arthroplasty, post-op bleed, SP repair of left external iliac artery.  Subsequent development of giant right atrial thrombus and PE, with heparin initiated.    Sunday June 18 patient had code, with CT evidence of hemorrhage into surgical site while on thrombolytic therapy for giant right atrial thrombus.  tPA stopped after 3-4 hours on Sunday night.  Repeat ECHO is reported negative for residual atrial thrombus.   Interval: Patient has been continued on IV heparin, with downward trend in hemoglobin.  This am is 5.8.  Currently receiving 2 UPRBC's.  Remains intubated and on pressor support.  Edema bilateral lower extremities.  Doppler signal positive at distal left pulses and extremities warm.  Abdomen remains distended and tense.    Will discuss with CCM regarding heparin therapy and possible need for IVC filter.   Agree with current care.   Call with questions/concerns.  [phone]  Signed,  Dulcy Fanny Earleen Newport, DO

## 2015-12-31 NOTE — Progress Notes (Signed)
Assessment/Plan: 3 Days Post-Op Procedure(s) (LRB): TOTAL HIP ARTHROPLASTY ANTERIOR APPROACH (Left)   Principal Problem:   Fracture of femoral neck, left (HCC) Active Problems:   Hip fracture (HCC)   Leukocytosis   Dehydration   Atrial thrombus (HCC)   Acute pulmonary embolism (HCC)   Acute respiratory failure (HCC)   Hemorrhagic shock   Cardiac arrest (HCC)   Left leg is stable w/ diffuse b/l edema, compartments soft.  Extremities warm, +pulses.  Cardiopulmonary Status is most critical at this time.  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   Subsequent Right atrial thrombus. -Catheter thrombolysis w/ tPA for right atrial thrombus was started and stopped w/ onset of new pain and required CPR/resuscitation on 6/18. -Echo from 12/29/15 - Previous right atrial mass is no longer demonstrated.  Hgb Down to 5.8 A.M. 6/21.  2 units PRBCs ordered.  Heparin held for likely IVC filter placement.  Greatly appreciate consultants and their management.  Subjective: Sedated on Ventilator.  Objective: Vital signs in last 24 hours: Temp:  [98.5 F (36.9 C)-101 F (38.3 C)] 98.9 F (37.2 C) (06/21 0745) Pulse Rate:  [79-126] 89 (06/21 0751) Resp:  [12-26] 26 (06/21 0751) BP: (90-136)/(55-94) 107/66 mmHg (06/21 0700) SpO2:  [79 %-100 %] 96 % (06/21 0751) Arterial Line BP: (88-164)/(41-80) 138/63 mmHg (06/21 0751) FiO2 (%):  [40 %] 40 % (06/21 0400) Weight:  [87.4 kg (192 lb 10.9 oz)] 87.4 kg (192 lb 10.9 oz) (06/21 0430)  Intake/Output from previous day: 06/20 0701 - 06/21 0700 In: 5747.3 [I.V.:3517.3; Blood:30; NG/GT:1325; IV Piggyback:875] Out: 985 [Urine:985] Intake/Output this shift: Total I/O In: 312 [I.V.:30; Blood:282] Out: -    Recent Labs  12/29/15 0306 12/29/15 1034 12/29/15 1645 12/30/15 0201 12/31/15 0400  HGB 9.0* 8.0* 7.4* 7.0* 5.8*    Recent Labs  12/30/15 0201 12/31/15 0400  WBC 15.6* 18.8*  RBC 2.29* 1.95*  HCT 20.6* 18.2*   PLT 76* 120*    Recent Labs  12/30/15 0201 12/31/15 0400  NA 144 145  K 4.0 4.1  CL 120* 120*  CO2 22 21*  BUN 18 21*  CREATININE 0.70 0.74  GLUCOSE 144* 122*  CALCIUM 7.4* 7.3*   Physical Exam General: Sedated, on vent.  Anasarca.  Abdomen distended, tense. Musculoskeletal: Left leg / foot edematous and warm, + pulses.   Dressing: c/d/i  Bonnie Salazar 12/31/2015, 7:57 AM

## 2015-12-31 NOTE — Progress Notes (Signed)
Patient ID: Bonnie Salazar, Bonnie Salazar   DOB: July 31, 1940, 75 y.o.   MRN: [id number] Unresponsive on vent. Remains hemodynamically stable Vena cava filter placement in IR today. Good perfusion to both lower extremities. No change in her marked edema. Will not follow actively. Please call for vascular concerns

## 2016-01-01 ENCOUNTER — Inpatient Hospital Stay (HOSPITAL_COMMUNITY): Payer: Medicare HMO

## 2016-01-01 LAB — BLOOD GAS, ARTERIAL
Acid-Base Excess: 3.1 mmol/L — ABNORMAL HIGH (ref 0.0–2.0)
Acid-Base Excess: 6.1 mmol/L — ABNORMAL HIGH (ref 0.0–2.0)
BICARBONATE: 30.8 meq/L — AB (ref 20.0–24.0)
Bicarbonate: 26.8 mEq/L — ABNORMAL HIGH (ref 20.0–24.0)
DRAWN BY: 290171
Drawn by: 331761
FIO2: 0.6
FIO2: 0.4
LHR: 18 {breaths}/min
MECHVT: 500 mL
MECHVT: 500 mL
O2 SAT: 99.2 %
O2 Saturation: 96.9 %
PATIENT TEMPERATURE: 98.6
PCO2 ART: 38.3 mmHg (ref 35.0–45.0)
PEEP/CPAP: 5 cmH2O
PEEP: 8 cmH2O
PH ART: 7.459 — AB (ref 7.350–7.450)
PO2 ART: 86.6 mmHg (ref 80.0–100.0)
Patient temperature: 98.6
RATE: 26 resp/min
TCO2: 28 mmol/L (ref 0–100)
TCO2: 32.3 mmol/L (ref 0–100)
pCO2 arterial: 50.5 mmHg — ABNORMAL HIGH (ref 35.0–45.0)
pH, Arterial: 7.402 (ref 7.350–7.450)
pO2, Arterial: 136 mmHg — ABNORMAL HIGH (ref 80.0–100.0)

## 2016-01-01 LAB — BASIC METABOLIC PANEL
Anion gap: 8 (ref 5–15)
Anion gap: 11 (ref 5–15)
BUN: 25 mg/dL — AB (ref 6–20)
BUN: 29 mg/dL — AB (ref 6–20)
CHLORIDE: 107 mmol/L (ref 101–111)
CHLORIDE: 95 mmol/L — AB (ref 101–111)
CO2: 27 mmol/L (ref 22–32)
CO2: 33 mmol/L — AB (ref 22–32)
CREATININE: 1.01 mg/dL — AB (ref 0.44–1.00)
Calcium: 8.4 mg/dL — ABNORMAL LOW (ref 8.9–10.3)
Calcium: 9 mg/dL (ref 8.9–10.3)
Creatinine, Ser: 0.9 mg/dL (ref 0.44–1.00)
GFR calc Af Amer: >60 mL/min (ref >60)
GFR calc Af Amer: >60 mL/min (ref >60)
GFR calc non Af Amer: >60 mL/min (ref >60)
GFR calc non Af Amer: 53 mL/min — ABNORMAL LOW (ref >60)
GLUCOSE: 180 mg/dL — AB (ref 65–99)
Glucose, Bld: 149 mg/dL — ABNORMAL HIGH (ref 65–99)
Potassium: 2.8 mmol/L — ABNORMAL LOW (ref 3.5–5.1)
Potassium: 2.7 mmol/L — CL (ref 3.5–5.1)
SODIUM: 142 mmol/L (ref 135–145)
SODIUM: 139 mmol/L (ref 135–145)

## 2016-01-01 LAB — TYPE AND SCREEN
ABO/RH(D): B POS
Antibody Screen: NEGATIVE
UNIT DIVISION: 0
UNIT DIVISION: 0

## 2016-01-01 LAB — CBC
HEMATOCRIT: 24.1 % — AB (ref 36.0–46.0)
HEMOGLOBIN: 7.9 g/dL — AB (ref 12.0–15.0)
MCH: 29.2 pg (ref 26.0–34.0)
MCHC: 32.8 g/dL (ref 30.0–36.0)
MCV: 88.9 fL (ref 78.0–100.0)
Platelets: 91 10*3/uL — ABNORMAL LOW (ref 150–400)
RBC: 2.71 MIL/uL — ABNORMAL LOW (ref 3.87–5.11)
RDW: 17.2 % — AB (ref 11.5–15.5)
WBC: 14.1 10*3/uL — ABNORMAL HIGH (ref 4.0–10.5)

## 2016-01-01 LAB — RENAL FUNCTION PANEL
ANION GAP: 9 (ref 5–15)
Albumin: 3.4 g/dL — ABNORMAL LOW (ref 3.5–5.0)
BUN: 25 mg/dL — AB (ref 6–20)
CO2: 27 mmol/L (ref 22–32)
Calcium: 8.4 mg/dL — ABNORMAL LOW (ref 8.9–10.3)
Chloride: 106 mmol/L (ref 101–111)
Creatinine, Ser: 0.91 mg/dL (ref 0.44–1.00)
GFR calc Af Amer: >60 mL/min (ref >60)
GFR calc non Af Amer: >60 mL/min (ref >60)
GLUCOSE: 146 mg/dL — AB (ref 65–99)
POTASSIUM: 2.7 mmol/L — AB (ref 3.5–5.1)
Phosphorus: 2.1 mg/dL — ABNORMAL LOW (ref 2.5–4.6)
Sodium: 142 mmol/L (ref 135–145)

## 2016-01-01 LAB — GLUCOSE, CAPILLARY
GLUCOSE-CAPILLARY: 174 mg/dL — AB (ref 65–99)
Glucose-Capillary: 144 mg/dL — ABNORMAL HIGH (ref 65–99)
Glucose-Capillary: 146 mg/dL — ABNORMAL HIGH (ref 65–99)
Glucose-Capillary: 149 mg/dL — ABNORMAL HIGH (ref 65–99)
Glucose-Capillary: 180 mg/dL — ABNORMAL HIGH (ref 65–99)
Glucose-Capillary: 180 mg/dL — ABNORMAL HIGH (ref 65–99)

## 2016-01-01 LAB — PROCALCITONIN: PROCALCITONIN: 0.59 ng/mL

## 2016-01-01 LAB — MAGNESIUM
Magnesium: 1.7 mg/dL (ref 1.7–2.4)
Magnesium: 2.1 mg/dL (ref 1.7–2.4)

## 2016-01-01 LAB — CALCIUM, IONIZED: CALCIUM, IONIZED, SERUM: 4.5 mg/dL (ref 4.5–5.6)

## 2016-01-01 LAB — PHOSPHORUS: PHOSPHORUS: 2 mg/dL — AB (ref 2.5–4.6)

## 2016-01-01 MED ORDER — POTASSIUM CHLORIDE 20 MEQ/15ML (10%) PO SOLN
40.0000 meq | Freq: Once | ORAL | Status: AC
  Administered 2016-01-01: 40 meq
  Filled 2016-01-01: qty 30

## 2016-01-01 MED ORDER — HYDRALAZINE HCL 20 MG/ML IJ SOLN
INTRAMUSCULAR | Status: AC
  Administered 2016-01-01: 10 mg
  Filled 2016-01-01: qty 1

## 2016-01-01 MED ORDER — POTASSIUM CHLORIDE 20 MEQ/15ML (10%) PO SOLN
20.0000 meq | Freq: Once | ORAL | Status: AC
  Administered 2016-01-02: 20 meq
  Filled 2016-01-01: qty 15

## 2016-01-01 MED ORDER — METOLAZONE 5 MG PO TABS
5.0000 mg | ORAL_TABLET | Freq: Once | ORAL | Status: AC
  Administered 2016-01-01: 5 mg via ORAL
  Filled 2016-01-01: qty 1

## 2016-01-01 MED ORDER — MAGNESIUM SULFATE 2 GM/50ML IV SOLN
2.0000 g | Freq: Once | INTRAVENOUS | Status: AC
  Administered 2016-01-01: 2 g via INTRAVENOUS
  Filled 2016-01-01: qty 50

## 2016-01-01 MED ORDER — SODIUM CHLORIDE 0.9 % IV SOLN
INTRAVENOUS | Status: DC
  Administered 2016-01-05: 10 mL/h via INTRAVENOUS

## 2016-01-01 MED ORDER — ALBUMIN HUMAN 25 % IV SOLN
25.0000 g | Freq: Four times a day (QID) | INTRAVENOUS | Status: AC
  Administered 2016-01-01 – 2016-01-02 (×4): 25 g via INTRAVENOUS
  Filled 2016-01-01: qty 100
  Filled 2016-01-01: qty 50
  Filled 2016-01-01 (×2): qty 100
  Filled 2016-01-01: qty 50

## 2016-01-01 MED ORDER — HYDRALAZINE HCL 20 MG/ML IJ SOLN: 10.0000 mg | INTRAMUSCULAR | Status: DC | PRN

## 2016-01-01 MED ORDER — POTASSIUM CHLORIDE 20 MEQ/15ML (10%) PO SOLN
40.0000 meq | ORAL | Status: AC
  Administered 2016-01-01 (×2): 40 meq
  Filled 2016-01-01 (×2): qty 30

## 2016-01-01 MED ORDER — SODIUM PHOSPHATES 45 MMOLE/15ML IV SOLN
10.0000 mmol | Freq: Once | INTRAVENOUS | Status: AC
  Administered 2016-01-01: 10 mmol via INTRAVENOUS
  Filled 2016-01-01: qty 3.33

## 2016-01-01 NOTE — Progress Notes (Signed)
[place] Progress Note Patient Name: Bonnie Salazar DOB: [birth year] MRN: [id number]   Date of Service  01/01/2016  HPI/Events of Note  Multiple issues: 1. Frequent PVC's and 2. HTN - BP = 178/65.  eICU Interventions  Will order: 1. BMP and Mg++ level now. 2. Hydralazine 10 mg IV Q 4 hours PRN SBP > 160.     Intervention Category Intermediate Interventions: Arrhythmia - evaluation and management;Hypertension - evaluation and management  Lysle Dingwall 01/01/2016, 8:45 PM

## 2016-01-01 NOTE — Progress Notes (Signed)
PULMONARY / CRITICAL CARE MEDICINE   Name: Bonnie Salazar MRN: [id number] DOB: 03-May-1941    ADMISSION DATE:  12/25/2015 CONSULTATION DATE:  12/26/15  REFERRING MD:  Trauma service  CHIEF COMPLAINT:  Hip fracture; need for mechanical ventilation post-operatively  HISTORY OF PRESENT ILLNESS:  Bonnie Salazar is a 4F who presented 12/25/15 after an fall at home with resultant left femoral neck fracture. She was taken to the OR for operative repair 6/16. The initial repair appeared uneventful and she was extubated in the PACU. Shortly thereafter, she complained of abdominal pain and was noted to have a tense abdomen. CT scan showed extravasation into the LLQ; she was taken to IR and underwent covered stent of the distal external iliac artery to the inguinal ligament. Vascular surgery was also consulted. No signs of distal ischemia on his exam. There is some concern for abdominal compartment syndrome, as well. MTP was initiated and she received 11u pRBC, 7FFP, 2 PLT pheresis, 2 cryoprecipitate.  Returned to ICU on mechanical ventilation.  She suffered a PEA arrest 6/18.  ICU course complicated by further bleeding.  IVC filter placed.   SUBJECTIVE:  Net negative 4.8L but remains possitive 23+L, no acute events overnight.  Off vasopressors.  VITAL SIGNS: BP 132/119 mmHg  Pulse 115  Temp(Src) 99.5 F (37.5 C) (Oral)  Resp 27  Ht 5\' 7"  (1.702 m)  Wt 189 lb 9.5 oz (86 kg)  BMI 29.69 kg/m2  SpO2 89%  HEMODYNAMICS: CVP:  [10 mmHg-25 mmHg] 15 mmHg  VENTILATOR SETTINGS: Vent Mode:  [-] PRVC FiO2 (%):  [40 %-100 %] 50 % Set Rate:  [26 bmp] 26 bmp Vt Set:  [500 mL] 500 mL PEEP:  [8 cmH20] 8 cmH20 Plateau Pressure:  [22 cmH20-33 cmH20] 22 cmH20  INTAKE / OUTPUT:  Intake/Output Summary (Last 24 hours) at 01/01/16 1017 Last data filed at 01/01/16 1000  Gross per 24 hour  Intake 4000.35 ml  Output  11000 ml  Net -6999.65 ml   PHYSICAL EXAMINATION:  General Acutely ill appearing female,  on vent  HEENT [name]/AT, PERRLA, EOM-I and MMM.  Pulmonary ETT in place, coarse BS diffusely.  Cardiovascular Regular; II/VI diastolic heart murmur.  Distal pulses palpable.  Abdomen Distended, hypoactive bowel sounds.  Musculoskeletal L hip post-surgical changes / dressing in place, mild ecchymosis  Lymphatics No cervical, supraclavicular or axillary adenopathy.   Neurologic Grossly intact. No focal deficits. Follows commands x 4  Skin/Integuement No rash, no cyanosis, no clubbing. Pale. L [place] CVC in place. LLE edematous / generalized anasarca   LABS:  BMET  Recent Labs Lab 12/31/15 0951 01/01/16 0433 01/01/16 0434  NA 144 142 142  K 4.1 2.8* 2.7*  CL 120* 107 106  CO2 21* 27 27  BUN 20 25* 25*  CREATININE 0.70 0.90 0.91  GLUCOSE 143* 149* 146*   Electrolytes  Recent Labs Lab 12/30/15 0201  12/31/15 0400 12/31/15 0951 01/01/16 0433 01/01/16 0434  CALCIUM 7.4*  --  7.3* 7.3* 8.4* 8.4*  MG 1.8  --   --   --  1.7  --   PHOS <1.0*  < > 1.2*  --  2.0* 2.1*  < > = values in this interval not displayed. CBC  Recent Labs Lab 12/31/15 0951 12/31/15 1615 01/01/16 0550  WBC 16.4* 14.3* 14.1*  HGB 8.4* 8.6* 7.9*  HCT 25.6* 26.3* 24.1*  PLT 118* 103* 91*   Coag's  Recent Labs Lab 12/26/15 1740 12/27/15 12/27/15 0400 12/31/15 0951  APTT  29  --   --  33  INR 2.01* 1.35 1.37 1.30   Sepsis Markers  Recent Labs Lab 12/27/15 0040  LATICACIDVEN 2.4*   ABG  Recent Labs Lab 12/30/15 0355 12/31/15 1010 01/01/16 0335  PHART 7.292* 7.290* 7.459*  PCO2ART 43.5 47.1* 38.3  PO2ART 212.0* 73.1* 136*   Liver Enzymes  Recent Labs Lab 12/27/15 0400 12/29/15 0306 12/31/15 0400 01/01/16 0434  AST 45* 52* 60*  --   ALT 25 21 24   --   ALKPHOS 59 48 77  --   BILITOT 1.1 0.6 1.1  --   ALBUMIN 3.1* 2.5* 2.1* 3.4*   Cardiac Enzymes  Recent Labs Lab 12/27/15 0400 12/27/15 1037 12/27/15 1526  TROPONINI 1.20* 2.00* 2.06*   Glucose  Recent Labs Lab  12/31/15 1101 12/31/15 1538 12/31/15 1931 01/01/16 0014 01/01/16 0414 01/01/16 0752  GLUCAP 119* 141* 146* 149* 146* 180*   Imaging Ir Ivc Filter Plmt / S&i /img Guid/mod Sed  12/31/2015  INDICATION: Pulmonary thromboembolism.  Internal hemorrhage. EXAM: IVC FILTER,INFERIOR VENA CAVOGRAM MEDICATIONS: None. ANESTHESIA/SEDATION: Patient was sedated by the Critical Care team FLUOROSCOPY TIME:  Fluoroscopy Time: 1 minutes 12 seconds (53 mGy). COMPLICATIONS: None immediate. PROCEDURE: Informed written consent was obtained from the patient after a thorough discussion of the procedural risks, benefits and alternatives. All questions were addressed. Maximal Sterile Barrier Technique was utilized including caps, mask, sterile gowns, sterile gloves, sterile drape, hand hygiene and skin antiseptic. A timeout was performed prior to the initiation of the procedure. The right neck was prepped with Betadine in a sterile fashion, and a sterile drape was applied covering the operative field. A sterile gown and sterile gloves were used for the procedure. The right jugular vein was noted to be patent initially with ultrasound. Under sonographic guidance, a micropuncture needle was inserted into the right jugular vein (Ultrasound image documentation was performed). It was removed over an 018 wire which was up-sized to a [place]. The sheath was inserted over the wire and into the IVC. IVC venography was performed. The temporary filter was then deployed in the infrarenal IVC. The sheath was removed and hemostasis was achieved with direct pressure. FINDINGS: Venography confirms patency of the IVC without venous anomaly Final image demonstrates placement of an IVC filter with its tip at the L2-3 disc. IMPRESSION: Successful infrarenal IVC filter placement. This is a temporary filter. It can be removed or remain in place to become permanent. Electronically Signed   By: Marybelle Killings M.D.   On: 12/31/2015 15:22   Dg Chest Port 1  View  01/01/2016  CLINICAL DATA:  Shortness of breath. EXAM: PORTABLE CHEST 1 VIEW COMPARISON:  12/31/2015. FINDINGS: Endotracheal tube, NG tube, left subclavian line in stable position. Heart size stable. Diffuse bilateral pulmonary infiltrates consistent with pulmonary edema again noted. Bilateral pleural effusions again noted. Changes of CHF has slightly worsened from prior exam. No pneumothorax. IMPRESSION: 1. Lines and tubes stable position. 2. Congestive heart failure bilateral pulmonary edema and bilateral pleural effusions again noted. Slight worsening from prior exam . Electronically Signed   By: Marcello Moores  Register   On: 01/01/2016 07:07  PCXR: bilateral pulmonary edema ETT good position. NGT was in mid esophagus.   STUDIES:  ECHO 6/17 >> Left ventricle: The cavity size was normal. Systolic function was normal. The estimated ejection fraction was in the range of 50% to 55%. Wall motion was normal; there were no regional wall motion abnormalities.- Mitral valve: Moderate prolapse. There was severe regurgitation. -  Left atrium: The atrium was mildly dilated.- Right ventricle: The cavity size was mildly dilated. Wall thickness was normal.- Right atrium: There is a large mobile mass in the right atrium  that prolapses toward the mitral valve. CT chest 6/17 >> Bilateral pulmonary emboli, nearly occlusive in the left lower lobe segmental branches, involving all of the segmental pulmonary arteries. Evidence of right heart strain with right/left heart ratio of over 1. Heterogeneous area of hypoattenuation within the right atrium may represent right atrial clot versus mixing of non-opacified blood from the inferior vena cava. Bilateral pleural effusions and atelectatic changes of bilateral lung bases. LE [place] 6/17 >> negative for DVT ECHO 6/18 >> LVEF 55-60%, normal wall motion, moderate MVP, mod to severe MR, RA mildly dilated, RA with large moblie pedunculated and irregular mass that prolapses through the TV  c/w thrombus CT ABD/Pelvis 6/18 >> no evidence of new intraperitoneal or pelvic hemorrhage, increasing hematoma in left proximal thigh, decreasing volume of pulmonary embolus in the bilateral lower lobes CT Head 6/18 >> No CT evidence of acute intracranial abnormality ECHO 6/19 >> LVEF 50-55%, normal wall motion, no RWMA, previous RA mass no longer demonstrated on this ECHO, mild increase in PA systolic pressure  CULTURES: BCx2 6/20 >>  Sputum 6/21 >>   ANTIBIOTICS: Perioperative Ancef 6/20 Zosyn >> 6/21 Vancomycin >>  SIGNIFICANT EVENTS: 6/17  Admit after fall with L hip fx,  L HIP replacement, extubated, developed abd pain found to have intraabdominal hematoma.  6/17  AF w/ CE elevation so ECHO checked-->round RA thrombus. Started on heparin. Cards, CVTS and IR involved in care. Later that evening increased resp distress. FIO2 req increased. CT chest w/ bilateral PE 6/18  remaining hypoxic. Hgb stable, repeat ECHO obtained and consideration for EKOS.  PEA Arrest 6/20  On neo 20-80, sedation, heparin.  No weaning.  Anasarca 6/21 drop in HgB, 2 units of PRBC, ordered IVC filter, Heparin gtt stopped  LINES/TUBES: Foley 6/16  PIV ETT 6/16-->6/17 >> 6/18 >> L [place] DLC 6/16 >>  DISCUSSION: Ms. Starke is a 18F who remains intubated after a complicated L hip replacement with arterial avulsion of the left external iliac artery requiring covered stent placement by IR c/b intraabdominal / pelvic hematoma, hgb has been stable. Course complicated by increased CEs, AF, ECHO w/ RA thrombus, later c/b PE, and hypoxic resp failure. She was placed on heparin and multiple specialties have been involved.  6/18 am RV fxn worse and there is still large clot in the RA. 6/18  underwent catheter directed lysis per IR.  She later developed a drop in blood pressure while on TPA and it was discontinued.  Case was discussed with VVS and she was deemed not a surgical candidate.  The patient was stabilized and went  for repeat CT of abd/pelvis which showed increased left thigh hematoma, no increase in pelvic hemorrhage.   Follow up ECHO on 6/19 did not show atrial mass. TPA stopped and heparin gtt initiated. 6/21 Drop in HgB, Heparin gtt stopped, IVC filter placed.    ASSESSMENT / PLAN:  PULMONARY A: Acute Hypoxic Respiratory Failure  Mild Pulmonary edema  Acute bilateral PE-->RV strain has increased but not sure that this is resp for her resp failure   P:   PRVC 8 cc/kg Wean PEEP / FiO2 for sats > 94% Decrease rate to 18, ABG in one hour. Heparin stopped 2/2 decrease in HgB Trend CXR  CARDIOVASCULAR A:  Troponin elevation -->likely demand ischemia  Heart  murmur Intermittent AF Severe MR RA clott;  acute PE (likely migrated)-->appreciate cards, thoracic and interventional assist.  Repeat echo 6/19 without a clot. PEA Arrest - 6/18, approx 10 minutes Shock - resolved P:  Neo if needed to maintain MAP >65, off since 6/21 pm Follow CVP Heparin gtt stopped 2/2 decreased in HgB Evaluate need for pressors once HgB is stable  RENAL A:   Metabolic Acidosis Hypophosphatemia  P:   BMET in AM. Replace electrolytes as indicated. Albumin q6 x4 doses Lasix 10 mg/hr IV drip. Zaroxolyn 5 mg PO x1.  GASTROINTESTINAL A:   Concern for abdominal compartment syndrome--> no evidence of active bleeding  At Risk Protein Calorie Malnutrition P:   Continue TF Famotidine for ppx Trend bladder pressures Q 4hours  HEMATOLOGIC A:   Arterial hemorrhage s/p stenting and requiring massive transfusion propofol RA thrombus-->likely propagated from right pelvic vein compression from hematoma-->migrated and now with BILATERAL PE P:  Trend CBC Transfuse per ICU protocol Heparin stopped 2/2 decrease in HgB  INFECTIOUS A:   No acute issues - leukocytosis likely stress response Fever - new 6/21, abx initiated S/p periop Ancef P:   Vanc/Zosyn started, low threshold to d/c pending review of PCT,  cultures F/U cultures  ENDOCRINE A:   No acute issues  P:   Trend glucose on chemistries   NEUROLOGIC A:   Anxiety and post-op pain  P:   RASS goal: -1 Fent drip for pain PRN versed Space boots for foot drop prevention  FAMILY  - Updates:   No family at bedside am 6/22.    Noe Gens, NP-C [facility] Pgr: [phone] or if no answer [phone] 01/01/2016, 10:17 AM  Attending Note:  75 year old female with hip fracture, iliac fracture, massive transfusion, sleeve placed via IR and subsequent DVT/PE, filter now in place and off anti-coagulation.  On exam, patient is profoundly fluid overloaded.  Tolerating lasix drip well.  Will continue lasix and zaroxolyn.  Hold off weaning efforts.  Attempt to get off pressors today.  Albumin and lasix.  Family updated bedside.  The patient is critically ill with multiple organ systems failure and requires high complexity decision making for assessment and support, frequent evaluation and titration of therapies, application of advanced monitoring technologies and extensive interpretation of multiple databases.   Critical Care Time devoted to patient care services described in this note is  35  Minutes. This time reflects time of care of this signee Dr [name]. This critical care time does not reflect procedure time, or teaching time or supervisory time of PA/NP/Med student/Med Resident etc but could involve care discussion time.  Rush Farmer, M.D. [facility] Pulmonary/Critical Care Medicine. Pager: [phone]. After hours pager: [phone].

## 2016-01-01 NOTE — Progress Notes (Signed)
CRITICAL VALUE ALERT  Critical value received:  Potassium 2.7  Date of notification:  01/01/16  Time of notification:  0505  Critical value read back:Yes.    Nurse who received alert:  Shelda Pal  MD notified (1st page):  Elink  Time of first page:  0505  MD notified (2nd page):  Time of second page:  Responding MD:  elink  Time MD responded:  [phone]

## 2016-01-01 NOTE — Progress Notes (Signed)
[place] Progress Note Patient Name: TARAYA LIEBEL DOB: 1940/08/15 MRN: [id number]   Date of Service  01/01/2016  HPI/Events of Note  K+ = 2.7 and Creatinine = 1.01.  eICU Interventions  Will replace K+.     Intervention Category Intermediate Interventions: Electrolyte abnormality - evaluation and management  Christalynn Boise Cornelia Copa 01/01/2016, 8:55 PM

## 2016-01-01 NOTE — Progress Notes (Signed)
[facility] ADULT ICU REPLACEMENT PROTOCOL FOR AM LAB REPLACEMENT ONLY  The patient does apply for the [facility] Adult ICU Electrolyte Replacment Protocol based on the criteria listed below:   1. Is GFR >/= 40 ml/min? Yes.    Patient's GFR today is >60 2. Is urine output >/= 0.5 ml/kg/hr for the last 6 hours? Yes.   Patient's UOP is 6.2 ml/kg/hr 3. Is BUN < 60 mg/dL? Yes.    Patient's BUN today is 25 4. Abnormal electrolyte  K 2.7 5. Ordered repletion with: per protocol 6. If a panic level lab has been reported, has the CCM MD in charge been notified? Yes.  .   Physician:  Faith Rogue 01/01/2016 5:08 AM

## 2016-01-01 NOTE — Progress Notes (Signed)
Assessment/Plan: 3 Days Post-Op Procedure(s) (LRB): TOTAL HIP ARTHROPLASTY ANTERIOR APPROACH (Left)   Principal Problem:   Fracture of femoral neck, left (HCC) Active Problems:   Hip fracture (HCC)   Leukocytosis   Dehydration   Atrial thrombus (HCC)   Acute pulmonary embolism (HCC)   Acute respiratory failure (HCC)   Hemorrhagic shock   Cardiac arrest (HCC)   Left leg is stable w/ diffuse b/l edema, compartments soft.  Extremities warm, +pulses.   Nods and responds appropriately to questions.  Wiggles toes.  Squeezes hands.  Distal sensation intact.  Not in pain.    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   Subsequent Right atrial thrombus. -Catheter thrombolysis w/ tPA for right atrial thrombus was started and stopped w/ onset of new pain and required CPR/resuscitation on 6/18. -Echo from 12/29/15 - Previous right atrial mass is no longer demonstrated. -Heparin held for IVC filter placement.  Greatly appreciate consultants and their management.  Subjective: Sedated on Ventilator.  Objective: Vital signs in last 24 hours: Temp:  [97.9 F (36.6 C)-99 F (37.2 C)] 98.2 F (36.8 C) (06/22 0324) Pulse Rate:  [29-144] 83 (06/22 0600) Resp:  [9-26] 26 (06/22 0600) BP: (100-159)/(58-109) 125/62 mmHg (06/22 0600) SpO2:  [33 %-100 %] 100 % (06/22 0600) Arterial Line BP: (97-231)/(44-101) 126/52 mmHg (06/22 0600) FiO2 (%):  [40 %-100 %] 60 % (06/22 0400) Weight:  [86 kg (189 lb 9.5 oz)] 86 kg (189 lb 9.5 oz) (06/22 0440)  Intake/Output from previous day: 06/21 0701 - 06/22 0700 In: 4357 [I.V.:1076.1; Blood:587; NG/GT:925; IV Piggyback:1618.8] Out: 9335 [Urine:9335] Intake/Output this shift:     Recent Labs  12/30/15 0201 12/31/15 0400 12/31/15 0951 12/31/15 1615 01/01/16 0550  HGB 7.0* 5.8* 8.4* 8.6* 7.9*    Recent Labs  12/31/15 1615 01/01/16 0550  WBC 14.3* 14.1*  RBC 2.95* 2.71*  HCT 26.3* 24.1*  PLT 103* 91*    Recent Labs  01/01/16 0433 01/01/16 0434  NA 142 142  K 2.8* 2.7*  CL 107 106  CO2 27 27  BUN 25* 25*  CREATININE 0.90 0.91  GLUCOSE 149* 146*  CALCIUM 8.4* 8.4*   Physical Exam General: More alert, on vent.  Nods/shakes head to questions.  Anasarca.  Musculoskeletal: B/L extremities edematous and warm, + pulses.   Dressing: c/d/i  Prudencio Burly III 01/01/2016, 7:30 AM

## 2016-01-02 ENCOUNTER — Inpatient Hospital Stay (HOSPITAL_COMMUNITY): Payer: Medicare HMO

## 2016-01-02 DIAGNOSIS — R601 Generalized edema: Secondary | ICD-10-CM

## 2016-01-02 LAB — CULTURE, RESPIRATORY W GRAM STAIN: Culture: NO GROWTH

## 2016-01-02 LAB — BLOOD GAS, ARTERIAL
ACID-BASE EXCESS: 14.2 mmol/L — AB (ref 0.0–2.0)
BICARBONATE: 38.2 meq/L — AB (ref 20.0–24.0)
DRAWN BY: 364961
FIO2: 0.4
LHR: 22 {breaths}/min
O2 SAT: 98.1 %
PATIENT TEMPERATURE: 98.7
PCO2 ART: 45.5 mmHg — AB (ref 35.0–45.0)
PEEP: 5 cmH2O
PH ART: 7.533 — AB (ref 7.350–7.450)
PO2 ART: 93.3 mmHg (ref 80.0–100.0)
TCO2: 39.6 mmol/L (ref 0–100)
VT: 500 mL

## 2016-01-02 LAB — BASIC METABOLIC PANEL
ANION GAP: 15 (ref 5–15)
Anion gap: 10 (ref 5–15)
BUN: 33 mg/dL — ABNORMAL HIGH (ref 6–20)
BUN: 37 mg/dL — AB (ref 6–20)
CALCIUM: 10.2 mg/dL (ref 8.9–10.3)
CHLORIDE: 93 mmol/L — AB (ref 101–111)
CO2: 37 mmol/L — AB (ref 22–32)
CO2: 38 mmol/L — AB (ref 22–32)
CREATININE: 1.13 mg/dL — AB (ref 0.44–1.00)
Calcium: 9.3 mg/dL (ref 8.9–10.3)
Chloride: 87 mmol/L — ABNORMAL LOW (ref 101–111)
Creatinine, Ser: 1.13 mg/dL — ABNORMAL HIGH (ref 0.44–1.00)
GFR calc Af Amer: 54 mL/min — ABNORMAL LOW (ref >60)
GFR calc non Af Amer: 47 mL/min — ABNORMAL LOW (ref >60)
GFR calc non Af Amer: 47 mL/min — ABNORMAL LOW (ref >60)
GFR, EST AFRICAN AMERICAN: 54 mL/min — AB (ref >60)
GLUCOSE: 163 mg/dL — AB (ref 65–99)
GLUCOSE: 122 mg/dL — AB (ref 65–99)
POTASSIUM: 2.9 mmol/L — AB (ref 3.5–5.1)
Potassium: 2.9 mmol/L — ABNORMAL LOW (ref 3.5–5.1)
Sodium: 140 mmol/L (ref 135–145)
Sodium: 140 mmol/L (ref 135–145)

## 2016-01-02 LAB — MAGNESIUM
Magnesium: 2.1 mg/dL (ref 1.7–2.4)
Magnesium: 2.1 mg/dL (ref 1.7–2.4)

## 2016-01-02 LAB — GLUCOSE, CAPILLARY
GLUCOSE-CAPILLARY: 158 mg/dL — AB (ref 65–99)
Glucose-Capillary: 134 mg/dL — ABNORMAL HIGH (ref 65–99)
Glucose-Capillary: 170 mg/dL — ABNORMAL HIGH (ref 65–99)
Glucose-Capillary: 183 mg/dL — ABNORMAL HIGH (ref 65–99)
Glucose-Capillary: 232 mg/dL — ABNORMAL HIGH (ref 65–99)

## 2016-01-02 LAB — PHOSPHORUS: Phosphorus: 1.4 mg/dL — ABNORMAL LOW (ref 2.5–4.6)

## 2016-01-02 LAB — CULTURE, RESPIRATORY: SPECIAL REQUESTS: NORMAL

## 2016-01-02 LAB — CBC
HEMATOCRIT: 24.6 % — AB (ref 36.0–46.0)
HEMOGLOBIN: 8.1 g/dL — AB (ref 12.0–15.0)
MCH: 29.6 pg (ref 26.0–34.0)
MCHC: 32.9 g/dL (ref 30.0–36.0)
MCV: 89.8 fL (ref 78.0–100.0)
Platelets: 100 10*3/uL — ABNORMAL LOW (ref 150–400)
RBC: 2.74 MIL/uL — ABNORMAL LOW (ref 3.87–5.11)
RDW: 17.2 % — ABNORMAL HIGH (ref 11.5–15.5)
WBC: 12.5 10*3/uL — ABNORMAL HIGH (ref 4.0–10.5)

## 2016-01-02 LAB — TROPONIN I: Troponin I: 0.26 ng/mL — ABNORMAL HIGH (ref <0.031)

## 2016-01-02 LAB — PROCALCITONIN: PROCALCITONIN: 0.53 ng/mL

## 2016-01-02 LAB — CALCIUM, IONIZED: CALCIUM, IONIZED, SERUM: 4.8 mg/dL (ref 4.5–5.6)

## 2016-01-02 MED ORDER — POTASSIUM CHLORIDE 20 MEQ/15ML (10%) PO SOLN
40.0000 meq | Freq: Three times a day (TID) | ORAL | Status: AC
  Administered 2016-01-02: 40 meq
  Filled 2016-01-02 (×2): qty 30

## 2016-01-02 MED ORDER — FAMOTIDINE IN NACL 20-0.9 MG/50ML-% IV SOLN
20.0000 mg | INTRAVENOUS | Status: DC
  Administered 2016-01-02 – 2016-01-06 (×5): 20 mg via INTRAVENOUS
  Filled 2016-01-02 (×6): qty 50

## 2016-01-02 MED ORDER — ACETAZOLAMIDE SODIUM 500 MG IJ SOLR
250.0000 mg | Freq: Four times a day (QID) | INTRAMUSCULAR | Status: AC
  Administered 2016-01-02 – 2016-01-03 (×3): 250 mg via INTRAVENOUS
  Filled 2016-01-02 (×3): qty 250

## 2016-01-02 MED ORDER — DEXTROSE 5 % IV SOLN
30.0000 mmol | Freq: Once | INTRAVENOUS | Status: AC
  Administered 2016-01-02: 30 mmol via INTRAVENOUS
  Filled 2016-01-02: qty 10

## 2016-01-02 MED ORDER — FUROSEMIDE 10 MG/ML IJ SOLN
5.0000 mg/h | INTRAVENOUS | Status: DC
  Administered 2016-01-02: 5 mg/h via INTRAVENOUS
  Filled 2016-01-02: qty 25

## 2016-01-02 MED ORDER — DILTIAZEM HCL 100 MG IV SOLR
5.0000 mg/h | INTRAVENOUS | Status: DC
  Administered 2016-01-02: 5 mg/h via INTRAVENOUS
  Administered 2016-01-03: 15 mg/h via INTRAVENOUS
  Administered 2016-01-03: 3.5 mg/h via INTRAVENOUS
  Filled 2016-01-02 (×4): qty 100

## 2016-01-02 MED ORDER — METOLAZONE 10 MG PO TABS
10.0000 mg | ORAL_TABLET | Freq: Once | ORAL | Status: AC
  Administered 2016-01-02: 10 mg via ORAL
  Filled 2016-01-02: qty 1

## 2016-01-02 MED ORDER — POTASSIUM CHLORIDE 20 MEQ/15ML (10%) PO SOLN
60.0000 meq | Freq: Once | ORAL | Status: AC
  Administered 2016-01-02: 60 meq
  Filled 2016-01-02: qty 45

## 2016-01-02 MED ORDER — ALBUMIN HUMAN 25 % IV SOLN
25.0000 g | Freq: Four times a day (QID) | INTRAVENOUS | Status: AC
  Administered 2016-01-02 – 2016-01-03 (×4): 25 g via INTRAVENOUS
  Filled 2016-01-02: qty 100
  Filled 2016-01-02 (×2): qty 50
  Filled 2016-01-02 (×3): qty 100

## 2016-01-02 MED ORDER — POTASSIUM CHLORIDE 10 MEQ/50ML IV SOLN
10.0000 meq | INTRAVENOUS | Status: AC
  Administered 2016-01-02 – 2016-01-03 (×6): 10 meq via INTRAVENOUS
  Filled 2016-01-02 (×6): qty 50

## 2016-01-02 NOTE — Progress Notes (Signed)
Patient cardiac rhythm A-Fib RVR . Patient asymptomatic. E link notified, orders given. Husband notified at visitation.

## 2016-01-02 NOTE — Progress Notes (Signed)
Weaned well, will extubate.  Rush Farmer, M.D. [facility] Pulmonary/Critical Care Medicine. Pager: [phone]. After hours pager: [phone].

## 2016-01-02 NOTE — Progress Notes (Signed)
[place] Progress Note Patient Name: Bonnie Salazar DOB: Jun 05, 1941 MRN: [id number]   Date of Service  01/02/2016  HPI/Events of Note  Diuresis with Lasix IV drip has resulted in hypokalemia persistent at 2.9. Now hypophosphatemia 1.4. Serum creatinine 1.1. Has central venous access.   eICU Interventions  1. K-Phos 30 mmol IV 2. KCl 60 mg VT x 1      Intervention Category Major Interventions: Electrolyte abnormality - evaluation and management  Tera Partridge 01/02/2016, 5:25 AM

## 2016-01-02 NOTE — Procedures (Signed)
Extubation Procedure Note  Patient Details:   Name: Bonnie Salazar DOB: 07-01-1941 MRN: [id number]   Airway Documentation:     Evaluation  O2 sats: stable throughout Complications: No apparent complications Patient did tolerate procedure well. Bilateral Breath Sounds: Coarse crackles   Yes    Pt extubated to 4L [place] per MD order. Pt stable throughout with no complications. VS within normal limits. Pt has a very strong, productive cough. Able to speak name, post extubation. Rt will continue to monitor.   Ellan Lambert 01/02/2016, 12:07 PM

## 2016-01-02 NOTE — Care Management Important Message (Signed)
Important Message  Patient Details  Name: Bonnie Salazar MRN: [id number] Date of Birth: 06/06/1941   Medicare Important Message Given:  Yes    Nathen May 01/02/2016, 10:25 AM

## 2016-01-02 NOTE — Progress Notes (Signed)
[place] Progress Note Patient Name: Bonnie Salazar DOB: 25-Jan-1941 MRN: [id number]   Date of Service  01/02/2016  HPI/Events of Note  AFIB with RVR - V rate - 120's to 130's.  BP = 138/77.  eICU Interventions  Will order: 1. 12 Lead EKG STAT. 2. Cardizem IV infusion. 3. BMP and Mg++ level STAT. 4. Troponin now and Q 6 hours X 3 sets.     Intervention Category Major Interventions: Arrhythmia - evaluation and management  Sommer,Steven Cornelia Copa 01/02/2016, 5:38 PM

## 2016-01-02 NOTE — Progress Notes (Signed)
Pharmacy Antibiotic Note  Bonnie Salazar is a 75 y.o. female admitted on 12/25/2015 with fever of unknown source.  Pharmacy has been consulted for vancomycin and Zosyn dosing.  Day 4 abx, Tmax 100, wbc 12.5, pct 0.53, and cx no growth to date. Discussed with Dr. Nelda Marseille, will de-escalate and finish 8d course of abx.  Plan: -Stop vancomycin -Zosyn 3.375g IV q8h (4 hour infusion) x 8days -f/u c/s, clinical progression, renal function  Height: 5\' 7"  (170.2 cm) Weight: 172 lb 13.5 oz (78.4 kg) IBW/kg (Calculated) : 61.6  Temp (24hrs), Avg:99.1 F (37.3 C), Min:98.2 F (36.8 C), Max:100 F (37.8 C)   Recent Labs Lab 12/27/15 0040  12/31/15 0400 12/31/15 0951 12/31/15 1615 01/01/16 0433 01/01/16 0434 01/01/16 0550 01/01/16 2013 01/02/16 0420  WBC  --   < > 18.8* 16.4* 14.3*  --   --  14.1*  --  12.5*  CREATININE  --   < > 0.74 0.70  --  0.90 0.91  --  1.01* 1.13*  LATICACIDVEN 2.4*  --   --   --   --   --   --   --   --   --   < > = values in this interval not displayed.  Estimated Creatinine Clearance: 47.1 mL/min (by C-G formula based on Cr of 1.13).    No Known Allergies  Antimicrobials this admission: Vanc 6/20 >> 6/23 Zosyn 6/20 >> 6/27  Dose adjustments this admission: n/a  Microbiology results: 6/20 BCx: NG to date 6/20 Sputum: NG to date 6/16 MRSA PCR: neg  Thank you for allowing pharmacy to be a part of this patient's care.  Heloise Ochoa, [place].D., BCPS PGY2 Cardiology Pharmacy Resident Pager: [phone]  01/02/2016 9:49 AM

## 2016-01-02 NOTE — Progress Notes (Signed)
Assessment/Plan: POD # 7 TOTAL HIP ARTHROPLASTY ANTERIOR APPROACH (Left)   Principal Problem:   Fracture of femoral neck, left (HCC) Active Problems:   Hip fracture (HCC)   Leukocytosis   Dehydration   Atrial thrombus (HCC)   Acute pulmonary embolism (HCC)   Acute respiratory failure (HCC)   Hemorrhagic shock   Cardiac arrest (HCC)   Left leg is stable w/ improvement in diffuse b/l edema, compartments soft.  Extremities warm, +pulses.   Nodding and responds appropriately to questions.  Wiggles toes.  Squeezes hands.  Distal sensation intact.    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   Subsequent Right atrial thrombus. -Catheter thrombolysis w/ tPA for right atrial thrombus was started and stopped w/ onset of new pain and required CPR/resuscitation on 6/18. -Echo from 12/29/15 - Previous right atrial mass is no longer demonstrated. -Heparin held for IVC filter placement. -H/H stable, electrolytes requiring correction dt diuresis.  Greatly appreciate consultants and their management.  Subjective: On Ventilator.  Nodding and responds appropriately to questions.     Objective: Vital signs in last 24 hours: Temp:  [98.2 F (36.8 C)-100 F (37.8 C)] 100 F (37.8 C) (06/23 0757) Pulse Rate:  [79-115] 93 (06/23 0815) Resp:  [14-28] 22 (06/23 0815) BP: (106-163)/(51-119) 126/66 mmHg (06/23 0800) SpO2:  [89 %-100 %] 100 % (06/23 0815) Arterial Line BP: (93-196)/(38-73) 143/47 mmHg (06/23 0815) FiO2 (%):  [40 %-50 %] 40 % (06/23 0400) Weight:  [78.4 kg (172 lb 13.5 oz)] 78.4 kg (172 lb 13.5 oz) (06/23 0152)  Intake/Output from previous day: 06/22 0701 - 06/23 0700 In: 4430.8 [I.V.:1160.8; NG/GT:1500; IV Piggyback:1770] Out: [id number] [Urine:12125] Intake/Output this shift: Total I/O In: -  Out: 1325 [Urine:1325]   Recent Labs  12/31/15 0400 12/31/15 0951 12/31/15 1615 01/01/16 0550 01/02/16 0420  HGB 5.8* 8.4* 8.6* 7.9* 8.1*    Recent Labs  01/01/16 0550 01/02/16 0420  WBC 14.1* 12.5*  RBC 2.71* 2.74*  HCT 24.1* 24.6*  PLT 91* 100*    Recent Labs  01/01/16 2013 01/02/16 0420  NA 139 140  K 2.7* 2.9*  CL 95* 93*  CO2 33* 37*  BUN 29* 33*  CREATININE 1.01* 1.13*  GLUCOSE 180* 163*  CALCIUM 9.0 9.3   Physical Exam General: More alert, on vent.  Nods/shakes head to questions.  Anasarca w/ slight improvement.  Musculoskeletal: B/L extremities edematous and warm, + pulses.   Dressing: c/d/i  Prudencio Burly III 01/02/2016, 8:27 AM

## 2016-01-02 NOTE — Consult Note (Addendum)
WOC wound consult note Reason for Consult: Consult requested for right perineum and groin.  Pt has generalized erythremia and edema and partial thickness skin loss where previous blisters have ruptured and other areas of clear fluid filled intact blisters in a scattered area. This may be the result of medical adhesive related skin damage from tape which was previously applied, or other unknown etiology. Drainage (amount, consistency, odor) Small amt yellow drainage, no odor Dressing procedure/placement/frequency: Xeroform to promote drying and healing.  If there is increased drainage, then cover with ABD pad. Please re-consult if further assistance is needed.  Thank-you,  Julien Girt MSN, [place], [place], [place], [place]

## 2016-01-02 NOTE — Progress Notes (Signed)
Fentanyl gtt d/ced. Wasted fentanyl 125 cc wasted in sink with second RN witnessed Carma Leaven

## 2016-01-02 NOTE — Progress Notes (Signed)
[place] Progress Note Patient Name: Bonnie Salazar DOB: 02-Jan-1941 MRN: [id number]   Date of Service  01/02/2016  HPI/Events of Note  K+ = 2.9 and Creatinine = 1.13.  eICU Interventions  Will replete K+.     Intervention Category Intermediate Interventions: Electrolyte abnormality - evaluation and management  Damica Gravlin [place] 01/02/2016, 7:05 PM

## 2016-01-02 NOTE — Progress Notes (Signed)
PULMONARY / CRITICAL CARE MEDICINE   Name: BRITTYN KLECHA MRN: [id number] DOB: 11/30/1940    ADMISSION DATE:  12/25/2015 CONSULTATION DATE:  12/26/15  REFERRING MD:  Trauma service  CHIEF COMPLAINT:  Hip fracture; need for mechanical ventilation post-operatively  HISTORY OF PRESENT ILLNESS:  Ms. Kerley is a 14F who presented 12/25/15 after an fall at home with resultant left femoral neck fracture. She was taken to the OR for operative repair 6/16. The initial repair appeared uneventful and she was extubated in the PACU. Shortly thereafter, she complained of abdominal pain and was noted to have a tense abdomen. CT scan showed extravasation into the LLQ; she was taken to IR and underwent covered stent of the distal external iliac artery to the inguinal ligament. Vascular surgery was also consulted. No signs of distal ischemia on his exam. There is some concern for abdominal compartment syndrome, as well. MTP was initiated and she received 11u pRBC, 7FFP, 2 PLT pheresis, 2 cryoprecipitate.  Returned to ICU on mechanical ventilation.  She suffered a PEA arrest 6/18.  ICU course complicated by further bleeding.  IVC filter placed.   SUBJECTIVE:  Net negative 7.7 liters, unresponsive this AM.  VITAL SIGNS: BP 152/83 mmHg  Pulse 103  Temp(Src) 100 F (37.8 C) (Oral)  Resp 22  Ht 5\' 7"  (1.702 m)  Wt 78.4 kg (172 lb 13.5 oz)  BMI 27.06 kg/m2  SpO2 100%  HEMODYNAMICS: CVP:  [8 mmHg-12 mmHg] 8 mmHg  VENTILATOR SETTINGS: Vent Mode:  [-] PRVC FiO2 (%):  [40 %-50 %] 40 % Set Rate:  [18 bmp-22 bmp] 22 bmp Vt Set:  [500 mL] 500 mL PEEP:  [5 cmH20] 5 cmH20 Plateau Pressure:  [16 cmH20-20 cmH20] 16 cmH20  INTAKE / OUTPUT:  Intake/Output Summary (Last 24 hours) at 01/02/16 0941 Last data filed at 01/02/16 0900  Gross per 24 hour  Intake 5629.33 ml  Output  12525 ml  Net -6895.67 ml   PHYSICAL EXAMINATION:  General Acutely ill appearing female, on vent  HEENT [place]/AT, PERRLA, EOM-I and  MMM.  Pulmonary ETT in place, coarse BS diffusely.  Cardiovascular Regular; II/VI diastolic heart murmur.  Distal pulses palpable.  Abdomen Distended, hypoactive bowel sounds.  Musculoskeletal L hip post-surgical changes / dressing in place, mild ecchymosis  Lymphatics No cervical, supraclavicular or axillary adenopathy.   Neurologic Grossly intact. No focal deficits. Follows commands x 4  Skin/Integuement No rash, no cyanosis, no clubbing. Pale. L [place] CVC in place. LLE edematous / generalized anasarca   LABS:  BMET  Recent Labs Lab 01/01/16 0434 01/01/16 2013 01/02/16 0420  NA 142 139 140  K 2.7* 2.7* 2.9*  CL 106 95* 93*  CO2 27 33* 37*  BUN 25* 29* 33*  CREATININE 0.91 1.01* 1.13*  GLUCOSE 146* 180* 163*   Electrolytes  Recent Labs Lab 01/01/16 0433 01/01/16 0434 01/01/16 2013 01/02/16 0420  CALCIUM 8.4* 8.4* 9.0 9.3  MG 1.7  --  2.1 2.1  PHOS 2.0* 2.1*  --  1.4*   CBC  Recent Labs Lab 12/31/15 1615 01/01/16 0550 01/02/16 0420  WBC 14.3* 14.1* 12.5*  HGB 8.6* 7.9* 8.1*  HCT 26.3* 24.1* 24.6*  PLT 103* 91* 100*   Coag's  Recent Labs Lab 12/26/15 1740 12/27/15 12/27/15 0400 12/31/15 0951  APTT 29  --   --  33  INR 2.01* 1.35 1.37 1.30     Sepsis Markers  Recent Labs Lab 12/27/15 0040 01/01/16 1100 01/02/16 0420  LATICACIDVEN 2.4*  --   --  PROCALCITON  --  0.59 0.53   ABG  Recent Labs Lab 01/01/16 0335 01/01/16 1153 01/02/16 0327  PHART 7.459* 7.402 7.533*  PCO2ART 38.3 50.5* 45.5*  PO2ART 136* 86.6 93.3   Liver Enzymes  Recent Labs Lab 12/27/15 0400 12/29/15 0306 12/31/15 0400 01/01/16 0434  AST 45* 52* 60*  --   ALT 25 21 24   --   ALKPHOS 59 48 77  --   BILITOT 1.1 0.6 1.1  --   ALBUMIN 3.1* 2.5* 2.1* 3.4*   Cardiac Enzymes  Recent Labs Lab 12/27/15 0400 12/27/15 1037 12/27/15 1526  TROPONINI 1.20* 2.00* 2.06*   Glucose  Recent Labs Lab 01/01/16 1158 01/01/16 1601 01/01/16 2004 01/01/16 2335  01/02/16 0356 01/02/16 0755  GLUCAP 180* 144* 174* 134* 170* 183*   Imaging Dg Chest Port 1 View  01/02/2016  CLINICAL DATA:  Respiratory failure and cardiac arrest. EXAM: PORTABLE CHEST 1 VIEW COMPARISON:  01/01/2016 FINDINGS: Endotracheal tube tip is approximately 5 cm above the carina. Left central line tip remains in the SVC. Lungs show some improvement and bibasilar aeration with diminished appearance of pleural effusions. No overt edema, focal airspace consolidation or pneumothorax identified. The heart size is normal. IMPRESSION: Improved aeration of both lungs with decreased prominence of pleural effusions. Electronically Signed   By: Aletta Edouard M.D.   On: 01/02/2016 07:37  PCXR: bilateral pulmonary edema ETT good position. NGT was in mid esophagus.   STUDIES:  ECHO 6/17 >> Left ventricle: The cavity size was normal. Systolic function was normal. The estimated ejection fraction was in the range of 50% to 55%. Wall motion was normal; there were no regional wall motion abnormalities.- Mitral valve: Moderate prolapse. There was severe regurgitation. - Left atrium: The atrium was mildly dilated.- Right ventricle: The cavity size was mildly dilated. Wall thickness was normal.- Right atrium: There is a large mobile mass in the right atrium  that prolapses toward the mitral valve. CT chest 6/17 >> Bilateral pulmonary emboli, nearly occlusive in the left lower lobe segmental branches, involving all of the segmental pulmonary arteries. Evidence of right heart strain with right/left heart ratio of over 1. Heterogeneous area of hypoattenuation within the right atrium may represent right atrial clot versus mixing of non-opacified blood from the inferior vena cava. Bilateral pleural effusions and atelectatic changes of bilateral lung bases. LE [place] 6/17 >> negative for DVT ECHO 6/18 >> LVEF 55-60%, normal wall motion, moderate MVP, mod to severe MR, RA mildly dilated, RA with large moblie pedunculated  and irregular mass that prolapses through the TV c/w thrombus CT ABD/Pelvis 6/18 >> no evidence of new intraperitoneal or pelvic hemorrhage, increasing hematoma in left proximal thigh, decreasing volume of pulmonary embolus in the bilateral lower lobes CT Head 6/18 >> No CT evidence of acute intracranial abnormality ECHO 6/19 >> LVEF 50-55%, normal wall motion, no RWMA, previous RA mass no longer demonstrated on this ECHO, mild increase in PA systolic pressure  CULTURES: BCx2 6/20 >>  Sputum 6/21 >>   ANTIBIOTICS: Perioperative Ancef 6/20 Zosyn >> 6/21 Vancomycin >>  SIGNIFICANT EVENTS: 6/17  Admit after fall with L hip fx,  L HIP replacement, extubated, developed abd pain found to have intraabdominal hematoma.  6/17  AF w/ CE elevation so ECHO checked-->round RA thrombus. Started on heparin. Cards, CVTS and IR involved in care. Later that evening increased resp distress. FIO2 req increased. CT chest w/ bilateral PE 6/18  remaining hypoxic. Hgb stable, repeat ECHO obtained and consideration for  EKOS.  PEA Arrest 6/20  On neo 20-80, sedation, heparin.  No weaning.  Anasarca 6/21 drop in HgB, 2 units of PRBC, ordered IVC filter, Heparin gtt stopped  LINES/TUBES: Foley 6/16  PIV ETT 6/16-->6/17 >> 6/18 >> L [place] DLC 6/16 >>  DISCUSSION: Ms. Mosely is a 25F who remains intubated after a complicated L hip replacement with arterial avulsion of the left external iliac artery requiring covered stent placement by IR c/b intraabdominal / pelvic hematoma, hgb has been stable. Course complicated by increased CEs, AF, ECHO w/ RA thrombus, later c/b PE, and hypoxic resp failure. She was placed on heparin and multiple specialties have been involved.  6/18 am RV fxn worse and there is still large clot in the RA. 6/18  underwent catheter directed lysis per IR.  She later developed a drop in blood pressure while on TPA and it was discontinued.  Case was discussed with VVS and she was deemed not a surgical  candidate.  The patient was stabilized and went for repeat CT of abd/pelvis which showed increased left thigh hematoma, no increase in pelvic hemorrhage.   Follow up ECHO on 6/19 did not show atrial mass. TPA stopped and heparin gtt initiated. 6/21 Drop in HgB, Heparin gtt stopped, IVC filter placed.    ASSESSMENT / PLAN:  PULMONARY A: Acute Hypoxic Respiratory Failure  Mild Pulmonary edema  Acute bilateral PE-->RV strain has increased but not sure that this is resp for her resp failure   P:   PRVC 8 cc/kg Wean PEEP / FiO2 for sats > 94% Decrease rate to 18, ABG in one hour. Heparin stopped 2/2 decrease in HgB Trend CXR Wake patient up and begin PS trials.  CARDIOVASCULAR A:  Troponin elevation -->likely demand ischemia  Heart murmur Intermittent AF Severe MR RA clott;  acute PE (likely migrated)-->appreciate cards, thoracic and interventional assist.  Repeat echo 6/19 without a clot. PEA Arrest - 6/18, approx 10 minutes Shock - resolved P:  Neo if needed to maintain MAP >65, off since 6/21 pm Follow CVP Heparin gtt stopped 2/2 decreased in HgB Evaluate need for pressors once HgB is stable  RENAL A:   Metabolic Acidosis Hypophosphatemia  P:   BMET in AM. Replace electrolytes as indicated. Albumin q6 x4 doses. Lasix 5 mg/hr IV drip. Zaroxolyn 5 mg PO x1. Diamox 250 mg IV q6 x4 doses.  GASTROINTESTINAL A:   Concern for abdominal compartment syndrome--> no evidence of active bleeding  At Risk Protein Calorie Malnutrition P:   Continue TF. Famotidine for ppx. Trend bladder pressures Q 4hours.  HEMATOLOGIC A:   Arterial hemorrhage s/p stenting and requiring massive transfusion propofol RA thrombus-->likely propagated from right pelvic vein compression from hematoma-->migrated and now with BILATERAL PE P:  Trend CBC. Transfuse per ICU protocol. Heparin stopped 2/2 decrease in HgB.  INFECTIOUS A:   No acute issues - leukocytosis likely stress response Fever  - new 6/21, abx initiated S/p periop Ancef P:   D/C vancomycin. Zosyn for total of 8 days (stop day in place). F/U cultures.  ENDOCRINE A:   No acute issues  P:   Trend glucose on chemistries   NEUROLOGIC A:   Anxiety and post-op pain  P:   RASS goal: -1. Fent drip for pain. PRN versed. Una boots for foot drop prevention.  FAMILY  - Updates:   No family at bedside am 6/23.   The patient is critically ill with multiple organ systems failure and requires high complexity decision  making for assessment and support, frequent evaluation and titration of therapies, application of advanced monitoring technologies and extensive interpretation of multiple databases.   Critical Care Time devoted to patient care services described in this note is  35  Minutes. This time reflects time of care of this signee Dr [name]. This critical care time does not reflect procedure time, or teaching time or supervisory time of PA/NP/Med student/Med Resident etc but could involve care discussion time.  Rush Farmer, M.D. [facility] Pulmonary/Critical Care Medicine. Pager: [phone]. After hours pager: [phone].

## 2016-01-02 NOTE — Progress Notes (Signed)
Patient ID: Bonnie Salazar, female   DOB: 1940/08/26, 75 y.o.   MRN: [id number]    Referring Physician(s): [name]  Supervising Physician: Aletta Edouard  Patient Status: inpt  Chief Complaint: S/p thrombolysis and IVC filter placement  Subjective: Patient improving.  Has voided 20L of urine in the last 2 days.    Allergies: Review of patient's allergies indicates no known allergies.  Medications: Prior to Admission medications   Medication Sig Start Date End Date Taking? Authorizing Provider  CALCIUM PO Take 2 tablets by mouth daily.   Yes Historical Provider, MD  chlorhexidine (PERIDEX) 0.12 % solution 15 mLs by Mouth Rinse route 2 (two) times daily. 12/22/15  Yes Historical Provider, MD  Cholecalciferol (VITAMIN D PO) Take 1 capsule by mouth daily.   Yes Historical Provider, MD  fluticasone (FLONASE) 50 MCG/ACT nasal spray Place 2 sprays into the nose daily as needed for allergies.  11/01/15  Yes Historical Provider, MD  GINKGO BILOBA PO Take 1 capsule by mouth daily.   Yes Historical Provider, MD  Misc Natural Products (GLUCOSAMINE CHONDROITIN TRIPLE) TABS Take 2 tablets by mouth daily.   Yes Historical Provider, MD  Multiple Vitamin (MULTIVITAMIN WITH MINERALS) TABS tablet Take 1 tablet by mouth daily.   Yes Historical Provider, MD  Multiple Vitamins-Minerals (ICAPS AREDS 2) CAPS Take 1 capsule by mouth 2 (two) times daily.   Yes Historical Provider, MD  naproxen sodium (ANAPROX) 220 MG tablet Take 220 mg by mouth at bedtime as needed (pain).   Yes Historical Provider, MD  Omega-3 Fatty Acids (OMEGA 3 PO) Take 1 capsule by mouth daily.   Yes Historical Provider, MD  aspirin EC 325 MG tablet Take 1 tablet (325 mg total) by mouth daily. 12/26/15   Geradine Girt, DO  omeprazole (PRILOSEC) 20 MG capsule Take 1 capsule (20 mg total) by mouth daily. 12/26/15   Geradine Girt, DO  ondansetron (ZOFRAN) 4 MG tablet Take 1 tablet (4 mg total) by mouth every 8 (eight) hours as  needed for nausea or vomiting. 12/26/15   Geradine Girt, DO  oxyCODONE-acetaminophen (ROXICET) 5-325 MG tablet Take 1-2 tablets by mouth every 4 (four) hours as needed for severe pain. 12/26/15   Geradine Girt, DO    Vital Signs: BP 152/83 mmHg  Pulse 103  Temp(Src) 100 F (37.8 C) (Oral)  Resp 22  Ht 5\' 7"  (1.702 m)  Wt 172 lb 13.5 oz (78.4 kg)  BMI 27.06 kg/m2  SpO2 100%  Physical Exam: Neck: IVC insertion site is clean and healing well.  No evidence of bleeding or hematoma Abd: much softer and less diffuse anasarca.  Imaging: Ir Ivc Filter Plmt / S&i /img Guid/mod Sed  12/31/2015  INDICATION: Pulmonary thromboembolism.  Internal hemorrhage. EXAM: IVC FILTER,INFERIOR VENA CAVOGRAM MEDICATIONS: None. ANESTHESIA/SEDATION: Patient was sedated by the Critical Care team FLUOROSCOPY TIME:  Fluoroscopy Time: 1 minutes 12 seconds (53 mGy). COMPLICATIONS: None immediate. PROCEDURE: Informed written consent was obtained from the patient after a thorough discussion of the procedural risks, benefits and alternatives. All questions were addressed. Maximal Sterile Barrier Technique was utilized including caps, mask, sterile gowns, sterile gloves, sterile drape, hand hygiene and skin antiseptic. A timeout was performed prior to the initiation of the procedure. The right neck was prepped with Betadine in a sterile fashion, and a sterile drape was applied covering the operative field. A sterile gown and sterile gloves were used for the procedure. The right jugular vein was noted to be  patent initially with ultrasound. Under sonographic guidance, a micropuncture needle was inserted into the right jugular vein (Ultrasound image documentation was performed). It was removed over an 018 wire which was up-sized to a [place]. The sheath was inserted over the wire and into the IVC. IVC venography was performed. The temporary filter was then deployed in the infrarenal IVC. The sheath was removed and hemostasis was  achieved with direct pressure. FINDINGS: Venography confirms patency of the IVC without venous anomaly Final image demonstrates placement of an IVC filter with its tip at the L2-3 disc. IMPRESSION: Successful infrarenal IVC filter placement. This is a temporary filter. It can be removed or remain in place to become permanent. Electronically Signed   By: Marybelle Killings M.D.   On: 12/31/2015 15:22   Dg Chest Port 1 View  01/02/2016  CLINICAL DATA:  Respiratory failure and cardiac arrest. EXAM: PORTABLE CHEST 1 VIEW COMPARISON:  01/01/2016 FINDINGS: Endotracheal tube tip is approximately 5 cm above the carina. Left central line tip remains in the SVC. Lungs show some improvement and bibasilar aeration with diminished appearance of pleural effusions. No overt edema, focal airspace consolidation or pneumothorax identified. The heart size is normal. IMPRESSION: Improved aeration of both lungs with decreased prominence of pleural effusions. Electronically Signed   By: Aletta Edouard M.D.   On: 01/02/2016 07:37   Dg Chest Port 1 View  01/01/2016  CLINICAL DATA:  Shortness of breath. EXAM: PORTABLE CHEST 1 VIEW COMPARISON:  12/31/2015. FINDINGS: Endotracheal tube, NG tube, left subclavian line in stable position. Heart size stable. Diffuse bilateral pulmonary infiltrates consistent with pulmonary edema again noted. Bilateral pleural effusions again noted. Changes of CHF has slightly worsened from prior exam. No pneumothorax. IMPRESSION: 1. Lines and tubes stable position. 2. Congestive heart failure bilateral pulmonary edema and bilateral pleural effusions again noted. Slight worsening from prior exam . Electronically Signed   By: [place]   On: 01/01/2016 07:07   Dg Chest Port 1 View  12/31/2015  CLINICAL DATA:  Acute respiratory failure. EXAM: PORTABLE CHEST 1 VIEW COMPARISON:  12/30/2015. FINDINGS: Endotracheal tube and NG tube in stable position. Left subclavian line stable position. Stable cardiomegaly.  Diffuse bilateral pulmonary infiltrates/ edema with slight interim improvement. Persistent bilateral effusions noted also with slight improvement. No pneumothorax . IMPRESSION: 1. Lines and tubes stable position. 2. Cardiomegaly with diffuse bilateral pulmonary infiltrates consistent with pulmonary edema. Slight interim improvement. Bilateral pleural effusions. Slight interim improvement . Electronically Signed   By: [place]   On: 12/31/2015 06:57   Dg Chest Port 1 View  12/30/2015  CLINICAL DATA:  Shortness of breath. EXAM: PORTABLE CHEST 1 VIEW COMPARISON:  12/29/2015. FINDINGS: Endotracheal tube, NG tube, left subclavian line stable position. Cardiomegaly with diffuse bilateral pulmonary infiltrates pleural effusions consistent with congestive heart failure. No pneumothorax. IMPRESSION: 1. Lines and tubes stable position. 2. Cardiomegaly with diffuse bilateral pulmonary infiltrates and bilateral pleural effusions consistent with congestive heart failure. Electronically Signed   By: Marcello Moores  Register   On: 12/30/2015 06:48    Labs:  CBC:  Recent Labs  12/31/15 0951 12/31/15 1615 01/01/16 0550 01/02/16 0420  WBC 16.4* 14.3* 14.1* 12.5*  HGB 8.4* 8.6* 7.9* 8.1*  HCT 25.6* 26.3* 24.1* 24.6*  PLT 118* 103* 91* 100*    COAGS:  Recent Labs  12/26/15 1740 12/27/15 12/27/15 0400 12/31/15 0951  INR 2.01* 1.35 1.37 1.30  APTT 29  --   --  33    BMP:  Recent  Labs  01/01/16 0433 01/01/16 0434 01/01/16 2013 01/02/16 0420  NA 142 142 139 140  K 2.8* 2.7* 2.7* 2.9*  CL 107 106 95* 93*  CO2 27 27 33* 37*  GLUCOSE 149* 146* 180* 163*  BUN 25* 25* 29* 33*  CALCIUM 8.4* 8.4* 9.0 9.3  CREATININE 0.90 0.91 1.01* 1.13*  GFRNONAA >60 >60 53* 47*  GFRAA >60 >60 >60 54*    LIVER FUNCTION TESTS:  Recent Labs  12/27/15 12/27/15 0400 12/29/15 0306 12/31/15 0400 01/01/16 0434  BILITOT 1.2 1.1 0.6 1.1  --   AST 51* 45* 52* 60*  --   ALT 28 25 21 24   --   ALKPHOS 66 59 48  77  --   PROT 6.0* 5.6* 4.7* 4.3*  --   ALBUMIN 3.5 3.1* 2.5* 2.1* 3.4*    Assessment and Plan: 1. S/p embolization of pseudoaneurysm with subsequent thrombolysis secondary to atrial thrombus 2. S/p IVC filter placement  -patient overall improving, hoping to be extubated in the next 24-48 hrs. -she will come back and see [place] for retrieval and our office will get that set up.  Electronically Signed: Henreitta Cea 01/02/2016, 10:10 AM   I spent a total of 15 Minutes at the the patient's bedside AND on the patient's hospital floor or unit, greater than 50% of which was counseling/coordinating care for embolization of pseudoaneurysm and IVC filter placement

## 2016-01-03 ENCOUNTER — Inpatient Hospital Stay (HOSPITAL_COMMUNITY): Payer: Medicare HMO

## 2016-01-03 LAB — BASIC METABOLIC PANEL WITH GFR
Anion gap: 18 — ABNORMAL HIGH (ref 5–15)
BUN: 54 mg/dL — ABNORMAL HIGH (ref 6–20)
CO2: 32 mmol/L (ref 22–32)
Calcium: 10.4 mg/dL — ABNORMAL HIGH (ref 8.9–10.3)
Chloride: 89 mmol/L — ABNORMAL LOW (ref 101–111)
Creatinine, Ser: 1.26 mg/dL — ABNORMAL HIGH (ref 0.44–1.00)
GFR calc Af Amer: 47 mL/min — ABNORMAL LOW
GFR calc non Af Amer: 41 mL/min — ABNORMAL LOW
Glucose, Bld: 134 mg/dL — ABNORMAL HIGH (ref 65–99)
Potassium: 2.2 mmol/L — CL (ref 3.5–5.1)
Sodium: 139 mmol/L (ref 135–145)

## 2016-01-03 LAB — BASIC METABOLIC PANEL
Anion gap: 19 — ABNORMAL HIGH (ref 5–15)
BUN: 43 mg/dL — AB (ref 6–20)
CO2: 35 mmol/L — ABNORMAL HIGH (ref 22–32)
Calcium: 10.8 mg/dL — ABNORMAL HIGH (ref 8.9–10.3)
Chloride: 83 mmol/L — ABNORMAL LOW (ref 101–111)
Creatinine, Ser: 1.18 mg/dL — ABNORMAL HIGH (ref 0.44–1.00)
GFR calc Af Amer: 51 mL/min — ABNORMAL LOW (ref >60)
GFR, EST NON AFRICAN AMERICAN: 44 mL/min — AB (ref >60)
GLUCOSE: 149 mg/dL — AB (ref 65–99)
POTASSIUM: 2.5 mmol/L — AB (ref 3.5–5.1)
Sodium: 137 mmol/L (ref 135–145)

## 2016-01-03 LAB — GLUCOSE, CAPILLARY
GLUCOSE-CAPILLARY: 115 mg/dL — AB (ref 65–99)
GLUCOSE-CAPILLARY: 154 mg/dL — AB (ref 65–99)
GLUCOSE-CAPILLARY: 129 mg/dL — AB (ref 65–99)
Glucose-Capillary: 164 mg/dL — ABNORMAL HIGH (ref 65–99)
Glucose-Capillary: 174 mg/dL — ABNORMAL HIGH (ref 65–99)
Glucose-Capillary: 176 mg/dL — ABNORMAL HIGH (ref 65–99)
Glucose-Capillary: 197 mg/dL — ABNORMAL HIGH (ref 65–99)

## 2016-01-03 LAB — CBC
HCT: 29.1 % — ABNORMAL LOW (ref 36.0–46.0)
Hemoglobin: 9.4 g/dL — ABNORMAL LOW (ref 12.0–15.0)
MCH: 29.4 pg (ref 26.0–34.0)
MCHC: 32.3 g/dL (ref 30.0–36.0)
MCV: 90.9 fL (ref 78.0–100.0)
Platelets: 147 10*3/uL — ABNORMAL LOW (ref 150–400)
RBC: 3.2 MIL/uL — ABNORMAL LOW (ref 3.87–5.11)
RDW: 17.1 % — ABNORMAL HIGH (ref 11.5–15.5)
WBC: 15.3 10*3/uL — ABNORMAL HIGH (ref 4.0–10.5)

## 2016-01-03 LAB — BLOOD GAS, ARTERIAL
ACID-BASE EXCESS: 13.3 mmol/L — AB (ref 0.0–2.0)
Bicarbonate: 36.8 mEq/L — ABNORMAL HIGH (ref 20.0–24.0)
Drawn by: 365271
O2 Content: 4 L/min
O2 SAT: 96.6 %
PATIENT TEMPERATURE: 98.6
PCO2 ART: 40.5 mmHg (ref 35.0–45.0)
TCO2: 38.1 mmol/L (ref 0–100)
pH, Arterial: 7.567 — ABNORMAL HIGH (ref 7.350–7.450)
pO2, Arterial: 77.8 mmHg — ABNORMAL LOW (ref 80.0–100.0)

## 2016-01-03 LAB — PHOSPHORUS: Phosphorus: 2.8 mg/dL (ref 2.5–4.6)

## 2016-01-03 LAB — MAGNESIUM: Magnesium: 2.3 mg/dL (ref 1.7–2.4)

## 2016-01-03 LAB — PROCALCITONIN: Procalcitonin: 0.53 ng/mL

## 2016-01-03 LAB — TROPONIN I: Troponin I: 0.23 ng/mL — ABNORMAL HIGH (ref <0.031)

## 2016-01-03 MED ORDER — POTASSIUM CHLORIDE 10 MEQ/50ML IV SOLN
10.0000 meq | INTRAVENOUS | Status: AC
  Administered 2016-01-03 – 2016-01-04 (×6): 10 meq via INTRAVENOUS
  Filled 2016-01-03 (×5): qty 50

## 2016-01-03 MED ORDER — METOPROLOL TARTRATE 5 MG/5ML IV SOLN
2.5000 mg | INTRAVENOUS | Status: DC | PRN
Start: 2016-01-03 — End: 2016-01-16
  Administered 2016-01-03: 5 mg via INTRAVENOUS

## 2016-01-03 MED ORDER — METOPROLOL TARTRATE 5 MG/5ML IV SOLN
INTRAVENOUS | Status: AC
  Filled 2016-01-03: qty 5

## 2016-01-03 MED ORDER — DOCUSATE SODIUM 50 MG/5ML PO LIQD
100.0000 mg | Freq: Two times a day (BID) | ORAL | Status: DC | PRN
  Filled 2016-01-03: qty 10

## 2016-01-03 MED ORDER — POTASSIUM CHLORIDE 10 MEQ/50ML IV SOLN
10.0000 meq | INTRAVENOUS | Status: AC
  Administered 2016-01-03 (×8): 10 meq via INTRAVENOUS
  Filled 2016-01-03 (×8): qty 50

## 2016-01-03 NOTE — Progress Notes (Signed)
[place] Progress Note Patient Name: Bonnie Salazar DOB: [birth year] MRN: [id number]   Date of Service  01/03/2016  HPI/Events of Note  Notified K 2.5. Currently on Lasix gtt. Mag 2.3 & Creatinine 1.18. Has central access & not taking PO yet.  eICU Interventions  1. KCl 33mEq IV x8 runs 2. BMP at 1700 today     Intervention Category Intermediate Interventions: Electrolyte abnormality - evaluation and management  Tera Partridge 01/03/2016, 5:42 AM

## 2016-01-03 NOTE — Progress Notes (Signed)
PT Cancellation Note  Patient Details Name: Bonnie Salazar MRN: [id number] DOB: [birth year]   Cancelled Treatment:    Reason Eval/Treat Not Completed: Medical issues which prohibited therapy (Nurse stated he was putting pt in chair position today. )  Nurse asked PT to check back tomorrow and pt may be ready for OOB.  Thanks.   Irwin Brakeman F 01/03/2016, 8:58 AM [facility] [phone] (pager)

## 2016-01-03 NOTE — Progress Notes (Signed)
[place] Progress Note Patient Name: Bonnie Salazar DOB: 17-Nov-1940 MRN: [id number]   Date of Service  01/03/2016  HPI/Events of Note  K+ = 2.2 and Creatinine = 1.3.  eICU Interventions  Will replete K+.     Intervention Category Intermediate Interventions: Electrolyte abnormality - evaluation and management  Darrien Laakso [place] 01/03/2016, 6:42 PM

## 2016-01-03 NOTE — Progress Notes (Signed)
Critical potasium 2.2 reported to E-MD  Dr. Oletta Darter

## 2016-01-03 NOTE — Progress Notes (Signed)
PULMONARY / CRITICAL CARE MEDICINE   Name: Bonnie Salazar MRN: [id number] DOB: 10-17-1940    ADMISSION DATE:  12/25/2015 CONSULTATION DATE:  12/26/15  REFERRING MD:  Trauma service  CHIEF COMPLAINT:  Hip fracture; need for mechanical ventilation post-operatively  HISTORY OF PRESENT ILLNESS:  Ms. Bonnie Salazar is a 29F who presented 12/25/15 after an fall at home with resultant left femoral neck fracture. She was taken to the OR for operative repair 6/16. The initial repair appeared uneventful and she was extubated in the PACU. Shortly thereafter, she complained of abdominal pain and was noted to have a tense abdomen. CT scan showed extravasation into the LLQ; she was taken to IR and underwent covered stent of the distal external iliac artery to the inguinal ligament. Vascular surgery was also consulted. No signs of distal ischemia on his exam. There is some concern for abdominal compartment syndrome, as well. MTP was initiated and she received 11u pRBC, 7FFP, 2 PLT pheresis, 2 cryoprecipitate.  Returned to ICU on mechanical ventilation.  She suffered a PEA arrest 6/18.  ICU course complicated by further bleeding.  IVC filter placed.   SUBJECTIVE:  Tolerating extubation, remains on lasix gtt, net neg 7.8 L in last 24 hours, KCL replacement  VITAL SIGNS: BP 96/55 mmHg  Pulse 140  Temp(Src) 99.1 F (37.3 C) (Oral)  Resp 25  Ht 5\' 7"  (1.702 m)  Wt 147 lb 7.8 oz (66.9 kg)  BMI 23.09 kg/m2  SpO2 97%  HEMODYNAMICS: CVP:  [6 mmHg-14 mmHg] 7 mmHg  VENTILATOR SETTINGS: Vent Mode:  [-] CPAP;PSV FiO2 (%):  [40 %] 40 % PEEP:  [5 cmH20] 5 cmH20 Pressure Support:  [5 cmH20] 5 cmH20  INTAKE / OUTPUT:  Intake/Output Summary (Last 24 hours) at 01/03/16 0847 Last data filed at 01/03/16 0800  Gross per 24 hour  Intake 1570.58 ml  Output   8125 ml  Net -6554.42 ml   PHYSICAL EXAMINATION:  General Acutely ill appearing female in NAD  HEENT [place]/AT, PERRLA, EOM-I and MMM.  Pulmonary Non-labored,  lungs coarse bilaterally   Cardiovascular Regular; II/VI diastolic heart murmur.  Distal pulses palpable.  Abdomen Distended, hypoactive bowel sounds.  Musculoskeletal L hip post-surgical changes / dressing in place, mild ecchymosis  Lymphatics No cervical, supraclavicular or axillary adenopathy.   Neurologic Grossly intact. No focal deficits. Follows commands x 4  Skin/Integuement No rash, no cyanosis, no clubbing. L [place] CVC in place. LLE edematous / generalized anasarca   LABS:  BMET  Recent Labs Lab 01/02/16 0420 01/02/16 1803 01/03/16 0449  NA 140 140 137  K 2.9* 2.9* 2.5*  CL 93* 87* 83*  CO2 37* 38* 35*  BUN 33* 37* 43*  CREATININE 1.13* 1.13* 1.18*  GLUCOSE 163* 122* 149*   Electrolytes  Recent Labs Lab 01/01/16 0434  01/02/16 0420 01/02/16 1803 01/03/16 0449  CALCIUM 8.4*  < > 9.3 10.2 10.8*  MG  --   < > 2.1 2.1 2.3  PHOS 2.1*  --  1.4*  --  2.8  < > = values in this interval not displayed. CBC  Recent Labs Lab 01/01/16 0550 01/02/16 0420 01/03/16 0449  WBC 14.1* 12.5* 15.3*  HGB 7.9* 8.1* 9.4*  HCT 24.1* 24.6* 29.1*  PLT 91* 100* 147*   Coag's  Recent Labs Lab 12/31/15 0951  APTT 33  INR 1.30     Sepsis Markers  Recent Labs Lab 01/01/16 1100 01/02/16 0420 01/03/16 0449  PROCALCITON 0.59 0.53 0.53   ABG  Recent Labs Lab 01/01/16 1153 01/02/16 0327 01/03/16 0345  PHART 7.402 7.533* 7.567*  PCO2ART 50.5* 45.5* 40.5  PO2ART 86.6 93.3 77.8*   Liver Enzymes  Recent Labs Lab 12/29/15 0306 12/31/15 0400 01/01/16 0434  AST 52* 60*  --   ALT 21 24  --   ALKPHOS 48 77  --   BILITOT 0.6 1.1  --   ALBUMIN 2.5* 2.1* 3.4*   Cardiac Enzymes  Recent Labs Lab 12/27/15 1526 01/02/16 1803 01/03/16 0449  TROPONINI 2.06* 0.26* 0.23*   Glucose  Recent Labs Lab 01/02/16 0755 01/02/16 1157 01/02/16 1546 01/02/16 2001 01/03/16 0010 01/03/16 0316  GLUCAP 183* 232* 115* 158* 197* 176*   Imaging No results found.   STUDIES:   ECHO 6/17 >> Left ventricle: The cavity size was normal. Systolic function was normal. The estimated ejection fraction was in the range of 50% to 55%. Wall motion was normal; there were no regional wall motion abnormalities.- Mitral valve: Moderate prolapse. There was severe regurgitation. - Left atrium: The atrium was mildly dilated.- Right ventricle: The cavity size was mildly dilated. Wall thickness was normal.- Right atrium: There is a large mobile mass in the right atrium  that prolapses toward the mitral valve. CT chest 6/17 >> Bilateral pulmonary emboli, nearly occlusive in the left lower lobe segmental branches, involving all of the segmental pulmonary arteries. Evidence of right heart strain with right/left heart ratio of over 1. Heterogeneous area of hypoattenuation within the right atrium may represent right atrial clot versus mixing of non-opacified blood from the inferior vena cava. Bilateral pleural effusions and atelectatic changes of bilateral lung bases. LE [place] 6/17 >> negative for DVT ECHO 6/18 >> LVEF 55-60%, normal wall motion, moderate MVP, mod to severe MR, RA mildly dilated, RA with large moblie pedunculated and irregular mass that prolapses through the TV c/w thrombus CT ABD/Pelvis 6/18 >> no evidence of new intraperitoneal or pelvic hemorrhage, increasing hematoma in left proximal thigh, decreasing volume of pulmonary embolus in the bilateral lower lobes CT Head 6/18 >> No CT evidence of acute intracranial abnormality ECHO 6/19 >> LVEF 50-55%, normal wall motion, no RWMA, previous RA mass no longer demonstrated on this ECHO, mild increase in PA systolic pressure  CULTURES: BCx2 6/20 >>  Sputum 6/21 >> neg  ANTIBIOTICS: Perioperative Ancef 6/20 Zosyn >> 6/21 Vancomycin >> 6/23  SIGNIFICANT EVENTS: 6/17  Admit after fall with L hip fx,  L HIP replacement, extubated, developed abd pain found to have intraabdominal hematoma.  6/17  AF w/ CE elevation so ECHO checked-->round  RA thrombus. Started on heparin. Cards, CVTS and IR involved in care. Later that evening increased resp distress. FIO2 req increased. CT chest w/ bilateral PE 6/18  remaining hypoxic. Hgb stable, repeat ECHO obtained and consideration for EKOS.  PEA Arrest 6/20  On neo 20-80, sedation, heparin.  No weaning.  Anasarca 6/21 drop in HgB, 2 units of PRBC, ordered IVC filter, Heparin gtt stopped  LINES/TUBES: Foley 6/16  PIV ETT 6/16-->6/17 >> 6/18 >> 6/23 L [place] DLC 6/16 >>  DISCUSSION: Ms. Dhaliwal is a 83F who remains intubated after a complicated L hip replacement with arterial avulsion of the left external iliac artery requiring covered stent placement by IR c/b intraabdominal / pelvic hematoma, hgb has been stable. Course complicated by increased CEs, AF, ECHO w/ RA thrombus, later c/b PE, and hypoxic resp failure. She was placed on heparin and multiple specialties have been involved.  6/18 am RV fxn worse and  there is still large clot in the RA. 6/18  underwent catheter directed lysis per IR.  She later developed a drop in blood pressure while on TPA and it was discontinued.  Case was discussed with [facility] and she was deemed not a surgical candidate.  The patient was stabilized and went for repeat CT of abd/pelvis which showed increased left thigh hematoma, no increase in pelvic hemorrhage.   Follow up ECHO on 6/19 did not show atrial mass. TPA stopped and heparin gtt initiated. 6/21 Drop in HgB, Heparin gtt stopped, IVC filter placed.    ASSESSMENT / PLAN:  PULMONARY A: Acute Hypoxic Respiratory Failure  Mild Pulmonary edema  Acute bilateral PE-->RV strain has increased but not sure that this is resp for her resp failure   P:   O2 to support sats > 92% Pulmonary hygiene - IS, mobilize as able  Heparin stopped 2/2 decrease in HgB, IVC filter placed Trend CXR  CARDIOVASCULAR A:  Troponin elevation -->likely demand ischemia  Heart murmur Intermittent AF Severe MR RA clott;  acute PE  (likely migrated)-->appreciate cards, thoracic and interventional assist.  Repeat echo 6/19 without a clot. PEA Arrest - 6/18, approx 10 minutes Shock - resolved 6/21 P:  Heparin gtt stopped 2/2 decreased in HgB Continue cardizem for AF Hemodynamic monitoring in ICU  RENAL A:   Metabolic Acidosis Hypophosphatemia  P:   BMET in AM. Repeat BMP at 1700  Replace electrolytes as indicated. D/C lasix gtt this PM, stop time in place  GASTROINTESTINAL A:   Concern for abdominal compartment syndrome--> no evidence of active bleeding  At Risk Protein Calorie Malnutrition P:   Continue TF. Famotidine for ppx.  HEMATOLOGIC A:   Arterial hemorrhage s/p stenting and requiring massive transfusion propofol RA thrombus-->likely propagated from right pelvic vein compression from hematoma-->migrated and now with BILATERAL PE P:  Trend CBC. Transfuse per ICU protocol. Heparin stopped 2/2 decrease in HgB.  INFECTIOUS A:   No acute issues - leukocytosis likely stress response Fever - new 6/21, abx initiated S/p periop Ancef P:   Zosyn for total of 8 days (stop day in place). F/U cultures.  ENDOCRINE A:   No acute issues  P:   Trend glucose on chemistries   NEUROLOGIC A:   Anxiety and post-op pain  P:   RASS goal: -1. Fent drip for pain. PRN versed. Una boots for foot drop prevention.  FAMILY  - Updates:   No family at bedside am 6/24   Noe Gens, NP-C [facility] Pgr: [phone] or if no answer [phone] 01/03/2016, 8:47 AM   Attending Note:  I have examined patient, reviewed labs, studies and notes. I have discussed the case with B Ollis, and I agree with the data and plans as amended above.  75 yo woman with complicated course following his fx, iliac arterial injury and hemorrhagic shock. Acute B PE C/b cardiac arrest.  Now extubated. Remains in A Fib and on dilt gtt. On my eval she is weak, has coarse B breath sounds. She has a weak cough but is  able to move some secretions - not fully. HR currently on 110's on dilt 15. We will plan to continue current abx, continue efforts at diuresis as BP and renal fxn will tolerate. Wean dilt as able - will start enteral meds when passes swallowing eval.  Independent critical care time is 35 minutes.   Baltazar Apo, MD, PhD 01/03/2016, 11:24 AM [facility] [phone]  or if no answer [phone]

## 2016-01-03 NOTE — Progress Notes (Signed)
SLP Cancellation Note  Patient Details Name: KELVIN BUSTILLOS MRN: [id number] DOB: 15-Aug-1940   Cancelled treatment:       Reason Eval/Treat Not Completed: Patient not medically ready  Per nursing the patient was extubated yesterday and is just now starting to clear her secretions given cues.  Nursing is requesting that we hold swallow evaluation for today.  ST will follow up next date.    Shelly Flatten, MA, [facility] SLP [phone] Lamar Sprinkles 01/03/2016, 11:58 AM

## 2016-01-04 ENCOUNTER — Inpatient Hospital Stay (HOSPITAL_COMMUNITY): Payer: Medicare HMO

## 2016-01-04 LAB — BASIC METABOLIC PANEL
Anion gap: 17 — ABNORMAL HIGH (ref 5–15)
Anion gap: 11 (ref 5–15)
BUN: 63 mg/dL — ABNORMAL HIGH (ref 6–20)
BUN: 70 mg/dL — AB (ref 6–20)
CALCIUM: 10.5 mg/dL — AB (ref 8.9–10.3)
CHLORIDE: 95 mmol/L — AB (ref 101–111)
CHLORIDE: 104 mmol/L (ref 101–111)
CO2: 30 mmol/L (ref 22–32)
CO2: 29 mmol/L (ref 22–32)
CREATININE: 1.34 mg/dL — AB (ref 0.44–1.00)
Calcium: 10.2 mg/dL (ref 8.9–10.3)
Creatinine, Ser: 1.21 mg/dL — ABNORMAL HIGH (ref 0.44–1.00)
GFR calc Af Amer: 50 mL/min — ABNORMAL LOW (ref >60)
GFR, EST AFRICAN AMERICAN: 44 mL/min — AB (ref >60)
GFR, EST NON AFRICAN AMERICAN: 38 mL/min — AB (ref >60)
GFR, EST NON AFRICAN AMERICAN: 43 mL/min — AB (ref >60)
GLUCOSE: 173 mg/dL — AB (ref 65–99)
Glucose, Bld: 126 mg/dL — ABNORMAL HIGH (ref 65–99)
POTASSIUM: 3 mmol/L — AB (ref 3.5–5.1)
Potassium: 2.2 mmol/L — CL (ref 3.5–5.1)
SODIUM: 142 mmol/L (ref 135–145)
SODIUM: 144 mmol/L (ref 135–145)

## 2016-01-04 LAB — CULTURE, BLOOD (ROUTINE X 2)
CULTURE: NO GROWTH
Culture: NO GROWTH

## 2016-01-04 LAB — CBC
HCT: 34.9 % — ABNORMAL LOW (ref 36.0–46.0)
HEMOGLOBIN: 11 g/dL — AB (ref 12.0–15.0)
MCH: 29.4 pg (ref 26.0–34.0)
MCHC: 31.5 g/dL (ref 30.0–36.0)
MCV: 93.3 fL (ref 78.0–100.0)
PLATELETS: 234 10*3/uL (ref 150–400)
RBC: 3.74 MIL/uL — ABNORMAL LOW (ref 3.87–5.11)
RDW: 18 % — ABNORMAL HIGH (ref 11.5–15.5)
WBC: 20.5 10*3/uL — ABNORMAL HIGH (ref 4.0–10.5)

## 2016-01-04 LAB — GLUCOSE, CAPILLARY
GLUCOSE-CAPILLARY: 136 mg/dL — AB (ref 65–99)
GLUCOSE-CAPILLARY: 140 mg/dL — AB (ref 65–99)
GLUCOSE-CAPILLARY: 144 mg/dL — AB (ref 65–99)
GLUCOSE-CAPILLARY: 150 mg/dL — AB (ref 65–99)
Glucose-Capillary: 132 mg/dL — ABNORMAL HIGH (ref 65–99)
Glucose-Capillary: 169 mg/dL — ABNORMAL HIGH (ref 65–99)

## 2016-01-04 LAB — PHOSPHORUS
PHOSPHORUS: 1.5 mg/dL — AB (ref 2.5–4.6)
Phosphorus: 3.1 mg/dL (ref 2.5–4.6)

## 2016-01-04 LAB — MAGNESIUM
MAGNESIUM: 2.9 mg/dL — AB (ref 1.7–2.4)
Magnesium: 1.3 mg/dL — ABNORMAL LOW (ref 1.7–2.4)

## 2016-01-04 LAB — LACTIC ACID, PLASMA: LACTIC ACID, VENOUS: 1.3 mmol/L (ref 0.5–2.0)

## 2016-01-04 MED ORDER — PRO-STAT SUGAR FREE PO LIQD
30.0000 mL | Freq: Two times a day (BID) | ORAL | Status: DC
  Administered 2016-01-04 – 2016-01-05 (×3): 30 mL
  Filled 2016-01-04 (×4): qty 30

## 2016-01-04 MED ORDER — VITAL HIGH PROTEIN PO LIQD
1000.0000 mL | ORAL | Status: DC
  Administered 2016-01-04: 1000 mL
  Filled 2016-01-04: qty 1000

## 2016-01-04 MED ORDER — POTASSIUM CHLORIDE 20 MEQ/15ML (10%) PO SOLN
20.0000 meq | Freq: Once | ORAL | Status: AC
  Administered 2016-01-04: 20 meq via ORAL
  Filled 2016-01-04: qty 15

## 2016-01-04 MED ORDER — METOPROLOL TARTRATE 5 MG/5ML IV SOLN
5.0000 mg | INTRAVENOUS | Status: DC | PRN
  Administered 2016-01-04: 5 mg via INTRAVENOUS
  Filled 2016-01-04: qty 5

## 2016-01-04 MED ORDER — POTASSIUM CHLORIDE 10 MEQ/50ML IV SOLN
10.0000 meq | INTRAVENOUS | Status: DC
  Administered 2016-01-04 (×6): 10 meq via INTRAVENOUS
  Filled 2016-01-04 (×6): qty 50

## 2016-01-04 NOTE — Evaluation (Signed)
Clinical/Bedside Swallow Evaluation Patient Details  Name: Bonnie Salazar MRN: [id number] Date of Birth: 02-10-1941  Today's Date: 01/04/2016 Time: SLP Start Time (ACUTE ONLY): 1430 SLP Stop Time (ACUTE ONLY): 1445 SLP Time Calculation (min) (ACUTE ONLY): 15 min  Past Medical History:  Past Medical History  Diagnosis Date  . Medical history non-contributory   . Acute pulmonary embolism ([place]) 12/28/2015   Past Surgical History:  Past Surgical History  Procedure Laterality Date  . Back surgery    . 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 female without significant PMH, admitted 6/15 after sustaining a fall with L femoral neck fracture. Pt was intubated 6/16 for surgery, extubated 6/17. On 6/18 pt with sudden drop in BP/ back pain while on TPA, then PEA arrest and required resuscitation and was re-intubated, extubated 6/23. Noted to have strong cough and able to speak name. Most recent CXR showed persistent atelectasis versus consolidation LLL. Bedside swallow eval ordered due to prolonged/ multiple intubations.    Assessment / Plan / Recommendation Clinical Impression  Pt presenting with an acute reversible dysphagia following 5-day intubation. Pt showing immediate s/s of aspiration (cough) following trial of puree consistency. Pt also with weak vocal quality/ cough. Pt was able to follow 1-step directions and answer simple questions but required extra processing time to respond. Given these findings, pt currently with decreased airway protection and is at high risk of aspiration. Recommend continuing NPO status, meds via alternative means. Will continue to follow for PO readiness.    Aspiration Risk  Severe aspiration risk    Diet Recommendation NPO   Medication Administration: Via alternative means    Other   Recommendations Oral Care Recommendations: Oral care QID Other Recommendations: Have oral suction available   Follow up Recommendations  Other (comment) (TBD)    Frequency and Duration min 2x/week  2 weeks       Prognosis Prognosis for Safe Diet Advancement: Good      Swallow Study   General HPI: Pt is a 75 y.o. female without significant PMH, admitted 6/15 after sustaining a fall with L femoral neck fracture. Pt was intubated 6/16 for surgery, extubated 6/17. On 6/18 pt with sudden drop in BP/ back pain while on TPA, then PEA arrest and required resuscitation and was re-intubated, extubated 6/23. Noted to have strong cough and able to speak name. Most recent CXR showed persistent atelectasis versus consolidation LLL. Bedside swallow eval ordered due to prolonged/ multiple intubations.  Type of Study: Bedside Swallow Evaluation Diet Prior to this Study: NPO Temperature Spikes Noted: Yes Respiratory Status: Nasal cannula History of Recent Intubation: Yes Length of Intubations (days): 5 days Date extubated: 01/02/16 Behavior/Cognition: Alert;Cooperative;Requires cueing Oral Cavity Assessment: Within Functional Limits Oral Care Completed by SLP: Yes Oral Cavity - Dentition: Adequate natural dentition Self-Feeding Abilities: Needs assist Patient Positioning: Upright in bed Baseline Vocal Quality: Low vocal intensity;Breathy Volitional Cough: Weak Volitional Swallow: Able to elicit    Oral/Motor/Sensory Function Overall Oral Motor/Sensory Function: Within functional limits   Ice Chips Ice chips: Within functional limits Presentation: Spoon   Thin Liquid Thin Liquid: Not tested    Nectar Thick Nectar Thick Liquid: Not tested   Honey Thick Honey Thick Liquid: Not tested   Puree Puree: Impaired Presentation: Spoon Pharyngeal Phase Impairments: Cough - Immediate;Multiple swallows  Solid   GO   Solid: Not tested        Kern Reap, MA, CCC-SLP 01/04/2016,2:55  PM [phone]

## 2016-01-04 NOTE — Progress Notes (Signed)
Patient HR in the 150's but asymptomatic. NP called.

## 2016-01-04 NOTE — Progress Notes (Signed)
PULMONARY / CRITICAL CARE MEDICINE   Name: HAELIE THERIOT MRN: [id number] DOB: 26-Dec-1940    ADMISSION DATE:  12/25/2015 CONSULTATION DATE:  12/26/15  REFERRING MD:  Trauma service  CHIEF COMPLAINT:  Hip fracture; need for mechanical ventilation post-operatively  HISTORY OF PRESENT ILLNESS:  Ms. Lineberger is a 75F who presented 12/25/15 after an fall at home with resultant left femoral neck fracture. She was taken to the OR for operative repair 6/16. The initial repair appeared uneventful and she was extubated in the PACU. Shortly thereafter, she complained of abdominal pain and was noted to have a tense abdomen. CT scan showed extravasation into the LLQ; she was taken to IR and underwent covered stent of the distal external iliac artery to the inguinal ligament. Vascular surgery was also consulted. No signs of distal ischemia on his exam. There is some concern for abdominal compartment syndrome, as well. MTP was initiated and she received 11u pRBC, 7FFP, 2 PLT pheresis, 2 cryoprecipitate.  Returned to ICU on mechanical ventilation.  She suffered a PEA arrest 6/18.  ICU course complicated by further bleeding.  IVC filter placed.   SUBJECTIVE:  Off lasix gtt, net neg 2.6L in last 24 hours.  Off cardizem since 0230, in [place].  Tmax 100.2.  RN concerned about pt's affect, risk of aspiration.   VITAL SIGNS: BP 138/72 mmHg  Pulse 102  Temp(Src) 100.2 F (37.9 C) (Oral)  Resp 31  Ht 5\' 7"  (1.702 m)  Wt 147 lb 7.8 oz (66.9 kg)  BMI 23.09 kg/m2  SpO2 94%  HEMODYNAMICS:    VENTILATOR SETTINGS:    INTAKE / OUTPUT:  Intake/Output Summary (Last 24 hours) at 01/04/16 0913 Last data filed at 01/04/16 0802  Gross per 24 hour  Intake 1145.15 ml  Output   3925 ml  Net -2779.85 ml   PHYSICAL EXAMINATION:  General Ill appearing female in NAD  HEENT [place]/AT, PERRLA, EOM-I and MMM.  Pulmonary Non-labored, diminished bilaterally, basilar crackles  Cardiovascular Regular; II/VI diastolic heart  murmur.  Distal pulses palpable.  Abdomen Distended, hypoactive bowel sounds.  Musculoskeletal L hip post-surgical changes / dressing in place, mild ecchymosis  Lymphatics No cervical, supraclavicular or axillary adenopathy.   Neurologic Grossly intact. No focal deficits. Generalized weakness, flat affect, weak voice, follows commands but very weak  Skin/Integuement No rash, no cyanosis, no clubbing. L [place] CVC in place. LLE edematous / generalized anasarca   LABS:  BMET  Recent Labs Lab 01/03/16 0449 01/03/16 1758 01/04/16 0400  NA 137 139 142  K 2.5* 2.2* 2.2*  CL 83* 89* 95*  CO2 35* 32 30  BUN 43* 54* 63*  CREATININE 1.18* 1.26* 1.34*  GLUCOSE 149* 134* 126*   Electrolytes  Recent Labs Lab 01/01/16 0434  01/02/16 0420 01/02/16 1803 01/03/16 0449 01/03/16 1758 01/04/16 0400  CALCIUM 8.4*  < > 9.3 10.2 10.8* 10.4* 10.5*  MG  --   < > 2.1 2.1 2.3  --   --   PHOS 2.1*  --  1.4*  --  2.8  --   --   < > = values in this interval not displayed. CBC  Recent Labs Lab 01/02/16 0420 01/03/16 0449 01/04/16 0400  WBC 12.5* 15.3* 20.5*  HGB 8.1* 9.4* 11.0*  HCT 24.6* 29.1* 34.9*  PLT 100* 147* 234   Coag's  Recent Labs Lab 12/31/15 0951  APTT 33  INR 1.30     Sepsis Markers  Recent Labs Lab 01/01/16 1100 01/02/16 0420 01/03/16  0449  PROCALCITON 0.59 0.53 0.53   ABG  Recent Labs Lab 01/01/16 1153 01/02/16 0327 01/03/16 0345  PHART 7.402 7.533* 7.567*  PCO2ART 50.5* 45.5* 40.5  PO2ART 86.6 93.3 77.8*   Liver Enzymes  Recent Labs Lab 12/29/15 0306 12/31/15 0400 01/01/16 0434  AST 52* 60*  --   ALT 21 24  --   ALKPHOS 48 77  --   BILITOT 0.6 1.1  --   ALBUMIN 2.5* 2.1* 3.4*   Cardiac Enzymes  Recent Labs Lab 01/02/16 1803 01/03/16 0449  TROPONINI 0.26* 0.23*   Glucose  Recent Labs Lab 01/03/16 1156 01/03/16 1605 01/03/16 1950 01/04/16 01/04/16 0359 01/04/16 0743  GLUCAP 154* 174* 129* 140* 132* 150*   Imaging Dg Chest Port  1 View  01/04/2016  CLINICAL DATA:  Acute respiratory failure EXAM: PORTABLE CHEST 1 VIEW COMPARISON:  Portable exam [id number] hours compared 01/03/2016 FINDINGS: RIGHT subclavian central venous catheter with tip projecting over SVC. Borderline enlargement of cardiac silhouette. Mediastinal contours and pulmonary vascularity normal. Slight rotation to the RIGHT. Atelectasis versus consolidation no fluid. Remaining lungs clear. No pleural effusion or pneumothorax. IMPRESSION: Persistent atelectasis versus consolidation LEFT lower lobe. Electronically Signed   By: Lavonia Dana M.D.   On: 01/04/2016 08:34     STUDIES:  ECHO 6/17 >> Left ventricle: The cavity size was normal. Systolic function was normal. The estimated ejection fraction was in the range of 50% to 55%. Wall motion was normal; there were no regional wall motion abnormalities.- Mitral valve: Moderate prolapse. There was severe regurgitation. - Left atrium: The atrium was mildly dilated.- Right ventricle: The cavity size was mildly dilated. Wall thickness was normal.- Right atrium: There is a large mobile mass in the right atrium  that prolapses toward the mitral valve. CT chest 6/17 >> Bilateral pulmonary emboli, nearly occlusive in the left lower lobe segmental branches, involving all of the segmental pulmonary arteries. Evidence of right heart strain with right/left heart ratio of over 1. Heterogeneous area of hypoattenuation within the right atrium may represent right atrial clot versus mixing of non-opacified blood from the inferior vena cava. Bilateral pleural effusions and atelectatic changes of bilateral lung bases. LE [place] 6/17 >> negative for DVT ECHO 6/18 >> LVEF 55-60%, normal wall motion, moderate MVP, mod to severe MR, RA mildly dilated, RA with large moblie pedunculated and irregular mass that prolapses through the TV c/w thrombus CT ABD/Pelvis 6/18 >> no evidence of new intraperitoneal or pelvic hemorrhage, increasing hematoma in left  proximal thigh, decreasing volume of pulmonary embolus in the bilateral lower lobes CT Head 6/18 >> No CT evidence of acute intracranial abnormality ECHO 6/19 >> LVEF 50-55%, normal wall motion, no RWMA, previous RA mass no longer demonstrated on this ECHO, mild increase in PA systolic pressure  CULTURES: BCx2 6/20 >>  Sputum 6/21 >> neg  ANTIBIOTICS: Perioperative Ancef 6/20 Zosyn >> 6/21 Vancomycin >> 6/23  SIGNIFICANT EVENTS: 6/17  Admit after fall with L hip fx,  L HIP replacement, extubated, developed abd pain found to have intraabdominal hematoma.  6/17  AF w/ CE elevation so ECHO checked-->round RA thrombus. Started on heparin. Cards, CVTS and IR involved in care. Later that evening increased resp distress. FIO2 req increased. CT chest w/ bilateral PE 6/18  remaining hypoxic. Hgb stable, repeat ECHO obtained and consideration for EKOS.  PEA Arrest 6/20  On neo 20-80, sedation, heparin.  No weaning.  Anasarca 6/21 drop in HgB, 2 units of PRBC, ordered IVC filter, Heparin  gtt stopped  LINES/TUBES: Foley 6/16  PIV ETT 6/16-->6/17 >> 6/18 >> 6/23 L [place] DLC 6/16 >>  DISCUSSION: Ms. Deshane is a 62F who remains intubated after a complicated L hip replacement with arterial avulsion of the left external iliac artery requiring covered stent placement by IR c/b intraabdominal / pelvic hematoma, hgb has been stable. Course complicated by increased CEs, AF, ECHO w/ RA thrombus, later c/b PE, and hypoxic resp failure. She was placed on heparin and multiple specialties have been involved.  6/18 am RV fxn worse and there is still large clot in the RA. 6/18  underwent catheter directed lysis per IR.  She later developed a drop in blood pressure while on TPA and it was discontinued.  Case was discussed with [facility] and she was deemed not a surgical candidate.  The patient was stabilized and went for repeat CT of abd/pelvis which showed increased left thigh hematoma, no increase in pelvic hemorrhage.    Follow up ECHO on 6/19 did not show atrial mass. TPA stopped and heparin gtt initiated. 6/21 Drop in HgB, Heparin gtt stopped, IVC filter placed.    ASSESSMENT / PLAN:  PULMONARY A: Acute Hypoxic Respiratory Failure  Mild Pulmonary edema  Acute bilateral PE-->RV strain has increased but not sure that this is resp for her resp failure   P:   O2 to support sats > 92% Pulmonary hygiene - IS, mobilize as able  Heparin stopped 2/2 decrease in HgB, IVC filter placed Trend CXR  CARDIOVASCULAR A:  Troponin elevation -->likely demand ischemia  Heart murmur Intermittent AF Severe MR RA clott;  acute PE (likely migrated)-->appreciate cards, thoracic and interventional assist.  Repeat echo 6/19 without a clot. PEA Arrest - 6/18, approx 10 minutes Shock - resolved 6/21 P:  Heparin gtt stopped 2/2 decreased in HgB Cardizem gtt discontinued overnight 6/25.  Currently in SR.   Monitor rhythm Hemodynamic monitoring in ICU  RENAL A:   AG Metabolic Acidosis Hypokalemia Hypophosphatemia  P:   BMET in AM. Replace electrolytes as indicated. Monitor off lasix  Repeat lactic acid 6/25 with AG  GASTROINTESTINAL A:   Concern for abdominal compartment syndrome--> no evidence of active bleeding  At Risk Protein Calorie Malnutrition P:   Insert [place]  Begin TF for nutritional support SLP for swallowing evaluation  Famotidine for ppx.  HEMATOLOGIC A:   Arterial hemorrhage s/p stenting and requiring massive transfusion propofol RA thrombus-->likely propagated from right pelvic vein compression from hematoma-->migrated and now with BILATERAL PE P:  Trend CBC. Transfuse per ICU protocol. Heparin stopped 2/2 decrease in HgB.  INFECTIOUS A:   No acute issues - leukocytosis likely stress response Fever - new 6/21, abx initiated S/p periop Ancef P:   Zosyn for total of 8 days (stop date in place). F/U cultures.  ENDOCRINE A:   No acute issues  P:   Trend glucose on chemistries   SSI   NEUROLOGIC A:   Anxiety and post-op pain  P:   RASS goal: 0 Minimize sedating medications Space boots for foot drop prevention.  FAMILY  - Updates:   Family updated 6/25 at bedside  Consider tx to SDU late PM or in am.      Noe Gens, NP-C [facility] Pgr: [phone] or if no answer [phone] 01/04/2016, 9:13 AM   Attending Note:  I have examined patient, reviewed labs, studies and notes. I have discussed the case with B Ollis, and I agree with the data and plans  as amended above. 75 yo woman with complicated course following his fx, iliac arterial injury and hemorrhagic shock. Acute B PE C/b cardiac arrest, off anticoag and with IVC filter. Now extubated.  On my eval she is weak and minimally interactive although awake, has coarse B breath sounds. She is back in NSR and dilt has been stopped.  She has a weak cough. We will plan to continue current abx, continue efforts at diuresis as BP and renal fxn will tolerate. Repeat swallowing eval.    Baltazar Apo, MD, PhD 01/04/2016, 1:27 PM [facility] [phone] or if no answer [phone]

## 2016-01-04 NOTE — Progress Notes (Signed)
[facility] Progress Note Patient Name: Bonnie Salazar DOB: Jan 22, 1941 MRN: [id number]   Date of Service  01/04/2016  HPI/Events of Note  Notified of persistent hypokalemia. Potassium 2.2. Serum creatinine 1.34. Has Central IV access.   eICU Interventions  1. KCl 10 mEq IV 8 runs 2. BMP at 1300 hrs. today      Intervention Category Major Interventions: Electrolyte abnormality - evaluation and management  Tera Partridge 01/04/2016, 4:42 AM

## 2016-01-04 NOTE — Evaluation (Signed)
Physical Therapy Evaluation Patient Details Name: Bonnie Salazar MRN: [id number] DOB: 1940/10/17 Today's Date: 01/04/2016   History of Present Illness  75 y.o. female admitted to [facility] on 12/25/15 s/p fall with resultant L hip fx s/p L direct anterior THA, WBAT. Pt was extubated in the PACU. Shortly thereafter, she complained of abdominal pain and was noted to have a tense abdomen. CT scan showed extravasation into the LLQ; she was taken to IR and underwent covered stent of the distal external iliac artery to the inguinal ligament. Vascular surgery was also consulted. No signs of distal ischemia on his exam. There is some concern for abdominal compartment syndrome, as well. MTP was initiated and she received 11u pRBC, 7FFP, 2 PLT pheresis, 2 cryoprecipitate. Returned to ICU on mechanical ventilation. She suffered a PEA arrest 6/18. ICU course complicated by further bleeding. IVC filter placed. Intubated 6/16-6/17/17 and 6/18-6/23/17. Pt with significant PMHx of back surgery listed in chart. bil PE found on CT 6/17.   Clinical Impression  Pt was able to tolerate sitting EOB with me today.  HR 80s-110 (RN reports just giving HR control meds).  Although very weak, she tolerated EOB and ROM exercises well.  It will likely take two people to get her on her feet safely due to both lines and her weakness.  I anticipate as she medically stabilizes we will be able to try more in consecutive sessions.  CIR screen put in as I believe pt could be an excellent inpatient rehab candidate.   PT to follow acutely for deficits listed below.       Follow Up Recommendations CIR    Equipment Recommendations  Rolling walker with 5" wheels;3in1 (PT)    Recommendations for Other Services Rehab consult     Precautions / Restrictions Precautions Precautions: Fall Restrictions Weight Bearing Restrictions: No LLE Weight Bearing: Weight bearing as tolerated      Mobility  Bed Mobility Overal bed mobility: Needs  Assistance Bed Mobility: Supine to Sit;Sit to Supine     Supine to sit: Max assist Sit to supine: Max assist   General bed mobility comments: Max assist to support trunk and help progress bil legs to EOB.  Pt with difficulty initiating movement to command to move EOB, however, once therapist initiated, pt did try to help minimally.   Transfers                 General transfer comment: NT today due to safety and fatigue         Balance Overall balance assessment: Needs assistance Sitting-balance support: Feet supported;Bilateral upper extremity supported Sitting balance-Leahy Scale: Poor Sitting balance - Comments: Min to mod assist EOB to support trunk Postural control: Posterior lean                                   Pertinent Vitals/Pain Pain Assessment: Faces Faces Pain Scale: Hurts even more Pain Location: seated EOB in left hip Pain Descriptors / Indicators: Grimacing;Guarding Pain Intervention(s): Limited activity within patient's tolerance;Monitored during session;Repositioned    Home Living Family/patient expects to be discharged to:: Private residence Living Arrangements: Spouse/significant other (retired) Available Help at Discharge: Family;Available 24 hours/day             Additional Comments: Difficult hx due to low tone of voice and slow processing.    Prior Function Level of Independence: Independent  Comments: per pt she did not use a RW or cane PTA        Extremity/Trunk Assessment   Upper Extremity Assessment: Generalized weakness           Lower Extremity Assessment: Generalized weakness      Cervical / Trunk Assessment: Kyphotic  Communication   Communication: Other (comment) (low tone)  Cognition Arousal/Alertness: Awake/alert Behavior During Therapy: Flat affect Overall Cognitive Status: Impaired/Different from baseline Area of Impairment: Memory;Problem solving;Following commands      Memory: Decreased short-term memory (unable to tell me about her fall) Following Commands: Follows one step commands with increased time;Follows one step commands inconsistently     Problem Solving: Slow processing;Decreased initiation;Difficulty sequencing;Requires verbal cues;Requires tactile cues         Exercises Total Joint Exercises Ankle Circles/Pumps: AROM;Both;20 reps Heel Slides: AAROM;Both;10 reps Hip ABduction/ADduction: AAROM;Both;10 reps Long Arc Quad: AROM;Both;10 reps;Seated      Assessment/Plan    PT Assessment Patient needs continued PT services  PT Diagnosis Difficulty walking;Abnormality of gait;Generalized weakness;Acute pain;Altered mental status   PT Problem List Decreased strength;Decreased activity tolerance;Decreased range of motion;Decreased balance;Decreased mobility;Decreased cognition;Decreased knowledge of use of DME;Decreased safety awareness;Decreased knowledge of precautions;Pain  PT Treatment Interventions DME instruction;Gait training;Stair training;Therapeutic activities;Functional mobility training;Therapeutic exercise;Balance training;Neuromuscular re-education;Cognitive remediation;Patient/family education;Manual techniques;Modalities   PT Goals (Current goals can be found in the Care Plan section) Acute Rehab PT Goals Patient Stated Goal: none stated PT Goal Formulation: Patient unable to participate in goal setting Time For Goal Achievement: 01/18/16 Potential to Achieve Goals: Good    Frequency Min 5X/week        End of Session Equipment Utilized During Treatment: Oxygen Activity Tolerance: Patient limited by fatigue;Patient limited by pain Patient left: in bed;with call bell/phone within reach;Other (comment) (with SLP in room assessing swallow function) Nurse Communication: Mobility status        Time: [id number] PT Time Calculation (min) (ACUTE ONLY): 25 min   Charges:   PT Evaluation $PT Eval High Complexity: 1  Procedure PT Treatments $Therapeutic Activity: 8-22 mins        [place]ia Witz B. [place], [place], DPT [phone]   01/04/2016, 3:06 PM

## 2016-01-05 ENCOUNTER — Inpatient Hospital Stay (HOSPITAL_COMMUNITY): Payer: Medicare HMO

## 2016-01-05 DIAGNOSIS — I2692 Saddle embolus of pulmonary artery without acute cor pulmonale: Secondary | ICD-10-CM

## 2016-01-05 DIAGNOSIS — I5189 Other ill-defined heart diseases: Secondary | ICD-10-CM | POA: Diagnosis present

## 2016-01-05 DIAGNOSIS — R222 Localized swelling, mass and lump, trunk: Secondary | ICD-10-CM

## 2016-01-05 DIAGNOSIS — I2699 Other pulmonary embolism without acute cor pulmonale: Secondary | ICD-10-CM | POA: Diagnosis present

## 2016-01-05 DIAGNOSIS — R58 Hemorrhage, not elsewhere classified: Secondary | ICD-10-CM

## 2016-01-05 LAB — GLUCOSE, CAPILLARY
GLUCOSE-CAPILLARY: 169 mg/dL — AB (ref 65–99)
GLUCOSE-CAPILLARY: 193 mg/dL — AB (ref 65–99)
GLUCOSE-CAPILLARY: 172 mg/dL — AB (ref 65–99)
GLUCOSE-CAPILLARY: 180 mg/dL — AB (ref 65–99)
GLUCOSE-CAPILLARY: 137 mg/dL — AB (ref 65–99)
Glucose-Capillary: 135 mg/dL — ABNORMAL HIGH (ref 65–99)
Glucose-Capillary: 170 mg/dL — ABNORMAL HIGH (ref 65–99)

## 2016-01-05 LAB — CBC
HCT: 35.3 % — ABNORMAL LOW (ref 36.0–46.0)
HEMATOCRIT: 36.1 % (ref 36.0–46.0)
Hemoglobin: 11 g/dL — ABNORMAL LOW (ref 12.0–15.0)
Hemoglobin: 11 g/dL — ABNORMAL LOW (ref 12.0–15.0)
MCH: 29.6 pg (ref 26.0–34.0)
MCH: 29.2 pg (ref 26.0–34.0)
MCHC: 31.2 g/dL (ref 30.0–36.0)
MCHC: 30.5 g/dL (ref 30.0–36.0)
MCV: 95.1 fL (ref 78.0–100.0)
MCV: 95.8 fL (ref 78.0–100.0)
PLATELETS: 246 10*3/uL (ref 150–400)
PLATELETS: 257 10*3/uL (ref 150–400)
RBC: 3.71 MIL/uL — ABNORMAL LOW (ref 3.87–5.11)
RBC: 3.77 MIL/uL — AB (ref 3.87–5.11)
RDW: 18.7 % — AB (ref 11.5–15.5)
RDW: 19 % — ABNORMAL HIGH (ref 11.5–15.5)
WBC: 18.7 10*3/uL — AB (ref 4.0–10.5)
WBC: 20.1 10*3/uL — AB (ref 4.0–10.5)

## 2016-01-05 LAB — BASIC METABOLIC PANEL
ANION GAP: 11 (ref 5–15)
Anion gap: 11 (ref 5–15)
BUN: 84 mg/dL — AB (ref 6–20)
BUN: 90 mg/dL — ABNORMAL HIGH (ref 6–20)
CALCIUM: 10.2 mg/dL (ref 8.9–10.3)
CHLORIDE: 105 mmol/L (ref 101–111)
CO2: 30 mmol/L (ref 22–32)
CO2: 28 mmol/L (ref 22–32)
CREATININE: 1.07 mg/dL — AB (ref 0.44–1.00)
Calcium: 10.4 mg/dL — ABNORMAL HIGH (ref 8.9–10.3)
Chloride: 112 mmol/L — ABNORMAL HIGH (ref 101–111)
Creatinine, Ser: 1.09 mg/dL — ABNORMAL HIGH (ref 0.44–1.00)
GFR calc Af Amer: 58 mL/min — ABNORMAL LOW (ref >60)
GFR calc non Af Amer: 50 mL/min — ABNORMAL LOW (ref >60)
GFR, EST AFRICAN AMERICAN: 57 mL/min — AB (ref >60)
GFR, EST NON AFRICAN AMERICAN: 49 mL/min — AB (ref >60)
Glucose, Bld: 168 mg/dL — ABNORMAL HIGH (ref 65–99)
Glucose, Bld: 123 mg/dL — ABNORMAL HIGH (ref 65–99)
POTASSIUM: 3 mmol/L — AB (ref 3.5–5.1)
Potassium: 2 mmol/L — CL (ref 3.5–5.1)
SODIUM: 151 mmol/L — AB (ref 135–145)
Sodium: 146 mmol/L — ABNORMAL HIGH (ref 135–145)

## 2016-01-05 LAB — MAGNESIUM
MAGNESIUM: 2.9 mg/dL — AB (ref 1.7–2.4)
Magnesium: 3 mg/dL — ABNORMAL HIGH (ref 1.7–2.4)

## 2016-01-05 LAB — PHOSPHORUS
Phosphorus: 3 mg/dL (ref 2.5–4.6)
Phosphorus: 2.1 mg/dL — ABNORMAL LOW (ref 2.5–4.6)

## 2016-01-05 MED ORDER — VITAL AF 1.2 CAL PO LIQD
1000.0000 mL | ORAL | Status: DC
  Administered 2016-01-05 – 2016-01-12 (×9): 1000 mL
  Filled 2016-01-05 (×3): qty 1000
  Filled 2016-01-05: qty 237
  Filled 2016-01-05 (×14): qty 1000

## 2016-01-05 MED ORDER — POTASSIUM CHLORIDE 20 MEQ/15ML (10%) PO SOLN
40.0000 meq | ORAL | Status: AC
  Administered 2016-01-05 (×2): 40 meq
  Filled 2016-01-05 (×3): qty 30

## 2016-01-05 MED ORDER — POTASSIUM CHLORIDE 20 MEQ/15ML (10%) PO SOLN
60.0000 meq | Freq: Once | ORAL | Status: AC
  Administered 2016-01-05: 60 meq
  Filled 2016-01-05: qty 45

## 2016-01-05 MED ORDER — POTASSIUM CHLORIDE 10 MEQ/50ML IV SOLN
10.0000 meq | INTRAVENOUS | Status: AC
  Administered 2016-01-05 (×6): 10 meq via INTRAVENOUS
  Filled 2016-01-05 (×6): qty 50

## 2016-01-05 MED ORDER — SODIUM CHLORIDE 0.45 % IV SOLN
INTRAVENOUS | Status: DC
  Administered 2016-01-05: 10:00:00 via INTRAVENOUS

## 2016-01-05 NOTE — Care Management Note (Signed)
Case Management Note  Patient Details  Name: Bonnie Salazar MRN: [id number] Date of Birth: [birth year]  Subjective/Objective:    Pt s/p hip surgery - had PEA arrest - currently intubated     Action/Plan:  Pt if from home with husband.  CM will continue to monitor for disposition needs   Expected Discharge Date:                  Expected Discharge Plan:  [place]  In-House Referral:     Discharge planning Services  CM Consult  Post Acute Care Choice:    Choice offered to:     DME Arranged:    DME Agency:     HH Arranged:    HH Agency:     Status of Service:  In process, will continue to follow  Medicare Important Message Given:  Yes Date Medicare IM Given:    Medicare IM give by:    Date Additional Medicare IM Given:    Additional Medicare Important Message give by:     If discussed at [place] of Stay Meetings, dates discussed:    Additional Comments: CIR recommended - CSW consulted for back up plan Maryclare Labrador, RN 01/05/2016, 10:40 AM

## 2016-01-05 NOTE — Progress Notes (Signed)
Nutrition Follow-up / Consult  DOCUMENTATION CODES:   Not applicable  INTERVENTION:    Continue TF via NGT, change formula to Vital AF 1.2 at goal rate of 55 ml/h (1320 ml per day) to provide 1584 kcals, 99 gm protein, 1071 ml free water daily.  NUTRITION DIAGNOSIS:   Inadequate oral intake related to inability to eat, dysphagia as evidenced by NPO status.  Ongoing  GOAL:   Patient will meet greater than or equal to 90% of their needs  Progressing  MONITOR:   Labs, Weight trends, TF tolerance, I & O's, Diet advancement  REASON FOR ASSESSMENT:   Consult Enteral/tube feeding initiation and management  ASSESSMENT:   75 y.o. female who complains of Left hip pain s/p fall at home. No LOC. She tripped on a drawer. Pain was rated as a 10/10 at the time of injury. No CP, SOB. XRays show left femoral neck fracture. Patient was extubated 6/23. Discussed patient in ICU rounds and with RN today. SLP following for ability to begin PO diet, but patient is not yet ready. Remains NPO. NGT in place. Patient is currently receiving Vital High Protein via NGT at 40 ml/h (960 ml/day) with Prostat 30 ml BID to provide 1160 kcals, 114 gm protein, 803 ml free water daily.  Received MD Consult for TF initiation and management.  Diet Order:   NPO  Skin:  Reviewed, no issues  Last BM:  6/14  Height:   Ht Readings from Last 1 Encounters:  12/27/15 5\' 7"  (1.702 m)    Weight:   Wt Readings from Last 1 Encounters:  01/05/16 129 lb 13.6 oz (58.9 kg)    Ideal Body Weight:  61.36 kg  BMI:  Body mass index is 20.33 kg/(m^2).  Estimated Nutritional Needs:   Kcal:  1500-1700  Protein:  85-100 gm  Fluid:  1.5-1.7 L  EDUCATION NEEDS:   No education needs identified at this time  Molli Barrows, [place], [place], [place] Pager [phone] After Hours Pager [phone]

## 2016-01-05 NOTE — Progress Notes (Signed)
K+ level 3.0 result reported to Marni Griffon NP. Orders received

## 2016-01-05 NOTE — Progress Notes (Addendum)
Rehab Admissions Coordinator Note:  Patient was screened by Cleatrice Burke for appropriateness for an Inpatient Acute Rehab Consult per PT recommendation.   At this time, we are recommending await further progress with therapy before determining rehab venue options..  Pt sat EOB only with therapy yesterday. Recommend OT evaluation order. [employer] Medicare approval needed for any rehab venue.  Cleatrice Burke 01/05/2016, 11:20 AM  I can be reached at [phone].

## 2016-01-05 NOTE — Progress Notes (Signed)
Report called and patient transferred to 2C02 with husband at bedside

## 2016-01-05 NOTE — Progress Notes (Signed)
PULMONARY / CRITICAL CARE MEDICINE   Name: Bonnie Salazar MRN: [id number] DOB: 09-Oct-1940    ADMISSION DATE:  12/25/2015 CONSULTATION DATE:  12/26/15  REFERRING MD:  Trauma service  CHIEF COMPLAINT:  Hip fracture; need for mechanical ventilation post-operatively  HISTORY OF PRESENT ILLNESS:  Bonnie Salazar is a 89F who presented 12/25/15 after an fall at home with resultant left femoral neck fracture. She was taken to the OR for operative repair 6/16. The initial repair appeared uneventful and she was extubated in the PACU. Shortly thereafter, she complained of abdominal pain and was noted to have a tense abdomen. CT scan showed extravasation into the LLQ; she was taken to IR and underwent covered stent of the distal external iliac artery to the inguinal ligament. Vascular surgery was also consulted. No signs of distal ischemia on his exam. There is some concern for abdominal compartment syndrome, as well. MTP was initiated and she received 11u pRBC, 7FFP, 2 PLT pheresis, 2 cryoprecipitate.  Returned to ICU on mechanical ventilation.  She suffered a PEA arrest 6/18.  ICU course complicated by further bleeding.  IVC filter placed.   SUBJECTIVE:  Bm noted  VITAL SIGNS: BP 134/81 mmHg  Pulse 102  Temp(Src) 97.4 F (36.3 C) (Oral)  Resp 30  Ht 5\' 7"  (1.702 m)  Wt 58.9 kg (129 lb 13.6 oz)  BMI 20.33 kg/m2  SpO2 95%  HEMODYNAMICS:    VENTILATOR SETTINGS:    INTAKE / OUTPUT:  Intake/Output Summary (Last 24 hours) at 01/05/16 0708 Last data filed at 01/05/16 0600  Gross per 24 hour  Intake 1103.66 ml  Output   2075 ml  Net -971.34 ml   PHYSICAL EXAMINATION:  General Ill appearing female in NAD  HEENT [place]/AT, PERRLA, EOM-I and MMM.  Pulmonary Reduced CTA  Cardiovascular Regular; II/VI diastolic heart murmur.  Distal pulses palpable.  Abdomen Distended, hypoactive bowel sounds no r/g  Musculoskeletal L hip post-surgical changes / dressing in place, mild ecchymosis  Lymphatics No  cervical, supraclavicular or axillary adenopathy.   Neurologic Grossly intact.nonfocal, intact  Skin/Integuement No rash, no cyanosis, no clubbing   LABS:  BMET  Recent Labs Lab 01/04/16 0400 01/04/16 1405 01/05/16 0335  NA 142 144 146*  K 2.2* 3.0* 2.0*  CL 95* 104 105  CO2 30 29 30   BUN 63* 70* 84*  CREATININE 1.34* 1.21* 1.07*  GLUCOSE 126* 173* 168*   Electrolytes  Recent Labs Lab 01/04/16 0400 01/04/16 1030 01/04/16 1405 01/04/16 1506 01/05/16 0335  CALCIUM 10.5*  --  10.2  --  10.2  MG  --  1.3*  --  2.9* 2.9*  PHOS  --  1.5*  --  3.1 3.0   CBC  Recent Labs Lab 01/03/16 0449 01/04/16 0400 01/05/16 0335  WBC 15.3* 20.5* 18.7*  HGB 9.4* 11.0* 11.0*  HCT 29.1* 34.9* 35.3*  PLT 147* 234 246   Coag's  Recent Labs Lab 12/31/15 0951  APTT 33  INR 1.30     Sepsis Markers  Recent Labs Lab 01/01/16 1100 01/02/16 0420 01/03/16 0449 01/04/16 1030  LATICACIDVEN  --   --   --  1.3  PROCALCITON 0.59 0.53 0.53  --    ABG  Recent Labs Lab 01/01/16 1153 01/02/16 0327 01/03/16 0345  PHART 7.402 7.533* 7.567*  PCO2ART 50.5* 45.5* 40.5  PO2ART 86.6 93.3 77.8*   Liver Enzymes  Recent Labs Lab 12/31/15 0400 01/01/16 0434  AST 60*  --   ALT 24  --  ALKPHOS 77  --   BILITOT 1.1  --   ALBUMIN 2.1* 3.4*   Cardiac Enzymes  Recent Labs Lab 01/02/16 1803 01/03/16 0449  TROPONINI 0.26* 0.23*   Glucose  Recent Labs Lab 01/04/16 0743 01/04/16 1230 01/04/16 1628 01/04/16 1937 01/04/16 2359 01/05/16 0331  GLUCAP 150* 144* 169* 136* 135* 169*   Imaging Dg Abd Portable 1v  01/04/2016  CLINICAL DATA:  OG tube placement. EXAM: PORTABLE ABDOMEN - 1 VIEW COMPARISON:  None. FINDINGS: OG tube appears adequately positioned in the stomach, with proximal side holes located approximately 4 cm below the level of the gastroesophageal junction. Visualized bowel gas pattern is nonobstructive. No evidence of free intraperitoneal air seen. IVC filter in  place with tip at the L3 vertebral body level. IMPRESSION: OG tube adequately positioned in the stomach. Electronically Signed   By: Franki Cabot M.D.   On: 01/04/2016 12:09     STUDIES:  ECHO 6/17 >> Left ventricle: The cavity size was normal. Systolic function was normal. The estimated ejection fraction was in the range of 50% to 55%. Wall motion was normal; there were no regional wall motion abnormalities.- Mitral valve: Moderate prolapse. There was severe regurgitation. - Left atrium: The atrium was mildly dilated.- Right ventricle: The cavity size was mildly dilated. Wall thickness was normal.- Right atrium: There is a large mobile mass in the right atrium  that prolapses toward the mitral valve. CT chest 6/17 >> Bilateral pulmonary emboli, nearly occlusive in the left lower lobe segmental branches, involving all of the segmental pulmonary arteries. Evidence of right heart strain with right/left heart ratio of over 1. Heterogeneous area of hypoattenuation within the right atrium may represent right atrial clot versus mixing of non-opacified blood from the inferior vena cava. Bilateral pleural effusions and atelectatic changes of bilateral lung bases. LE [place] 6/17 >> negative for DVT ECHO 6/18 >> LVEF 55-60%, normal wall motion, moderate MVP, mod to severe MR, RA mildly dilated, RA with large moblie pedunculated and irregular mass that prolapses through the TV c/w thrombus CT ABD/Pelvis 6/18 >> no evidence of new intraperitoneal or pelvic hemorrhage, increasing hematoma in left proximal thigh, decreasing volume of pulmonary embolus in the bilateral lower lobes CT Head 6/18 >> No CT evidence of acute intracranial abnormality ECHO 6/19 >> LVEF 50-55%, normal wall motion, no RWMA, previous RA mass no longer demonstrated on this ECHO, mild increase in PA systolic pressure  CULTURES: BCx2 6/20 >>  Sputum 6/21 >> neg  ANTIBIOTICS: Perioperative Ancef 6/20 Zosyn >>> 6/21 Vancomycin >>  6/23  SIGNIFICANT EVENTS: 6/17  Admit after fall with L hip fx,  L HIP replacement, extubated, developed abd pain found to have intraabdominal hematoma.  6/17  AF w/ CE elevation so ECHO checked-->round RA thrombus. Started on heparin. Cards, CVTS and IR involved in care. Later that evening increased resp distress. FIO2 req increased. CT chest w/ bilateral PE 6/18  remaining hypoxic. Hgb stable, repeat ECHO obtained and consideration for EKOS.  PEA Arrest 6/20  On neo 20-80, sedation, heparin.  No weaning.  Anasarca 6/21 drop in HgB, 2 units of PRBC, ordered IVC filter, Heparin gtt stopped  LINES/TUBES: Foley 6/16  PIV ETT 6/16-->6/17 >> 6/18 >> 6/23 L [place] DLC 6/16 >>  DISCUSSION: Ms. Estime is a 21F who remains intubated after a complicated L hip replacement with arterial avulsion of the left external iliac artery requiring covered stent placement by IR c/b intraabdominal / pelvic hematoma, hgb has been stable. Course complicated  by increased CEs, AF, ECHO w/ RA thrombus, later c/b PE, and hypoxic resp failure. She was placed on heparin and multiple specialties have been involved.  6/18 am RV fxn worse and there is still large clot in the RA. 6/18  underwent catheter directed lysis per IR.  She later developed a drop in blood pressure while on TPA and it was discontinued.  Case was discussed with VVS and she was deemed not a surgical candidate.  The patient was stabilized and went for repeat CT of abd/pelvis which showed increased left thigh hematoma, no increase in pelvic hemorrhage.   Follow up ECHO on 6/19 did not show atrial mass. TPA stopped and heparin gtt initiated. 6/21 Drop in HgB, Heparin gtt stopped, IVC filter placed.    ASSESSMENT / PLAN:  PULMONARY A: Acute Hypoxic Respiratory Failure  Mild Pulmonary edema  Acute bilateral PE-->RV strain has increased but not sure that this is resp for her resp failure   P:   O2 to support sats > 92% Filter in place  CARDIOVASCULAR A:   Troponin elevation -->likely demand ischemia  Heart murmur Intermittent AF Severe MR RA clott;  acute PE (likely migrated) PEA Arrest - 6/18, approx 10 minutes Shock - resolved 6/21 P:  Keep tele IVC filter  RENAL A:   Hypokalemia hypernatremia P:   continued to hold lasix with k 2 Aggressive k supp, then repeat level Consider 1/2 NS  GASTROINTESTINAL A:   Concern for abdominal compartment syndrome--> no evidence of active bleeding  At Risk Protein Calorie Malnutrition P:   TF SLP for swallowing evaluation on going Famotidine for ppx.  HEMATOLOGIC A:   Arterial hemorrhage s/p stenting and requiring massive transfusion propofol RA thrombus-->likely propagated from right pelvic vein compression from hematoma-->migrated and now with BILATERAL PE P:  Trend CBC am  INFECTIOUS A:   No acute issues - leukocytosis likely stress response Fever - new 6/21, abx initiated S/p periop Ancef 2/3 of all pt who get RP bleed have leukocytosis P:   Zosyn dc F/U cultures remain neg Pct also neg  ENDOCRINE A:   No acute issues  P:   Trend glucose on chemistries  SSI   NEUROLOGIC A:   Anxiety and post-op pain  P:   RASS goal: 0 Minimize sedating medications Space boots for foot drop prevention PT  FAMILY  - Updates:   Family updated 6/25 at bedside  Await sdu bed  Lavon Paganini. Titus Mould, MD, [place] Pgr: [facility]

## 2016-01-05 NOTE — Progress Notes (Signed)
Physical Therapy Treatment Patient Details Name: Bonnie Salazar MRN: [id number] DOB: Mar 31, 1941 Today's Date: 01/05/2016    History of Present Illness 75 y.o. female admitted to [facility] on 12/25/15 s/p fall with resultant L hip fx s/p L direct anterior THA, WBAT. Pt was extubated in the PACU. Shortly thereafter, she complained of abdominal pain and was noted to have a tense abdomen. CT scan showed extravasation into the LLQ; she was taken to IR and underwent covered stent of the distal external iliac artery to the inguinal ligament. Vascular surgery was also consulted. No signs of distal ischemia on his exam. There is some concern for abdominal compartment syndrome, as well. MTP was initiated and she received 11u pRBC, 7FFP, 2 PLT pheresis, 2 cryoprecipitate. Returned to ICU on mechanical ventilation. She suffered a PEA arrest 6/18. ICU course complicated by further bleeding. IVC filter placed. Intubated 6/16-6/17/17 and 6/18-6/23/17. Pt with significant PMHx of back surgery listed in chart. bil PE found on CT 6/17.     PT Comments    Patient progressing slowly towards PT goals. Pt tolerated sitting EOB with assist for trunk support. Tolerated standing trials for pericare with Max A of 2 for balance and upright. Performed SPT to chair with assist of 2. Encouraged exercises while sitting in chair. Will follow.   Follow Up Recommendations  CIR     Equipment Recommendations  Rolling walker with 5" wheels;3in1 (PT)    Recommendations for Other Services       Precautions / Restrictions Precautions Precautions: Fall Restrictions Weight Bearing Restrictions: Yes LLE Weight Bearing: Weight bearing as tolerated    Mobility  Bed Mobility Overal bed mobility: Needs Assistance Bed Mobility: Supine to Sit     Supine to sit: Max assist;HOB elevated     General bed mobility comments: Able to initiate movement of BLEs but requires assist to get them to EOB, assist with bottom and trunk.    Transfers Overall transfer level: Needs assistance Equipment used: 2 person hand held assist Transfers: Sit to/from [facility] Sit to Stand: Max assist;+2 physical assistance Stand pivot transfers: Max assist;+2 physical assistance       General transfer comment: Assist of 2 to stand from EOB x2 with cues for technique; manual cues for hip extension and upright posture. SPT bed to chair with Max A of 2.  Ambulation/Gait                 Stairs            Wheelchair Mobility    Modified Rankin (Stroke Patients Only)       Balance Overall balance assessment: Needs assistance Sitting-balance support: Feet supported;Bilateral upper extremity supported Sitting balance-Leahy Scale: Poor Sitting balance - Comments: Mod A sitting EOB to support trunk, posterior lean. Able to sit EOB for short periods with Min guard assist. Postural control: Posterior lean Standing balance support: During functional activity Standing balance-Leahy Scale: Zero Standing balance comment: Requires BUE support in standing with flexed trunk. manual cues needed for upright posture.                    Cognition Arousal/Alertness: Awake/alert Behavior During Therapy: Flat affect Overall Cognitive Status: Impaired/Different from baseline Area of Impairment: Problem solving;Memory;Following commands     Memory: Decreased short-term memory Following Commands: Follows one step commands inconsistently;Follows one step commands with increased time     Problem Solving: Slow processing;Decreased initiation;Difficulty sequencing;Requires verbal cues;Requires tactile cues      Exercises  General Exercises - Lower Extremity Ankle Circles/Pumps: Both;15 reps;Supine Long Arc Quad: Both;5 reps;Seated    General Comments        Pertinent Vitals/Pain Pain Assessment: Faces Faces Pain Scale: Hurts little more Pain Location: left hip during mobility Pain Descriptors /  Indicators: Grimacing Pain Intervention(s): Monitored during session;Repositioned;Limited activity within patient's tolerance    Home Living                      Prior Function            PT Goals (current goals can now be found in the care plan section) Progress towards PT goals: Progressing toward goals    Frequency  Min 5X/week    PT Plan Current plan remains appropriate    Co-evaluation             End of Session Equipment Utilized During Treatment: Oxygen;Gait belt Activity Tolerance: Patient tolerated treatment well Patient left: in chair;with call bell/phone within reach;with nursing/sitter in room     Time: [id number] PT Time Calculation (min) (ACUTE ONLY): 24 min  Charges:  $Therapeutic Activity: 23-37 mins                    G Codes:      Mairany Bruno A Laquanna Veazey 01/05/2016, 12:20 PM Wray Kearns, [place], DPT [phone]

## 2016-01-05 NOTE — Progress Notes (Signed)
Speech Language Pathology Treatment: Dysphagia  Patient Details Name: Bonnie Salazar MRN: [id number] DOB: 12-18-1940 Today's Date: 01/05/2016 Time: [id number] SLP Time Calculation (min) (ACUTE ONLY): 13 min  Assessment / Plan / Recommendation Clinical Impression  Dysphagia treatment provided for PO trials. Bonnie Salazar continues to present with low vocal intensity and weak cough; subjectively seems slightly improved compared to previous date. Bonnie Salazar had an immediate cough response following 1 out of 3 trials of ice chips and 1 out of 2 trials of puree, along with multiple swallows suggestive of pharyngeal residue. Bonnie Salazar continues to be at high risk of aspiration but hopeful for improvement as Bonnie Salazar continues to recover from prolonged intubation. Recommend that Bonnie Salazar continue to be NPO with meds via alternative means. Educated Bonnie Salazar and husband on recommendations; they are in agreement. Will continue to follow for PO readiness.   HPI HPI: Bonnie Salazar is a 75 y.o. female without significant PMH, admitted 6/15 after sustaining a fall with L femoral neck fracture. Bonnie Salazar was intubated 6/16 for surgery, extubated 6/17. On 6/18 Bonnie Salazar with sudden drop in BP/ back pain while on TPA, then PEA arrest and required resuscitation and was re-intubated, extubated 6/23. Noted to have strong cough and able to speak name. Most recent CXR showed persistent atelectasis versus consolidation LLL. Bedside swallow eval ordered due to prolonged/ multiple intubations.       SLP Plan  Continue with current plan of care     Recommendations  Diet recommendations: NPO Medication Administration: Via alternative means             Oral Care Recommendations: Oral care QID Follow up Recommendations: Inpatient Rehab Plan: Continue with current plan of care     [place], Amy K, [place], CCC-SLP 01/05/2016, 9:27 AM [phone]

## 2016-01-05 NOTE — Progress Notes (Signed)
Patient with loose bloody stool. Dr Titus Mould informed repeat CBC sent per orders

## 2016-01-05 NOTE — Progress Notes (Signed)
eLink Physician-Brief Progress Note Patient Name: Bonnie Salazar DOB: 04-27-1941 MRN: [id number]   Date of Service  01/05/2016  HPI/Events of Note  Hypokalemia 2.0. Creatinine 1.07. Has CVL & NGT.  eICU Interventions  1. KCl 25mEq VT x1 2. KCl 21mEq IV x6 runs     Intervention Category Major Interventions: Electrolyte abnormality - evaluation and management  Tera Partridge 01/05/2016, 4:27 AM

## 2016-01-05 NOTE — Care Management Important Message (Signed)
Important Message  Patient Details  Name: Bonnie Salazar MRN: [id number] Date of Birth: 25-Oct-1940   Medicare Important Message Given:  Yes    Dewanna Hurston Abena 01/05/2016, 10:30 AM

## 2016-01-05 NOTE — Progress Notes (Signed)
Assessment/Plan: POD # 10 TOTAL HIP ARTHROPLASTY ANTERIOR APPROACH (Left)   Principal Problem:   Fracture of femoral neck, left (HCC) Active Problems:   Hip fracture (HCC)   Leukocytosis   Dehydration   Atrial thrombus (HCC)   Acute pulmonary embolism (HCC)   Acute respiratory failure (HCC)   Hemorrhagic shock   Cardiac arrest (HCC)   Left leg is stable w/ continued improvement in edema, compartments soft.  Extremities warm, +pulses.   H/H stable.  Nodding and responds to Yes/No questions.  Wiggles toes.  Squeezes hands.  Distal sensation intact.    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   Subsequent Right atrial thrombus. -Catheter thrombolysis w/ tPA for right atrial thrombus was started and stopped w/ onset of new pain and required CPR/resuscitation on 6/18. -Echo from 12/29/15 - Previous right atrial mass is no longer demonstrated. -Heparin held for IVC filter placement. -H/H stable, electrolytes requiring correction dt diuresis. -NPO for aspiration risk, dysphagia.  Prolonged intubation.   -EOB with therapy  Greatly appreciate consultants and their management.  Subjective: Nodding and responds quietly / appropriately to yes/no questions.      Objective: Vital signs in last 24 hours: Temp:  [97.4 F (36.3 C)-98.9 F (37.2 C)] 98.9 F (37.2 C) (06/26 1139) Pulse Rate:  [53-156] 107 (06/26 1100) Resp:  [21-32] 31 (06/26 1100) BP: (106-148)/(54-88) 148/84 mmHg (06/26 1100) SpO2:  [94 %-98 %] 95 % (06/26 1100) Weight:  [58.9 kg (129 lb 13.6 oz)] 58.9 kg (129 lb 13.6 oz) (06/26 0200)  Intake/Output from previous day: 06/25 0701 - 06/26 0700 In: 1103.7 [I.V.:2.3; NG/GT:801.3; IV Piggyback:300] Out: 2075 [Urine:2075] Intake/Output this shift: Total I/O In: 340 [I.V.:30; NG/GT:160; IV Piggyback:150] Out: 425 [Urine:425]   Recent Labs  01/03/16 0449 01/04/16 0400 01/05/16 0335 01/05/16 0823  HGB 9.4* 11.0* 11.0* 11.0*    Recent Labs  01/05/16 0335 01/05/16 0823  WBC 18.7* 20.1*  RBC 3.71* 3.77*  HCT 35.3* 36.1  PLT 246 257    Recent Labs  01/04/16 1405 01/05/16 0335  NA 144 146*  K 3.0* 2.0*  CL 104 105  CO2 29 30  BUN 70* 84*  CREATININE 1.21* 1.07*  GLUCOSE 173* 168*  CALCIUM 10.2 10.2   Physical Exam General:  Alert.  Quiet yes/no answers & Nods/shakes head to questions.  Musculoskeletal: B/L extremities warm, + pulses.   Dressing: c/d/i  Prudencio Burly III 01/05/2016, 12:56 PM

## 2016-01-06 DIAGNOSIS — R571 Hypovolemic shock: Secondary | ICD-10-CM | POA: Diagnosis present

## 2016-01-06 DIAGNOSIS — E876 Hypokalemia: Secondary | ICD-10-CM | POA: Diagnosis present

## 2016-01-06 DIAGNOSIS — K625 Hemorrhage of anus and rectum: Secondary | ICD-10-CM

## 2016-01-06 DIAGNOSIS — I513 Intracardiac thrombosis, not elsewhere classified: Secondary | ICD-10-CM | POA: Diagnosis present

## 2016-01-06 DIAGNOSIS — R58 Hemorrhage, not elsewhere classified: Secondary | ICD-10-CM | POA: Diagnosis present

## 2016-01-06 DIAGNOSIS — I2699 Other pulmonary embolism without acute cor pulmonale: Secondary | ICD-10-CM

## 2016-01-06 DIAGNOSIS — K921 Melena: Secondary | ICD-10-CM | POA: Diagnosis present

## 2016-01-06 DIAGNOSIS — E870 Hyperosmolality and hypernatremia: Secondary | ICD-10-CM | POA: Diagnosis present

## 2016-01-06 DIAGNOSIS — I469 Cardiac arrest, cause unspecified: Secondary | ICD-10-CM | POA: Diagnosis present

## 2016-01-06 DIAGNOSIS — J9601 Acute respiratory failure with hypoxia: Secondary | ICD-10-CM | POA: Diagnosis present

## 2016-01-06 LAB — CBC WITH DIFFERENTIAL/PLATELET
BASOS PCT: 0 %
Basophils Absolute: 0 10*3/uL (ref 0.0–0.1)
EOS ABS: 0 10*3/uL (ref 0.0–0.7)
EOS PCT: 0 %
HCT: 36 % (ref 36.0–46.0)
Hemoglobin: 11.3 g/dL — ABNORMAL LOW (ref 12.0–15.0)
LYMPHS ABS: 1.2 10*3/uL (ref 0.7–4.0)
Lymphocytes Relative: 6 %
MCH: 31.2 pg (ref 26.0–34.0)
MCHC: 31.4 g/dL (ref 30.0–36.0)
MCV: 99.4 fL (ref 78.0–100.0)
MONO ABS: 1.2 10*3/uL — AB (ref 0.1–1.0)
Monocytes Relative: 6 %
NEUTROS ABS: 18 10*3/uL — AB (ref 1.7–7.7)
NEUTROS PCT: 88 %
PLATELETS: 260 10*3/uL (ref 150–400)
RBC: 3.62 MIL/uL — ABNORMAL LOW (ref 3.87–5.11)
RDW: 19.7 % — ABNORMAL HIGH (ref 11.5–15.5)
WBC: 20.4 10*3/uL — ABNORMAL HIGH (ref 4.0–10.5)

## 2016-01-06 LAB — URINE MICROSCOPIC-ADD ON: WBC, UA: NONE SEEN WBC/hpf (ref 0–5)

## 2016-01-06 LAB — URINALYSIS, ROUTINE W REFLEX MICROSCOPIC
BILIRUBIN URINE: NEGATIVE
Glucose, UA: NEGATIVE mg/dL
KETONES UR: NEGATIVE mg/dL
LEUKOCYTES UA: NEGATIVE
Nitrite: NEGATIVE
PH: 6 (ref 5.0–8.0)
Protein, ur: 100 mg/dL — AB
Specific Gravity, Urine: 1.026 (ref 1.005–1.030)

## 2016-01-06 LAB — HEMOGLOBIN AND HEMATOCRIT, BLOOD
HCT: 35.2 % — ABNORMAL LOW (ref 36.0–46.0)
Hemoglobin: 10.5 g/dL — ABNORMAL LOW (ref 12.0–15.0)

## 2016-01-06 LAB — COMPREHENSIVE METABOLIC PANEL
ALK PHOS: 78 U/L (ref 38–126)
ALT: 33 U/L (ref 14–54)
AST: 48 U/L — AB (ref 15–41)
Albumin: 4.2 g/dL (ref 3.5–5.0)
Anion gap: 9 (ref 5–15)
BUN: 82 mg/dL — AB (ref 6–20)
CALCIUM: 10 mg/dL (ref 8.9–10.3)
CHLORIDE: 119 mmol/L — AB (ref 101–111)
CO2: 27 mmol/L (ref 22–32)
CREATININE: 1 mg/dL (ref 0.44–1.00)
GFR calc non Af Amer: 54 mL/min — ABNORMAL LOW (ref >60)
GLUCOSE: 194 mg/dL — AB (ref 65–99)
Potassium: 3.2 mmol/L — ABNORMAL LOW (ref 3.5–5.1)
SODIUM: 155 mmol/L — AB (ref 135–145)
Total Bilirubin: 2 mg/dL — ABNORMAL HIGH (ref 0.3–1.2)
Total Protein: 7.3 g/dL (ref 6.5–8.1)

## 2016-01-06 LAB — PROTIME-INR
INR: 1.29 (ref 0.00–1.49)
Prothrombin Time: 16.2 seconds — ABNORMAL HIGH (ref 11.6–15.2)

## 2016-01-06 LAB — GLUCOSE, CAPILLARY
GLUCOSE-CAPILLARY: 171 mg/dL — AB (ref 65–99)
GLUCOSE-CAPILLARY: 215 mg/dL — AB (ref 65–99)
GLUCOSE-CAPILLARY: 147 mg/dL — AB (ref 65–99)
Glucose-Capillary: 135 mg/dL — ABNORMAL HIGH (ref 65–99)
Glucose-Capillary: 142 mg/dL — ABNORMAL HIGH (ref 65–99)

## 2016-01-06 MED ORDER — METOPROLOL TARTRATE 5 MG/5ML IV SOLN
5.0000 mg | Freq: Four times a day (QID) | INTRAVENOUS | Status: DC
  Administered 2016-01-06 – 2016-01-07 (×4): 5 mg via INTRAVENOUS
  Filled 2016-01-06 (×4): qty 5

## 2016-01-06 MED ORDER — POTASSIUM CHLORIDE 10 MEQ/100ML IV SOLN
10.0000 meq | INTRAVENOUS | Status: AC
  Administered 2016-01-06 (×4): 10 meq via INTRAVENOUS
  Filled 2016-01-06 (×3): qty 100

## 2016-01-06 MED ORDER — FREE WATER
200.0000 mL | Freq: Three times a day (TID) | Status: DC
  Administered 2016-01-06 – 2016-01-08 (×4): 200 mL

## 2016-01-06 MED ORDER — PANTOPRAZOLE SODIUM 40 MG PO PACK
40.0000 mg | PACK | Freq: Two times a day (BID) | ORAL | Status: DC
Start: 2016-01-06 — End: 2016-01-07
  Administered 2016-01-06 (×2): 40 mg
  Filled 2016-01-06 (×2): qty 20

## 2016-01-06 MED ORDER — DEXTROSE 5 % IV SOLN
INTRAVENOUS | Status: DC
  Administered 2016-01-06: 10:00:00 via INTRAVENOUS

## 2016-01-06 NOTE — Progress Notes (Signed)
Speech Language Pathology Treatment: Dysphagia  Patient Details Name: Bonnie Salazar MRN: [id number] DOB: [birth year] Today's Date: 01/06/2016 Time: [id number] SLP Time Calculation (min) (ACUTE ONLY): 12 min  Assessment / Plan / Recommendation Clinical Impression  Pt demonstrates persisting dysphagia - NG tube present.  RR 27; SP02 at 100% on 3 liters [place]; phonation weak but audible.  Weak cough.  Consumption of ice chips continues to elicit s/s of dysphagia with risk of aspiration (likely delayed swallow, multiple subswallows per bolus, weak, wet cough).  Mod cues provided for head positioning and effortful swallow.  Pt with delayed response time and processing.  Recommend f/u next date to determine readiness for instrumental swallow study.  D/W pt and spouse.  Will follow.    HPI HPI: Pt is a 75 y.o. female without significant PMH, admitted 6/15 after sustaining a fall with L femoral neck fracture. Pt was intubated 6/16 for surgery, extubated 6/17. On 6/18 pt with sudden drop in BP/ back pain while on TPA, then PEA arrest and required resuscitation and was re-intubated, extubated 6/23. Noted to have strong cough and able to speak name. Most recent CXR showed persistent atelectasis versus consolidation LLL. Bedside swallow eval ordered due to prolonged/ multiple intubations.       SLP Plan  Continue with current plan of care     Recommendations  Diet recommendations: NPO Medication Administration: Via alternative means             Oral Care Recommendations: Oral care QID Follow up Recommendations: Inpatient Rehab Plan: Continue with current plan of care     GO               Bonnie Salazar, [place] CCC/SLP Pager [phone]  Juan Quam Laurice 01/06/2016, 12:50 PM

## 2016-01-06 NOTE — Progress Notes (Signed)
Assessment/Plan: POD # 11 TOTAL HIP ARTHROPLASTY ANTERIOR APPROACH (Left)   Principal Problem:   Fracture of femoral neck, left (HCC) Active Problems:   Hip fracture (HCC)   Leukocytosis   Dehydration   Atrial thrombus (HCC)   Acute pulmonary embolism (HCC)   Acute respiratory failure (HCC)   Hemorrhagic shock   Cardiac arrest (HCC)   Left leg is stable w/ decreased edema, thigh and compartments soft.  Extremities warm, +pulses.   H/H stable.  Responds quietly to questions.  Wiggles toes.  Squeezes hands.  Distal sensation intact.    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   Subsequent Right atrial thrombus. -Catheter thrombolysis w/ tPA for right atrial thrombus was started and stopped w/ onset of new pain and required CPR/resuscitation on 6/18. -Echo from 12/29/15 - Previous right atrial mass is no longer demonstrated. -Heparin held for IVC filter placement. -H/H stable - Blood in BMs. -NPO for aspiration risk, dysphagia.  Prolonged intubation.   -EOB with therapy going well.  Greatly appreciate consultants and their management.  Subjective: Responds quietly / appropriately to yes/no questions.  Pain controlled.  Generally feeling better.  Husband in room.  Objective: Vital signs in last 24 hours: Temp:  [97.4 F (36.3 C)-101.3 F (38.5 C)] 97.5 F (36.4 C) (06/27 1100) Pulse Rate:  [86-118] 115 (06/27 1100) Resp:  [23-34] 31 (06/27 1100) BP: (114-155)/(68-92) 140/87 mmHg (06/27 1100) SpO2:  [95 %-100 %] 98 % (06/27 1100) Weight:  [59.2 kg (130 lb 8.2 oz)-61.4 kg (135 lb 5.8 oz)] 59.2 kg (130 lb 8.2 oz) (06/27 0501)  Intake/Output from previous day: 06/26 0701 - 06/27 0700 In: 1955 [I.V.:450; NG/GT:1355; IV Piggyback:150] Out: 575 [Urine:575] Intake/Output this shift: Total I/O In: 685 [I.V.:110; NG/GT:275; IV Piggyback:300] Out: 675 [Urine:675]   Recent Labs  01/04/16 0400 01/05/16 0335 01/05/16 0823 01/06/16 0506  HGB 11.0* 11.0*  11.0* 11.3*    Recent Labs  01/05/16 0823 01/06/16 0506  WBC 20.1* 20.4*  RBC 3.77* 3.62*  HCT 36.1 36.0  PLT 257 260    Recent Labs  01/05/16 1500 01/06/16 0506  NA 151* 155*  K 3.0* 3.2*  CL 112* 119*  CO2 28 27  BUN 90* 82*  CREATININE 1.09* 1.00  GLUCOSE 123* 194*  CALCIUM 10.4* 10.0   Physical Exam General:  Alert.  Upright in chair.  Quiet yes/no answers & Nods/shakes head to questions.  Musculoskeletal: B/L extremities warm, + pulses.   Dressing: c/d/i  Prudencio Burly III 01/06/2016, 2:13 PM

## 2016-01-06 NOTE — Progress Notes (Signed)
PROGRESS NOTE    Bonnie Salazar  ONG:[id number] DOB: Apr 15, 1941 DOA: 12/25/2015 PCP: [name]   Brief Narrative:  75 y.o. WF without significant past medical history  She sustained an accidental fall at home. She tripped on a open drawer, causing her fall. She subsequently developed left hip pain, 10/10 in intensity with any movement. No LOC. No chest pain or shortness of breath. No swelling. No palpitations. She was taken to the OR for operative repair 6/16. The initial repair appeared uneventful and she was extubated in the PACU. Shortly thereafter, she complained of abdominal pain and was noted to have a tense abdomen. CT scan showed extravasation into the LLQ; she was taken to IR and underwent covered stent of the distal external iliac artery to the inguinal ligament. Vascular surgery was also consulted. No signs of distal ischemia on his exam. There is some concern for abdominal compartment syndrome, as well. MTP was initiated and she received 11u pRBC, 7 FFP, 2 PLT pheresis, 2 cryoprecipitate. Returned to ICU on mechanical ventilation. She suffered a PEA arrest 6/18. ICU course complicated by further bleeding. IVC filter placed.    Assessment & Plan:   Principal Problem:   Fracture of femoral neck, left (HCC) Active Problems:   Hip fracture (HCC)   Leukocytosis   Dehydration   Atrial thrombus (HCC)   Acute pulmonary embolism (HCC)   Acute respiratory failure (HCC)   Hemorrhagic shock   Cardiac arrest (HCC)   Acute pulmonary embolus (HCC)   Atrial mass   Bleeding   Pulmonary embolus, left (HCC)   Pulmonary embolus, right (HCC)   Acute respiratory failure with hypoxia (HCC)   PEA (Pulseless electrical activity) (HCC)   Right atrial thrombus (HCC)   Arterial hemorrhage   Hypovolemic shock (HCC)   Hypokalemia   Hypernatremia   Gastrointestinal hemorrhage with melena   Left femoral neck fracture after accidental fall -Resolved. S/P repair by  orthopedic  Acute Hypoxic Respiratory Failure  -Secondary to bilateral PE : Resolved -Titrate O2 to maintain SPO2> 92%  Positive Acute bilateral PE-->RV strain  -See acute respiratory failure  PEA Arrest on 6/18(~10 minutes)/Troponin elevation -Occurred Post thrombolysis falls are a thrombus   -Most likely secondary demand ischemia   Paroxysmal atrial fibrillation/severe MR -All anticoagulants on hold secondary to patient's multiple sites of bleeding. -Review chart appears heparin drip stopped on 6/21 -HR not well controlled -Continue Cardizem drip -Increase metoprolol IV to 5 mg QID  RA thrombus - likely propagated from right pelvic vein compression from hematoma-->migrated and now with BILATERAL PE -S/P, Thrombolysis   Heart murmur  Arterial hemorrhage s/p stenting and requiring massive transfusion  -See significant events -6/16 transfuse to 14 units PRBC, 8 units FFP, 2 units cryoprecipitate, 2 units platelets -6/18 transfused 2 units PRBC, 8 units FFP -6/21 transfused 2 units PRBC  Hypovolemic Shock - resolved 6/21 -IVC filter in place  Hypokalemia -Potassium goal> -Potassium IV 40 mEq   Hypernatremia -Free water 263m TID   Recent Labs Lab 01/04/16 0400 01/04/16 1405 01/05/16 0335 01/05/16 1500 01/06/16 0506  NA 142 144 146* 151* 155*   GI bleed  -Review chart; appears patient last received heparin on 6/21 -6/27 Overnight patient with significant melena/diarrhea -H/H QID  Recent Labs Lab 01/03/16 0449 01/04/16 0400 01/05/16 0335 01/05/16 0823 01/06/16 0506  HGB 9.4* 11.0* 11.0* 11.0* 11.3*  -Protonix 40 mg per tube BID -Ischemic colitis? (Acute bleed with leukocytosis). Consulted GI -Will await GI recommendations  Protein Calorie Malnutrition -  TF  Anxiety and post-op pain      DVT prophylaxis: SCD Code Status: Full Family Communication: Spoke with husband at length Disposition Plan: SNF   Consultants:  Dr. Carole Binning Orthopedics surgery Dr.Matthew Hamburg surgery D.O. Corrie Mckusick  IR [facility]    Procedures/Significant Events:  6/16 TOTAL HIP ARTHROPLASTY;Lt ext iliac artery bleed/pelvic hematoma 6/16 CT pelvis with contrast;Active bleeding in the pelvis. Originating fm Proximal left common femoral artery/distal left external iliac artery. Bleeding near origin of the left inferior epigastric artery.-Large amount of blood and hematoma formation throughout the abdomen and pelvis. 6/16 left lower extremity angiogram; emergent covered stent embolization of distal external iliac artery branch vessel pseudoaneurysm/avulsion, with placement of stent graft 39m x 5cm viabahn  ECHO 6/17 >> LVEF= 50-55%; - Mitral valve: Moderate prolapse w/ severe regurgitation. - Left atrium: mildly dilated.- Right ventricle: mildly dilated. 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 CT chest 6/17 >> Bilateral pulmonary emboli, nearly occlusive in the left lower lobe segmental branches, involving all of the segmental pulmonary arteries. Evidence of right heart strain with right/left heart ratio> 1. -Heterogeneous area of hypoattenuation within the right atrium may represent right atrial clot versus mixing of non-opacified blood from the inferior vena cava. Bilateral pleural effusions and atelectatic changes of bilateral lung bases. LE [place]6/17 >> negative for DVT ECHO 6/18 >> LVEF 55-60%, normal wall motion, moderate MVP, mod to severe MR, RA mildly dilated, RA with large moblie pedunculated and irregular mass that prolapses through the TV c/w thrombus CT ABD/Pelvis 6/18 >> no evidence of new intraperitoneal or pelvic hemorrhage, increasing hematoma in left proximal thigh, decreasing volume of pulmonary embolus in the bilateral lower lobes 6/18 TPA for right atrial thrombus 6/18; PEA arrest CT Head 6/18 >> No CT evidence of acute intracranial  abnormality ECHO 6/19 >> LVEF 50-55%, normal wall motion, no RWMA, previous RA mass no longer demonstrated on this ECHO, mild increase in PA systolic pressure 60/96infrarenal IVC filter placement      Cultures 6/20 blood left hand 2 negative   6/21 tracheal aspirate negative   Antimicrobials: Perioperative Ancef 6/20 Zosyn >>> 6/26 6/21 Vancomycin >> 6/23   Devices    LINES / TUBES:  Foley 6/16  PIV ETT 6/16-->6/17 >> 6/18 >> 6/23 L  DLC 6/16 >>    Continuous Infusions: . diltiazem (CARDIZEM) infusion Stopped (01/04/16 0220)  . feeding supplement (VITAL AF 1.2 CAL) 1,000 mL (01/06/16 00454     Subjective: 6/27 alert, follows commands, will not answer questions (depression?). Per husband no previous history of GI bleed.     Objective: Filed Vitals:   01/06/16 0412 01/06/16 0501 01/06/16 0734 01/06/16 1100  BP: 130/74  138/77 140/87  Pulse: 117 111 112 115  Temp: 100.7 F (38.2 C)  97.7 F (36.5 C) 97.5 F (36.4 C)  TempSrc: Axillary  Oral Oral  Resp: '29 30 28 31  '$ Height:      Weight:  59.2 kg (130 lb 8.2 oz)    SpO2: 98% 97% 98% 98%    Intake/Output Summary (Last 24 hours) at 01/06/16 1512 Last data filed at 01/06/16 1453  Gross per 24 hour  Intake   2015 ml  Output   1060 ml  Net    955 ml   Filed Weights   01/05/16 0200 01/05/16 1800 01/06/16 0501  Weight: 58.9 kg (129 lb 13.6 oz) 61.4 kg (  135 lb 5.8 oz) 59.2 kg (130 lb 8.2 oz)    Examination:  General: Alert, will not answer questions, follows commands, No acute respiratory distress Eyes: negative scleral hemorrhage, negative anisocoria, negative icterus ENT: Negative Runny nose, negative gingival bleeding, Neck:  Negative scars, masses, torticollis, lymphadenopathy, JVD, left subclavian CVL covering clean. Lungs: Clear to auscultation bilaterally without wheezes or crackles Cardiovascular: Regular rate and rhythm without murmur gallop or rub normal S1 and S2 Abdomen: negative  abdominal pain, nondistended, positive soft, bowel sounds, no rebound, no ascites, no appreciable mass Extremities: No significant cyanosis, clubbing, or edema bilateral lower extremities Skin: Negative rashes, lesions, ulcers GI; small external hemorrhoid 7:00 position mild bleeding. BRBPR squirting onto sheets. Central nervous system:  Cranial nerves II through XII intact, tongue/uvula midline, all extremities muscle strength 5/5, sensation intact throughout, unable to fully evaluate .     Data Reviewed: Care during the described time interval was provided by me .  I have reviewed this patient's available data, including medical history, events of note, physical examination, and all test results as part of my evaluation. I have personally reviewed and interpreted all radiology studies.  CBC:  Recent Labs Lab 01/03/16 0449 01/04/16 0400 01/05/16 0335 01/05/16 0823 01/06/16 0506  WBC 15.3* 20.5* 18.7* 20.1* 20.4*  NEUTROABS  --   --   --   --  18.0*  HGB 9.4* 11.0* 11.0* 11.0* 11.3*  HCT 29.1* 34.9* 35.3* 36.1 36.0  MCV 90.9 93.3 95.1 95.8 99.4  PLT 147* 234 246 257 497   Basic Metabolic Panel:  Recent Labs Lab 01/03/16 0449  01/04/16 0400 01/04/16 1030 01/04/16 1405 01/04/16 1506 01/05/16 0335 01/05/16 1500 01/06/16 0506  NA 137  < > 142  --  144  --  146* 151* 155*  K 2.5*  < > 2.2*  --  3.0*  --  2.0* 3.0* 3.2*  CL 83*  < > 95*  --  104  --  105 112* 119*  CO2 35*  < > 30  --  29  --  '30 28 27  '$ GLUCOSE 149*  < > 126*  --  173*  --  168* 123* 194*  BUN 43*  < > 63*  --  70*  --  84* 90* 82*  CREATININE 1.18*  < > 1.34*  --  1.21*  --  1.07* 1.09* 1.00  CALCIUM 10.8*  < > 10.5*  --  10.2  --  10.2 10.4* 10.0  MG 2.3  --   --  1.3*  --  2.9* 2.9* 3.0*  --   PHOS 2.8  --   --  1.5*  --  3.1 3.0 2.1*  --   < > = values in this interval not displayed. GFR: Estimated Creatinine Clearance: 46.1 mL/min (by C-G formula based on Cr of 1). Liver Function Tests:  Recent  Labs Lab 12/31/15 0400 01/01/16 0434 01/06/16 0506  AST 60*  --  48*  ALT 24  --  33  ALKPHOS 77  --  78  BILITOT 1.1  --  2.0*  PROT 4.3*  --  7.3  ALBUMIN 2.1* 3.4* 4.2   No results for input(s): LIPASE, AMYLASE in the last 168 hours. No results for input(s): AMMONIA in the last 168 hours. Coagulation Profile:  Recent Labs Lab 12/31/15 0951  INR 1.30   Cardiac Enzymes:  Recent Labs Lab 01/02/16 1803 01/03/16 0449  TROPONINI 0.26* 0.23*   BNP (last 3 results) No results  for input(s): PROBNP in the last 8760 hours. HbA1C: No results for input(s): HGBA1C in the last 72 hours. CBG:  Recent Labs Lab 01/05/16 2012 01/05/16 2309 01/06/16 0410 01/06/16 0733 01/06/16 1147  GLUCAP 180* 137* 171* 135* 215*   Lipid Profile: No results for input(s): CHOL, HDL, LDLCALC, TRIG, CHOLHDL, LDLDIRECT in the last 72 hours. Thyroid Function Tests: No results for input(s): TSH, T4TOTAL, FREET4, T3FREE, THYROIDAB in the last 72 hours. Anemia Panel: No results for input(s): VITAMINB12, FOLATE, FERRITIN, TIBC, IRON, RETICCTPCT in the last 72 hours. Urine analysis:    Component Value Date/Time   COLORURINE YELLOW 01/06/2016 0610   APPEARANCEUR CLOUDY* 01/06/2016 0610   LABSPEC 1.026 01/06/2016 0610   PHURINE 6.0 01/06/2016 0610   GLUCOSEU NEGATIVE 01/06/2016 0610   HGBUR LARGE* 01/06/2016 0610   BILIRUBINUR NEGATIVE 01/06/2016 0610   KETONESUR NEGATIVE 01/06/2016 0610   PROTEINUR 100* 01/06/2016 0610   NITRITE NEGATIVE 01/06/2016 0610   LEUKOCYTESUR NEGATIVE 01/06/2016 0610   Sepsis Labs: '@LABRCNTIP'$ (procalcitonin:4,lacticidven:4)  ) Recent Results (from the past 240 hour(s))  Culture, blood (routine x 2)     Status: None   Collection Time: 12/30/15  6:24 PM  Result Value Ref Range Status   Specimen Description BLOOD LEFT HAND  Final   Special Requests IN PEDIATRIC BOTTLE 2CC  Final   Culture NO GROWTH 5 DAYS  Final   Report Status 01/04/2016 FINAL  Final  Culture,  blood (routine x 2)     Status: None   Collection Time: 12/30/15  6:36 PM  Result Value Ref Range Status   Specimen Description BLOOD LEFT HAND  Final   Special Requests IN PEDIATRIC BOTTLE [place]  Final   Culture NO GROWTH 5 DAYS  Final   Report Status 01/04/2016 FINAL  Final  Culture, respiratory (NON-Expectorated)     Status: None   Collection Time: 12/31/15 12:10 PM  Result Value Ref Range Status   Specimen Description TRACHEAL ASPIRATE  Final   Special Requests Normal  Final   Gram Stain   Final    ABUNDANT WBC PRESENT,BOTH PMN AND MONONUCLEAR NO ORGANISMS SEEN    Culture NO GROWTH 2 DAYS  Final   Report Status 01/02/2016 FINAL  Final         Radiology Studies: Dg Chest Port 1 View  01/05/2016  CLINICAL DATA:  Acute respiratory failure EXAM: PORTABLE CHEST 1 VIEW COMPARISON:  01/04/2016 FINDINGS: NG tube has been placed with the tip in the proximal stomach. Left Central line is unchanged. Heart is normal size. Left lower lobe atelectasis, improved since prior study. No confluent opacity on the right. No visible effusions. IMPRESSION: Improving left basilar atelectasis. Electronically Signed   By: Rolm Baptise M.D.   On: 01/05/2016 07:09        Scheduled Meds: . antiseptic oral rinse  7 mL Mouth Rinse QID  . chlorhexidine gluconate (SAGE KIT)  15 mL Mouth Rinse BID  . famotidine (PEPCID) IV  20 mg Intravenous Q24H  . free water  200 mL Per Tube Q8H  . insulin aspart  0-15 Units Subcutaneous Q4H  . metoprolol  5 mg Intravenous Q6H  . pantoprazole sodium  40 mg Per Tube BID   Continuous Infusions: . diltiazem (CARDIZEM) infusion Stopped (01/04/16 0220)  . feeding supplement (VITAL AF 1.2 CAL) 1,000 mL (01/06/16 0655)     LOS: 12 days    Time spent: 40 minutes    Riya Huxford, Geraldo Docker, MD [facility] Pager [phone]  If 7PM-7AM, please contact night-coverage [email or web address] Password [email or web address] 01/06/2016, 3:12 PM

## 2016-01-06 NOTE — Progress Notes (Signed)
eLink Physician-Brief Progress Note Patient Name: Bonnie Salazar DOB: 1941-02-15 MRN: [id number]   Date of Service  01/06/2016  HPI/Events of Note  Bleeding from stool or urine. Nurse not sure as patient is incontinent. BP = 130/74. CBC pending.   eICU Interventions  Will order: 1. Foley catheter.  2. UA once Foley catheter placed.  3. Stool for occult blood.     Intervention Category Intermediate Interventions: Other:  Valjean Ruppel Cornelia Copa 01/06/2016, 5:22 AM

## 2016-01-06 NOTE — Consult Note (Signed)
[facility] Consult: 1:39 PM 01/06/2016  LOS: 12 days    Referring Provider: Dr Sherral Hammers  Primary Care Physician:  [name] Primary Gastroenterologist:  unassigned    Reason for Consultation:  GI bleeding.    HPI: Bonnie Salazar is a 75 y.o. female.  Pt with previously unremarkable medical hx, previous back surgery.  On 6/15 she tripped, fell and sustained left fem neck fracture.   6/16 left TAH, anterior approach.  Given 3 units PRBCs during surgery for intra-op bleeding.  Post op day 0 developed external iliac bleed and hematoma, s/p IR placed stent to distal external iliac artery down to the inguinal ligament.  Required multiple blood products: FFP, PRBCs, platelets, cryoprecipitate.  6/17 developed rapid a fib and CT confirmed bil PE. SOB and right heart strain led to Echo 6/17 showed atrial mass, severe MR due to MV prolapse. Dr Cyndia Bent, [place] surgeon, felt this is most likely clot probably from the pelvic veins that may have been compressed by the pelvic hematoma following her vascular injury. This is much less likely to be an atrial tumor like a myxoma with diffuse bilateral emboli.  Heparin initiated.  6/18 IR, Dr Earleen Newport, performed right atrial thrombolysis using tPA.  tPA infusion stopped after about 4 hours due to acute resp distress requiring ETT placement, vent support.   6/18 suffered PEA arrest for ~ 10 minutes.   CTA performed "negative for new pelvic/abdominal hemorrhage. Questionable venous bleeding or small artery of thigh musculature, with associated hematoma. This is likely not amenable to a directed embolization, given high likelihood of venous source.  CT shows smaller PE burden of subsegmental lower lobes". 6/21 IVC filter placed.  Off heparin since 6/21.       Has required  intubation of ~ 3 occasions.  15 PRBCs, 4 cryoprecipitate, 7 FFP, 2 platelets.   Starting ~ 5 AM today, staff noted reddish color to bedding and then blood well mixed into stool.  Has had ~ 3 episodes so far.   Hgb of 11.3 at 0500 today, stable over las 4 days.  A repeat Hgb and set of coags just drawn (240 PM). Coags of 16.3 and 1.3 on 6/21.   Empiric IV BID Protonix started this AM, replacing IV Pepcid. .       Past Medical History  Diagnosis Date  . Medical history non-contributory   . Acute pulmonary embolism ([facility]) 12/28/2015    Past Surgical History  Procedure Laterality Date  . Back surgery    . Total hip arthroplasty Left 12/26/2015    Procedure: TOTAL HIP ARTHROPLASTY ANTERIOR APPROACH;  Surgeon: Renette Butters, MD;  Location: [facility];  Service: Orthopedics;  Laterality: Left;    Prior to Admission medications   Medication Sig Start Date End Date Taking? Authorizing Provider  CALCIUM PO Take 2 tablets by mouth daily.   Yes Historical Provider, MD  chlorhexidine (PERIDEX) 0.12 % solution 15 mLs by Mouth Rinse route 2 (two) times daily. 12/22/15  Yes Historical Provider, MD  Cholecalciferol (VITAMIN D PO) Take 1 capsule  by mouth daily.   Yes Historical Provider, MD  fluticasone (FLONASE) 50 MCG/ACT nasal spray Place 2 sprays into the nose daily as needed for allergies.  11/01/15  Yes Historical Provider, MD  GINKGO BILOBA PO Take 1 capsule by mouth daily.   Yes Historical Provider, MD  Misc Natural Products (GLUCOSAMINE CHONDROITIN TRIPLE) TABS Take 2 tablets by mouth daily.   Yes Historical Provider, MD  Multiple Vitamin (MULTIVITAMIN WITH MINERALS) TABS tablet Take 1 tablet by mouth daily.   Yes Historical Provider, MD  Multiple Vitamins-Minerals (ICAPS AREDS 2) CAPS Take 1 capsule by mouth 2 (two) times daily.   Yes Historical Provider, MD  naproxen sodium (ANAPROX) 220 MG tablet Take 220 mg by mouth at bedtime as needed (pain).   Yes Historical Provider, MD  Omega-3 Fatty Acids  (OMEGA 3 PO) Take 1 capsule by mouth daily.   Yes Historical Provider, MD  aspirin EC 325 MG tablet Take 1 tablet (325 mg total) by mouth daily. 12/26/15   Geradine Girt, DO  omeprazole (PRILOSEC) 20 MG capsule Take 1 capsule (20 mg total) by mouth daily. 12/26/15   Geradine Girt, DO  ondansetron (ZOFRAN) 4 MG tablet Take 1 tablet (4 mg total) by mouth every 8 (eight) hours as needed for nausea or vomiting. 12/26/15   Geradine Girt, DO  oxyCODONE-acetaminophen (ROXICET) 5-325 MG tablet Take 1-2 tablets by mouth every 4 (four) hours as needed for severe pain. 12/26/15   Geradine Girt, DO    Scheduled Meds: . antiseptic oral rinse  7 mL Mouth Rinse QID  . chlorhexidine gluconate (SAGE KIT)  15 mL Mouth Rinse BID  . famotidine (PEPCID) IV  20 mg Intravenous Q24H  . free water  200 mL Per Tube Q8H  . insulin aspart  0-15 Units Subcutaneous Q4H  . pantoprazole sodium  40 mg Per Tube BID   Infusions: . diltiazem (CARDIZEM) infusion Stopped (01/04/16 0220)  . feeding supplement (VITAL AF 1.2 CAL) 1,000 mL (01/06/16 0655)   PRN Meds: acetaminophen (TYLENOL) oral liquid 160 mg/5 mL, docusate, hydrALAZINE, [DISCONTINUED] metoCLOPramide **OR** metoCLOPramide (REGLAN) injection, metoprolol, metoprolol, ondansetron **OR** ondansetron (ZOFRAN) IV   Allergies as of 12/25/2015  . (No Known Allergies)    Family History  Problem Relation Age of Onset  . Hodgkin's lymphoma Mother   . Stroke Father   . Coronary artery disease Father     in his late 21s    Social History   Social History  . Marital Status: Married    Spouse Name: N/A  . Number of Children: N/A  . Years of Education: N/A   Occupational History  . Retired Estate manager/land agent    Social History Main Topics  . Smoking status: Former Research scientist (life sciences)  . Smokeless tobacco: Former Systems developer    Quit date: 12/11/1995  . Alcohol Use: No  . Drug Use: No  . Sexual Activity: Not on file   Other Topics Concern  . Not on file   Social  History Narrative   Pt lives with her husband, walks daily.    REVIEW OF SYSTEMS: Constitutional:  weakness ENT:  No nose bleeds Pulm:  + cough, on [place] oxygen CV:  No palpitations, no LE edema.  GU:  No hematuria, no frequency GI:  No previous issues per husband Heme:  Per HPI   Transfusions:  Per HPI Neuro:  No headaches, no peripheral tingling or numbness Derm:  No itching, no rash or sores.  Endocrine:  No  sweats or chills.  No polyuria or dysuria Immunization:  Not queried Travel:  None beyond local counties in last few months.    PHYSICAL EXAM: Vital signs in last 24 hours: Filed Vitals:   01/06/16 0734 01/06/16 1100  BP: 138/77 140/87  Pulse: 112 115  Temp: 97.7 F (36.5 C) 97.5 F (36.4 C)  Resp: 28 31   Wt Readings from Last 3 Encounters:  01/06/16 59.2 kg (130 lb 8.2 oz)    General: frail looking thin elderly WF Head:  No asymmetry or swelling  Eyes:  No icterus or pallor Ears:  No obvious hearing deficits  Nose:  No discharge Mouth:  Clear and moist oral MM.  Good dentition Neck:  No  Mass, no JVD Lungs:  Clear bil.   Heart: tachy with PVCs on monitor, in 1teens Abdomen:  Protuberant, moderately tender all over.  No guard or rebound.  Hypoactive BS   Rectal: no digital exam but saw the stool on pad after clean up: it is pasty, has bright red blood well mixed into the unformed stool.     Musc/Skeltl: no joint swelling or redness Extremities:  No CCE  Neurologic:  Alert, nods appropriately to yes no questions.  Voice is weak.  ,  Skin:  No rash, sores or telangectasia.    Intake/Output from previous day: 06/26 0701 - 06/27 0700 In: 1955 [I.V.:450; NG/GT:1355; IV Piggyback:150] Out: 575 [Urine:575] Intake/Output this shift: Total I/O In: 685 [I.V.:110; NG/GT:275; IV Piggyback:300] Out: 675 [Urine:675]  LAB RESULTS:  Recent Labs  01/05/16 0335 01/05/16 0823 01/06/16 0506  WBC 18.7* 20.1* 20.4*  HGB 11.0* 11.0* 11.3*  HCT 35.3* 36.1 36.0  PLT  246 257 260   BMET Lab Results  Component Value Date   NA 155* 01/06/2016   NA 151* 01/05/2016   NA 146* 01/05/2016   K 3.2* 01/06/2016   K 3.0* 01/05/2016   K 2.0* 01/05/2016   CL 119* 01/06/2016   CL 112* 01/05/2016   CL 105 01/05/2016   CO2 27 01/06/2016   CO2 28 01/05/2016   CO2 30 01/05/2016   GLUCOSE 194* 01/06/2016   GLUCOSE 123* 01/05/2016   GLUCOSE 168* 01/05/2016   BUN 82* 01/06/2016   BUN 90* 01/05/2016   BUN 84* 01/05/2016   CREATININE 1.00 01/06/2016   CREATININE 1.09* 01/05/2016   CREATININE 1.07* 01/05/2016   CALCIUM 10.0 01/06/2016   CALCIUM 10.4* 01/05/2016   CALCIUM 10.2 01/05/2016   LFT  Recent Labs  01/06/16 0506  PROT 7.3  ALBUMIN 4.2  AST 48*  ALT 33  ALKPHOS 78  BILITOT 2.0*   PT/INR Lab Results  Component Value Date   INR 1.30 12/31/2015   INR 1.37 12/27/2015   INR 1.35 12/27/2015   Hepatitis Panel No results for input(s): HEPBSAG, HCVAB, HEPAIGM, HEPBIGM in the last 72 hours. C-Diff No components found for: CDIFF Lipase  No results found for: LIPASE  Drugs of Abuse  No results found for: LABOPIA, COCAINSCRNUR, LABBENZ, AMPHETMU, THCU, LABBARB   RADIOLOGY STUDIES: Dg Chest Port 1 View  01/05/2016  CLINICAL DATA:  Acute respiratory failure EXAM: PORTABLE CHEST 1 VIEW COMPARISON:  01/04/2016 FINDINGS: NG tube has been placed with the tip in the proximal stomach. Left Central line is unchanged. Heart is normal size. Left lower lobe atelectasis, improved since prior study. No confluent opacity on the right. No visible effusions. IMPRESSION: Improving left basilar atelectasis. Electronically Signed   By: Charlett Nose M.D.   On:  01/05/2016 07:09    ENDOSCOPIC STUDIES: None ever per husband's  IMPRESSION:   *  Bloody BM.  Rule out ischemic colitis, less likely (by appearance of stool) this is diverticular bleed.  No previous GIB or endoscopic studies.  *  Left hip fracture 0/07, complicated peri/post op course following left THR.       PLAN:     *  ? Flex sig or EGD.  Dr Silverio Decamp to follow.    Azucena Freed  01/06/2016, 1:39 PM Pager: [phone]

## 2016-01-06 NOTE — Evaluation (Signed)
Occupational Therapy Evaluation Patient Details Name: Bonnie Salazar MRN: [id number] DOB: 1941/04/29 Today's Date: 01/06/2016    History of Present Illness 75 y.o. female admitted to [facility] on 12/25/15 s/p fall with resultant L hip fx s/p L direct anterior THA, WBAT. Pt was extubated in the PACU. Shortly thereafter, she complained of abdominal pain and was noted to have a tense abdomen. CT scan showed extravasation into the LLQ; she was taken to IR and underwent covered stent of the distal external iliac artery to the inguinal ligament. Vascular surgery was also consulted. No signs of distal ischemia on his exam. There is some concern for abdominal compartment syndrome, as well. MTP was initiated and she received 11u pRBC, 7FFP, 2 PLT pheresis, 2 cryoprecipitate. Returned to ICU on mechanical ventilation. She suffered a PEA arrest 6/18. ICU course complicated by further bleeding. IVC filter placed. Intubated 6/16-6/17/17 and 6/18-6/23/17. Pt with significant PMHx of back surgery listed in chart. bil PE found on CT 6/17.    Clinical Impression   Pt admitted with above. She demonstrates the below listed deficits and will benefit from continued OT to maximize safety and independence with BADLs.  Pt presents to OT with generalized weakness, impaired balance, pain, impaired cognition.  She currently requires total A for ADLs and max A +2 for transfers.  She is very motivated.  Anticipate she will make slow, steady progress toward goals.  Recommend CIR      Follow Up Recommendations  CIR;Supervision/Assistance - 24 hour    Equipment Recommendations  3 in 1 bedside comode;Tub/shower bench    Recommendations for Other Services Rehab consult     Precautions / Restrictions Precautions Precautions: Fall Restrictions Weight Bearing Restrictions: Yes LLE Weight Bearing: Weight bearing as tolerated      Mobility Bed Mobility Overal bed mobility: Needs Assistance Bed Mobility: Supine to  Sit     Supine to sit: Mod assist;+2 for physical assistance     General bed mobility comments: Able to initiate movement of Bil LEs but requires assist to get them to EOB, assist to elevate trunk and to scoot to EOB.   Transfers Overall transfer level: Needs assistance Equipment used: Rolling walker (2 wheeled) Transfers: Sit to/from [facility] Sit to Stand: Mod assist;+2 physical assistance Stand pivot transfers: Max assist;+2 physical assistance       General transfer comment: Assist to boost and to steady w/ cues for hand placement and technique.  Pt demonstrates flexed posture.  Has difficulty weight shifting but is able to shuffle feet to turn w/ assist to steady and to manage RW.    Balance Overall balance assessment: Needs assistance Sitting-balance support: Feet supported;Single extremity supported Sitting balance-Leahy Scale: Fair Sitting balance - Comments: Min guard>min assist provided while sitting EOB.  Posterior lean w/ LAQ and cues for upright posture as pt fatigues. Postural control: Posterior lean Standing balance support: Bilateral upper extremity supported;During functional activity Standing balance-Leahy Scale: Zero Standing balance comment: Relies on UE support and physical assist                            ADL Overall ADL's : Needs assistance/impaired Eating/Feeding: NPO   Grooming: Wash/dry hands;Wash/dry face;Oral care;Total assistance;Sitting Grooming Details (indicate cue type and reason): requires hand over hand assist  Upper Body Bathing: Total assistance;Bed level;Sitting   Lower Body Bathing: Total assistance;Sit to/from stand   Upper Body Dressing : Total assistance;Sitting   Lower Body Dressing: Total  assistance;Bed level;Sit to/from stand   Toilet Transfer: Maximal assistance;+2 for physical assistance;Stand-pivot;BSC;RW   Toileting- Clothing Manipulation and Hygiene: Total assistance;Sit to/from stand        Functional mobility during ADLs: Maximal assistance;+2 for physical assistance;Rolling walker General ADL Comments: Pt requires assist due to generalized weakness      Vision     Perception     Praxis Praxis Praxis tested?: Deficits Deficits: Initiation    Pertinent Vitals/Pain Pain Assessment: Faces Faces Pain Scale: Hurts little more Pain Location: Lt hip Pain Descriptors / Indicators: Grimacing Pain Intervention(s): Limited activity within patient's tolerance;Monitored during session;Repositioned     Hand Dominance Right   Extremity/Trunk Assessment Upper Extremity Assessment Upper Extremity Assessment: RUE deficits/detail;LUE deficits/detail RUE Deficits / Details: grossly 2-/5 RUE Coordination: decreased fine motor;decreased gross motor LUE Deficits / Details: grossly 2-/5 LUE Coordination: decreased fine motor;decreased gross motor   Lower Extremity Assessment Lower Extremity Assessment: Defer to PT evaluation   Cervical / Trunk Assessment Cervical / Trunk Assessment: Other exceptions Cervical / Trunk Exceptions: Decreased trunk control due to weakness    Communication Communication Communication: Other (comment) (Pt with soft voice.  Does not initiate conversation )   Cognition Arousal/Alertness: Awake/alert Behavior During Therapy: Flat affect Overall Cognitive Status: Impaired/Different from baseline Area of Impairment: Attention;Following commands;Problem solving   Current Attention Level: Sustained   Following Commands: Follows one step commands inconsistently;Follows one step commands with increased time     Problem Solving: Slow processing;Decreased initiation General Comments: Pt is very slow to initiate activity.  Appears to require significant effort to perform minimal motor movement/follow commands    General Comments       Exercises Exercises: Total Joint     Shoulder Instructions      Home Living Family/patient expects to be  discharged to:: Inpatient rehab                                        Prior Functioning/Environment Level of Independence: Independent             OT Diagnosis: Generalized weakness;Cognitive deficits;Acute pain   OT Problem List: Decreased strength;Decreased activity tolerance;Impaired balance (sitting and/or standing);Decreased cognition;Decreased safety awareness;Decreased knowledge of use of DME or AE;Decreased knowledge of precautions;Cardiopulmonary status limiting activity;Pain;Impaired UE functional use   OT Treatment/Interventions: Self-care/ADL training;Therapeutic exercise;Energy conservation;DME and/or AE instruction;Therapeutic activities;Cognitive remediation/compensation;Patient/family education;Balance training    OT Goals(Current goals can be found in the care plan section) Acute Rehab OT Goals Patient Stated Goal: none stated OT Goal Formulation: With patient Time For Goal Achievement: 01/20/16 Potential to Achieve Goals: Good ADL Goals Pt Will Perform Eating: with min assist;sitting Pt Will Perform Grooming: with min assist;sitting Pt Will Perform Upper Body Bathing: with mod assist;sitting Pt Will Perform Lower Body Bathing: with max assist;sit to/from stand Pt Will Perform Upper Body Dressing: with mod assist;sitting Pt Will Perform Lower Body Dressing: with max assist;with adaptive equipment;sit to/from stand Pt Will Transfer to Toilet: with mod assist;bedside commode;stand pivot transfer Pt/caregiver will Perform Home Exercise Program: Increased ROM;Right Upper extremity;Left upper extremity;With minimal assist;With written HEP provided  OT Frequency: Min 2X/week   Barriers to D/C:            Co-evaluation PT/OT/SLP Co-Evaluation/Treatment: Yes Reason for Co-Treatment: For patient/therapist safety PT goals addressed during session: Mobility/safety with mobility;Balance;Proper use of DME;Strengthening/ROM OT goals addressed during  session: ADL's and self-care;Strengthening/ROM  End of Session Equipment Utilized During Treatment: Rolling walker;Oxygen;Gait belt Nurse Communication: Mobility status  Activity Tolerance: Patient limited by fatigue Patient left: in chair;with call bell/phone within reach;with nursing/sitter in room   Time: [id number] OT Time Calculation (min): 23 min Charges:  OT General Charges $OT Visit: 1 Procedure OT Evaluation $OT Eval Moderate Complexity: 1 Procedure G-Codes:    Lucille Passy M 01-24-16, 10:37 AM

## 2016-01-06 NOTE — Progress Notes (Signed)
Physical Therapy Treatment Patient Details Name: Bonnie Salazar MRN: [id number] DOB: 18-Sep-1940 Today's Date: 01/06/2016    History of Present Illness 75 y.o. female admitted to [facility] on 12/25/15 s/p fall with resultant L hip fx s/p L direct anterior THA, WBAT. Pt was extubated in the PACU. Shortly thereafter, she complained of abdominal pain and was noted to have a tense abdomen. CT scan showed extravasation into the LLQ; she was taken to IR and underwent covered stent of the distal external iliac artery to the inguinal ligament. Vascular surgery was also consulted. No signs of distal ischemia on his exam. There is some concern for abdominal compartment syndrome, as well. MTP was initiated and she received 11u pRBC, 7FFP, 2 PLT pheresis, 2 cryoprecipitate. Returned to ICU on mechanical ventilation. She suffered a PEA arrest 6/18. ICU course complicated by further bleeding. IVC filter placed. Intubated 6/16-6/17/17 and 6/18-6/23/17. Pt with significant PMHx of back surgery listed in chart. bil PE found on CT 6/17.     PT Comments    Mrs. Vanloan made good progress today.  She requires min guard>min assist for trunk support while sitting EOB ~10 minutes and mod +2 assist to stand from bed today.  She fatigues quickly but participated well w/ good effort, completing therapeutic exercises at end of session.  Mrs. Yurkovich remains appropriate for CIR.   Follow Up Recommendations  CIR     Equipment Recommendations  Rolling walker with 5" wheels;3in1 (PT)    Recommendations for Other Services       Precautions / Restrictions Precautions Precautions: Fall Restrictions Weight Bearing Restrictions: Yes LLE Weight Bearing: Weight bearing as tolerated    Mobility  Bed Mobility Overal bed mobility: Needs Assistance Bed Mobility: Supine to Sit     Supine to sit: Mod assist;+2 for physical assistance     General bed mobility comments: Able to initiate movement of Bil LEs but requires  assist to get them to EOB, assist to elevate trunk and to scoot to EOB.   Transfers Overall transfer level: Needs assistance Equipment used: Rolling walker (2 wheeled) Transfers: Sit to/from [facility] Sit to Stand: Mod assist;+2 physical assistance Stand pivot transfers: Max assist;+2 physical assistance       General transfer comment: Assist to boost and to steady w/ cues for hand placement and technique.  Pt demonstrates flexed posture.  Has difficulty weight shifting but is able to shuffle feet to turn w/ assist to steady and to manage RW.  Ambulation/Gait             General Gait Details: deffered for pt/therapist safety and due to pt's fatigue w/ transfers   Stairs            Wheelchair Mobility    Modified Rankin (Stroke Patients Only)       Balance Overall balance assessment: Needs assistance Sitting-balance support: Feet supported;Single extremity supported Sitting balance-Leahy Scale: Fair Sitting balance - Comments: Min guard>min assist provided while sitting EOB.  Posterior lean w/ LAQ and cues for upright posture as pt fatigues. Postural control: Posterior lean Standing balance support: Bilateral upper extremity supported;During functional activity Standing balance-Leahy Scale: Zero Standing balance comment: Relies on UE support and physical assist                    Cognition Arousal/Alertness: Awake/alert Behavior During Therapy: Flat affect Overall Cognitive Status: Impaired/Different from baseline Area of Impairment: Attention;Following commands;Problem solving   Current Attention Level: Sustained   Following Commands:  Follows one step commands inconsistently;Follows one step commands with increased time     Problem Solving: Slow processing;Decreased initiation General Comments: Pt is very slow to initiate activity.  Appears to require significant effort to perform minimal motor movement/follow commands      Exercises Total Joint Exercises Ankle Circles/Pumps: AROM;PROM;20 reps;Seated;Both Heel Slides: 10 reps;PROM;Left;Seated Hip ABduction/ADduction: PROM;Left;10 reps;Seated Straight Leg Raises: PROM;Left;10 reps;Seated Long Arc Quad: AROM;Both;5 reps;Seated    General Comments General comments (skin integrity, edema, etc.): HR up to 123 w/ activity      Pertinent Vitals/Pain Pain Assessment: Faces Faces Pain Scale: Hurts little more Pain Location: Lt hip Pain Descriptors / Indicators: Grimacing Pain Intervention(s): Limited activity within patient's tolerance;Monitored during session;Repositioned    Home Living Family/patient expects to be discharged to:: Inpatient rehab                    Prior Function Level of Independence: Independent          PT Goals (current goals can now be found in the care plan section) Acute Rehab PT Goals Patient Stated Goal: none stated PT Goal Formulation: With patient/family Time For Goal Achievement: 01/18/16 Potential to Achieve Goals: Good Progress towards PT goals: Progressing toward goals    Frequency  Min 5X/week    PT Plan Current plan remains appropriate    Co-evaluation PT/OT/SLP Co-Evaluation/Treatment: Yes Reason for Co-Treatment: For patient/therapist safety PT goals addressed during session: Mobility/safety with mobility;Balance;Proper use of DME;Strengthening/ROM OT goals addressed during session: ADL's and self-care;Strengthening/ROM     End of Session Equipment Utilized During Treatment: Gait belt;Oxygen Activity Tolerance: Patient limited by fatigue Patient left: in chair;with call bell/phone within reach;with family/visitor present;with nursing/sitter in room     Time: 0929-1000 PT Time Calculation (min) (ACUTE ONLY): 31 min  Charges:  $Therapeutic Exercise: 8-22 mins                    G Codes:       Collie Siad PT, DPT  Pager: [phone] Phone: [phone] 01/06/2016, 10:35 AM

## 2016-01-07 ENCOUNTER — Inpatient Hospital Stay (HOSPITAL_COMMUNITY): Payer: Medicare HMO

## 2016-01-07 DIAGNOSIS — K625 Hemorrhage of anus and rectum: Secondary | ICD-10-CM | POA: Diagnosis present

## 2016-01-07 LAB — CBC
HCT: 33.7 % — ABNORMAL LOW (ref 36.0–46.0)
Hemoglobin: 10.2 g/dL — ABNORMAL LOW (ref 12.0–15.0)
MCH: 29.7 pg (ref 26.0–34.0)
MCHC: 30.3 g/dL (ref 30.0–36.0)
MCV: 98 fL (ref 78.0–100.0)
PLATELETS: 274 10*3/uL (ref 150–400)
RBC: 3.44 MIL/uL — ABNORMAL LOW (ref 3.87–5.11)
RDW: 19.9 % — ABNORMAL HIGH (ref 11.5–15.5)
WBC: 16.5 10*3/uL — ABNORMAL HIGH (ref 4.0–10.5)

## 2016-01-07 LAB — GLUCOSE, CAPILLARY
GLUCOSE-CAPILLARY: 111 mg/dL — AB (ref 65–99)
GLUCOSE-CAPILLARY: 127 mg/dL — AB (ref 65–99)
GLUCOSE-CAPILLARY: 149 mg/dL — AB (ref 65–99)
GLUCOSE-CAPILLARY: 159 mg/dL — AB (ref 65–99)
Glucose-Capillary: 113 mg/dL — ABNORMAL HIGH (ref 65–99)
Glucose-Capillary: 140 mg/dL — ABNORMAL HIGH (ref 65–99)

## 2016-01-07 LAB — BASIC METABOLIC PANEL
Anion gap: 11 (ref 5–15)
BUN: 63 mg/dL — AB (ref 6–20)
CALCIUM: 9.5 mg/dL (ref 8.9–10.3)
CO2: 26 mmol/L (ref 22–32)
CREATININE: 0.79 mg/dL (ref 0.44–1.00)
Chloride: 118 mmol/L — ABNORMAL HIGH (ref 101–111)
GFR calc Af Amer: >60 mL/min (ref >60)
GLUCOSE: 113 mg/dL — AB (ref 65–99)
Potassium: 3.1 mmol/L — ABNORMAL LOW (ref 3.5–5.1)
SODIUM: 155 mmol/L — AB (ref 135–145)

## 2016-01-07 LAB — MAGNESIUM: Magnesium: 2.6 mg/dL — ABNORMAL HIGH (ref 1.7–2.4)

## 2016-01-07 MED ORDER — METOPROLOL TARTRATE 5 MG/5ML IV SOLN
10.0000 mg | Freq: Four times a day (QID) | INTRAVENOUS | Status: DC
  Administered 2016-01-07 – 2016-01-11 (×14): 10 mg via INTRAVENOUS
  Filled 2016-01-07 (×15): qty 10

## 2016-01-07 MED ORDER — PANTOPRAZOLE SODIUM 40 MG PO PACK
40.0000 mg | PACK | Freq: Every day | ORAL | Status: DC
  Administered 2016-01-08 – 2016-01-13 (×6): 40 mg
  Filled 2016-01-07 (×6): qty 20

## 2016-01-07 NOTE — Progress Notes (Signed)
Pt's NG tube was found on the floor by RN. Attempt X3 to reinsert NG but was hitting resistance. Will page cortrack team in the morning.

## 2016-01-07 NOTE — Progress Notes (Signed)
Physical Therapy Treatment Patient Details Name: Bonnie Salazar MRN: [id number] DOB: 07/17/1940 Today's Date: 01/07/2016    History of Present Illness 75 y.o. female admitted to [facility] on 12/25/15 s/p fall with resultant L hip fx s/p L direct anterior THA, WBAT. Pt was extubated in the PACU. Shortly thereafter, she complained of abdominal pain and was noted to have a tense abdomen. CT scan showed extravasation into the LLQ; she was taken to IR and underwent covered stent of the distal external iliac artery to the inguinal ligament. Vascular surgery was also consulted. No signs of distal ischemia on his exam. There is some concern for abdominal compartment syndrome, as well. MTP was initiated and she received 11u pRBC, 7FFP, 2 PLT pheresis, 2 cryoprecipitate. Returned to ICU on mechanical ventilation. She suffered a PEA arrest 6/18. ICU course complicated by further bleeding. IVC filter placed. Intubated 6/16-6/17/17 and 6/18-6/23/17. Pt with significant PMHx of back surgery listed in chart. bil PE found on CT 6/17.     PT Comments    Ms. Flam made good progress today, ambulating 10 ft w/ RW and mod assist due to posterior lean.  Pt is quick to fatigue but is very motivated.  Pt will benefit from continued skilled PT services to increase functional independence and safety.   Follow Up Recommendations  CIR     Equipment Recommendations  Rolling walker with 5" wheels;3in1 (PT)    Recommendations for Other Services       Precautions / Restrictions Precautions Precautions: Fall Restrictions Weight Bearing Restrictions: Yes LLE Weight Bearing: Weight bearing as tolerated    Mobility  Bed Mobility Overal bed mobility: Needs Assistance Bed Mobility: Supine to Sit     Supine to sit: +2 for physical assistance;Min assist     General bed mobility comments: Able to initiate movement of Bil LEs but requires assist to get them to EOB, assist to elevate trunk and to scoot to EOB.    Transfers Overall transfer level: Needs assistance Equipment used: Rolling walker (2 wheeled) Transfers: Sit to/from Stand Sit to Stand: Mod assist;+2 physical assistance Stand pivot transfers: Mod assist;+2 safety/equipment       General transfer comment: Assist to boost and steady w/ cues for hand placement and technique.  Pt able to correct posterior bias and flexed posture this session w/ verbal and tactile cues.  Pt able to manage RW w/ pivot.  Ambulation/Gait Ambulation/Gait assistance: Mod assist;+2 safety/equipment Ambulation Distance (Feet): 10 Feet Assistive device: Rolling walker (2 wheeled) Gait Pattern/deviations: Shuffle;Decreased stride length;Trunk flexed;Narrow base of support   Gait velocity interpretation: <1.8 ft/sec, indicative of risk for recurrent falls General Gait Details: Pt shuffles feet and requires assist to steady due to posterior bias.  Moves slowly but pt motivated, taking much effort to ambulate 10 ft.  Close chair follow for safety as pt fatigues quickly.   Stairs            Wheelchair Mobility    Modified Rankin (Stroke Patients Only)       Balance Overall balance assessment: Needs assistance Sitting-balance support: Bilateral upper extremity supported;Feet supported Sitting balance-Leahy Scale: Fair Sitting balance - Comments: LOB posteriorly immediately after achieving sitting EOB.  Once pt stabilized and pt has Bil UEs supported on bed pt is able to maintain upright sitting EOB for ~5 minutes. Postural control: Posterior lean Standing balance support: Bilateral upper extremity supported;During functional activity Standing balance-Leahy Scale: Poor Standing balance comment: Relies on RW and physical assist  Cognition Arousal/Alertness: Awake/alert Behavior During Therapy: Flat affect Overall Cognitive Status: Impaired/Different from baseline Area of Impairment: Attention;Following commands;Problem  solving   Current Attention Level: Sustained   Following Commands: Follows one step commands inconsistently;Follows one step commands with increased time     Problem Solving: Slow processing;Decreased initiation General Comments: Appears to require significant effort to perform minimal motor movement/follow commands     Exercises Total Joint Exercises Ankle Circles/Pumps: AROM;Both;Supine;15 reps Straight Leg Raises: AAROM Long Arc Quad: Left;5 reps;Supine    General Comments General comments (skin integrity, edema, etc.): HR up to 119, SpO2 remains at or above 92% while on 3L O2.      Pertinent Vitals/Pain Pain Assessment: No/denies pain Faces Pain Scale: No hurt Pain Intervention(s): Limited activity within patient's tolerance;Monitored during session;Repositioned    Home Living                      Prior Function            PT Goals (current goals can now be found in the care plan section) Acute Rehab PT Goals Patient Stated Goal: none stated PT Goal Formulation: With patient/family Time For Goal Achievement: 01/18/16 Potential to Achieve Goals: Good Progress towards PT goals: Progressing toward goals    Frequency  Min 5X/week    PT Plan Current plan remains appropriate    Co-evaluation             End of Session Equipment Utilized During Treatment: Gait belt;Oxygen Activity Tolerance: Patient limited by fatigue Patient left: in chair;with call bell/phone within reach;with chair alarm set     Time: [id number] PT Time Calculation (min) (ACUTE ONLY): 24 min  Charges:  $Gait Training: 8-22 mins $Therapeutic Activity: 8-22 mins                    G Codes:       Collie Siad PT, DPT  Pager: [phone] Phone: [phone] 01/07/2016, 2:16 PM

## 2016-01-07 NOTE — Progress Notes (Signed)
Daily Rounding Note  01/07/2016, 8:29 AM  LOS: 13 days   SUBJECTIVE:       NGT found out. Staff unable to replace.  Plan is for SLP swallow reevaluation and decide if she needs to go for radiologically placed FT.  No BMs overnight or this AM.    OBJECTIVE:         Vital signs in last 24 hours:    Temp:  [97.5 F (36.4 C)-99.4 F (37.4 C)] 98.1 F (36.7 C) (06/28 0700) Pulse Rate:  [84-115] 94 (06/28 0800) Resp:  [23-33] 27 (06/28 0800) BP: (116-140)/(62-87) 125/65 mmHg (06/28 0800) SpO2:  [97 %-99 %] 97 % (06/28 0800) Weight:  [59.6 kg (131 lb 6.3 oz)] 59.6 kg (131 lb 6.3 oz) (06/28 0421) Last BM Date: 01/06/16 Filed Weights   01/05/16 1800 01/06/16 0501 01/07/16 0421  Weight: 61.4 kg (135 lb 5.8 oz) 59.2 kg (130 lb 8.2 oz) 59.6 kg (131 lb 6.3 oz)   General: expressive aphasia, alert.  Frail Heart: RRR Chest: clear bil.  No cough or SOB Abdomen: soft, no visible bruises.  Tender without guard/rebound throughout.  No masses.  BS present.   Extremities: no CCE.  Feet warm Neuro/Psych:  Alert, follows commands, expressive aphasia.    Intake/Output from previous day: 06/27 0701 - 06/28 0700 In: 2018.7 [I.V.:110; KM/MN:8177.1; IV Piggyback:350] Out: 2400 [Urine:2400]  Intake/Output this shift:    Lab Results:  Recent Labs  01/05/16 0823 01/06/16 0506 01/06/16 1430 01/07/16 0540  WBC 20.1* 20.4*  --  16.5*  HGB 11.0* 11.3* 10.5* 10.2*  HCT 36.1 36.0 35.2* 33.7*  PLT 257 260  --  274   BMET  Recent Labs  01/05/16 1500 01/06/16 0506 01/07/16 0540  NA 151* 155* 155*  K 3.0* 3.2* 3.1*  CL 112* 119* 118*  CO2 '28 27 26  '$ GLUCOSE 123* 194* 113*  BUN 90* 82* 63*  CREATININE 1.09* 1.00 0.79  CALCIUM 10.4* 10.0 9.5   LFT  Recent Labs  01/06/16 0506  PROT 7.3  ALBUMIN 4.2  AST 48*  ALT 33  ALKPHOS 78  BILITOT 2.0*   PT/INR  Recent Labs  01/06/16 1430  LABPROT 16.2*  INR 1.29   Hepatitis  Panel No results for input(s): HEPBSAG, HCVAB, HEPAIGM, HEPBIGM in the last 72 hours.  Studies/Results: No results found.   Scheduled Meds: . antiseptic oral rinse  7 mL Mouth Rinse QID  . chlorhexidine gluconate (SAGE KIT)  15 mL Mouth Rinse BID  . famotidine (PEPCID) IV  20 mg Intravenous Q24H  . free water  200 mL Per Tube Q8H  . insulin aspart  0-15 Units Subcutaneous Q4H  . metoprolol  5 mg Intravenous Q6H  . pantoprazole sodium  40 mg Per Tube BID   Continuous Infusions: . diltiazem (CARDIZEM) infusion Stopped (01/04/16 0220)  . feeding supplement (VITAL AF 1.2 CAL) 1,000 mL (01/06/16 2347)   PRN Meds:.acetaminophen (TYLENOL) oral liquid 160 mg/5 mL, docusate, hydrALAZINE, [DISCONTINUED] metoCLOPramide **OR** metoCLOPramide (REGLAN) injection, metoprolol, ondansetron **OR** ondansetron (ZOFRAN) IV   ASSESMENT:   *  Bloody stools.  Suspect ischemic colitis, hemorrhoids present per RN, but not actively oozing or bleeding.   *  Blood loss anemia for post op ext iliac artery bleed.  S/p multiple blood products earlier in admission.  Hgb down ~ 1 gram in last 24 hours   * Left hip fracture 1/65, complicated peri/post op course following left THR.  PLAN   *  Observation, serial Hgbs.  Would continue the tube feedings vs start po depending on swallow study.  Something tells me, even if she "passes" swallow test, it will be difficult for her to take in adequate nutrition/hydration with po alone.    Azucena Freed  01/07/2016, 8:29 AM Pager: [phone]

## 2016-01-07 NOTE — Progress Notes (Signed)
If pt continues to make progress with therapy, recommend an inpt rehab consult to assess for a possible inpt rehab admission. [id number]

## 2016-01-07 NOTE — Care Management Important Message (Signed)
Important Message  Patient Details  Name: Bonnie Salazar MRN: [id number] Date of Birth: 1941/03/03   Medicare Important Message Given:  Yes    Loann Quill 01/07/2016, 9:51 AM

## 2016-01-07 NOTE — Progress Notes (Signed)
Speech Language Pathology Treatment: Dysphagia  Patient Details Name: Bonnie Salazar MRN: [id number] DOB: Oct 19, 1940 Today's Date: 01/07/2016 Time: [id number] SLP Time Calculation (min) (ACUTE ONLY): 14 min  Assessment / Plan / Recommendation Clinical Impression  Pt has multiple swallows with all consistencies administered, but only has immediate coughing after spoonfuls of water. Intermittent facial grimacing noted, although pt does not endorse pain with swallowing. Recommend to proceed with MBS to determine readiness for PO diet.   HPI HPI: Pt is a 75 y.o. female without significant PMH, admitted 6/15 after sustaining a fall with L femoral neck fracture. Pt was intubated 6/16 for surgery, extubated 6/17. On 6/18 pt with sudden drop in BP/ back pain while on TPA, then PEA arrest and required resuscitation and was re-intubated, extubated 6/23. Noted to have strong cough and able to speak name. Most recent CXR showed persistent atelectasis versus consolidation LLL. Bedside swallow eval ordered due to prolonged/ multiple intubations.       SLP Plan  New goals to be determined pending instrumental study     Recommendations  Diet recommendations: NPO Medication Administration: Via alternative means             Oral Care Recommendations: Oral care QID Follow up Recommendations: Inpatient Rehab Plan: New goals to be determined pending instrumental study     GO               Germain Osgood, M.A. CCC-SLP [phone]  Germain Osgood 01/07/2016, 10:23 AM

## 2016-01-07 NOTE — Progress Notes (Signed)
PROGRESS NOTE  Bonnie Salazar  [id number] DOB: 13-Sep-1940 DOA: 12/25/2015 PCP: [name]   Brief Narrative:  75 y.o. F with no significant past medical history who sustained an accidental fall at home. She was found to have suffered a femur fx, and was taken to the OR for operative repair 6/16. The initial repair appeared uneventful and she was extubated in the PACU. Shortly thereafter, she complained of abdominal pain and was noted to have a tense abdomen. CT scan showed extravasation into the LLQ; she was taken to IR and underwent covered stent of the distal external iliac artery to the inguinal ligament. Vascular Surgery was consulted. There were no signs of distal ischemia on his exam. She received 11u pRBC, 7 FFP, 2 PLT pheresis, 2 cryoprecipitate. She was transferred to the ICU on mechanical ventilation. She was noted to have B BPE, and received thrombolysis.  On 6/18 she suffered a PEA arrest, as well as further bleeding.An IVC filter was eventually placed.     Assessment & Plan:  GI bleed - melena -6/27 with significant melena/diarrhea -Ischemic colitis? (Acute bleed with leukocytosis) - Consulted GI - no further bleeding and therefore plan is to watch without plans for further intervention at this time   Left femoral neck fracture after accidental fall -S/P repair by ortho - needs rehabilitation  Acute Hypoxic Respiratory Failure  -Secondary to bilateral PE -Titrate O2 to maintain SPO2> 92% - saturations stable at this time  RA thrombus -propagated from right pelvic vein compression from hematoma > migrated and now with BILATERAL PE -S/P Thrombolysis   Acute bilateral PE > RV strain  -has IVC filter  PEA Arrest on 6/18 ~10 minutes -Occurred Post thrombolysis  -Most likely secondary demand ischemia / severe anemia   Paroxysmal atrial fibrillation / severe MR -All anticoagulants on hold secondary to patient's multiple sites of bleeding -heparin drip  stopped on 6/21 -HR not yet at goal - adjust medical tx and follow   Arterial hemorrhage s/p stenting and requiring multiple transfusions  -6/16 transfuse to 14 units PRBC, 8 units FFP, 2 units cryoprecipitate, 2 units platelets -6/18 transfused 2 units PRBC, 8 units FFP -6/21 transfused 2 units PRBC -Hemoglobin stable at this time  Hypovolemic Shock - resolved 6/21 -BP stable   Hypokalemia -Replace and follow  Hypernatremia -Increase free water and follow  Recent Labs Lab 01/04/16 1405 01/05/16 0335 01/05/16 1500 01/06/16 0506 01/07/16 0540  NA 144 146* 151* 155* 155*   Protein Calorie Malnutrition -TF to resume now that NG replaced  DVT prophylaxis: SCDs Code Status: Full Family Communication: Spoke with husband and son at bedside Disposition Plan: SNF   Consultants:  Orthopedics surgery General surgery  IR Cardiology   Procedures/Significant Events:  6/16 TOTAL HIP ARTHROPLASTY;Lt ext iliac artery bleed/pelvic hematoma 6/16 CT pelvis with contrast;Active bleeding in the pelvis. Originating fm Proximal left common femoral artery/distal left external iliac artery. Bleeding near origin of the left inferior epigastric artery.-Large amount of blood and hematoma formation throughout the abdomen and pelvis. 6/16 left lower extremity angiogram; emergent covered stent embolization of distal external iliac artery branch vessel pseudoaneurysm/avulsion, with placement of stent graft 17m x 5cm viabahn  ECHO 6/17 >> LVEF= 50-55%; - Mitral valve: Moderate prolapse w/ severe regurgitation. - Left atrium: mildly dilated.- Right ventricle: mildly dilated. 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 CT chest 6/17 >> Bilateral pulmonary emboli, nearly occlusive in the left lower lobe  segmental branches, involving all of the segmental pulmonary arteries. Evidence of right heart strain with right/left heart ratio> 1. -Heterogeneous  area of hypoattenuation within the right atrium may represent right atrial clot versus mixing of non-opacified blood from the inferior vena cava. Bilateral pleural effusions and atelectatic changes of bilateral lung bases. LE [place] 6/17 >> negative for DVT ECHO 6/18 >> LVEF 55-60%, normal wall motion, moderate MVP, mod to severe MR, RA mildly dilated, RA with large moblie pedunculated and irregular mass that prolapses through the TV c/w thrombus CT ABD/Pelvis 6/18 >> no evidence of new intraperitoneal or pelvic hemorrhage, increasing hematoma in left proximal thigh, decreasing volume of pulmonary embolus in the bilateral lower lobes 6/18 TPA for right atrial thrombus 6/18; PEA arrest CT Head 6/18 >> No CT evidence of acute intracranial abnormality ECHO 6/19 >> LVEF 50-55%, normal wall motion, no RWMA, previous RA mass no longer demonstrated on this ECHO, mild increase in PA systolic pressure 3/15 infrarenal IVC filter placement   Antimicrobials: 6/20 Zosyn > 6/26 6/21 Vancomycin > 6/23   Subjective: The patient is withdrawn but does interact.  She has been somewhat aggravated by the need to replace her NG tube today.  She denies abdominal pain chest pain or shortness of breath.   Objective: Filed Vitals:   01/07/16 0800 01/07/16 0900 01/07/16 1000 01/07/16 1100  BP: 125/65 122/59 124/74 118/52  Pulse: 94 93 109 106  Temp:    98.8 F (37.1 C)  TempSrc:    Oral  Resp: _0 33  Height:      Weight:      SpO2: 97% 96% 97% 97%    Intake/Output Summary (Last 24 hours) at 01/07/16 1533 Last data filed at 01/07/16 1000  Gross per 24 hour  Intake 1463.67 ml  Output   1800 ml  Net -336.33 ml   Filed Weights   01/05/16 1800 01/06/16 0501 01/07/16 0421  Weight: 61.4 kg (135 lb 5.8 oz) 59.2 kg (130 lb 8.2 oz) 59.6 kg (131 lb 6.3 oz)    Examination: General: No acute respiratory distress at rest in bed  Lungs: Clear to auscultation bilaterally without wheezes or  crackles Cardiovascular: Irreg irreg - rate ~110bpm - no gallup or rub  Abdomen: Nontender, nondistended, soft, bowel sounds positive, no rebound, no ascites, no appreciable mass Extremities: No significant cyanosis, clubbing, or edema bilateral lower extremities   CBC:  Recent Labs Lab 01/04/16 0400 01/05/16 0335 01/05/16 0823 01/06/16 0506 01/06/16 1430 01/07/16 0540  WBC 20.5* 18.7* 20.1* 20.4*  --  16.5*  NEUTROABS  --   --   --  18.0*  --   --   HGB 11.0* 11.0* 11.0* 11.3* 10.5* 10.2*  HCT 34.9* 35.3* 36.1 36.0 35.2* 33.7*  MCV 93.3 95.1 95.8 99.4  --  98.0  PLT 234 246 257 260  --  400   Basic Metabolic Panel:  Recent Labs Lab 01/03/16 0449  01/04/16 1030 01/04/16 1405 01/04/16 1506 01/05/16 0335 01/05/16 1500 01/06/16 0506 01/07/16 0540  NA 137  < >  --  144  --  146* 151* 155* 155*  K 2.5*  < >  --  3.0*  --  2.0* 3.0* 3.2* 3.1*  CL 83*  < >  --  104  --  105 112* 119* 118*  CO2 35*  < >  --  29  --  _1 GLUCOSE 149*  < >  --  173*  --  168* 123* 194* 113*  BUN 43*  < >  --  70*  --  84* 90* 82* 63*  CREATININE 1.18*  < >  --  1.21*  --  1.07* 1.09* 1.00 0.79  CALCIUM 10.8*  < >  --  10.2  --  10.2 10.4* 10.0 9.5  MG 2.3  --  1.3*  --  2.9* 2.9* 3.0*  --  2.6*  PHOS 2.8  --  1.5*  --  3.1 3.0 2.1*  --   --   < > = values in this interval not displayed. GFR: Estimated Creatinine Clearance: 58 mL/min (by C-G formula based on Cr of 0.79).   Liver Function Tests:  Recent Labs Lab 01/01/16 0434 01/06/16 0506  AST  --  48*  ALT  --  33  ALKPHOS  --  78  BILITOT  --  2.0*  PROT  --  7.3  ALBUMIN 3.4* 4.2   Coagulation Profile:  Recent Labs Lab 01/06/16 1430  INR 1.29   Cardiac Enzymes:  Recent Labs Lab 01/02/16 1803 01/03/16 0449  TROPONINI 0.26* 0.23*   CBG:  Recent Labs Lab 01/06/16 2042 01/06/16 2340 01/07/16 0358 01/07/16 0738 01/07/16 1244  GLUCAP 147* 111* 149* 127* 159*   Urine analysis:    Component Value  Date/Time   COLORURINE YELLOW 01/06/2016 0610   APPEARANCEUR CLOUDY* 01/06/2016 0610   LABSPEC 1.026 01/06/2016 0610   PHURINE 6.0 01/06/2016 0610   GLUCOSEU NEGATIVE 01/06/2016 0610   HGBUR LARGE* 01/06/2016 0610   BILIRUBINUR NEGATIVE 01/06/2016 0610   KETONESUR NEGATIVE 01/06/2016 0610   PROTEINUR 100* 01/06/2016 0610   NITRITE NEGATIVE 01/06/2016 0610   LEUKOCYTESUR NEGATIVE 01/06/2016 0610    Recent Results (from the past 240 hour(s))  Culture, blood (routine x 2)     Status: None   Collection Time: 12/30/15  6:24 PM  Result Value Ref Range Status   Specimen Description BLOOD LEFT HAND  Final   Special Requests IN PEDIATRIC BOTTLE 2CC  Final   Culture NO GROWTH 5 DAYS  Final   Report Status 01/04/2016 FINAL  Final  Culture, blood (routine x 2)     Status: None   Collection Time: 12/30/15  6:36 PM  Result Value Ref Range Status   Specimen Description BLOOD LEFT HAND  Final   Special Requests IN PEDIATRIC BOTTLE 1CC  Final   Culture NO GROWTH 5 DAYS  Final   Report Status 01/04/2016 FINAL  Final  Culture, respiratory (NON-Expectorated)     Status: None   Collection Time: 12/31/15 12:10 PM  Result Value Ref Range Status   Specimen Description TRACHEAL ASPIRATE  Final   Special Requests Normal  Final   Gram Stain   Final    ABUNDANT WBC PRESENT,BOTH PMN AND MONONUCLEAR NO ORGANISMS SEEN    Culture NO GROWTH 2 DAYS  Final   Report Status 01/02/2016 FINAL  Final     Scheduled Meds: . antiseptic oral rinse  7 mL Mouth Rinse QID  . chlorhexidine gluconate (SAGE KIT)  15 mL Mouth Rinse BID  . free water  200 mL Per Tube Q8H  . insulin aspart  0-15 Units Subcutaneous Q4H  . metoprolol  5 mg Intravenous Q6H  . pantoprazole sodium  40 mg Per Tube Daily   Continuous Infusions: . diltiazem (CARDIZEM) infusion Stopped (01/04/16 0220)  . feeding supplement (VITAL AF 1.2 CAL) Stopped (01/07/16 0400)     LOS: 13 days    Time spent:  35 minutes  Cherene Altes,  MD [facility] Office  [phone] Pager - Text Page per Amion as per below:  On-Call/Text Page:      [email or web address]      password [email or web address]  If 7PM-7AM, please contact night-coverage [email or web address] Password [facility] 01/07/2016, 3:33 PM

## 2016-01-07 NOTE — Progress Notes (Signed)
CSW spoke with patients husband concerning PT recommendation for CIR or SNF pending on progress.  Pt husband states that his wife wants to go home and he will set up home health to accommodate whatever needs she has when ready for DC  CSW informed RNCM of husband preference for home- CSW signing off  Domenica Reamer, [place] Worker [phone]

## 2016-01-07 NOTE — Progress Notes (Signed)
MBSS complete. Full report located under chart review in imaging section.  Lesslie Mckeehan Paiewonsky, M.A. CCC-SLP [phone]  

## 2016-01-07 NOTE — Progress Notes (Addendum)
Nutrition Consult/Follow Up   DOCUMENTATION CODES:   Not applicable  INTERVENTION:    Once CORTRAK re-placed, restart Vital AF 1.2 formula at 25 ml/hr and increase by 10 ml every 4 hours back to goal rate of 55 ml/hr  TF regimen providing 1584 kcals, 99 gm protein, 1071 ml free water daily  NUTRITION DIAGNOSIS:   Inadequate oral intake related to inability to eat, dysphagia as evidenced by NPO status, ongoing  GOAL:   Patient will meet greater than or equal to 90% of their needs, currently unmet  MONITOR:   Labs, Weight trends, TF tolerance, I & O's, Diet advancement  ASSESSMENT:   75 y.o. female who complains of Left hip pain s/p fall at home. No LOC. She tripped on a drawer. Pain was rated as a 10/10 at the time of injury. No CP, SOB. XRays show left femoral neck fracture.  Pt pulled out [place] small bore feeding tube early this AM. S/p MBSS >> SLP continuing to recommend NPO status. RD consulted to change TF formula with less sugar; pt also having diarrhea. Vital AF 1.2 has least amount of CHO gm per L on TF formulary and is semi-elemental/peptide-based >> discussed with [facility] PA-C. Pt also receiving free water flushes at 200 ml every 8 hours.  Diet Order:  Diet NPO time specified  Skin:  Reviewed, no issues  Last BM:  6/27  Height:   Ht Readings from Last 1 Encounters:  12/27/15 5\' 7"  (1.702 m)    Weight:   Wt Readings from Last 1 Encounters:  01/07/16 131 lb 6.3 oz (59.6 kg)    Ideal Body Weight:  61.36 kg  BMI:  Body mass index is 20.57 kg/(m^2).  Estimated Nutritional Needs:   Kcal:  1500-1700  Protein:  85-100 gm  Fluid:  1.5-1.7 L  EDUCATION NEEDS:   No education needs identified at this time  Arthur Holms, RD, LDN Pager #: [phone] After-Hours Pager #: [phone]

## 2016-01-08 DIAGNOSIS — R5381 Other malaise: Secondary | ICD-10-CM

## 2016-01-08 LAB — CBC
HCT: 36.3 % (ref 36.0–46.0)
HEMOGLOBIN: 10.8 g/dL — AB (ref 12.0–15.0)
MCH: 30.1 pg (ref 26.0–34.0)
MCHC: 29.8 g/dL — AB (ref 30.0–36.0)
MCV: 101.1 fL — AB (ref 78.0–100.0)
Platelets: 316 10*3/uL (ref 150–400)
RBC: 3.59 MIL/uL — AB (ref 3.87–5.11)
RDW: 19.8 % — ABNORMAL HIGH (ref 11.5–15.5)
WBC: 16.1 10*3/uL — ABNORMAL HIGH (ref 4.0–10.5)

## 2016-01-08 LAB — COMPREHENSIVE METABOLIC PANEL
ALK PHOS: 73 U/L (ref 38–126)
ALT: 47 U/L (ref 14–54)
ANION GAP: 9 (ref 5–15)
AST: 55 U/L — ABNORMAL HIGH (ref 15–41)
Albumin: 3.4 g/dL — ABNORMAL LOW (ref 3.5–5.0)
BILIRUBIN TOTAL: 1.9 mg/dL — AB (ref 0.3–1.2)
BUN: 58 mg/dL — ABNORMAL HIGH (ref 6–20)
CALCIUM: 9.6 mg/dL (ref 8.9–10.3)
CO2: 28 mmol/L (ref 22–32)
Chloride: 122 mmol/L — ABNORMAL HIGH (ref 101–111)
Creatinine, Ser: 0.86 mg/dL (ref 0.44–1.00)
Glucose, Bld: 160 mg/dL — ABNORMAL HIGH (ref 65–99)
Potassium: 3 mmol/L — ABNORMAL LOW (ref 3.5–5.1)
SODIUM: 159 mmol/L — AB (ref 135–145)
TOTAL PROTEIN: 6.6 g/dL (ref 6.5–8.1)

## 2016-01-08 LAB — BASIC METABOLIC PANEL
ANION GAP: 4 — AB (ref 5–15)
BUN: 49 mg/dL — ABNORMAL HIGH (ref 6–20)
CALCIUM: 9.2 mg/dL (ref 8.9–10.3)
CO2: 27 mmol/L (ref 22–32)
Chloride: 128 mmol/L — ABNORMAL HIGH (ref 101–111)
Creatinine, Ser: 0.71 mg/dL (ref 0.44–1.00)
Glucose, Bld: 161 mg/dL — ABNORMAL HIGH (ref 65–99)
POTASSIUM: 3.5 mmol/L (ref 3.5–5.1)
Sodium: 159 mmol/L — ABNORMAL HIGH (ref 135–145)

## 2016-01-08 LAB — GLUCOSE, CAPILLARY
GLUCOSE-CAPILLARY: 154 mg/dL — AB (ref 65–99)
GLUCOSE-CAPILLARY: 171 mg/dL — AB (ref 65–99)
GLUCOSE-CAPILLARY: 121 mg/dL — AB (ref 65–99)
Glucose-Capillary: 121 mg/dL — ABNORMAL HIGH (ref 65–99)
Glucose-Capillary: 136 mg/dL — ABNORMAL HIGH (ref 65–99)
Glucose-Capillary: 202 mg/dL — ABNORMAL HIGH (ref 65–99)

## 2016-01-08 LAB — MAGNESIUM
MAGNESIUM: 2.6 mg/dL — AB (ref 1.7–2.4)
Magnesium: 2.6 mg/dL — ABNORMAL HIGH (ref 1.7–2.4)

## 2016-01-08 MED ORDER — POTASSIUM CHLORIDE 10 MEQ/100ML IV SOLN
10.0000 meq | INTRAVENOUS | Status: AC
  Administered 2016-01-08 – 2016-01-09 (×4): 10 meq via INTRAVENOUS
  Filled 2016-01-08 (×4): qty 100

## 2016-01-08 MED ORDER — POTASSIUM CHLORIDE 10 MEQ/100ML IV SOLN
10.0000 meq | Freq: Once | INTRAVENOUS | Status: AC
  Administered 2016-01-08: 10 meq via INTRAVENOUS
  Filled 2016-01-08: qty 100

## 2016-01-08 MED ORDER — FREE WATER
200.0000 mL | Status: DC
  Administered 2016-01-08 (×4): 200 mL

## 2016-01-08 MED ORDER — POTASSIUM CHLORIDE 10 MEQ/100ML IV SOLN
10.0000 meq | INTRAVENOUS | Status: AC
Start: 2016-01-08 — End: 2016-01-08
  Administered 2016-01-08 (×4): 10 meq via INTRAVENOUS
  Filled 2016-01-08 (×4): qty 100

## 2016-01-08 MED ORDER — FREE WATER
300.0000 mL | Status: DC
  Administered 2016-01-09 – 2016-01-13 (×25): 300 mL

## 2016-01-08 NOTE — Progress Notes (Addendum)
PROGRESS NOTE    Bonnie Salazar  HAL:[id number] DOB: 1941-03-16 DOA: 12/25/2015 PCP: [name]   Brief Narrative:  75 y.o. WF without significant past medical history  She sustained an accidental fall at home. She tripped on a open drawer, causing her fall. She subsequently developed left hip pain, 10/10 in intensity with any movement. No LOC. No chest pain or shortness of breath. No swelling. No palpitations. She was taken to the OR for operative repair 6/16. The initial repair appeared uneventful and she was extubated in the PACU. Shortly thereafter, she complained of abdominal pain and was noted to have a tense abdomen. CT scan showed extravasation into the LLQ; she was taken to IR and underwent covered stent of the distal external iliac artery to the inguinal ligament. Vascular surgery was also consulted. No signs of distal ischemia on his exam. There is some concern for abdominal compartment syndrome, as well. MTP was initiated and she received 11u pRBC, 7 FFP, 2 PLT pheresis, 2 cryoprecipitate. Returned to ICU on mechanical ventilation. She suffered a PEA arrest 6/18. ICU course complicated by further bleeding. IVC filter placed.    Assessment & Plan:   Principal Problem:   Fracture of femoral neck, left (HCC) Active Problems:   Hip fracture (HCC)   Leukocytosis   Dehydration   Atrial thrombus (HCC)   Acute pulmonary embolism (HCC)   Acute respiratory failure (HCC)   Hemorrhagic shock   Cardiac arrest (HCC)   Acute pulmonary embolus (HCC)   Atrial mass   Bleeding   Pulmonary embolus, left (HCC)   Pulmonary embolus, right (HCC)   Acute respiratory failure with hypoxia (HCC)   PEA (Pulseless electrical activity) (HCC)   Right atrial thrombus (HCC)   Arterial hemorrhage   Hypovolemic shock (HCC)   Hypokalemia   Hypernatremia   Gastrointestinal hemorrhage with melena   Blood per rectum   Left femoral neck fracture after accidental fall -Resolved.  S/P repair by orthopedic  Acute Hypoxic Respiratory Failure  -Secondary to bilateral PE : Resolved -Titrate O2 to maintain SPO2> 92%  Positive Acute bilateral PE-->RV strain  -See acute respiratory failure  PEA Arrest on 6/18(~10 minutes)/Troponin elevation -Occurred Post thrombolysis   -Most likely secondary demand ischemia   Paroxysmal atrial fibrillation/severe MR -All anticoagulants on hold secondary to patient's multiple sites of bleeding. -Review chart appears heparin drip stopped on 6/21 -HR well controlled -Continue Cardizem drip -Increase metoprolol IV to 10 mg QID  RA thrombus - likely propagated from right pelvic vein compression from hematoma-->migrated and now with BILATERAL PE -S/P, Thrombolysis   Heart murmur  Arterial hemorrhage s/p stenting and requiring massive transfusion  -See significant events -6/16 transfuse to 14 units PRBC, 8 units FFP, 2 units cryoprecipitate, 2 units platelets -6/18 transfused 2 units PRBC, 8 units FFP -6/21 transfused 2 units PRBC  Recent Labs Lab 01/06/16 0506 01/06/16 1430 01/07/16 0540 01/08/16 0519 01/09/16 0440  HGB 11.3* 10.5* 10.2* 10.8* 10.3*  -Stable  GI bleed  -Review chart; appears patient last received heparin on 6/21 -6/27 Overnight patient with significant melena/diarrhea -H/H Daily-Protonix 40 mg per tube BID -GI does not believe Ischemic colitis, Recommends watchful waiting. Appears bleeding has stopped  -See atrial hemorrhage  Hypovolemic Shock - resolved 6/21 -IVC filter in place  Hypokalemia -Potassium goal>4 -Potassium IV 50 mEq -Repeat potassium <4; potassium IV 40 mEq   Hypernatremia -Free water 333m  q 4hr    Recent Labs Lab 01/06/16 0506 01/07/16 0540 01/08/16 0519 01/08/16  1902 01/09/16 0440  NA 155* 155* 159* 159* 157*    Protein Calorie Malnutrition -TF  Anxiety and post-op pain      DVT prophylaxis: SCD Code Status: Full Family Communication: Spoke with husband  at length Disposition Plan: SNF   Consultants:  Dr. Carole Binning Orthopedics surgery Dr.Matthew P[facility]an [facility] D.O. Corrie Mckusick  IR [facility]    Procedures/Significant Events:  6/16 TOTAL HIP ARTHROPLASTY;Lt ext iliac artery bleed/pelvic hematoma 6/16 CT pelvis with contrast;Active bleeding in the pelvis. Originating fm Proximal left common femoral artery/distal left external iliac artery. Bleeding near origin of the left inferior epigastric artery.-Large amount of blood and hematoma formation throughout the abdomen and pelvis. 6/16 left lower extremity angiogram; emergent covered stent embolization of distal external iliac artery branch vessel pseudoaneurysm/avulsion, with placement of stent graft 50m x 5cm viabahn  ECHO 6/17 >> LVEF= 50-55%; - Mitral valve: Moderate prolapse w/ severe regurgitation. - Left atrium: mildly dilated.- Right ventricle: mildly dilated. 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 CT chest 6/17 >> Bilateral pulmonary emboli, nearly occlusive in the left lower lobe segmental branches, involving all of the segmental pulmonary arteries. Evidence of right heart strain with right/left heart ratio> 1. -Heterogeneous area of hypoattenuation within the right atrium may represent right atrial clot versus mixing of non-opacified blood from the inferior vena cava. Bilateral pleural effusions and atelectatic changes of bilateral lung bases. LE [place]6/17 >> negative for DVT ECHO 6/18 >> LVEF 55-60%, normal wall motion, moderate MVP, mod to severe MR, RA mildly dilated, RA with large moblie pedunculated and irregular mass that prolapses through the TV c/w thrombus CT ABD/Pelvis 6/18 >> no evidence of new intraperitoneal or pelvic hemorrhage, increasing hematoma in left proximal thigh, decreasing volume of pulmonary embolus in the bilateral lower lobes 6/18 TPA for right atrial thrombus 6/18;  PEA arrest CT Head 6/18 >> No CT evidence of acute intracranial abnormality ECHO 6/19 >> LVEF 50-55%, normal wall motion, no RWMA, previous RA mass no longer demonstrated on this ECHO, mild increase in PA systolic pressure 65/63infrarenal IVC filter placement      Cultures 6/20 blood left hand 2 negative   6/21 tracheal aspirate negative   Antimicrobials: Perioperative Ancef 6/20 Zosyn >>> 6/26 6/21 Vancomycin >> 6/23   Devices    LINES / TUBES:  Foley 6/16  PIV ETT 6/16-->6/17 >> 6/18 >> 6/23 L [place] DLC 6/16 >>    Continuous Infusions: . diltiazem (CARDIZEM) infusion Stopped (01/04/16 0220)  . feeding supplement (VITAL AF 1.2 CAL) 1,000 mL (01/08/16 1621)     Subjective: 6/29 afebrile overnight, Patient will not verbalize but will nod yes/no to questions. Follows commands. Extremely weak.    Objective: Filed Vitals:   01/08/16 1958 01/08/16 2000 01/08/16 2100 01/09/16 0427  BP: 118/68 100/90 127/72 113/67  Pulse: 98  102 94  Temp: 98.6 F (37 C)   97.5 F (36.4 C)  TempSrc: Oral   Oral  Resp: '24 31 30 29  '$ Height:      Weight:    60.4 kg (133 lb 2.5 oz)  SpO2: 100%  99% 97%    Intake/Output Summary (Last 24 hours) at 01/09/16 0753 Last data filed at 01/09/16 0428  Gross per 24 hour  Intake   1905 ml  Output   1175 ml  Net    730 ml   Filed Weights   01/07/16 0421 01/08/16 0413  01/09/16 0427  Weight: 59.6 kg (131 lb 6.3 oz) 60.5 kg (133 lb 6.1 oz) 60.4 kg (133 lb 2.5 oz)    Examination:  General: Alert, will not Verbalize answers to questions but nods yes/no, follows commands, No acute respiratory distress Eyes: negative scleral hemorrhage, negative anisocoria, negative icterus ENT: Negative Runny nose, negative gingival bleeding, Neck:  Negative scars, masses, torticollis, lymphadenopathy, JVD, left subclavian CVL covering clean. Lungs: Clear to auscultation bilaterally without wheezes or crackles Cardiovascular: Regular rate and rhythm  without murmur gallop or rub normal S1 and S2 Abdomen:  Positive abdominal pain RLQ/LLQ ,  positive distention but soft, positive soft, bowel sounds, no rebound, no ascites, no appreciable mass Extremities: No significant cyanosis, clubbing, or edema bilateral lower extremities Skin: Negative rashes, lesions, ulcers GI; small external hemorrhoid 7:00 position mild bleeding. BRBPR squirting onto sheets. Central nervous system:  Cranial nerves II through XII intact, tongue/uvula midline, all extremities muscle strength 5/5, sensation intact throughout, unable to fully evaluate .     Data Reviewed: Care during the described time interval was provided by me .  I have reviewed this patient's available data, including medical history, events of note, physical examination, and all test results as part of my evaluation. I have personally reviewed and interpreted all radiology studies.  CBC:  Recent Labs Lab 01/05/16 0823 01/06/16 0506 01/06/16 1430 01/07/16 0540 01/08/16 0519 01/09/16 0440  WBC 20.1* 20.4*  --  16.5* 16.1* 15.6*  NEUTROABS  --  18.0*  --   --   --  12.8*  HGB 11.0* 11.3* 10.5* 10.2* 10.8* 10.3*  HCT 36.1 36.0 35.2* 33.7* 36.3 35.2*  MCV 95.8 99.4  --  98.0 101.1* 102.9*  PLT 257 260  --  274 316 440   Basic Metabolic Panel:  Recent Labs Lab 01/03/16 0449  01/04/16 1030  01/04/16 1506 01/05/16 0335 01/05/16 1500 01/06/16 0506 01/07/16 0540 01/08/16 0519 01/08/16 1902 01/09/16 0440  NA 137  < >  --   < >  --  146* 151* 155* 155* 159* 159* 157*  K 2.5*  < >  --   < >  --  2.0* 3.0* 3.2* 3.1* 3.0* 3.5 3.7  CL 83*  < >  --   < >  --  105 112* 119* 118* 122* 128* 125*  CO2 35*  < >  --   < >  --  '30 28 27 26 28 27 25  '$ GLUCOSE 149*  < >  --   < >  --  168* 123* 194* 113* 160* 161* 166*  BUN 43*  < >  --   < >  --  84* 90* 82* 63* 58* 49* 50*  CREATININE 1.18*  < >  --   < >  --  1.07* 1.09* 1.00 0.79 0.86 0.71 0.70  CALCIUM 10.8*  < >  --   < >  --  10.2 10.4* 10.0  9.5 9.6 9.2 9.1  MG 2.3  --  1.3*  --  2.9* 2.9* 3.0*  --  2.6* 2.6* 2.6* 2.5*  PHOS 2.8  --  1.5*  --  3.1 3.0 2.1*  --   --   --   --   --   < > = values in this interval not displayed. GFR: Estimated Creatinine Clearance: 58.8 mL/min (by C-G formula based on Cr of 0.7). Liver Function Tests:  Recent Labs Lab 01/06/16 0506 01/08/16 0519  AST 48* 55*  ALT 33  47  ALKPHOS 78 73  BILITOT 2.0* 1.9*  PROT 7.3 6.6  ALBUMIN 4.2 3.4*   No results for input(s): LIPASE, AMYLASE in the last 168 hours. No results for input(s): AMMONIA in the last 168 hours. Coagulation Profile:  Recent Labs Lab 01/06/16 1430  INR 1.29   Cardiac Enzymes:  Recent Labs Lab 01/02/16 1803 01/03/16 0449  TROPONINI 0.26* 0.23*   BNP (last 3 results) No results for input(s): PROBNP in the last 8760 hours. HbA1C: No results for input(s): HGBA1C in the last 72 hours. CBG:  Recent Labs Lab 01/08/16 1101 01/08/16 1355 01/08/16 2003 01/08/16 2357 01/09/16 0400  GLUCAP 171* 136* 121* 162* 139*   Lipid Profile: No results for input(s): CHOL, HDL, LDLCALC, TRIG, CHOLHDL, LDLDIRECT in the last 72 hours. Thyroid Function Tests: No results for input(s): TSH, T4TOTAL, FREET4, T3FREE, THYROIDAB in the last 72 hours. Anemia Panel: No results for input(s): VITAMINB12, FOLATE, FERRITIN, TIBC, IRON, RETICCTPCT in the last 72 hours. Urine analysis:    Component Value Date/Time   COLORURINE YELLOW 01/06/2016 0610   APPEARANCEUR CLOUDY* 01/06/2016 0610   LABSPEC 1.026 01/06/2016 0610   PHURINE 6.0 01/06/2016 0610   GLUCOSEU NEGATIVE 01/06/2016 0610   HGBUR LARGE* 01/06/2016 0610   BILIRUBINUR NEGATIVE 01/06/2016 0610   KETONESUR NEGATIVE 01/06/2016 0610   PROTEINUR 100* 01/06/2016 0610   NITRITE NEGATIVE 01/06/2016 0610   LEUKOCYTESUR NEGATIVE 01/06/2016 0610   Sepsis Labs: '@LABRCNTIP'$ (procalcitonin:4,lacticidven:4)  ) Recent Results (from the past 240 hour(s))  Culture, blood (routine x 2)      Status: None   Collection Time: 12/30/15  6:24 PM  Result Value Ref Range Status   Specimen Description BLOOD LEFT HAND  Final   Special Requests IN PEDIATRIC BOTTLE 2CC  Final   Culture NO GROWTH 5 DAYS  Final   Report Status 01/04/2016 FINAL  Final  Culture, blood (routine x 2)     Status: None   Collection Time: 12/30/15  6:36 PM  Result Value Ref Range Status   Specimen Description BLOOD LEFT HAND  Final   Special Requests IN PEDIATRIC BOTTLE [place]  Final   Culture NO GROWTH 5 DAYS  Final   Report Status 01/04/2016 FINAL  Final  Culture, respiratory (NON-Expectorated)     Status: None   Collection Time: 12/31/15 12:10 PM  Result Value Ref Range Status   Specimen Description TRACHEAL ASPIRATE  Final   Special Requests Normal  Final   Gram Stain   Final    ABUNDANT WBC PRESENT,BOTH PMN AND MONONUCLEAR NO ORGANISMS SEEN    Culture NO GROWTH 2 DAYS  Final   Report Status 01/02/2016 FINAL  Final         Radiology Studies: Dg Abd Portable 1v  01/07/2016  CLINICAL DATA:  Feeding tube placement. EXAM: PORTABLE ABDOMEN - 1 VIEW COMPARISON:  None. FINDINGS: Supine portable abdomen at 1626 hours shows a small bore feeding catheter with the tip at or just distal to the pylorus. There is no NG tube visible on this study. IVC filter is evident in situ. Contrast material is visualized within small bowel loops. IMPRESSION: 1. Feeding tube tip is at or just distal to the pylorus. 2. No NG tube visible on this study. Electronically Signed   By: Misty Stanley M.D.   On: 01/07/2016 16:34   Dg Swallowing Func-speech Pathology  01/07/2016  Objective Swallowing Evaluation: Type of Study: MBS-Modified Barium Swallow Study Patient Details Name: DEBORRAH MABIN MRN: [id number] Date of Birth:  1940/08/20 Today's Date: 01/07/2016 Time: SLP Start Time (ACUTE ONLY): 1002-SLP Stop Time (ACUTE ONLY): 1017 SLP Time Calculation (min) (ACUTE ONLY): 15 min Past Medical History: Past Medical History Diagnosis Date .  Medical history non-contributory  . Acute pulmonary embolism ([place]) 12/28/2015 Past Surgical History: Past Surgical History Procedure Laterality Date . Back surgery   . Total hip arthroplasty Left 12/26/2015   Procedure: TOTAL HIP ARTHROPLASTY ANTERIOR APPROACH;  Surgeon: Renette Butters, MD;  Location: [facility];  Service: Orthopedics;  Laterality: Left; HPI: Pt is a 75 y.o. female without significant PMH, admitted 6/15 after sustaining a fall with L femoral neck fracture. Pt was intubated 6/16 for surgery, extubated 6/17. On 6/18 pt with sudden drop in BP/ back pain while on TPA, then PEA arrest and required resuscitation and was re-intubated, extubated 6/23. Noted to have strong cough and able to speak name. Most recent CXR showed persistent atelectasis versus consolidation LLL. Bedside swallow eval ordered due to prolonged/ multiple intubations.  Subjective: pt alert, cooperative but not very communicative Assessment / Plan / Recommendation CHL IP CLINICAL IMPRESSIONS 01/07/2016 Therapy Diagnosis Moderate oral phase dysphagia;Moderate pharyngeal phase dysphagia Clinical Impression Pt has a moderate oropharyngeal dysphagia due to generalized weakness that impacts her strength and cohesion with oral and pharyngeal transit. A delay in swallow trigger results in deep penetration before the swallow spoonfuls of nectar thick liquids and small cup sips of honey thick liquids. Mod cues provided for cough to clear, and pt did not follow commands to attempt chin tuck posture. All consistencies tested leave moderate residue at the base of tongue and valleculae that elicit spontaneous additional swallows. Despite multiple swallows per bolus, residue is reduced but not cleared from her valleculae. As trials continue, she begins to fatigue and no longer initiates volitional swallows in an attempt to further clear her pharynx. While aspiration was not observed, her risk remains high due to weakness and fatigue. Recommend to maintain  NPO status except for meds crushed in puree. Will initiate therapeutic trials of puree and honey thick liquids by spoon with SLP only. Impact on safety and function Moderate aspiration risk;Severe aspiration risk;Risk for inadequate nutrition/hydration   CHL IP TREATMENT RECOMMENDATION 01/07/2016 Treatment Recommendations Therapy as outlined in treatment plan below   Prognosis 01/07/2016 Prognosis for Safe Diet Advancement Good Barriers to Reach Goals -- Barriers/Prognosis Comment -- CHL IP DIET RECOMMENDATION 01/07/2016 SLP Diet Recommendations NPO except meds Liquid Administration via -- Medication Administration Crushed with puree Compensations -- Postural Changes --   CHL IP OTHER RECOMMENDATIONS 01/07/2016 Recommended Consults -- Oral Care Recommendations Oral care QID Other Recommendations Have oral suction available   CHL IP FOLLOW UP RECOMMENDATIONS 01/07/2016 Follow up Recommendations Inpatient Rehab   CHL IP FREQUENCY AND DURATION 01/07/2016 Speech Therapy Frequency (ACUTE ONLY) min 2x/week Treatment Duration 2 weeks      CHL IP ORAL PHASE 01/07/2016 Oral Phase Impaired Oral - Pudding Teaspoon -- Oral - Pudding Cup -- Oral - Honey Teaspoon Weak lingual manipulation;Reduced posterior propulsion;Lingual/palatal residue;Delayed oral transit Oral - Honey Cup Weak lingual manipulation;Reduced posterior propulsion;Lingual/palatal residue;Delayed oral transit Oral - Nectar Teaspoon Weak lingual manipulation;Reduced posterior propulsion;Lingual/palatal residue;Delayed oral transit Oral - Nectar Cup -- Oral - Nectar Straw -- Oral - Thin Teaspoon -- Oral - Thin Cup -- Oral - Thin Straw -- Oral - Puree Weak lingual manipulation;Reduced posterior propulsion;Lingual/palatal residue;Delayed oral transit Oral - Mech Soft -- Oral - Regular -- Oral - Multi-Consistency -- Oral - Pill -- Oral Phase - Comment --  CHL IP PHARYNGEAL PHASE 01/07/2016 Pharyngeal Phase Impaired Pharyngeal- Pudding Teaspoon -- Pharyngeal -- Pharyngeal-  Pudding Cup -- Pharyngeal -- Pharyngeal- Honey Teaspoon Delayed swallow initiation-pyriform sinuses;Reduced anterior laryngeal mobility;Reduced laryngeal elevation;Reduced tongue base retraction;Pharyngeal residue - valleculae Pharyngeal -- Pharyngeal- Honey Cup Delayed swallow initiation-pyriform sinuses;Reduced anterior laryngeal mobility;Reduced laryngeal elevation;Reduced tongue base retraction;Pharyngeal residue - valleculae;Penetration/Aspiration before swallow Pharyngeal Material enters airway, remains ABOVE vocal cords and not ejected out Pharyngeal- Nectar Teaspoon Delayed swallow initiation-pyriform sinuses;Reduced anterior laryngeal mobility;Reduced laryngeal elevation;Reduced tongue base retraction;Pharyngeal residue - valleculae;Penetration/Aspiration before swallow Pharyngeal Material enters airway, CONTACTS cords and then ejected out Pharyngeal- Nectar Cup -- Pharyngeal -- Pharyngeal- Nectar Straw -- Pharyngeal -- Pharyngeal- Thin Teaspoon -- Pharyngeal -- Pharyngeal- Thin Cup -- Pharyngeal -- Pharyngeal- Thin Straw -- Pharyngeal -- Pharyngeal- Puree Delayed swallow initiation-pyriform sinuses;Reduced anterior laryngeal mobility;Reduced laryngeal elevation;Reduced tongue base retraction;Pharyngeal residue - valleculae Pharyngeal -- Pharyngeal- Mechanical Soft -- Pharyngeal -- Pharyngeal- Regular -- Pharyngeal -- Pharyngeal- Multi-consistency -- Pharyngeal -- Pharyngeal- Pill -- Pharyngeal -- Pharyngeal Comment --  CHL IP CERVICAL ESOPHAGEAL PHASE 01/07/2016 Cervical Esophageal Phase WFL Pudding Teaspoon -- Pudding Cup -- Honey Teaspoon -- Honey Cup -- Nectar Teaspoon -- Nectar Cup -- Nectar Straw -- Thin Teaspoon -- Thin Cup -- Thin Straw -- Puree -- Mechanical Soft -- Regular -- Multi-consistency -- Pill -- Cervical Esophageal Comment -- No flowsheet data found. Germain Osgood, M.A. CCC-SLP [phone] Germain Osgood 01/07/2016, 10:43 AM                   Scheduled Meds: . antiseptic  oral rinse  7 mL Mouth Rinse QID  . chlorhexidine gluconate (SAGE KIT)  15 mL Mouth Rinse BID  . free water  300 mL Per Tube Q4H  . insulin aspart  0-15 Units Subcutaneous Q4H  . metoprolol  10 mg Intravenous Q6H  . pantoprazole sodium  40 mg Per Tube Daily   Continuous Infusions: . diltiazem (CARDIZEM) infusion Stopped (01/04/16 0220)  . feeding supplement (VITAL AF 1.2 CAL) 1,000 mL (01/08/16 1621)     LOS: 15 days    Time spent: 40 minutes    Desarai Barrack, Geraldo Docker, MD [facility] Pager [phone]   If 7PM-7AM, please contact night-coverage [email or web address] Password [email or web address] 01/09/2016, 7:53 AM

## 2016-01-08 NOTE — Care Management Note (Addendum)
Case Management Note  Patient Details  Name: Bonnie Salazar MRN: [id number] Date of Birth: 1941-06-17  Subjective/Objective:  Pt lives with spouse, was independent and walked 1-2 miles a day PTA.  Per CSW, spouse is adamant that pt does not want to rehab @ SNF.  Discussed home health options, also discussed inpatient rehab.  Pt and spouse are very interested in inpatient rehab and would like transfer there when pt is medically stable.  Spouse will be available to assist pt 24/7 when she is discharged home.                              Expected Discharge Plan:  [place]  Discharge planning Services  CM Consult  Status of Service:  In process, will continue to follow  Girard Cooter, RN 01/08/2016, 3:48 PM

## 2016-01-08 NOTE — Progress Notes (Signed)
01/05/2016 patient had blood in stool at 0815 and Dr Sherral Hammers was made aware. [facility] RN.

## 2016-01-08 NOTE — Consult Note (Signed)
Physical Medicine and Rehabilitation Consult  Reason for Consult: Debility due to multiple medical issues after hip fracture. Referring Physician:  Dr. Sherral Hammers   HPI: Bonnie Salazar is a 75 y.o. female admitted on 12/25/15 after a fall with subsequent left femoral neck fracture. She underwent left anterior hip arthroplasty by Dr. Fredonia Highland. Post op course complicated by abdominal distension with hypotension due to bleeding from distal iliac artery. She underwent distal external iliac artery stent down to inguinal ligament. She was treated with 11u PRBC, 7 units FFP, 2 units PLT pheresis and cryoprecipitate for life threatening bleed.  Dr. Hulen Skains consulted and recommended monitoring. She tolerated extubation on 06/17 and ortho recommended WBAT with ASA for DVT prophylaxis. BLE dopplers 6/17 were negative for DVT. 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 She developed respiratory distress with marked tachycardia on 06/18 requiring face mask.  CTA chest done showing bilateral PE with completely occlusive thrombus in LLL PA with nearly occlusive segmental pulmonary arteries and right heart strain.  Dr. Cyndia Bent consulted and following for input. She underwent catheter lysis of intra-atrial clot by Dr. Earleen Newport.    Post procedure developed leg, back and abdominal pain with hypotension requiring neo and  was transfused with 2 units PRBC. She was sedated and intubated for airway protection.  She developed PEA cardiac arrest later that evening requiring 10 minutes of resuscitation.  CTA abdomen/pelvis showed decrease in extraperitoneal bleed, compression of left external iliac and comon iliac veins by pelvic hematoma, no new pelvic hematoma and enlarging hematoma proximal left thigh. CT head without acute changes.  Heparin was  discontinued and IVC filter placed on 06/21. Has been treated with antibiotics for  fevers as well as IV diuresis for fluid overload. She tolerated extubation on 06/23 and continues to be NPO due to high aspiration risk. MBS done yesterday revealing  oropharyngeal dysphagia with delay in swallow, moderate residue, fatigue and aspiration.  She developed bloody stools on 06/26 and GI consulted for input. Dr. Silverio Decamp  questioned ischemic colitis v/s hemorrhoids as cause of bleeding and supportive care recommended. She continues to have hematochezia and leucocytosis but serial H/H relatively stable. Therapy evaluations done this week and CIR recommended due to debilitated state.   Husband in the room with congested cough frequently. He reports that she was active and walked 1-2 miles daily.  Supportive     Review of Systems  Unable to perform ROS: language  Eyes: Negative for blurred vision.  Respiratory: Negative for cough and shortness of breath.   Cardiovascular: Positive for chest pain. Negative for leg swelling.  Gastrointestinal: Negative for abdominal pain.       Abdominal distension/pain decreasing  Musculoskeletal: Positive for back pain and joint pain.  Neurological: Positive for weakness. Negative for headaches.      Past Medical History  Diagnosis Date  . Medical history non-contributory   . Acute pulmonary embolism ([place]) 12/28/2015    Past Surgical History  Procedure Laterality Date  . Back surgery    . 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 Hodgkin's lymphoma Mother   . Stroke Father   . Coronary artery disease Father  in his late 34s    Social History:  Married. Independent without AD PTA. Per reports that she has quit smoking. She quit smokeless tobacco use about 20 years ago. Per reports that she does not drink alcohol or use illicit drugs.     Allergies: No Known Allergies    Medications Prior to Admission  Medication Sig Dispense Refill  . CALCIUM PO Take 2 tablets by mouth daily.    . chlorhexidine (PERIDEX) 0.12 % solution 15 mLs by Mouth Rinse route 2 (two) times daily.    . Cholecalciferol (VITAMIN D PO) Take 1 capsule by mouth daily.    . fluticasone (FLONASE) 50 MCG/ACT nasal spray Place 2 sprays into the nose daily as needed for allergies.     Marland Kitchen GINKGO BILOBA PO Take 1 capsule by mouth daily.    . Misc Natural Products (GLUCOSAMINE CHONDROITIN TRIPLE) TABS Take 2 tablets by mouth daily.    . Multiple Vitamin (MULTIVITAMIN WITH MINERALS) TABS tablet Take 1 tablet by mouth daily.    . Multiple Vitamins-Minerals (ICAPS AREDS 2) CAPS Take 1 capsule by mouth 2 (two) times daily.    . naproxen sodium (ANAPROX) 220 MG tablet Take 220 mg by mouth at bedtime as needed (pain).    . Omega-3 Fatty Acids (OMEGA 3 PO) Take 1 capsule by mouth daily.      Home: Home Living Family/patient expects to be discharged to:: Inpatient rehab Living Arrangements: Spouse/significant other (retired) Available Help at Discharge: Family, Available 24 hours/day Additional Comments: Difficult hx due to low tone of voice and slow processing.  Functional History: Prior Function Level of Independence: Independent Comments: per pt she did not use a RW or cane PTA Functional Status:  Mobility: Bed Mobility Overal bed mobility: Needs Assistance Bed Mobility: Supine to Sit Supine to sit: +2 for physical assistance, Min assist Sit to supine: Max assist General bed mobility comments: Able to initiate movement of Bil LEs but requires assist to get them to EOB, assist to elevate trunk and to scoot to EOB.  Transfers Overall transfer level: Needs assistance Equipment used: Rolling walker (2 wheeled) Transfers: Sit to/from Stand Sit to Stand: Mod assist, +2 physical assistance Stand pivot transfers: Mod assist, +2 safety/equipment General  transfer comment: Assist to boost and steady w/ cues for hand placement and technique.  Pt able to correct posterior bias and flexed posture this session w/ verbal and tactile cues.  Pt able to manage RW w/ pivot. Ambulation/Gait Ambulation/Gait assistance: Mod assist, +2 safety/equipment Ambulation Distance (Feet): 10 Feet Assistive device: Rolling walker (2 wheeled) Gait Pattern/deviations: Shuffle, Decreased stride length, Trunk flexed, Narrow base of support General Gait Details: Pt shuffles feet and requires assist to steady due to posterior bias.  Moves slowly but pt motivated, taking much effort to ambulate 10 ft.  Close chair follow for safety as pt fatigues quickly. Gait velocity interpretation: <1.8 ft/sec, indicative of risk for recurrent falls    ADL: ADL Overall ADL's : Needs assistance/impaired Eating/Feeding: NPO Grooming: Wash/dry hands, Wash/dry face, Oral care, Total assistance, Sitting Grooming Details (indicate cue type and reason): requires hand over hand assist  Upper Body Bathing: Total assistance, Bed level, Sitting Lower Body Bathing: Total assistance, Sit to/from stand Upper Body Dressing : Total assistance, Sitting Lower Body Dressing: Total assistance, Bed level, Sit to/from stand Toilet Transfer: Maximal assistance, +2 for physical assistance, Stand-pivot, BSC, RW Toileting- Clothing Manipulation and Hygiene: Total assistance, Sit to/from stand Functional mobility during ADLs: Maximal assistance, +2 for  physical assistance, Rolling walker General ADL Comments: Pt requires assist due to generalized weakness   Cognition: Cognition Overall Cognitive Status: Impaired/Different from baseline Orientation Level: Oriented to person, Oriented to place, Disoriented to situation Cognition Arousal/Alertness: Awake/alert Behavior During Therapy: Flat affect Overall Cognitive Status: Impaired/Different from baseline Area of Impairment: Attention, Following commands,  Problem solving Current Attention Level: Sustained Memory: Decreased short-term memory Following Commands: Follows one step commands inconsistently, Follows one step commands with increased time Problem Solving: Slow processing, Decreased initiation General Comments: Appears to require significant effort to perform minimal motor movement/follow commands    Blood pressure 121/73, pulse 98, temperature 98.6 F (37 C), temperature source Oral, resp. rate 25, height 5\' 7"  (1.702 m), weight 60.5 kg (133 lb 6.1 oz), SpO2 100 %. Physical Exam  Vitals reviewed. Constitutional: She appears well-developed and well-nourished. She appears ill. No distress. Nasal cannula in place.  Flat affect and slow to respond  HENT:  Head: Normocephalic and atraumatic.  Mouth/Throat: Oropharynx is clear and moist.  NGT  Eyes: Conjunctivae and EOM are normal. Pupils are equal, round, and reactive to light. No scleral icterus.  Dilated pupils.  Neck: Normal range of motion. Neck supple.  Cardiovascular: Normal rate and regular rhythm.   Respiratory: Effort normal. No stridor. No tachypnea. No respiratory distress. She has rales in the right upper field. She exhibits tenderness.  GI: Soft. Bowel sounds are normal. She exhibits no distension.  Musculoskeletal: She exhibits no edema or tenderness.  Neurological: She is alert.  Anxious appearing with delayed responses. needed encouragement to phonate--able to speaks in a whisper. Able to follow simple motor commands. LLE weakness due to pain inhibition. But generally with proximal  > distal limb weakness.   Skin: Skin is dry. No rash noted. She is not diaphoretic.  Psychiatric: Her speech is not delayed. She is slowed.    Results for orders placed or performed during the hospital encounter of 12/25/15 (from the past 24 hour(s))  Glucose, capillary     Status: Abnormal   Collection Time: 01/07/16  5:19 PM  Result Value Ref Range   Glucose-Capillary 113 (H) 65 - 99  mg/dL  Glucose, capillary     Status: Abnormal   Collection Time: 01/07/16  7:57 PM  Result Value Ref Range   Glucose-Capillary 140 (H) 65 - 99 mg/dL  Glucose, capillary     Status: Abnormal   Collection Time: 01/08/16 12:21 AM  Result Value Ref Range   Glucose-Capillary 202 (H) 65 - 99 mg/dL   Comment 1 Notify RN   Glucose, capillary     Status: Abnormal   Collection Time: 01/08/16  4:04 AM  Result Value Ref Range   Glucose-Capillary 121 (H) 65 - 99 mg/dL   Comment 1 Notify RN   CBC     Status: Abnormal   Collection Time: 01/08/16  5:19 AM  Result Value Ref Range   WBC 16.1 (H) 4.0 - 10.5 K/uL   RBC 3.59 (L) 3.87 - 5.11 MIL/uL   Hemoglobin 10.8 (L) 12.0 - 15.0 g/dL   HCT 36.3 36.0 - 46.0 %   MCV 101.1 (H) 78.0 - 100.0 fL   MCH 30.1 26.0 - 34.0 pg   MCHC 29.8 (L) 30.0 - 36.0 g/dL   RDW 19.8 (H) 11.5 - 15.5 %   Platelets 316 150 - 400 K/uL  Comprehensive metabolic panel     Status: Abnormal   Collection Time: 01/08/16  5:19 AM  Result Value Ref Range  Sodium 159 (H) 135 - 145 mmol/L   Potassium 3.0 (L) 3.5 - 5.1 mmol/L   Chloride 122 (H) 101 - 111 mmol/L   CO2 28 22 - 32 mmol/L   Glucose, Bld 160 (H) 65 - 99 mg/dL   BUN 58 (H) 6 - 20 mg/dL   Creatinine, Ser 0.86 0.44 - 1.00 mg/dL   Calcium 9.6 8.9 - 10.3 mg/dL   Total Protein 6.6 6.5 - 8.1 g/dL   Albumin 3.4 (L) 3.5 - 5.0 g/dL   AST 55 (H) 15 - 41 U/L   ALT 47 14 - 54 U/L   Alkaline Phosphatase 73 38 - 126 U/L   Total Bilirubin 1.9 (H) 0.3 - 1.2 mg/dL   GFR calc non Af Amer >60 >60 mL/min   GFR calc Af Amer >60 >60 mL/min   Anion gap 9 5 - 15  Magnesium     Status: Abnormal   Collection Time: 01/08/16  5:19 AM  Result Value Ref Range   Magnesium 2.6 (H) 1.7 - 2.4 mg/dL  Glucose, capillary     Status: Abnormal   Collection Time: 01/08/16  8:13 AM  Result Value Ref Range   Glucose-Capillary 154 (H) 65 - 99 mg/dL  Glucose, capillary     Status: Abnormal   Collection Time: 01/08/16 11:01 AM  Result Value Ref Range    Glucose-Capillary 171 (H) 65 - 99 mg/dL  Glucose, capillary     Status: Abnormal   Collection Time: 01/08/16  1:55 PM  Result Value Ref Range   Glucose-Capillary 136 (H) 65 - 99 mg/dL   Dg Abd Portable 1v  01/07/2016  CLINICAL DATA:  Feeding tube placement. EXAM: PORTABLE ABDOMEN - 1 VIEW COMPARISON:  None. FINDINGS: Supine portable abdomen at 1626 hours shows a small bore feeding catheter with the tip at or just distal to the pylorus. There is no NG tube visible on this study. IVC filter is evident in situ. Contrast material is visualized within small bowel loops. IMPRESSION: 1. Feeding tube tip is at or just distal to the pylorus. 2. No NG tube visible on this study. Electronically Signed   By: Misty Stanley M.D.   On: 01/07/2016 16:34   Dg Swallowing Func-speech Pathology  01/07/2016  Objective Swallowing Evaluation: Type of Study: MBS-Modified Barium Swallow Study Patient Details Name: SUMI AX MRN: [id number] Date of Birth: Jul 25, 1940 Today's Date: 01/07/2016 Time: SLP Start Time (ACUTE ONLY): 1002-SLP Stop Time (ACUTE ONLY): 1017 SLP Time Calculation (min) (ACUTE ONLY): 15 min Past Medical History: Past Medical History Diagnosis Date . Medical history non-contributory  . Acute pulmonary embolism ([place]) 12/28/2015 Past Surgical History: Past Surgical History Procedure Laterality Date . Back surgery   . 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 female without significant PMH, admitted 6/15 after sustaining a fall with L femoral neck fracture. Pt was intubated 6/16 for surgery, extubated 6/17. On 6/18 pt with sudden drop in BP/ back pain while on TPA, then PEA arrest and required resuscitation and was re-intubated, extubated 6/23. Noted to have strong cough and able to speak name. Most recent CXR showed persistent atelectasis versus  consolidation LLL. Bedside swallow eval ordered due to prolonged/ multiple intubations.  Subjective: pt alert, cooperative but not very communicative Assessment / Plan / Recommendation CHL IP CLINICAL IMPRESSIONS 01/07/2016 Therapy Diagnosis Moderate oral  phase dysphagia;Moderate pharyngeal phase dysphagia Clinical Impression Pt has a moderate oropharyngeal dysphagia due to generalized weakness that impacts her strength and cohesion with oral and pharyngeal transit. A delay in swallow trigger results in deep penetration before the swallow spoonfuls of nectar thick liquids and small cup sips of honey thick liquids. Mod cues provided for cough to clear, and pt did not follow commands to attempt chin tuck posture. All consistencies tested leave moderate residue at the base of tongue and valleculae that elicit spontaneous additional swallows. Despite multiple swallows per bolus, residue is reduced but not cleared from her valleculae. As trials continue, she begins to fatigue and no longer initiates volitional swallows in an attempt to further clear her pharynx. While aspiration was not observed, her risk remains high due to weakness and fatigue. Recommend to maintain NPO status except for meds crushed in puree. Will initiate therapeutic trials of puree and honey thick liquids by spoon with SLP only. Impact on safety and function Moderate aspiration risk;Severe aspiration risk;Risk for inadequate nutrition/hydration   CHL IP TREATMENT RECOMMENDATION 01/07/2016 Treatment Recommendations Therapy as outlined in treatment plan below   Prognosis 01/07/2016 Prognosis for Safe Diet Advancement Good Barriers to Reach Goals -- Barriers/Prognosis Comment -- CHL IP DIET RECOMMENDATION 01/07/2016 SLP Diet Recommendations NPO except meds Liquid Administration via -- Medication Administration Crushed with puree Compensations -- Postural Changes --   CHL IP OTHER RECOMMENDATIONS 01/07/2016 Recommended Consults -- Oral Care Recommendations  Oral care QID Other Recommendations Have oral suction available   CHL IP FOLLOW UP RECOMMENDATIONS 01/07/2016 Follow up Recommendations Inpatient Rehab   CHL IP FREQUENCY AND DURATION 01/07/2016 Speech Therapy Frequency (ACUTE ONLY) min 2x/week Treatment Duration 2 weeks      CHL IP ORAL PHASE 01/07/2016 Oral Phase Impaired Oral - Pudding Teaspoon -- Oral - Pudding Cup -- Oral - Honey Teaspoon Weak lingual manipulation;Reduced posterior propulsion;Lingual/palatal residue;Delayed oral transit Oral - Honey Cup Weak lingual manipulation;Reduced posterior propulsion;Lingual/palatal residue;Delayed oral transit Oral - Nectar Teaspoon Weak lingual manipulation;Reduced posterior propulsion;Lingual/palatal residue;Delayed oral transit Oral - Nectar Cup -- Oral - Nectar Straw -- Oral - Thin Teaspoon -- Oral - Thin Cup -- Oral - Thin Straw -- Oral - Puree Weak lingual manipulation;Reduced posterior propulsion;Lingual/palatal residue;Delayed oral transit Oral - Mech Soft -- Oral - Regular -- Oral - Multi-Consistency -- Oral - Pill -- Oral Phase - Comment --  CHL IP PHARYNGEAL PHASE 01/07/2016 Pharyngeal Phase Impaired Pharyngeal- Pudding Teaspoon -- Pharyngeal -- Pharyngeal- Pudding Cup -- Pharyngeal -- Pharyngeal- Honey Teaspoon Delayed swallow initiation-pyriform sinuses;Reduced anterior laryngeal mobility;Reduced laryngeal elevation;Reduced tongue base retraction;Pharyngeal residue - valleculae Pharyngeal -- Pharyngeal- Honey Cup Delayed swallow initiation-pyriform sinuses;Reduced anterior laryngeal mobility;Reduced laryngeal elevation;Reduced tongue base retraction;Pharyngeal residue - valleculae;Penetration/Aspiration before swallow Pharyngeal Material enters airway, remains ABOVE vocal cords and not ejected out Pharyngeal- Nectar Teaspoon Delayed swallow initiation-pyriform sinuses;Reduced anterior laryngeal mobility;Reduced laryngeal elevation;Reduced tongue base retraction;Pharyngeal residue -  valleculae;Penetration/Aspiration before swallow Pharyngeal Material enters airway, CONTACTS cords and then ejected out Pharyngeal- Nectar Cup -- Pharyngeal -- Pharyngeal- Nectar Straw -- Pharyngeal -- Pharyngeal- Thin Teaspoon -- Pharyngeal -- Pharyngeal- Thin Cup -- Pharyngeal -- Pharyngeal- Thin Straw -- Pharyngeal -- Pharyngeal- Puree Delayed swallow initiation-pyriform sinuses;Reduced anterior laryngeal mobility;Reduced laryngeal elevation;Reduced tongue base retraction;Pharyngeal residue - valleculae Pharyngeal -- Pharyngeal- Mechanical Soft -- Pharyngeal -- Pharyngeal- Regular -- Pharyngeal -- Pharyngeal- Multi-consistency -- Pharyngeal -- Pharyngeal- Pill -- Pharyngeal -- Pharyngeal Comment --  CHL IP CERVICAL ESOPHAGEAL PHASE 01/07/2016 Cervical Esophageal Phase WFL Pudding Teaspoon -- Pudding  Cup -- Honey Teaspoon -- Honey Cup -- Nectar Teaspoon -- Nectar Cup -- Nectar Straw -- Thin Teaspoon -- Thin Cup -- Thin Straw -- Puree -- Mechanical Soft -- Regular -- Multi-consistency -- Pill -- Cervical Esophageal Comment -- No flowsheet data found. Germain Osgood, M.A. CCC-SLP [phone] Germain Osgood 01/07/2016, 10:43 AM               Assessment/Plan: Diagnosis: Functional, mobility, and swallowing/communication deficits secondary to left femoral neck fracture and subsequent complications 1. Does the need for close, 24 hr/day medical supervision in concert with the patient's rehab needs make it unreasonable for this patient to be served in a less intensive setting? Yes 2. Co-Morbidities requiring supervision/potential complications: PE/afib/cardiac arrest, massive blood loss 3. Due to bladder management, bowel management, safety, skin/wound care, disease management, medication administration, pain management and patient education, does the patient require 24 hr/day rehab nursing? Yes 4. Does the patient require coordinated care of a physician, rehab nurse, PT (1-2 hrs/day, 5 days/week), OT (1-2  hrs/day, 5 days/week) and SLP (1-2 hrs/day, 5 days/week) to address physical and functional deficits in the context of the above medical diagnosis(es)? Yes Addressing deficits in the following areas: balance, endurance, locomotion, strength, transferring, bowel/bladder control, bathing, dressing, feeding, grooming, toileting, speech, swallowing and psychosocial support 5. Can the patient actively participate in an intensive therapy program of at least 3 hrs of therapy per day at least 5 days per week? Yes 6. The potential for patient to make measurable gains while on inpatient rehab is excellent 7. Anticipated functional outcomes upon discharge from inpatient rehab are supervision and min assist  with PT, supervision and min assist with OT, modified independent and supervision with SLP. 8. Estimated rehab length of stay to reach the above functional goals is: 18-24 days 9. Does the patient have adequate social supports and living environment to accommodate these discharge functional goals? Yes 10. Anticipated D/C setting: Home 11. Anticipated post D/C treatments: [place] therapy 12. Overall Rehab/Functional Prognosis: excellent  RECOMMENDATIONS: This patient's condition is appropriate for continued rehabilitative care in the following setting: CIR Patient has agreed to participate in recommended program. Yes Note that insurance prior authorization may be required for reimbursement for recommended care.  Comment: Rehab Admissions Coordinator to follow up.  Thanks,  Meredith Staggers, MD, Mellody Drown     01/08/2016

## 2016-01-08 NOTE — Progress Notes (Signed)
Speech Language Pathology Treatment: Dysphagia  Patient Details Name: Bonnie Salazar MRN: [id number] DOB: 08/23/1940 Today's Date: 01/08/2016 Time: [id number] SLP Time Calculation (min) (ACUTE ONLY): 14 min  Assessment / Plan / Recommendation Clinical Impression  SLP reviewed results of MBS with pt and husband before providing therapeutic trials of puree. Min cues for additional swallows (2-3) per bolus initially, with increased cueing provided as pt quickly fatigued. As time in between dry swallows increased, pt began to exhibit weak, delayed coughing. SLP provided education to pt/spouse about use of effortful swallows and high pitched /i/ in between therapy sessions. Will continue to follow for additional strengthening exercises.   HPI HPI: Pt is a 75 y.o. female without significant PMH, admitted 6/15 after sustaining a fall with L femoral neck fracture. Pt was intubated 6/16 for surgery, extubated 6/17. On 6/18 pt with sudden drop in BP/ back pain while on TPA, then PEA arrest and required resuscitation and was re-intubated, extubated 6/23. Noted to have strong cough and able to speak name. Most recent CXR showed persistent atelectasis versus consolidation LLL. Bedside swallow eval ordered due to prolonged/ multiple intubations.       SLP Plan  Continue with current plan of care     Recommendations  Diet recommendations: NPO Medication Administration: Crushed with puree             Oral Care Recommendations: Oral care QID Follow up Recommendations: Inpatient Rehab Plan: Continue with current plan of care     GO               Bonnie Salazar, M.A. CCC-SLP [phone]  Bonnie Salazar 01/08/2016, 10:29 AM

## 2016-01-09 DIAGNOSIS — R58 Hemorrhage, not elsewhere classified: Secondary | ICD-10-CM | POA: Diagnosis present

## 2016-01-09 LAB — GLUCOSE, CAPILLARY
GLUCOSE-CAPILLARY: 123 mg/dL — AB (ref 65–99)
GLUCOSE-CAPILLARY: 148 mg/dL — AB (ref 65–99)
GLUCOSE-CAPILLARY: 154 mg/dL — AB (ref 65–99)
GLUCOSE-CAPILLARY: 162 mg/dL — AB (ref 65–99)
Glucose-Capillary: 139 mg/dL — ABNORMAL HIGH (ref 65–99)
Glucose-Capillary: 136 mg/dL — ABNORMAL HIGH (ref 65–99)
Glucose-Capillary: 146 mg/dL — ABNORMAL HIGH (ref 65–99)
Glucose-Capillary: 142 mg/dL — ABNORMAL HIGH (ref 65–99)

## 2016-01-09 LAB — BASIC METABOLIC PANEL
Anion gap: 7 (ref 5–15)
BUN: 50 mg/dL — AB (ref 6–20)
CALCIUM: 9.1 mg/dL (ref 8.9–10.3)
CO2: 25 mmol/L (ref 22–32)
CREATININE: 0.7 mg/dL (ref 0.44–1.00)
Chloride: 125 mmol/L — ABNORMAL HIGH (ref 101–111)
GFR calc Af Amer: >60 mL/min (ref >60)
GLUCOSE: 166 mg/dL — AB (ref 65–99)
Potassium: 3.7 mmol/L (ref 3.5–5.1)
Sodium: 157 mmol/L — ABNORMAL HIGH (ref 135–145)

## 2016-01-09 LAB — CBC WITH DIFFERENTIAL/PLATELET
BASOS PCT: 0 %
Basophils Absolute: 0 10*3/uL (ref 0.0–0.1)
EOS ABS: 0.2 10*3/uL (ref 0.0–0.7)
EOS PCT: 1 %
HCT: 35.2 % — ABNORMAL LOW (ref 36.0–46.0)
HEMOGLOBIN: 10.3 g/dL — AB (ref 12.0–15.0)
LYMPHS ABS: 1.6 10*3/uL (ref 0.7–4.0)
Lymphocytes Relative: 10 %
MCH: 30.1 pg (ref 26.0–34.0)
MCHC: 29.3 g/dL — ABNORMAL LOW (ref 30.0–36.0)
MCV: 102.9 fL — ABNORMAL HIGH (ref 78.0–100.0)
MONO ABS: 0.9 10*3/uL (ref 0.1–1.0)
MONOS PCT: 6 %
NEUTROS PCT: 83 %
Neutro Abs: 12.8 10*3/uL — ABNORMAL HIGH (ref 1.7–7.7)
PLATELETS: 301 10*3/uL (ref 150–400)
RBC: 3.42 MIL/uL — ABNORMAL LOW (ref 3.87–5.11)
RDW: 20.3 % — AB (ref 11.5–15.5)
WBC: 15.6 10*3/uL — ABNORMAL HIGH (ref 4.0–10.5)

## 2016-01-09 LAB — MAGNESIUM: Magnesium: 2.5 mg/dL — ABNORMAL HIGH (ref 1.7–2.4)

## 2016-01-09 MED ORDER — DILTIAZEM HCL 30 MG PO TABS
30.0000 mg | ORAL_TABLET | Freq: Four times a day (QID) | ORAL | Status: DC
  Administered 2016-01-09 – 2016-01-16 (×24): 30 mg via ORAL
  Filled 2016-01-09 (×28): qty 1

## 2016-01-09 NOTE — Progress Notes (Signed)
PROGRESS NOTE    Bonnie Salazar  PFX:[id number] DOB: 03-02-1941 DOA: 12/25/2015 PCP: [name]   Brief Narrative:  75 y.o. WF without significant past medical history  She sustained an accidental fall at home. She tripped on a open drawer, causing her fall. She subsequently developed left hip pain, 10/10 in intensity with any movement. No LOC. No chest pain or shortness of breath. No swelling. No palpitations. She was taken to the OR for operative repair 6/16. The initial repair appeared uneventful and she was extubated in the PACU. Shortly thereafter, she complained of abdominal pain and was noted to have a tense abdomen. CT scan showed extravasation into the LLQ; she was taken to IR and underwent covered stent of the distal external iliac artery to the inguinal ligament. Vascular surgery was also consulted. No signs of distal ischemia on his exam. There is some concern for abdominal compartment syndrome, as well. MTP was initiated and she received 11u pRBC, 7 FFP, 2 PLT pheresis, 2 cryoprecipitate. Returned to ICU on mechanical ventilation. She suffered a PEA arrest 6/18. ICU course complicated by further bleeding. IVC filter placed.    Assessment & Plan:   Principal Problem:   Fracture of femoral neck, left (HCC) Active Problems:   Hip fracture (HCC)   Leukocytosis   Dehydration   Atrial thrombus (HCC)   Acute pulmonary embolism (HCC)   Acute respiratory failure (HCC)   Hemorrhagic shock   Cardiac arrest (HCC)   Acute pulmonary embolus (HCC)   Atrial mass   Bleeding   Pulmonary embolus, left (HCC)   Pulmonary embolus, right (HCC)   Acute respiratory failure with hypoxia (HCC)   PEA (Pulseless electrical activity) (HCC)   Right atrial thrombus (HCC)   Arterial hemorrhage   Hypovolemic shock (HCC)   Hypokalemia   Hypernatremia   Gastrointestinal hemorrhage with melena   Blood per rectum   Hemorrhage   Left femoral neck fracture after accidental  fall -Resolved. S/P repair by orthopedic -Out of bed to chair q shift  Acute Hypoxic Respiratory Failure  -Secondary to bilateral PE : Resolved -Titrate O2 to maintain SPO2> 92%  Positive Acute bilateral PE-->RV strain  -See acute respiratory failure  PEA Arrest on 6/18(~10 minutes)/Troponin elevation -Occurred Post thrombolysis   -Most likely secondary demand ischemia   Paroxysmal atrial fibrillation/severe MR -All anticoagulants on hold secondary to patient's multiple sites of bleeding. -Review chart appears heparin drip stopped on 6/21 -HR well controlled -Cardizem 30 mg QID -Increase metoprolol IV to 10 mg QID  RA thrombus - likely propagated from right pelvic vein compression from hematoma-->migrated and now with BILATERAL PE -S/P, Thrombolysis   Heart murmur  Arterial hemorrhage s/p stenting and requiring massive transfusion  -See significant events -6/16 transfuse to 14 units PRBC, 8 units FFP, 2 units cryoprecipitate, 2 units platelets -6/18 transfused 2 units PRBC, 8 units FFP -6/21 transfused 2 units PRBC  Recent Labs Lab 01/06/16 0506 01/06/16 1430 01/07/16 0540 01/08/16 0519 01/09/16 0440  HGB 11.3* 10.5* 10.2* 10.8* 10.3*  -Stable  GI bleed  -Review chart; appears patient last received heparin on 6/21 -6/27 Overnight patient with significant melena/diarrhea -H/H Daily-Protonix 40 mg per tube BID -GI does not believe Ischemic colitis, Recommends watchful waiting. Appears bleeding has stopped  -See atrial hemorrhage  Hypovolemic Shock - resolved 6/21 -IVC filter in place  Hypokalemia -Potassium goal>4  Hypernatremia -Free water 332m  q 4hr    Recent Labs Lab 01/06/16 0506 01/07/16 0540 01/08/16 0519 01/08/16 1902  01/09/16 0440  NA 155* 155* 159* 159* 157*    Protein Calorie Malnutrition -TF  Anxiety and post-op pain      DVT prophylaxis: SCD Code Status: Full Family Communication: Spoke with husband at length Disposition  Plan: SNF   Consultants:  Dr. Carole Binning Orthopedics surgery Dr.Matthew [place] surgery D.O. Corrie Mckusick  IR [facility]    Procedures/Significant Events:  6/16 TOTAL HIP ARTHROPLASTY;Lt ext iliac artery bleed/pelvic hematoma 6/16 CT pelvis with contrast;Active bleeding in the pelvis. Originating fm Proximal left common femoral artery/distal left external iliac artery. Bleeding near origin of the left inferior epigastric artery.-Large amount of blood and hematoma formation throughout the abdomen and pelvis. 6/16 left lower extremity angiogram; emergent covered stent embolization of distal external iliac artery branch vessel pseudoaneurysm/avulsion, with placement of stent graft 63m x 5cm viabahn  ECHO 6/17 >> LVEF= 50-55%; - Mitral valve: Moderate prolapse w/ severe regurgitation. - Left atrium: mildly dilated.- Right ventricle: mildly dilated. 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 CT chest 6/17 >> Bilateral pulmonary emboli, nearly occlusive in the left lower lobe segmental branches, involving all of the segmental pulmonary arteries. Evidence of right heart strain with right/left heart ratio> 1. -Heterogeneous area of hypoattenuation within the right atrium may represent right atrial clot versus mixing of non-opacified blood from the inferior vena cava. Bilateral pleural effusions and atelectatic changes of bilateral lung bases. LE [place]6/17 >> negative for DVT ECHO 6/18 >> LVEF 55-60%, normal wall motion, moderate MVP, mod to severe MR, RA mildly dilated, RA with large moblie pedunculated and irregular mass that prolapses through the TV c/w thrombus CT ABD/Pelvis 6/18 >> no evidence of new intraperitoneal or pelvic hemorrhage, increasing hematoma in left proximal thigh, decreasing volume of pulmonary embolus in the bilateral lower lobes 6/18 TPA for right atrial thrombus 6/18; PEA arrest CT Head  6/18 >> No CT evidence of acute intracranial abnormality ECHO 6/19 >> LVEF 50-55%, normal wall motion, no RWMA, previous RA mass no longer demonstrated on this ECHO, mild increase in PA systolic pressure 68/52infrarenal IVC filter placement      Cultures 6/20 blood left hand 2 negative   6/21 tracheal aspirate negative   Antimicrobials: Perioperative Ancef 6/20 Zosyn >>> 6/26 6/21 Vancomycin >> 6/23   Devices    LINES / TUBES:  Foley 6/16  PIV ETT 6/16-->6/17 >> 6/18 >> 6/23 L Mier DLC 6/16 >>    Continuous Infusions: . feeding supplement (VITAL AF 1.2 CAL) 1,000 mL (01/09/16 1401)     Subjective: 6/30 afebrile overnight, Patient will not verbalize but will nod yes/no to questions. Follows commands. Extremely weak.    Objective: Filed Vitals:   01/09/16 0753 01/09/16 1200 01/09/16 1720 01/09/16 2000  BP: 125/69 113/70 104/69 109/57  Pulse: 94 107 100 79  Temp: 97.6 F (36.4 C) 97.6 F (36.4 C) 97.5 F (36.4 C) 99.9 F (37.7 C)  TempSrc: Oral Oral Oral Oral  Resp: '23 26 20 22  '$ Height:      Weight:      SpO2: 99% 98% 98% 98%    Intake/Output Summary (Last 24 hours) at 01/09/16 2030 Last data filed at 01/09/16 1420  Gross per 24 hour  Intake    800 ml  Output    750 ml  Net     50 ml   Filed Weights   01/07/16 0421 01/08/16 0413 01/09/16 0427  Weight: 59.6  kg (131 lb 6.3 oz) 60.5 kg (133 lb 6.1 oz) 60.4 kg (133 lb 2.5 oz)    Examination:  General: Alert, will not Verbalize answers to questions but nods yes/no, follows commands, No acute respiratory distress Eyes: negative scleral hemorrhage, negative anisocoria, negative icterus ENT: Negative Runny nose, negative gingival bleeding, Neck:  Negative scars, masses, torticollis, lymphadenopathy, JVD, left subclavian CVL covering clean. Lungs: Clear to auscultation bilaterally without wheezes or crackles Cardiovascular: Regular rate and rhythm without murmur gallop or rub normal S1 and S2 Abdomen:   Positive abdominal pain RLQ/LLQ ,  positive distention but soft, positive soft, bowel sounds, no rebound, no ascites, no appreciable mass Extremities: No significant cyanosis, clubbing, or edema bilateral lower extremities Skin: Negative rashes, lesions, ulcers GI; small external hemorrhoid 7:00 position mild bleeding. BRBPR squirting onto sheets. Central nervous system:  Cranial nerves II through XII intact, tongue/uvula midline, all extremities muscle strength 5/5, sensation intact throughout, unable to fully evaluate .     Data Reviewed: Care during the described time interval was provided by me .  I have reviewed this patient's available data, including medical history, events of note, physical examination, and all test results as part of my evaluation. I have personally reviewed and interpreted all radiology studies.  CBC:  Recent Labs Lab 01/05/16 0823 01/06/16 0506 01/06/16 1430 01/07/16 0540 01/08/16 0519 01/09/16 0440  WBC 20.1* 20.4*  --  16.5* 16.1* 15.6*  NEUTROABS  --  18.0*  --   --   --  12.8*  HGB 11.0* 11.3* 10.5* 10.2* 10.8* 10.3*  HCT 36.1 36.0 35.2* 33.7* 36.3 35.2*  MCV 95.8 99.4  --  98.0 101.1* 102.9*  PLT 257 260  --  274 316 962   Basic Metabolic Panel:  Recent Labs Lab 01/03/16 0449  01/04/16 1030  01/04/16 1506 01/05/16 0335 01/05/16 1500 01/06/16 0506 01/07/16 0540 01/08/16 0519 01/08/16 1902 01/09/16 0440  NA 137  < >  --   < >  --  146* 151* 155* 155* 159* 159* 157*  K 2.5*  < >  --   < >  --  2.0* 3.0* 3.2* 3.1* 3.0* 3.5 3.7  CL 83*  < >  --   < >  --  105 112* 119* 118* 122* 128* 125*  CO2 35*  < >  --   < >  --  '30 28 27 26 28 27 25  '$ GLUCOSE 149*  < >  --   < >  --  168* 123* 194* 113* 160* 161* 166*  BUN 43*  < >  --   < >  --  84* 90* 82* 63* 58* 49* 50*  CREATININE 1.18*  < >  --   < >  --  1.07* 1.09* 1.00 0.79 0.86 0.71 0.70  CALCIUM 10.8*  < >  --   < >  --  10.2 10.4* 10.0 9.5 9.6 9.2 9.1  MG 2.3  --  1.3*  --  2.9* 2.9* 3.0*   --  2.6* 2.6* 2.6* 2.5*  PHOS 2.8  --  1.5*  --  3.1 3.0 2.1*  --   --   --   --   --   < > = values in this interval not displayed. GFR: Estimated Creatinine Clearance: 58.8 mL/min (by C-G formula based on Cr of 0.7). Liver Function Tests:  Recent Labs Lab 01/06/16 0506 01/08/16 0519  AST 48* 55*  ALT 33 47  ALKPHOS 78 73  BILITOT 2.0* 1.9*  PROT 7.3 6.6  ALBUMIN 4.2 3.4*   No results for input(s): LIPASE, AMYLASE in the last 168 hours. No results for input(s): AMMONIA in the last 168 hours. Coagulation Profile:  Recent Labs Lab 01/06/16 1430  INR 1.29   Cardiac Enzymes:  Recent Labs Lab 01/03/16 0449  TROPONINI 0.23*   BNP (last 3 results) No results for input(s): PROBNP in the last 8760 hours. HbA1C: No results for input(s): HGBA1C in the last 72 hours. CBG:  Recent Labs Lab 01/08/16 2357 01/09/16 0400 01/09/16 0751 01/09/16 1210 01/09/16 1626  GLUCAP 162* 139* 136* 146* 148*   Lipid Profile: No results for input(s): CHOL, HDL, LDLCALC, TRIG, CHOLHDL, LDLDIRECT in the last 72 hours. Thyroid Function Tests: No results for input(s): TSH, T4TOTAL, FREET4, T3FREE, THYROIDAB in the last 72 hours. Anemia Panel: No results for input(s): VITAMINB12, FOLATE, FERRITIN, TIBC, IRON, RETICCTPCT in the last 72 hours. Urine analysis:    Component Value Date/Time   COLORURINE YELLOW 01/06/2016 0610   APPEARANCEUR CLOUDY* 01/06/2016 0610   LABSPEC 1.026 01/06/2016 0610   PHURINE 6.0 01/06/2016 0610   GLUCOSEU NEGATIVE 01/06/2016 0610   HGBUR LARGE* 01/06/2016 0610   BILIRUBINUR NEGATIVE 01/06/2016 0610   KETONESUR NEGATIVE 01/06/2016 0610   PROTEINUR 100* 01/06/2016 0610   NITRITE NEGATIVE 01/06/2016 0610   LEUKOCYTESUR NEGATIVE 01/06/2016 0610   Sepsis Labs: '@LABRCNTIP'$ (procalcitonin:4,lacticidven:4)  ) Recent Results (from the past 240 hour(s))  Culture, respiratory (NON-Expectorated)     Status: None   Collection Time: 12/31/15 12:10 PM  Result Value  Ref Range Status   Specimen Description TRACHEAL ASPIRATE  Final   Special Requests Normal  Final   Gram Stain   Final    ABUNDANT WBC PRESENT,BOTH PMN AND MONONUCLEAR NO ORGANISMS SEEN    Culture NO GROWTH 2 DAYS  Final   Report Status 01/02/2016 FINAL  Final         Radiology Studies: No results found.      Scheduled Meds: . antiseptic oral rinse  7 mL Mouth Rinse QID  . chlorhexidine gluconate (SAGE KIT)  15 mL Mouth Rinse BID  . diltiazem  30 mg Oral Q6H  . free water  300 mL Per Tube Q4H  . insulin aspart  0-15 Units Subcutaneous Q4H  . metoprolol  10 mg Intravenous Q6H  . pantoprazole sodium  40 mg Per Tube Daily   Continuous Infusions: . feeding supplement (VITAL AF 1.2 CAL) 1,000 mL (01/09/16 1401)     LOS: 15 days    Time spent: 40 minutes    Angelena Sand, Geraldo Docker, MD [facility] Pager [phone]   If 7PM-7AM, please contact night-coverage [email or web address] Password [email or web address] 01/09/2016, 8:30 PM

## 2016-01-09 NOTE — Progress Notes (Signed)
01/09/2016 foley was removed at 1420 patient had 150cc of amber cloudy urine no odor noted. Patient tolerate well. [facility] RN.

## 2016-01-09 NOTE — Care Management Important Message (Signed)
Important Message  Patient Details  Name: Bonnie Salazar MRN: [id number] Date of Birth: 1940-07-25   Medicare Important Message Given:  Yes    Nathen May 01/09/2016, 10:48 AM

## 2016-01-09 NOTE — Progress Notes (Signed)
Physical Therapy Treatment Patient Details Name: Bonnie Salazar MRN: [id number] DOB: 02/15/1941 Today's Date: 01/09/2016    History of Present Illness 75 y.o. female admitted to [facility] on 12/25/15 s/p fall with resultant L hip fx s/p L direct anterior THA, WBAT. Pt was extubated in the PACU. Shortly thereafter, she complained of abdominal pain and was noted to have a tense abdomen. CT scan showed extravasation into the LLQ; she was taken to IR and underwent covered stent of the distal external iliac artery to the inguinal ligament. Vascular surgery was also consulted. No signs of distal ischemia on his exam. There is some concern for abdominal compartment syndrome, as well. MTP was initiated and she received 11u pRBC, 7FFP, 2 PLT pheresis, 2 cryoprecipitate. Returned to ICU on mechanical ventilation. She suffered a PEA arrest 6/18. ICU course complicated by further bleeding. IVC filter placed. Intubated 6/16-6/17/17 and 6/18-6/23/17. Pt with significant PMHx of back surgery listed in chart. bil PE found on CT 6/17.     PT Comments    Bonnie Salazar appeared more fatigued today but remains motivated and was agreeable to work w/ therapy. She required mod assist for bed mobility, min +2 assist for transfers, and mod assist for safe short distance (10 ft) ambulation in room. Discussed w/ pt and husband the benefits of CIR for additional therapy to progress w/ mobility and both agreeable to CIR once appropriate.   Follow Up Recommendations  CIR     Equipment Recommendations  Rolling walker with 5" wheels;3in1 (PT)    Recommendations for Other Services       Precautions / Restrictions Precautions Precautions: Fall Restrictions Weight Bearing Restrictions: Yes LLE Weight Bearing: Weight bearing as tolerated    Mobility  Bed Mobility Overal bed mobility: Needs Assistance Bed Mobility: Supine to Sit     Supine to sit: Mod assist     General bed mobility comments: Able to initiate  movement of Bil LEs but requires assist to get them to EOB, assist to elevate trunk and to scoot to EOB.   Transfers Overall transfer level: Needs assistance Equipment used: Rolling walker (2 wheeled) Transfers: Sit to/from [facility] Sit to Stand: +2 physical assistance;Min assist Stand pivot transfers: +2 safety/equipment;Min assist       General transfer comment: Assist to boost and steady w/ cues for hand placement and technique.  Pt able to correct posterior bias and flexed posture this session w/ verbal and tactile cues.  Min assist to manage RW w/ pivot.  Ambulation/Gait Ambulation/Gait assistance: Mod assist;+2 safety/equipment Ambulation Distance (Feet): 10 Feet Assistive device: Rolling walker (2 wheeled) Gait Pattern/deviations: Decreased stride length;Step-to pattern;Antalgic;Trunk flexed;Narrow base of support   Gait velocity interpretation: <1.8 ft/sec, indicative of risk for recurrent falls General Gait Details: Pt shuffles feet and requires assist to steady, less posterior bias today.  Moves slowly but pt motivated, appears to take more effort to ambulate 10 ft today.  Close chair follow for safety as pt fatigues quickly.   Stairs            Wheelchair Mobility    Modified Rankin (Stroke Patients Only)       Balance Overall balance assessment: Needs assistance Sitting-balance support: Bilateral upper extremity supported;Feet supported Sitting balance-Leahy Scale: Fair Sitting balance - Comments: Pt able to sit EOB ~5 minutes w/ UE supported on bed w/ close min guard assist Postural control: Posterior lean Standing balance support: Bilateral upper extremity supported;During functional activity Standing balance-Leahy Scale: Poor Standing balance comment:  Relies on UE support from RW and additional physical assist                    Cognition Arousal/Alertness: Awake/alert Behavior During Therapy: Flat affect Overall Cognitive  Status: Impaired/Different from baseline Area of Impairment: Attention;Following commands;Problem solving   Current Attention Level: Sustained   Following Commands: Follows one step commands inconsistently;Follows one step commands with increased time     Problem Solving: Slow processing;Decreased initiation General Comments: Appears to require significant effort to perform minimal motor movement/follow commands     Exercises Total Joint Exercises Ankle Circles/Pumps: AROM;Both;Supine;15 reps Quad Sets: Strengthening;Both;5 reps;Supine    General Comments General comments (skin integrity, edema, etc.): HR up to 123 w/ activity      Pertinent Vitals/Pain Pain Assessment: Faces Faces Pain Scale: Hurts little more Pain Location: Lt hip w/ mobility Pain Descriptors / Indicators: Grimacing;Discomfort Pain Intervention(s): Limited activity within patient's tolerance;Monitored during session;Repositioned    Home Living                      Prior Function            PT Goals (current goals can now be found in the care plan section) Acute Rehab PT Goals Patient Stated Goal: none stated PT Goal Formulation: With patient/family Time For Goal Achievement: 01/18/16 Potential to Achieve Goals: Good Progress towards PT goals: Progressing toward goals    Frequency  Min 5X/week    PT Plan Current plan remains appropriate    Co-evaluation             End of Session Equipment Utilized During Treatment: Gait belt;Oxygen Activity Tolerance: Patient limited by fatigue Patient left: in chair;with call bell/phone within reach;with chair alarm set;with family/visitor present (w/ Speech Therapist)     [id number] PT Time Calculation (min) (ACUTE ONLY): 32 min  Charges:  $Gait Training: 8-22 mins $Therapeutic Activity: 8-22 mins                    G Codes:       Collie Siad PT, DPT  Pager: [phone] Phone: [phone] 01/09/2016, 12:18 PM

## 2016-01-09 NOTE — Progress Notes (Signed)
   Assessment: 14 Days Post-Op  S/P Procedure(s) (LRB): TOTAL HIP ARTHROPLASTY ANTERIOR APPROACH (Left) by Dr. Ernesta Amble. Percell Miller   Principal Problem:   Fracture of femoral neck, left (HCC) Active Problems:   Hip fracture (HCC)   Leukocytosis   Dehydration   Atrial thrombus (HCC)   Acute pulmonary embolism (HCC)   Acute respiratory failure (HCC)   Hemorrhagic shock   Cardiac arrest ([place])   Acute pulmonary embolus (HCC)   Atrial mass   Bleeding   Pulmonary embolus, left (HCC)   Pulmonary embolus, right (HCC)   Acute respiratory failure with hypoxia (HCC)   PEA (Pulseless electrical activity) (HCC)   Right atrial thrombus (HCC)   Arterial hemorrhage   Hypovolemic shock (HCC)   Hypokalemia   Hypernatremia   Gastrointestinal hemorrhage with melena   Blood per rectum  Left leg is stable w/ decreased edema, thigh and compartments soft. Extremities warm, +pulses. Responds quietly to questions. Wiggles toes. Distal sensation intact.   Plan: Up with therapy Weight Bearing: Weight Bearing as Tolerated (WBAT) left leg Dressings: prn VTE prophylaxis: per primary, ivc filter.  ASA recommended after discharge. Dispo: per primary.  Possible CIR  Subjective: Responds quietly / appropriately to yes/no questions. Pain controlled. Continues to feel better.   Objective:   VITALS:   Filed Vitals:   01/08/16 2000 01/08/16 2100 01/09/16 0427 01/09/16 0753  BP: 100/90 127/72 113/67 125/69  Pulse:  102 94 94  Temp:   97.5 F (36.4 C) 97.6 F (36.4 C)  TempSrc:   Oral Oral  Resp: 31 30 29 23   Height:      Weight:   60.4 kg (133 lb 2.5 oz)   SpO2:  99% 97% 99%   Physical Exam General: Alert. Upright in bed. Quiet yes/no answers & Nods/shakes head to questions.  Musculoskeletal: B/L extremities warm, + pulses.  Dressing: c/d/i  Prudencio Burly III 01/09/2016, 7:57 AM

## 2016-01-09 NOTE — Progress Notes (Signed)
Speech Language Pathology Treatment: Dysphagia  Patient Details Name: Bonnie Salazar MRN: [id number] DOB: 1941/05/17 Today's Date: 01/09/2016 Time: [id number] SLP Time Calculation (min) (ACUTE ONLY): 30 min  Assessment / Plan / Recommendation Clinical Impression  Pt sitting in recliner after PT.  Stimulable for improved phonation with mod cues for respiratory support.  Trials of honey-thick liquids completed with mod verbal/tactile cues for effortful swallow, head positioning.  Intermittent wet cough, throat-clearing noted post-swallow - limited boluses provided given concerns for aspiration.  Pt given instruction to complete Masako maneuver for base of tongue strengthening.  Max verbal/tactile cues for execution; fatigues easily.  Glottal closure exercises, high ee sustained up to seven seconds. Recommend continued NPO; pt to complete exercises independently through weekend.  SLP will follow.      HPI HPI: Pt is a 75 y.o. female without significant PMH, admitted 6/15 after sustaining a fall with L femoral neck fracture. Pt was intubated 6/16 for surgery, extubated 6/17. On 6/18 pt with sudden drop in BP/ back pain while on TPA, then PEA arrest and required resuscitation and was re-intubated, extubated 6/23. Noted to have strong cough and able to speak name. Most recent CXR showed persistent atelectasis versus consolidation LLL. Bedside swallow eval ordered due to prolonged/ multiple intubations. MBS 6/28 revealed moderate dysphagia with frank penetration of puree and honey-thick consistencies.  Recs for continued NPO; trial POs with SLP.       SLP Plan  Continue with current plan of care     Recommendations  Diet recommendations: NPO             Oral Care Recommendations: Oral care QID Follow up Recommendations: Inpatient Rehab Plan: Continue with current plan of care     GO               Chasmine Lender L. Tivis Ringer, [place] CCC/SLP Pager [phone]  Bonnie Salazar 01/09/2016, 12:52  PM

## 2016-01-10 DIAGNOSIS — N9489 Other specified conditions associated with female genital organs and menstrual cycle: Secondary | ICD-10-CM

## 2016-01-10 DIAGNOSIS — I5189 Other ill-defined heart diseases: Secondary | ICD-10-CM

## 2016-01-10 DIAGNOSIS — R58 Hemorrhage, not elsewhere classified: Secondary | ICD-10-CM

## 2016-01-10 DIAGNOSIS — J9601 Acute respiratory failure with hypoxia: Secondary | ICD-10-CM

## 2016-01-10 DIAGNOSIS — S72002A Fracture of unspecified part of neck of left femur, initial encounter for closed fracture: Secondary | ICD-10-CM

## 2016-01-10 DIAGNOSIS — I513 Intracardiac thrombosis, not elsewhere classified: Secondary | ICD-10-CM

## 2016-01-10 DIAGNOSIS — E870 Hyperosmolality and hypernatremia: Secondary | ICD-10-CM

## 2016-01-10 HISTORY — DX: Intracardiac thrombosis, not elsewhere classified: I51.3

## 2016-01-10 HISTORY — DX: Other specified conditions associated with female genital organs and menstrual cycle: N94.89

## 2016-01-10 HISTORY — DX: Other ill-defined heart diseases: I51.89

## 2016-01-10 HISTORY — DX: Hyperosmolality and hypernatremia: E87.0

## 2016-01-10 HISTORY — DX: Fracture of unspecified part of neck of left femur, initial encounter for closed fracture: S72.002A

## 2016-01-10 HISTORY — DX: Acute respiratory failure with hypoxia: J96.01

## 2016-01-10 HISTORY — DX: Hemorrhage, not elsewhere classified: R58

## 2016-01-10 LAB — BASIC METABOLIC PANEL
ANION GAP: 4 — AB (ref 5–15)
BUN: 44 mg/dL — AB (ref 6–20)
CHLORIDE: 124 mmol/L — AB (ref 101–111)
CO2: 25 mmol/L (ref 22–32)
Calcium: 8.9 mg/dL (ref 8.9–10.3)
Creatinine, Ser: 0.52 mg/dL (ref 0.44–1.00)
Glucose, Bld: 145 mg/dL — ABNORMAL HIGH (ref 65–99)
POTASSIUM: 3.1 mmol/L — AB (ref 3.5–5.1)
SODIUM: 153 mmol/L — AB (ref 135–145)

## 2016-01-10 LAB — CBC WITH DIFFERENTIAL/PLATELET
BASOS ABS: 0.1 10*3/uL (ref 0.0–0.1)
BASOS PCT: 0 %
Eosinophils Absolute: 0.3 10*3/uL (ref 0.0–0.7)
Eosinophils Relative: 2 %
HEMATOCRIT: 33.9 % — AB (ref 36.0–46.0)
Hemoglobin: 9.8 g/dL — ABNORMAL LOW (ref 12.0–15.0)
LYMPHS PCT: 11 %
Lymphs Abs: 1.4 10*3/uL (ref 0.7–4.0)
MCH: 29.8 pg (ref 26.0–34.0)
MCHC: 28.9 g/dL — ABNORMAL LOW (ref 30.0–36.0)
MCV: 103 fL — AB (ref 78.0–100.0)
Monocytes Absolute: 0.6 10*3/uL (ref 0.1–1.0)
Monocytes Relative: 5 %
NEUTROS ABS: 10.3 10*3/uL — AB (ref 1.7–7.7)
NEUTROS PCT: 82 %
Platelets: 289 10*3/uL (ref 150–400)
RBC: 3.29 MIL/uL — AB (ref 3.87–5.11)
RDW: 20.1 % — AB (ref 11.5–15.5)
WBC: 12.7 10*3/uL — AB (ref 4.0–10.5)

## 2016-01-10 LAB — GLUCOSE, CAPILLARY
GLUCOSE-CAPILLARY: 146 mg/dL — AB (ref 65–99)
GLUCOSE-CAPILLARY: 136 mg/dL — AB (ref 65–99)
Glucose-Capillary: 125 mg/dL — ABNORMAL HIGH (ref 65–99)
Glucose-Capillary: 145 mg/dL — ABNORMAL HIGH (ref 65–99)
Glucose-Capillary: 120 mg/dL — ABNORMAL HIGH (ref 65–99)
Glucose-Capillary: 141 mg/dL — ABNORMAL HIGH (ref 65–99)

## 2016-01-10 LAB — MAGNESIUM: MAGNESIUM: 2.3 mg/dL (ref 1.7–2.4)

## 2016-01-10 MED ORDER — POTASSIUM CHLORIDE 20 MEQ/15ML (10%) PO SOLN
40.0000 meq | Freq: Once | ORAL | Status: AC
  Administered 2016-01-10: 40 meq
  Filled 2016-01-10: qty 30

## 2016-01-10 MED ORDER — LOPERAMIDE HCL 1 MG/5ML PO LIQD
1.0000 mg | ORAL | Status: DC | PRN
  Administered 2016-01-10 – 2016-01-12 (×3): 1 mg
  Filled 2016-01-10 (×6): qty 5

## 2016-01-10 NOTE — Progress Notes (Addendum)
PROGRESS NOTE    Bonnie Salazar  BSJ:[id number] DOB: 05/11/1941 DOA: 12/25/2015 PCP: [name]   Brief Narrative:  75 y.o. WF without significant past medical history  She sustained an accidental fall at home. She tripped on a open drawer, causing her fall. She subsequently developed left hip pain, 10/10 in intensity with any movement. No LOC. No chest pain or shortness of breath. No swelling. No palpitations. She was taken to the OR for operative repair 6/16. The initial repair appeared uneventful and she was extubated in the PACU. Shortly thereafter, she complained of abdominal pain and was noted to have a tense abdomen. CT scan showed extravasation into the LLQ; she was taken to IR and underwent covered stent of the distal external iliac artery to the inguinal ligament. Vascular surgery was also consulted. No signs of distal ischemia on his exam. There is some concern for abdominal compartment syndrome, as well. MTP was initiated and she received 11u pRBC, 7 FFP, 2 PLT pheresis, 2 cryoprecipitate. Returned to ICU on mechanical ventilation. She suffered a PEA arrest 6/18. ICU course complicated by further bleeding. IVC filter placed.    Assessment & Plan:   Principal Problem:   Fracture of femoral neck, left (HCC) Active Problems:   Hip fracture (HCC)   Leukocytosis   Dehydration   Atrial thrombus (HCC)   Acute pulmonary embolism (HCC)   Acute respiratory failure (HCC)   Hemorrhagic shock   Cardiac arrest (HCC)   Acute pulmonary embolus (HCC)   Atrial mass   Bleeding   Pulmonary embolus, left (HCC)   Pulmonary embolus, right (HCC)   Acute respiratory failure with hypoxia (HCC)   PEA (Pulseless electrical activity) (HCC)   Right atrial thrombus (HCC)   Arterial hemorrhage   Hypovolemic shock (HCC)   Hypokalemia   Hypernatremia   Gastrointestinal hemorrhage with melena   Blood per rectum   Hemorrhage   Left femoral neck fracture after accidental  fall -Resolved. S/P repair by orthopedic -Out of bed to chair q shift  Acute Hypoxic Respiratory Failure  -Secondary to bilateral PE : Resolved -Titrate O2 to maintain SPO2> 92%  Positive Acute bilateral PE-->RV strain  -See acute respiratory failure  PEA Arrest on 6/18(~10 minutes)/Troponin elevation -Occurred Post thrombolysis   -Most likely secondary demand ischemia   Paroxysmal atrial fibrillation/severe MR -All anticoagulants on hold secondary to patient's multiple sites of bleeding. -Review chart appears heparin drip stopped on 6/21 -HR well controlled -Cardizem 30 mg QID -metoprolol IV to 10 mg QID  RA thrombus - likely propagated from right pelvic vein compression from hematoma-->migrated and now with BILATERAL PE -S/P, Thrombolysis   Heart murmur  Arterial hemorrhage s/p stenting and requiring massive transfusion  -See significant events -6/16 transfuse to 14 units PRBC, 8 units FFP, 2 units cryoprecipitate, 2 units platelets -6/18 transfused 2 units PRBC, 8 units FFP -6/21 transfused 2 units PRBC Recent Labs Lab 01/06/16 1430 01/07/16 0540 01/08/16 0519 01/09/16 0440 01/10/16 0358  HGB 10.5* 10.2* 10.8* 10.3* 9.8*  -Stable  GI bleed  -Review chart; appears patient last received heparin on 6/21 -6/27 Overnight patient with significant melena/diarrhea -H/H Daily-Protonix 40 mg per tube BID -GI does not believe Ischemic colitis, Recommends watchful waiting. Appears bleeding has stopped  -See atrial hemorrhage  Hypovolemic Shock - resolved 6/21 -IVC filter in place  Hypokalemia@@@ -Potassium goal>4 -Potassium solution 40 mEq  Hypernatremia -Free water 375m  q 4hr   Recent Labs Lab 01/07/16 0540 01/08/16 0519 01/08/16 1902 01/09/16  0440 01/10/16 0358  NA 155* 159* 159* 157* 153*    Protein Calorie Malnutrition -TF  Anxiety and post-op pain   Dysphagia -6/30 failed swallow study continue nothing by mouth -Repeat swallow study on  Monday 7/3. If patient fails swallow study can will have to consider placing PEG tube.  Diarrhea -Most likely secondary to tube feeds. Patient afebrile,    Goals of care -Awaiting rehabilitation to evaluate patient for CIR placement. -Discussed placement of PEG tube with patient yesterday, if she does not pass repeat swallow study on Monday 7/3 will need PEG placement prior to discharge to CIR   DVT prophylaxis: SCD Code Status: Full Family Communication: Spoke with husband at length Disposition Plan: CIR. Awaiting evaluation   Consultants:  Dr. Carole Binning Orthopedics surgery Dr.Matthew Vinings surgery D.O. Corrie Mckusick  [facility]    Procedures/Significant Events:  6/16 TOTAL HIP ARTHROPLASTY;Lt ext iliac artery bleed/pelvic hematoma 6/16 CT pelvis with contrast;Active bleeding in the pelvis. Originating fm Proximal left common femoral artery/distal left external iliac artery. Bleeding near origin of the left inferior epigastric artery.-Large amount of blood and hematoma formation throughout the abdomen and pelvis. 6/16 left lower extremity angiogram; emergent covered stent embolization of distal external iliac artery branch vessel pseudoaneurysm/avulsion, with placement of stent graft 29m x 5cm viabahn  ECHO 6/17 >> LVEF= 50-55%; - Mitral valve: Moderate prolapse w/ severe regurgitation. - Left atrium: mildly dilated.- Right ventricle: mildly dilated. 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 CT chest 6/17 >> Bilateral pulmonary emboli, nearly occlusive in the left lower lobe segmental branches, involving all of the segmental pulmonary arteries. Evidence of right heart strain with right/left heart ratio> 1. -Heterogeneous area of hypoattenuation within the right atrium may represent right atrial clot versus mixing of non-opacified blood from the inferior vena cava. Bilateral pleural  effusions and atelectatic changes of bilateral lung bases. LE [place]6/17 >> negative for DVT ECHO 6/18 >> LVEF 55-60%, normal wall motion, moderate MVP, mod to severe MR, RA mildly dilated, RA with large moblie pedunculated and irregular mass that prolapses through the TV c/w thrombus CT ABD/Pelvis 6/18 >> no evidence of new intraperitoneal or pelvic hemorrhage, increasing hematoma in left proximal thigh, decreasing volume of pulmonary embolus in the bilateral lower lobes 6/18 TPA for right atrial thrombus 6/18; PEA arrest CT Head 6/18 >> No CT evidence of acute intracranial abnormality ECHO 6/19 >> LVEF 50-55%, normal wall motion, no RWMA, previous RA mass no longer demonstrated on this ECHO, mild increase in PA systolic pressure 69/83infrarenal IVC filter placement      Cultures 6/20 blood left hand 2 negative   6/21 tracheal aspirate negative   Antimicrobials: Perioperative Ancef 6/20 Zosyn >>> 6/26 6/21 Vancomycin >> 6/23   Devices    LINES / TUBES:  Foley 6/16  PIV ETT 6/16-->6/17 >> 6/18 >> 6/23 L [place] DLC 6/16 >>    Continuous Infusions: . feeding supplement (VITAL AF 1.2 CAL) 1,000 mL (01/09/16 1401)     Subjective: 7/1 A/O 4, afebrile overnight, sitting in chair comfortably, answering the questions..    Objective: Filed Vitals:   01/10/16 0352 01/10/16 0400 01/10/16 0500 01/10/16 0600  BP: 119/59 114/61 126/62 97/80  Pulse: 89 87 85 80  Temp: 98.8 F (37.1 C)     TempSrc: Oral     Resp: '26 24 20 23  '$ Height:      Weight: 61.2  kg (134 lb 14.7 oz)     SpO2: 100% 99% 100% 99%    Intake/Output Summary (Last 24 hours) at 01/10/16 0851 Last data filed at 01/10/16 0543  Gross per 24 hour  Intake   2825 ml  Output    150 ml  Net   2675 ml   Filed Weights   01/08/16 0413 01/09/16 0427 01/10/16 0352  Weight: 60.5 kg (133 lb 6.1 oz) 60.4 kg (133 lb 2.5 oz) 61.2 kg (134 lb 14.7 oz)    Examination:  General: A/O 4, NAD, follows commands, No acute  respiratory distress Eyes: negative scleral hemorrhage, negative anisocoria, negative icterus ENT: Negative Runny nose, negative gingival bleeding, Neck:  Negative scars, masses, torticollis, lymphadenopathy, JVD, left subclavian CVL covering clean. Lungs: Clear to auscultation bilaterally without wheezes or crackles Cardiovascular: Regular rate and rhythm without murmur gallop or rub normal S1 and S2 Abdomen:  Positive abdominal pain RLQ/LLQ ,  positive distention but soft, positive soft, bowel sounds, no rebound, no ascites, no appreciable mass Extremities: No significant cyanosis, clubbing, or edema bilateral lower extremities Skin: Negative rashes, lesions, ulcers GI; right red blood per rectum appears to have stopped. Continued watery diarrhea (most likely secondary to tube feeds) Central nervous system:  Cranial nerves II through XII intact, tongue/uvula midline, all extremities muscle strength 5/5, sensation intact throughout, unable to fully evaluate .     Data Reviewed: Care during the described time interval was provided by me .  I have reviewed this patient's available data, including medical history, events of note, physical examination, and all test results as part of my evaluation. I have personally reviewed and interpreted all radiology studies.  CBC:  Recent Labs Lab 01/06/16 0506 01/06/16 1430 01/07/16 0540 01/08/16 0519 01/09/16 0440 01/10/16 0358  WBC 20.4*  --  16.5* 16.1* 15.6* 12.7*  NEUTROABS 18.0*  --   --   --  12.8* 10.3*  HGB 11.3* 10.5* 10.2* 10.8* 10.3* 9.8*  HCT 36.0 35.2* 33.7* 36.3 35.2* 33.9*  MCV 99.4  --  98.0 101.1* 102.9* 103.0*  PLT 260  --  274 316 301 638   Basic Metabolic Panel:  Recent Labs Lab 01/04/16 1030  01/04/16 1506 01/05/16 0335 01/05/16 1500  01/07/16 0540 01/08/16 0519 01/08/16 1902 01/09/16 0440 01/10/16 0358  NA  --   < >  --  146* 151*  < > 155* 159* 159* 157* 153*  K  --   < >  --  2.0* 3.0*  < > 3.1* 3.0* 3.5 3.7  3.1*  CL  --   < >  --  105 112*  < > 118* 122* 128* 125* 124*  CO2  --   < >  --  30 28  < > '26 28 27 25 25  '$ GLUCOSE  --   < >  --  168* 123*  < > 113* 160* 161* 166* 145*  BUN  --   < >  --  84* 90*  < > 63* 58* 49* 50* 44*  CREATININE  --   < >  --  1.07* 1.09*  < > 0.79 0.86 0.71 0.70 0.52  CALCIUM  --   < >  --  10.2 10.4*  < > 9.5 9.6 9.2 9.1 8.9  MG 1.3*  --  2.9* 2.9* 3.0*  --  2.6* 2.6* 2.6* 2.5* 2.3  PHOS 1.5*  --  3.1 3.0 2.1*  --   --   --   --   --   --   < > =  values in this interval not displayed. GFR: Estimated Creatinine Clearance: 59.6 mL/min (by C-G formula based on Cr of 0.52). Liver Function Tests:  Recent Labs Lab 01/06/16 0506 01/08/16 0519  AST 48* 55*  ALT 33 47  ALKPHOS 78 73  BILITOT 2.0* 1.9*  PROT 7.3 6.6  ALBUMIN 4.2 3.4*   No results for input(s): LIPASE, AMYLASE in the last 168 hours. No results for input(s): AMMONIA in the last 168 hours. Coagulation Profile:  Recent Labs Lab 01/06/16 1430  INR 1.29   Cardiac Enzymes: No results for input(s): CKTOTAL, CKMB, CKMBINDEX, TROPONINI in the last 168 hours. BNP (last 3 results) No results for input(s): PROBNP in the last 8760 hours. HbA1C: No results for input(s): HGBA1C in the last 72 hours. CBG:  Recent Labs Lab 01/09/16 1210 01/09/16 1626 01/09/16 2011 01/09/16 2317 01/10/16 0521  GLUCAP 146* 148* 142* 123* 146*   Lipid Profile: No results for input(s): CHOL, HDL, LDLCALC, TRIG, CHOLHDL, LDLDIRECT in the last 72 hours. Thyroid Function Tests: No results for input(s): TSH, T4TOTAL, FREET4, T3FREE, THYROIDAB in the last 72 hours. Anemia Panel: No results for input(s): VITAMINB12, FOLATE, FERRITIN, TIBC, IRON, RETICCTPCT in the last 72 hours. Urine analysis:    Component Value Date/Time   COLORURINE YELLOW 01/06/2016 0610   APPEARANCEUR CLOUDY* 01/06/2016 0610   LABSPEC 1.026 01/06/2016 0610   PHURINE 6.0 01/06/2016 0610   GLUCOSEU NEGATIVE 01/06/2016 0610   HGBUR LARGE*  01/06/2016 0610   BILIRUBINUR NEGATIVE 01/06/2016 0610   KETONESUR NEGATIVE 01/06/2016 0610   PROTEINUR 100* 01/06/2016 0610   NITRITE NEGATIVE 01/06/2016 0610   LEUKOCYTESUR NEGATIVE 01/06/2016 0610   Sepsis Labs: '@LABRCNTIP'$ (procalcitonin:4,lacticidven:4)  ) Recent Results (from the past 240 hour(s))  Culture, respiratory (NON-Expectorated)     Status: None   Collection Time: 12/31/15 12:10 PM  Result Value Ref Range Status   Specimen Description TRACHEAL ASPIRATE  Final   Special Requests Normal  Final   Gram Stain   Final    ABUNDANT WBC PRESENT,BOTH PMN AND MONONUCLEAR NO ORGANISMS SEEN    Culture NO GROWTH 2 DAYS  Final   Report Status 01/02/2016 FINAL  Final         Radiology Studies: No results found.      Scheduled Meds: . antiseptic oral rinse  7 mL Mouth Rinse QID  . chlorhexidine gluconate (SAGE KIT)  15 mL Mouth Rinse BID  . diltiazem  30 mg Oral Q6H  . free water  300 mL Per Tube Q4H  . insulin aspart  0-15 Units Subcutaneous Q4H  . metoprolol  10 mg Intravenous Q6H  . pantoprazole sodium  40 mg Per Tube Daily   Continuous Infusions: . feeding supplement (VITAL AF 1.2 CAL) 1,000 mL (01/09/16 1401)     LOS: 16 days    Time spent: 40 minutes    Dheeraj Hail, Geraldo Docker, MD [facility] Pager [phone]   If 7PM-7AM, please contact night-coverage [email or web address] Password [email or web address] 01/10/2016, 8:51 AM

## 2016-01-11 DIAGNOSIS — I2601 Septic pulmonary embolism with acute cor pulmonale: Secondary | ICD-10-CM

## 2016-01-11 DIAGNOSIS — N9489 Other specified conditions associated with female genital organs and menstrual cycle: Secondary | ICD-10-CM

## 2016-01-11 LAB — BASIC METABOLIC PANEL
Anion gap: 7 (ref 5–15)
BUN: 35 mg/dL — AB (ref 6–20)
CALCIUM: 8.7 mg/dL — AB (ref 8.9–10.3)
CO2: 22 mmol/L (ref 22–32)
CREATININE: 0.49 mg/dL (ref 0.44–1.00)
Chloride: 117 mmol/L — ABNORMAL HIGH (ref 101–111)
GFR calc Af Amer: >60 mL/min (ref >60)
Glucose, Bld: 159 mg/dL — ABNORMAL HIGH (ref 65–99)
Potassium: 3.2 mmol/L — ABNORMAL LOW (ref 3.5–5.1)
SODIUM: 146 mmol/L — AB (ref 135–145)

## 2016-01-11 LAB — CBC WITH DIFFERENTIAL/PLATELET
BASOS ABS: 0 10*3/uL (ref 0.0–0.1)
BASOS PCT: 0 %
EOS ABS: 0.3 10*3/uL (ref 0.0–0.7)
EOS PCT: 2 %
HCT: 32.6 % — ABNORMAL LOW (ref 36.0–46.0)
Hemoglobin: 9.8 g/dL — ABNORMAL LOW (ref 12.0–15.0)
Lymphocytes Relative: 10 %
Lymphs Abs: 1.3 10*3/uL (ref 0.7–4.0)
MCH: 30.3 pg (ref 26.0–34.0)
MCHC: 30.1 g/dL (ref 30.0–36.0)
MCV: 100.9 fL — AB (ref 78.0–100.0)
MONO ABS: 0.6 10*3/uL (ref 0.1–1.0)
Monocytes Relative: 4 %
Neutro Abs: 10.6 10*3/uL — ABNORMAL HIGH (ref 1.7–7.7)
Neutrophils Relative %: 84 %
PLATELETS: 278 10*3/uL (ref 150–400)
RBC: 3.23 MIL/uL — ABNORMAL LOW (ref 3.87–5.11)
RDW: 19.2 % — AB (ref 11.5–15.5)
WBC: 12.7 10*3/uL — ABNORMAL HIGH (ref 4.0–10.5)

## 2016-01-11 LAB — MAGNESIUM: MAGNESIUM: 2.1 mg/dL (ref 1.7–2.4)

## 2016-01-11 LAB — GLUCOSE, CAPILLARY
GLUCOSE-CAPILLARY: 152 mg/dL — AB (ref 65–99)
GLUCOSE-CAPILLARY: 131 mg/dL — AB (ref 65–99)
GLUCOSE-CAPILLARY: 98 mg/dL (ref 65–99)
Glucose-Capillary: 124 mg/dL — ABNORMAL HIGH (ref 65–99)
Glucose-Capillary: 126 mg/dL — ABNORMAL HIGH (ref 65–99)

## 2016-01-11 MED ORDER — METOPROLOL TARTRATE 25 MG/10 ML ORAL SUSPENSION
25.0000 mg | Freq: Two times a day (BID) | ORAL | Status: DC
  Administered 2016-01-11 – 2016-01-13 (×4): 25 mg
  Filled 2016-01-11 (×6): qty 10

## 2016-01-11 MED ORDER — CETYLPYRIDINIUM CHLORIDE 0.05 % MT LIQD
7.0000 mL | Freq: Two times a day (BID) | OROMUCOSAL | Status: DC
  Administered 2016-01-11 – 2016-01-15 (×6): 7 mL via OROMUCOSAL

## 2016-01-11 MED ORDER — POTASSIUM CHLORIDE 20 MEQ/15ML (10%) PO SOLN
40.0000 meq | Freq: Two times a day (BID) | ORAL | Status: AC
  Administered 2016-01-11 (×2): 40 meq
  Filled 2016-01-11 (×2): qty 30

## 2016-01-11 MED ORDER — CHLORHEXIDINE GLUCONATE 0.12 % MT SOLN
15.0000 mL | Freq: Two times a day (BID) | OROMUCOSAL | Status: DC
  Administered 2016-01-11 – 2016-01-16 (×10): 15 mL via OROMUCOSAL
  Filled 2016-01-11 (×9): qty 15

## 2016-01-11 NOTE — Progress Notes (Signed)
PROGRESS NOTE    Bonnie Salazar  [id number] DOB: 1941/05/18 DOA: 12/25/2015 PCP: [name]   Brief Narrative:  75 y.o. WF without significant past medical history  She sustained an accidental fall at home. She tripped on a open drawer, causing her fall. She subsequently developed left hip pain, 10/10 in intensity with any movement. No LOC. No chest pain or shortness of breath. No swelling. No palpitations. She was taken to the OR for operative repair 6/16. The initial repair appeared uneventful and she was extubated in the PACU. Shortly thereafter, she complained of abdominal pain and was noted to have a tense abdomen. CT scan showed extravasation into the LLQ; she was taken to IR and underwent covered stent of the distal external iliac artery to the inguinal ligament. Vascular surgery was also consulted. No signs of distal ischemia on his exam. There is some concern for abdominal compartment syndrome, as well. MTP was initiated and she received 11u pRBC, 7 FFP, 2 PLT pheresis, 2 cryoprecipitate. Returned to ICU on mechanical ventilation. She suffered a PEA arrest 6/18. ICU course complicated by further bleeding. IVC filter placed.    Assessment & Plan:   Principal Problem:   Fracture of femoral neck, left (HCC) Active Problems:   Hip fracture (HCC)   Leukocytosis   Dehydration   Atrial thrombus (HCC)   Acute pulmonary embolism (HCC)   Acute respiratory failure (HCC)   Hemorrhagic shock   Cardiac arrest (HCC)   Acute pulmonary embolus (HCC)   Atrial mass   Bleeding   Pulmonary embolus, left (HCC)   Pulmonary embolus, right (HCC)   Acute respiratory failure with hypoxia (HCC)   PEA (Pulseless electrical activity) (HCC)   Right atrial thrombus (HCC)   Arterial hemorrhage   Hypovolemic shock (HCC)   Hypokalemia   Hypernatremia   Gastrointestinal hemorrhage with melena   Blood per rectum   Hemorrhage   Left femoral neck fracture after accidental  fall -Resolved. S/P repair by orthopedic -Out of bed to chair q shift  Acute Hypoxic Respiratory Failure  -Secondary to bilateral PE : Resolved -Titrate O2 to maintain SPO2> 92%  Positive Acute bilateral PE-->RV strain  -See acute respiratory failure  PEA Arrest on 6/18(~10 minutes)/Troponin elevation -Occurred Post thrombolysis   -Most likely secondary demand ischemia   Paroxysmal atrial fibrillation/severe MR -All anticoagulants on hold secondary to patient's multiple sites of bleeding. -Review chart appears heparin drip stopped on 6/21 -HR well controlled -Cardizem 30 mg QID -Change Metoprolol IV to Metoprolol suspension 25 mg per tube BID  RA thrombus - likely propagated from right pelvic vein compression from hematoma-->migrated and now with BILATERAL PE -S/P, Thrombolysis   Heart murmur  Arterial hemorrhage s/p stenting and requiring massive transfusion  -See significant events -6/16 transfuse to 14 units PRBC, 8 units FFP, 2 units cryoprecipitate, 2 units platelets -6/18 transfused 2 units PRBC, 8 units FFP -6/21 transfused 2 units PRBC Recent Labs Lab 01/07/16 0540 01/08/16 0519 01/09/16 0440 01/10/16 0358 01/11/16 0541  HGB 10.2* 10.8* 10.3* 9.8* 9.8*  -Stable  GI bleed  -Review chart; appears patient last received heparin on 6/21 -6/27 Overnight patient with significant melena/diarrhea -H/H Daily-Protonix 40 mg per tube BID -GI does not believe Ischemic colitis, Recommends watchful waiting. Appears bleeding has stopped  -See atrial hemorrhage  Hypovolemic Shock - resolved 6/21 -IVC filter in place  Hypokalemia -Potassium goal>4 -Potassium solution 40 mEq2 doses  Hypernatremia -Free water 328ml  q 4hr   Recent Labs Lab 01/08/16  [id number] 01/08/16 1902 01/09/16 0440 01/10/16 0358 01/11/16 0541  NA 159* 159* 157* 153* 146*  -Resolving continue to monitor closely  Protein Calorie Malnutrition -TF Vital AF 1.2-calorie at 69ml/hr  Anxiety and  post-op pain   Dysphagia -6/30 failed swallow study continue nothing by mouth -7/2 Repeat swallow study on Monday 7/3. If patient fails swallow study can will have to place PEG tube. Spoke with patient, husband, and family concerning this issue  Diarrhea -Most likely secondary to tube feeds. Patient afebrile,    Goals of care -Awaiting rehabilitation to evaluate patient for CIR placement. -Discussed placement of PEG tube with patient yesterday, if she does not pass repeat swallow study on Monday 7/3 will need PEG placement prior to discharge to CIR   DVT prophylaxis: SCD Code Status: Full Family Communication: Spoke with husband and family at length Disposition Plan: CIR. Awaiting evaluation   Consultants:  Dr. Carole Binning Orthopedics surgery Dr.Matthew Smithers surgery D.O. Corrie Mckusick  IR [facility]    Procedures/Significant Events:  6/16 TOTAL HIP ARTHROPLASTY;Lt ext iliac artery bleed/pelvic hematoma 6/16 CT pelvis with contrast;Active bleeding in the pelvis. Originating fm Proximal left common femoral artery/distal left external iliac artery. Bleeding near origin of the left inferior epigastric artery.-Large amount of blood and hematoma formation throughout the abdomen and pelvis. 6/16 left lower extremity angiogram; emergent covered stent embolization of distal external iliac artery branch vessel pseudoaneurysm/avulsion, with placement of stent graft 9mm x 5cm viabahn  ECHO 6/17 >> LVEF= 50-55%; - Mitral valve: Moderate prolapse w/ severe regurgitation. - Left atrium: mildly dilated.- Right ventricle: mildly dilated. 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 CT chest 6/17 >> Bilateral pulmonary emboli, nearly occlusive in the left lower lobe segmental branches, involving all of the segmental pulmonary arteries. Evidence of right heart strain with right/left heart ratio> 1.  -Heterogeneous area of hypoattenuation within the right atrium may represent right atrial clot versus mixing of non-opacified blood from the inferior vena cava. Bilateral pleural effusions and atelectatic changes of bilateral lung bases. LE [place] 6/17 >> negative for DVT ECHO 6/18 >> LVEF 55-60%, normal wall motion, moderate MVP, mod to severe MR, RA mildly dilated, RA with large moblie pedunculated and irregular mass that prolapses through the TV c/w thrombus CT ABD/Pelvis 6/18 >> no evidence of new intraperitoneal or pelvic hemorrhage, increasing hematoma in left proximal thigh, decreasing volume of pulmonary embolus in the bilateral lower lobes 6/18 TPA for right atrial thrombus 6/18; PEA arrest CT Head 6/18 >> No CT evidence of acute intracranial abnormality ECHO 6/19 >> LVEF 50-55%, normal wall motion, no RWMA, previous RA mass no longer demonstrated on this ECHO, mild increase in PA systolic pressure 99991111 infrarenal IVC filter placement      Cultures 6/20 blood left hand 2 negative   6/21 tracheal aspirate negative   Antimicrobials: Perioperative Ancef 6/20 Zosyn >>> 6/26 6/21 Vancomycin >> 6/23   Devices    LINES / TUBES:  Foley 6/16  PIV ETT 6/16-->6/17 >> 6/18 >> 6/23 L [place] DLC 6/16 >>    Continuous Infusions: . feeding supplement (VITAL AF 1.2 CAL) 1,000 mL (01/11/16 0100)     Subjective: 7/2 A/O 4, afebrile overnight, Laying in bed comfortably, follows all commands..    Objective: Filed Vitals:   01/10/16 2300 01/11/16 0002 01/11/16 0500 01/11/16 0731  BP: 98/52 112/60  107/63  Pulse:  86  93  Temp:  98.5  F (36.9 C)  98.4 F (36.9 C)  TempSrc:  Axillary  Oral  Resp:  22  25  Height:      Weight:   60.8 kg (134 lb 0.6 oz)   SpO2:  98%  98%    Intake/Output Summary (Last 24 hours) at 01/11/16 0835 Last data filed at 01/11/16 0100  Gross per 24 hour  Intake   2435 ml  Output     25 ml  Net   2410 ml   Filed Weights   01/09/16 0427 01/10/16  0352 01/11/16 0500  Weight: 60.4 kg (133 lb 2.5 oz) 61.2 kg (134 lb 14.7 oz) 60.8 kg (134 lb 0.6 oz)    Examination:  General: A/O 4, NAD, follows commands, No acute respiratory distress Eyes: negative scleral hemorrhage, negative anisocoria, negative icterus ENT: Negative Runny nose, negative gingival bleeding, Neck:  Negative scars, masses, torticollis, lymphadenopathy, JVD, left subclavian CVL covering clean. Lungs: Clear to auscultation bilaterally without wheezes or crackles Cardiovascular: Regular rate and rhythm without murmur gallop or rub normal S1 and S2 Abdomen:  Positive abdominal pain RLQ/LLQ ,  positive distention but soft, positive soft, bowel sounds, no rebound, no ascites, no appreciable mass Extremities: No significant cyanosis, clubbing, or edema bilateral lower extremities Skin: Negative rashes, lesions, ulcers GI; right red blood per rectum appears to have stopped. Continued watery diarrhea (most likely secondary to tube feeds) Central nervous system:  Cranial nerves II through XII intact, tongue/uvula midline, all extremities muscle strength 5/5, sensation intact throughout, unable to fully evaluate .     Data Reviewed: Care during the described time interval was provided by me .  I have reviewed this patient's available data, including medical history, events of note, physical examination, and all test results as part of my evaluation. I have personally reviewed and interpreted all radiology studies.  CBC:  Recent Labs Lab 01/06/16 0506  01/07/16 0540 01/08/16 0519 01/09/16 0440 01/10/16 0358 01/11/16 0541  WBC 20.4*  --  16.5* 16.1* 15.6* 12.7* 12.7*  NEUTROABS 18.0*  --   --   --  12.8* 10.3* 10.6*  HGB 11.3*  < > 10.2* 10.8* 10.3* 9.8* 9.8*  HCT 36.0  < > 33.7* 36.3 35.2* 33.9* 32.6*  MCV 99.4  --  98.0 101.1* 102.9* 103.0* 100.9*  PLT 260  --  274 316 301 289 278  < > = values in this interval not displayed. Basic Metabolic Panel:  Recent  Labs Lab 01/04/16 1030  01/04/16 1506 01/05/16 0335 01/05/16 1500  01/08/16 0519 01/08/16 1902 01/09/16 0440 01/10/16 0358 01/11/16 0541  NA  --   < >  --  146* 151*  < > 159* 159* 157* 153* 146*  K  --   < >  --  2.0* 3.0*  < > 3.0* 3.5 3.7 3.1* 3.2*  CL  --   < >  --  105 112*  < > 122* 128* 125* 124* 117*  CO2  --   < >  --  30 28  < > 28 27 25 25 22   GLUCOSE  --   < >  --  168* 123*  < > 160* 161* 166* 145* 159*  BUN  --   < >  --  84* 90*  < > 58* 49* 50* 44* 35*  CREATININE  --   < >  --  1.07* 1.09*  < > 0.86 0.71 0.70 0.52 0.49  CALCIUM  --   < >  --  10.2 10.4*  < > 9.6 9.2 9.1 8.9 8.7*  MG 1.3*  --  2.9* 2.9* 3.0*  < > 2.6* 2.6* 2.5* 2.3 2.1  PHOS 1.5*  --  3.1 3.0 2.1*  --   --   --   --   --   --   < > = values in this interval not displayed. GFR: Estimated Creatinine Clearance: 59.2 mL/min (by C-G formula based on Cr of 0.49). Liver Function Tests:  Recent Labs Lab 01/06/16 0506 01/08/16 0519  AST 48* 55*  ALT 33 47  ALKPHOS 78 73  BILITOT 2.0* 1.9*  PROT 7.3 6.6  ALBUMIN 4.2 3.4*   No results for input(s): LIPASE, AMYLASE in the last 168 hours. No results for input(s): AMMONIA in the last 168 hours. Coagulation Profile:  Recent Labs Lab 01/06/16 1430  INR 1.29   Cardiac Enzymes: No results for input(s): CKTOTAL, CKMB, CKMBINDEX, TROPONINI in the last 168 hours. BNP (last 3 results) No results for input(s): PROBNP in the last 8760 hours. HbA1C: No results for input(s): HGBA1C in the last 72 hours. CBG:  Recent Labs Lab 01/10/16 1300 01/10/16 1640 01/10/16 2005 01/10/16 2314 01/11/16 0423  GLUCAP 145* 136* 120* 141* 152*   Lipid Profile: No results for input(s): CHOL, HDL, LDLCALC, TRIG, CHOLHDL, LDLDIRECT in the last 72 hours. Thyroid Function Tests: No results for input(s): TSH, T4TOTAL, FREET4, T3FREE, THYROIDAB in the last 72 hours. Anemia Panel: No results for input(s): VITAMINB12, FOLATE, FERRITIN, TIBC, IRON, RETICCTPCT in the  last 72 hours. Urine analysis:    Component Value Date/Time   COLORURINE YELLOW 01/06/2016 0610   APPEARANCEUR CLOUDY* 01/06/2016 0610   LABSPEC 1.026 01/06/2016 0610   PHURINE 6.0 01/06/2016 0610   GLUCOSEU NEGATIVE 01/06/2016 0610   HGBUR LARGE* 01/06/2016 0610   BILIRUBINUR NEGATIVE 01/06/2016 0610   KETONESUR NEGATIVE 01/06/2016 0610   PROTEINUR 100* 01/06/2016 0610   NITRITE NEGATIVE 01/06/2016 0610   LEUKOCYTESUR NEGATIVE 01/06/2016 0610   Sepsis Labs: @LABRCNTIP (procalcitonin:4,lacticidven:4)  ) No results found for this or any previous visit (from the past 240 hour(s)).       Radiology Studies: No results found.      Scheduled Meds: . antiseptic oral rinse  7 mL Mouth Rinse q12n4p  . chlorhexidine  15 mL Mouth Rinse BID  . diltiazem  30 mg Oral Q6H  . free water  300 mL Per Tube Q4H  . insulin aspart  0-15 Units Subcutaneous Q4H  . metoprolol  10 mg Intravenous Q6H  . pantoprazole sodium  40 mg Per Tube Daily   Continuous Infusions: . feeding supplement (VITAL AF 1.2 CAL) 1,000 mL (01/11/16 0100)     LOS: 17 days    Time spent: 40 minutes    Alois Mincer, Geraldo Docker, MD [facility] Pager [phone]   If 7PM-7AM, please contact night-coverage [email or web address] Password [facility] 01/11/2016, 8:35 AM

## 2016-01-11 NOTE — Progress Notes (Signed)
Orthopaedic Trauma Service Progress Note  Subjective  Ortho issues stable  ROS   Objective   BP 112/60 mmHg  Pulse 99  Temp(Src) 98.6 F (37 C) (Oral)  Resp 26  Ht 5\' 7"  (1.702 m)  Wt 60.8 kg (134 lb 0.6 oz)  BMI 20.99 kg/m2  SpO2 97%  Intake/Output      07/01 0701 - 07/02 0700 07/02 0701 - 07/03 0700   P.O. 0    NG/GT 2490    Total Intake(mL/kg) 2490 (41)    Urine (mL/kg/hr) 0 (0)    Stool 25 (0)    Total Output 25     Net +2465          Urine Occurrence 8 x 3 x   Stool Occurrence 1 x 2 x     Exam  Gen: resting comfortably in bed, NAD  Ext:       Left Lower Extremity   Dressing stable  Incision evaluate   Steri strips intact   Incision looks good    No drainage  DPN, SPN, TN sensation grossly intact  EHL, FHL, AT, PT, peroneals, gastroc motor intact  + DP pulse   Swelling stable    Assessment and Plan   POD/HD#: 36  75 y/o female s/p fall with L femoral neck fracture and perioperative complications   - Left Femoral neck fracture s/p L THA  WBAT  PT/OT   Ok to clean wound with soap and water  Can leave uncovered if desired   CIR eval pending   -medical issues  Per primary team  - B PE  Per primary team   S/p IVC   - Dispo:  Continue per medicine service  Ortho will see at end of week     Jari Pigg, PA-C [facility] [phone] (P) [phone] (O) 01/11/2016 2:26 PM

## 2016-01-12 ENCOUNTER — Inpatient Hospital Stay (HOSPITAL_COMMUNITY): Payer: Medicare HMO

## 2016-01-12 LAB — CBC WITH DIFFERENTIAL/PLATELET
BASOS ABS: 0 10*3/uL (ref 0.0–0.1)
Basophils Relative: 0 %
Eosinophils Absolute: 0.2 10*3/uL (ref 0.0–0.7)
Eosinophils Relative: 2 %
HEMATOCRIT: 31.2 % — AB (ref 36.0–46.0)
Hemoglobin: 9.5 g/dL — ABNORMAL LOW (ref 12.0–15.0)
LYMPHS PCT: 11 %
Lymphs Abs: 1.2 10*3/uL (ref 0.7–4.0)
MCH: 30.4 pg (ref 26.0–34.0)
MCHC: 30.4 g/dL (ref 30.0–36.0)
MCV: 99.7 fL (ref 78.0–100.0)
MONO ABS: 0.7 10*3/uL (ref 0.1–1.0)
MONOS PCT: 6 %
NEUTROS ABS: 9 10*3/uL — AB (ref 1.7–7.7)
Neutrophils Relative %: 81 %
Platelets: 287 10*3/uL (ref 150–400)
RBC: 3.13 MIL/uL — ABNORMAL LOW (ref 3.87–5.11)
RDW: 18.9 % — AB (ref 11.5–15.5)
WBC: 11.1 10*3/uL — ABNORMAL HIGH (ref 4.0–10.5)

## 2016-01-12 LAB — BASIC METABOLIC PANEL
Anion gap: 17 — ABNORMAL HIGH (ref 5–15)
BUN: 28 mg/dL — AB (ref 6–20)
CHLORIDE: 110 mmol/L (ref 101–111)
CO2: 21 mmol/L — ABNORMAL LOW (ref 22–32)
Calcium: 9.8 mg/dL (ref 8.9–10.3)
Creatinine, Ser: 0.45 mg/dL (ref 0.44–1.00)
GFR calc Af Amer: >60 mL/min (ref >60)
GFR calc non Af Amer: >60 mL/min (ref >60)
GLUCOSE: 130 mg/dL — AB (ref 65–99)
POTASSIUM: 3.6 mmol/L (ref 3.5–5.1)
Sodium: 148 mmol/L — ABNORMAL HIGH (ref 135–145)

## 2016-01-12 LAB — GLUCOSE, CAPILLARY
GLUCOSE-CAPILLARY: 139 mg/dL — AB (ref 65–99)
GLUCOSE-CAPILLARY: 117 mg/dL — AB (ref 65–99)
GLUCOSE-CAPILLARY: 122 mg/dL — AB (ref 65–99)
Glucose-Capillary: 109 mg/dL — ABNORMAL HIGH (ref 65–99)
Glucose-Capillary: 119 mg/dL — ABNORMAL HIGH (ref 65–99)
Glucose-Capillary: 124 mg/dL — ABNORMAL HIGH (ref 65–99)

## 2016-01-12 LAB — MAGNESIUM: Magnesium: 2.1 mg/dL (ref 1.7–2.4)

## 2016-01-12 MED ORDER — RESOURCE THICKENUP CLEAR PO POWD
ORAL | Status: DC | PRN
  Filled 2016-01-12: qty 125

## 2016-01-12 MED ORDER — POTASSIUM CHLORIDE 20 MEQ PO PACK
40.0000 meq | PACK | Freq: Once | ORAL | Status: DC
  Filled 2016-01-12: qty 2

## 2016-01-12 NOTE — Progress Notes (Signed)
Inpatient Rehabilitation  Pt. Currently off the floor for swallow study. I will follow up once pt. Is back on floor.  [place] Admissions Coordinator Cell [phone] Office [phone]

## 2016-01-12 NOTE — Progress Notes (Signed)
MBSS complete. Full report located under chart review in imaging section.  Xxavier Noon Paiewonsky, M.A. CCC-SLP [phone]  

## 2016-01-12 NOTE — Care Management Important Message (Signed)
Important Message  Patient Details  Name: Bonnie Salazar MRN: [id number] Date of Birth: [birth year]   Medicare Important Message Given:  Yes    Nathen May 01/12/2016, 11:28 AM

## 2016-01-12 NOTE — Progress Notes (Signed)
Physical Therapy Treatment Patient Details Name: Bonnie Salazar MRN: [id number] DOB: Oct 25, 1940 Today's Date: 01/12/2016    History of Present Illness      PT Comments    Pt progressing slowly.  Pt limited due to fatigue and perserveration on saving her energy for her swallow study.  Pt performed 1 standing trial x 3 min with flexed posture.  Will continue tx during acute hospitalization.    Follow Up Recommendations  CIR     Equipment Recommendations  Rolling walker with 5" wheels;3in1 (PT)    Recommendations for Other Services       Precautions / Restrictions Precautions Precautions: Fall Restrictions Weight Bearing Restrictions: Yes LLE Weight Bearing: Weight bearing as tolerated    Mobility  Bed Mobility Overal bed mobility: Needs Assistance Bed Mobility: Supine to Sit     Supine to sit: Mod assist Sit to supine: Max assist   General bed mobility comments: Able to initiate movement of Bil LEs but requires assist to get them to EOB, assist to elevate trunk and to scoot to EOB. Pt required assist for sit to supine with support for upper trunk control and LE advance ment back into bed.  Pt then required +2 total assist to scoot in supine position and support for head.  Pt significantly weak during session which limits mobility.    Transfers Overall transfer level: Needs assistance Equipment used: Rolling walker (2 wheeled) Transfers: Sit to/from Stand Sit to Stand: +2 safety/equipment;Mod assist         General transfer comment: Assist to boost and steady w/ cues for hand placement and technique.  Pt able to correct posterior bias and flexed posture this session w/ verbal and tactile cues.  PTA provided cues for hip extension and upper trunk extension to improve posture.  Pt able to stand for 3 min trial and refused additonal trial, reports she needs her strength for swallow study.  PTA/OT educated pt to sit up in chair on return from test to improve OOB  tolerance.    Ambulation/Gait                 Stairs            Wheelchair Mobility    Modified Rankin (Stroke Patients Only)       Balance Overall balance assessment: Needs assistance   Sitting balance-Leahy Scale: Fair       Standing balance-Leahy Scale: Poor                      Cognition Arousal/Alertness: Awake/alert Behavior During Therapy: Flat affect Overall Cognitive Status: Impaired/Different from baseline Area of Impairment: Attention       Following Commands: Follows one step commands with increased time     Problem Solving: Decreased initiation      Exercises      General Comments        Pertinent Vitals/Pain Pain Assessment: 0-10 Faces Pain Scale: Hurts little more Pain Location: L hip Pain Descriptors / Indicators: Discomfort Pain Intervention(s): Monitored during session;Repositioned    Home Living                      Prior Function            PT Goals (current goals can now be found in the care plan section) Acute Rehab PT Goals Potential to Achieve Goals: Fair Progress towards PT goals: Progressing toward goals    Frequency  Min 5X/week  PT Plan Current plan remains appropriate    Co-evaluation   Reason for Co-Treatment: Complexity of the patient's impairments (multi-system involvement);For patient/therapist safety (poor activity tolerance.  ) PT goals addressed during session: Mobility/safety with mobility;Balance       End of Session Equipment Utilized During Treatment: Gait belt Activity Tolerance: Patient limited by fatigue Patient left: with call bell/phone within reach;with family/visitor present;in bed     Time: [id number] PT Time Calculation (min) (ACUTE ONLY): 28 min  Charges:  $Therapeutic Activity: 8-22 mins                    G Codes:      Cristela Blue 01-23-16, 11:38 AM  Governor Rooks, PTA pager [phone]

## 2016-01-12 NOTE — Progress Notes (Signed)
Occupational Therapy Treatment Patient Details Name: Bonnie Salazar MRN: [id number] DOB: [birth year] Today's Date: 01/12/2016    History of present illness 75 y.o. female admitted to [facility] on 12/25/15 s/p fall with resultant L hip fx s/p L direct anterior THA, WBAT. Pt was extubated in the PACU. Shortly thereafter, she complained of abdominal pain and was noted to have a tense abdomen. CT scan showed extravasation into the LLQ; she was taken to IR and underwent covered stent of the distal external iliac artery to the inguinal ligament. Vascular surgery was also consulted. No signs of distal ischemia on his exam. There is some concern for abdominal compartment syndrome, as well. MTP was initiated and she received 11u pRBC, 7FFP, 2 PLT pheresis, 2 cryoprecipitate. Returned to ICU on mechanical ventilation. She suffered a PEA arrest 6/18. ICU course complicated by further bleeding. IVC filter placed. Intubated 6/16-6/17/17 and 6/18-6/23/17. Pt with significant PMHx of back surgery listed in chart. bil PE found on CT 6/17.    OT comments  Pt progressing slowly with OT goals. Pt limited due to fatigue and perserveration on saving her energy for her swallow study. Pt performed 1 standing trial x 3 min with flexed posture. Instructed pt to stand once more for 1 minute, however pt refused and her husband did not encourage her to continue participating. [name] stated, " Ya'll should wait and come back later til after she has that swallow study because she needs to save her energy for that". OT explained to pt that therapy most likely will not be back later today. [name] then stated, " I don't care if you come back or not, she needs to save her energy".  Returned pt to bed. Will continue tx during acute hospitalization.  Follow Up Recommendations  CIR;Supervision/Assistance - 24 hour    Equipment Recommendations  3 in 1 bedside comode;Tub/shower bench    Recommendations for Other Services       Precautions / Restrictions Precautions Precautions: Fall Restrictions Weight Bearing Restrictions: Yes LLE Weight Bearing: Weight bearing as tolerated       Mobility Bed Mobility Overal bed mobility: Needs Assistance Bed Mobility: Supine to Sit     Supine to sit: Mod assist Sit to supine: Max assist   General bed mobility comments: Able to initiate movement of Bil LEs but requires assist to get them to EOB, assist to elevate trunk and to scoot to EOB. Pt required assist for sit to supine with support for upper trunk control and LE advance ment back into bed.  Pt then required +2 total assist to scoot in supine position and support for head.  Pt significantly weak during session which limits mobility.    Transfers Overall transfer level: Needs assistance Equipment used: Rolling walker (2 wheeled) Transfers: Sit to/from Stand Sit to Stand: +2 safety/equipment;Mod assist Stand pivot transfers: +2 safety/equipment;Min assist       General transfer comment: Assist to boost and steady w/ cues for hand placement and technique.  Pt able to correct posterior bias and flexed posture this session w/ verbal and tactile cues.  PTA provided cues for hip extension and upper trunk extension to improve posture.  Pt able to stand for 3 min trial and refused additonal trial, reports she needs her strength for swallow study.  PTA/OT educated pt to sit up in chair on return from test to improve OOB tolerance. [name] did not encourage pt to participate more with therapy, he stated because she needed to save  her energy for her swallow study this morning      Balance Overall balance assessment: Needs assistance   Sitting balance-Leahy Scale: Fair       Standing balance-Leahy Scale: Poor                     ADL  did not participate in thi session                                       General ADL Comments: refused to stand a second time for [facility] T/F        Vision                                Cognition   Behavior During Therapy: Flat affect Overall Cognitive Status: Impaired/Different from baseline Area of Impairment: Attention   Current Attention Level: Sustained    Following Commands: Follows one step commands with increased time     Problem Solving: Decreased initiation      Extremity/Trunk Assessment   generalized weakness                        General Comments  pt pleasant, fatigues easily    Pertinent Vitals/ Pain       Pain Assessment: Faces Pain Score: 4  Faces Pain Scale: Hurts little more Pain Location: L hip Pain Descriptors / Indicators: Discomfort Pain Intervention(s): Monitored during session;Repositioned  Home Living  lives at home with spouse                                        Prior Functioning/Environment  independent           Frequency Min 2X/week     Progress Toward Goals  OT Goals(current goals can now be found in the care plan section)  Progress towards OT goals: OT to reassess next treatment     Plan Frequency remains appropriate    Co-evaluation    PT/OT/SLP Co-Evaluation/Treatment: Yes Reason for Co-Treatment: Complexity of the patient's impairments (multi-system involvement);For patient/therapist safety PT goals addressed during session: Mobility/safety with mobility;Balance OT goals addressed during session: ADL's and self-care;Strengthening/ROM      End of Session Equipment Utilized During Treatment: Rolling walker;Gait belt   Activity Tolerance Patient limited by fatigue   Patient Left with call bell/phone within reach;in bed;with family/visitor present   Nurse Communication          Time: [id number] OT Time Calculation (min): 27 min  Charges: OT General Charges $OT Visit: 1 Procedure OT Treatments $Therapeutic Activity: 8-22 mins  Britt Bottom 01/12/2016, 12:20 PM

## 2016-01-12 NOTE — Consult Note (Signed)
Judson consulted, however team evaluated this patient on Friday for similar concerns. Verified with bedside nurse no acute changes, patient with MASD and some blistering.  No noted purple area on the sacrum or coccyx per bedside nurse at the time of her assessment today.    Discussed POC bedside nurse.  Re consult if needed, will not follow at this time. Thanks  Tymeshia Awan [employer], RN,CNS, [place] [phone])

## 2016-01-12 NOTE — Progress Notes (Signed)
Speech Language Pathology Treatment: Dysphagia  Patient Details Name: Bonnie Salazar MRN: [id number] DOB: Nov 26, 1940 Today's Date: 01/12/2016 Time: [id number] SLP Time Calculation (min) (ACUTE ONLY): 20 min  Assessment / Plan / Recommendation Clinical Impression  Pt seen at bedside with therapeutic PO trials to determine readiness for repeat MBS. Per MD note, pt will need PEG placed if unable to start PO diet after reassessment today. Her breath support for speech is improving with better volume and longer productions per breath with Mod cues provided to initiate phonation. Pt consumed ~2 ounces of honey thick liquids by spoon with no overt s/s of aspiration. Min cues provided for use of second swallow. Although pt has not had much time since first MBS, she is showing subtle improvements and therefore would proceed with MBS prior to pursuing PEG placement. Will plan for later this morning.   HPI HPI: Pt is a 75 y.o. female without significant PMH, admitted 6/15 after sustaining a fall with L femoral neck fracture. Pt was intubated 6/16 for surgery, extubated 6/17. On 6/18 pt with sudden drop in BP/ back pain while on TPA, then PEA arrest and required resuscitation and was re-intubated, extubated 6/23. Noted to have strong cough and able to speak name. Most recent CXR showed persistent atelectasis versus consolidation LLL. Bedside swallow eval ordered due to prolonged/ multiple intubations. MBS 6/28 revealed moderate dysphagia with frank penetration of puree and honey-thick consistencies.  Recs for continued NPO; trial POs with SLP.       SLP Plan  MBS     Recommendations  Diet recommendations: NPO Medication Administration: Via alternative means             Oral Care Recommendations: Oral care QID Follow up Recommendations: Inpatient Rehab Plan: MBS     GO               Germain Osgood, M.A. CCC-SLP [phone]  Germain Osgood 01/12/2016, 9:51 AM

## 2016-01-12 NOTE — Progress Notes (Signed)
PROGRESS NOTE  Bonnie Salazar [id number] DOB: 04-21-1941 DOA: 12/25/2015 PCP: [name]   LOS: 18 days   Brief Narrative: 75 y.o. WF without significant past medical history  She sustained an accidental fall at home. She tripped on a open drawer, causing her fall. She subsequently developed left hip pain, 10/10 in intensity with any movement. No LOC. No chest pain or shortness of breath. No swelling. No palpitations. She was taken to the OR for operative repair 6/16. The initial repair appeared uneventful and she was extubated in the PACU. Shortly thereafter, she complained of abdominal pain and was noted to have a tense abdomen. CT scan showed extravasation into the LLQ; she was taken to IR and underwent covered stent of the distal external iliac artery to the inguinal ligament. Vascular surgery was also consulted. No signs of distal ischemia on his exam. There is some concern for abdominal compartment syndrome, as well. She received total of 11u pRBC, 7 FFP, 2 PLT pheresis, 2 cryoprecipitate.Returned to ICU on mechanical ventilation.She suffered a PEA arrest 6/18. ICU course complicated by further GI bleeding.IVC filter placed.   Assessment & Plan: Principal Problem:   Fracture of femoral neck, left (HCC) Active Problems:   Hip fracture (HCC)   Leukocytosis   Dehydration   Atrial thrombus (HCC)   Acute pulmonary embolism (HCC)   Acute respiratory failure (HCC)   Hemorrhagic shock   Cardiac arrest (HCC)   Acute pulmonary embolus (HCC)   Atrial mass   Bleeding   Pulmonary embolus, left (HCC)   Pulmonary embolus, right (HCC)   Acute respiratory failure with hypoxia (HCC)   PEA (Pulseless electrical activity) (HCC)   Right atrial thrombus (HCC)   Arterial hemorrhage   Hypovolemic shock (HCC)   Hypokalemia   Hypernatremia   Gastrointestinal hemorrhage with melena   Blood per rectum   Hemorrhage   Female pelvic hematoma  Left femoral neck fracture  after accidental fall - S/P repair by orthopedic 6/16. WBAT - Out of bed to chair q shift - PT/OT evaluation recommending CIR, patient accepted pending swallow evaluation  Acute Hypoxic Respiratory Failure  - Secondary to bilateral PE : Resolved - O2 sat has been >95% on RA   Positive Acute bilateral PE-->RV strain  - due to probable propagation from right pelvic vein where might have formed in the setting of compression from hematoma - patient is s/p thrombolysis per IR   PEA Arrest on 6/18(~10 minutes)/Troponin elevation - Occurred Post thrombolysis  - Most likely secondary demand ischemia   Paroxysmal atrial fibrillation/severe MR - All anticoagulants on hold secondary to patient's multiple sites of bleeding. - Review chart appears heparin drip stopped on 6/21 - Sever mitral regurgitation resolved on repeat echo 6/19 - HR well controlled. Continue Cardizem 30 mg QID and switch Metoprolol 25 mg BID to PO  RA thrombus - likely propagated from right pelvic vein compression from hematoma-->migrated and causing BILATERAL PE - S/P Thrombolysis by IR - RA thrombus no longer present on repeat echo 6/19  Arterial hemorrhage s/p stenting and requiring massive transfusion  - See significant events - 6/16 transfuse to 14 units PRBC, 8 units FFP, 2 units cryoprecipitate, 2 units platelets - 6/18 transfused 2 units PRBC, 8 units FFP - 6/21 transfused 2 units PRBC - H/H stable, continue to monitor.   GI bleed  - Review chart; appears patient last received heparin on 6/21 - 6/27 Overnight patient with significant melena/diarrhea - Switch Protonix 40 mg BID to PO.  H/H stable.  - GI does not believe Ischemic colitis, recommends watchful waiting. No new episodes of melena.   Hypovolemic Shock - resolved 6/21 - IVC filter in place  Hypokalemia - Repeat Potassium improving, though still below goal of >4 - replete again today   Hypernatremia  - Continue free water 322ml q 4hr  -  Resolving, continue to monitor closely  Protein Calorie Malnutrition - Discontinue TF Vital AF 1.2 and switch to PO supplementation since patient passed swallow eval.   Dysphagia - 6/30 failed swallow study continue nothing by mouth, repeat study 7/3 showed improvement, change diet to dysphagia 1 with nectar thick liquids - Trial off tube feeds. Switch medications to PO, crushed in puree if needed. If tolerating PO, can remove NG tube tomorrow am.   Diarrhea - Most likely secondary to tube feeds. Patient afebrile.  - Trial off tube feeds, if diarrhea continues, obtain stool for c diff screen   Mild metabolic acidosis  - Possibly combination of GI loss and malnutrition - Continue to monitor    DVT prophylaxis: SCD/IVC filter Code Status: Full Family Communication: Husband at bedside Disposition Plan: CIR 1 day   Consultants:   Orthopedics surgery  General surgery  IR  Cardiology  Procedures:   6/16 TOTAL HIP ARTHROPLASTY;Lt ext iliac artery bleed/pelvic hematoma  6/16 CT pelvis with contrast;Active bleeding in the pelvis. Originating fm Proximal left common femoral artery/distal left external iliac artery. Bleeding near origin of the left inferior epigastric artery.-Large amount of blood and hematoma formation throughout the abdomen and pelvis.  6/16 left lower extremity angiogram; emergent covered stent embolization of distal external iliac artery branch vessel  pseudoaneurysm/avulsion, with placement of stent graft 29mm x 5cm viabahn  ECHO 6/17 >> LVEF= 50-55%; - Mitral valve: Moderate prolapse w/ severe regurgitation. - Left atrium: mildly dilated.- Right ventricle: mildly dilated. - Right atrium: large mobile mass in the right atrium that prolapses toward the mitral valve.  CT chest 6/17 >> Bilateral pulmonary emboli, nearly occlusive in the left lower lobe segmental branches, involving all of the segmental pulmonary arteries. Evidence of right heart strain with  right/left heart ratio> 1. -Heterogeneous area of hypoattenuation within the right atrium may represent right atrial clot versus mixing of non-opacified blood from the inferior vena cava. Bilateral pleural effusions and atelectatic changes of bilateral lung bases.  LE [place] 6/17 >> negative for DVT  ECHO 6/18 >> LVEF 55-60%, normal wall motion, moderate MVP, mod to severe MR, RA mildly dilated, RA with large moblie pedunculated and irregular mass that prolapses through the TV c/w thrombus  CT ABD/Pelvis 6/18 >> no evidence of new intraperitoneal or pelvic hemorrhage, increasing hematoma in left proximal thigh, decreasing volume of pulmonary embolus in the bilateral lower lobes  6/18 catheter directed TPA for right atrial thrombus  6/18; PEA arrest  CT Head 6/18 >> No CT evidence of acute intracranial abnormality  ECHO 6/19 >> LVEF 50-55%, normal wall motion, no RWMA, previous RA mass no longer demonstrated on this ECHO, mild increase in PA systolic pressure  [id number] infrarenal IVC filter placement  7/2 moved to floor  Antimicrobials:  Perioperative Ancef  6/20 Zosyn >>> 6/26  6/21 Vancomycin >> 6/23  Subjective: Patient with NG tube and hard to communicate. Husband at bedside. No significant pain. Weak.   Objective: Filed Vitals:   01/11/16 1615 01/11/16 2005 01/12/16 0346 01/12/16 0436  BP: 116/55 117/59 115/57   Pulse: 84 95 87   Temp: 98.7 F (37.1 C) 99.2  F (37.3 C) 98.5 F (36.9 C)   TempSrc: Oral Oral Oral   Resp: 24 18 18    Height:      Weight:    63.3 kg (139 lb 8.8 oz)  SpO2: 95% 95% 97%     Intake/Output Summary (Last 24 hours) at 01/12/16 1125 Last data filed at 01/11/16 2300  Gross per 24 hour  Intake   1510 ml  Output      0 ml  Net   1510 ml   Filed Weights   01/10/16 0352 01/11/16 0500 01/12/16 0436  Weight: 61.2 kg (134 lb 14.7 oz) 60.8 kg (134 lb 0.6 oz) 63.3 kg (139 lb 8.8 oz)    Examination: Constitutional: NAD Filed Vitals:   01/11/16 1615  01/11/16 2005 01/12/16 0346 01/12/16 0436  BP: 116/55 117/59 115/57   Pulse: 84 95 87   Temp: 98.7 F (37.1 C) 99.2 F (37.3 C) 98.5 F (36.9 C)   TempSrc: Oral Oral Oral   Resp: 24 18 18    Height:      Weight:    63.3 kg (139 lb 8.8 oz)  SpO2: 95% 95% 97%    Eyes: PERRL, lids and conjunctivae normal ENMT: Mucous membranes are dry. No oropharyngeal exudates. NG tube secured on nose and cheek.  Respiratory: clear to auscultation bilaterally, no wheezing, no crackles. Weak respiratory effort. No accessory muscle use.  Cardiovascular: Regular rate and rhythm, no murmurs / rubs / gallops. No LE edema.  Abdomen: no tenderness. Bowel sounds positive.  Musculoskeletal: no clubbing / cyanosis.  Neurologic: CN 2-12 grossly intact. Strength equal, 5-/5 in all 4.  Psychiatric: Normal judgment and insight. Normal mood.    Data Reviewed: I have personally reviewed following labs and imaging studies  CBC:  Recent Labs Lab 01/06/16 0506  01/08/16 0519 01/09/16 0440 01/10/16 0358 01/11/16 0541 01/12/16 0304  WBC 20.4*  < > 16.1* 15.6* 12.7* 12.7* 11.1*  NEUTROABS 18.0*  --   --  12.8* 10.3* 10.6* 9.0*  HGB 11.3*  < > 10.8* 10.3* 9.8* 9.8* 9.5*  HCT 36.0  < > 36.3 35.2* 33.9* 32.6* 31.2*  MCV 99.4  < > 101.1* 102.9* 103.0* 100.9* 99.7  PLT 260  < > 316 301 289 278 287  < > = values in this interval not displayed. Basic Metabolic Panel:  Recent Labs Lab 01/05/16 1500  01/08/16 1902 01/09/16 0440 01/10/16 0358 01/11/16 0541 01/12/16 0304  NA 151*  < > 159* 157* 153* 146* 148*  K 3.0*  < > 3.5 3.7 3.1* 3.2* 3.6  CL 112*  < > 128* 125* 124* 117* 110  CO2 28  < > 27 25 25 22  21*  GLUCOSE 123*  < > 161* 166* 145* 159* 130*  BUN 90*  < > 49* 50* 44* 35* 28*  CREATININE 1.09*  < > 0.71 0.70 0.52 0.49 0.45  CALCIUM 10.4*  < > 9.2 9.1 8.9 8.7* 9.8  MG 3.0*  < > 2.6* 2.5* 2.3 2.1 2.1  PHOS 2.1*  --   --   --   --   --   --   < > = values in this interval not  displayed. GFR: Estimated Creatinine Clearance: 60 mL/min (by C-G formula based on Cr of 0.45). Liver Function Tests:  Recent Labs Lab 01/06/16 0506 01/08/16 0519  AST 48* 55*  ALT 33 47  ALKPHOS 78 73  BILITOT 2.0* 1.9*  PROT 7.3 6.6  ALBUMIN 4.2 3.4*  No results for input(s): LIPASE, AMYLASE in the last 168 hours. No results for input(s): AMMONIA in the last 168 hours. Coagulation Profile:  Recent Labs Lab 01/06/16 1430  INR 1.29   Cardiac Enzymes: No results for input(s): CKTOTAL, CKMB, CKMBINDEX, TROPONINI in the last 168 hours. BNP (last 3 results) No results for input(s): PROBNP in the last 8760 hours. HbA1C: No results for input(s): HGBA1C in the last 72 hours. CBG:  Recent Labs Lab 01/11/16 1650 01/11/16 2008 01/12/16 0017 01/12/16 0344 01/12/16 0805  GLUCAP 131* 98 124* 119* 139*   Lipid Profile: No results for input(s): CHOL, HDL, LDLCALC, TRIG, CHOLHDL, LDLDIRECT in the last 72 hours. Thyroid Function Tests: No results for input(s): TSH, T4TOTAL, FREET4, T3FREE, THYROIDAB in the last 72 hours. Anemia Panel: No results for input(s): VITAMINB12, FOLATE, FERRITIN, TIBC, IRON, RETICCTPCT in the last 72 hours. Urine analysis:    Component Value Date/Time   COLORURINE YELLOW 01/06/2016 0610   APPEARANCEUR CLOUDY* 01/06/2016 0610   LABSPEC 1.026 01/06/2016 0610   PHURINE 6.0 01/06/2016 0610   GLUCOSEU NEGATIVE 01/06/2016 0610   HGBUR LARGE* 01/06/2016 0610   BILIRUBINUR NEGATIVE 01/06/2016 0610   KETONESUR NEGATIVE 01/06/2016 0610   PROTEINUR 100* 01/06/2016 0610   NITRITE NEGATIVE 01/06/2016 0610   LEUKOCYTESUR NEGATIVE 01/06/2016 0610   Sepsis Labs: Invalid input(s): PROCALCITONIN, LACTICIDVEN  No results found for this or any previous visit (from the past 240 hour(s)).    Radiology Studies: No results found.   Scheduled Meds: . antiseptic oral rinse  7 mL Mouth Rinse q12n4p  . chlorhexidine  15 mL Mouth Rinse BID  . diltiazem  30 mg  Oral Q6H  . free water  300 mL Per Tube Q4H  . insulin aspart  0-15 Units Subcutaneous Q4H  . metoprolol tartrate  25 mg Per Tube BID  . pantoprazole sodium  40 mg Per Tube Daily   Continuous Infusions: . feeding supplement (VITAL AF 1.2 CAL) 1,000 mL (01/12/16 1044)    Marzetta Board, MD, PhD [facility] Pager [phone]  If 7PM-7AM, please contact night-coverage [email or web address] Password [email or web address] 01/12/2016, 11:25 AM

## 2016-01-13 LAB — BASIC METABOLIC PANEL
ANION GAP: 5 (ref 5–15)
BUN: 24 mg/dL — ABNORMAL HIGH (ref 6–20)
CALCIUM: 8.8 mg/dL — AB (ref 8.9–10.3)
CO2: 23 mmol/L (ref 22–32)
Chloride: 112 mmol/L — ABNORMAL HIGH (ref 101–111)
Creatinine, Ser: 0.49 mg/dL (ref 0.44–1.00)
GLUCOSE: 118 mg/dL — AB (ref 65–99)
POTASSIUM: 3.5 mmol/L (ref 3.5–5.1)
SODIUM: 140 mmol/L (ref 135–145)

## 2016-01-13 LAB — GLUCOSE, CAPILLARY
GLUCOSE-CAPILLARY: 110 mg/dL — AB (ref 65–99)
GLUCOSE-CAPILLARY: 122 mg/dL — AB (ref 65–99)
GLUCOSE-CAPILLARY: 100 mg/dL — AB (ref 65–99)
Glucose-Capillary: 103 mg/dL — ABNORMAL HIGH (ref 65–99)
Glucose-Capillary: 108 mg/dL — ABNORMAL HIGH (ref 65–99)
Glucose-Capillary: 114 mg/dL — ABNORMAL HIGH (ref 65–99)

## 2016-01-13 LAB — CBC WITH DIFFERENTIAL/PLATELET
BASOS ABS: 0 10*3/uL (ref 0.0–0.1)
BASOS PCT: 0 %
EOS ABS: 0.2 10*3/uL (ref 0.0–0.7)
EOS PCT: 2 %
HCT: 32 % — ABNORMAL LOW (ref 36.0–46.0)
Hemoglobin: 9.7 g/dL — ABNORMAL LOW (ref 12.0–15.0)
LYMPHS PCT: 13 %
Lymphs Abs: 1.4 10*3/uL (ref 0.7–4.0)
MCH: 30 pg (ref 26.0–34.0)
MCHC: 30.3 g/dL (ref 30.0–36.0)
MCV: 99.1 fL (ref 78.0–100.0)
MONO ABS: 0.7 10*3/uL (ref 0.1–1.0)
Monocytes Relative: 7 %
Neutro Abs: 8.4 10*3/uL — ABNORMAL HIGH (ref 1.7–7.7)
Neutrophils Relative %: 78 %
PLATELETS: 305 10*3/uL (ref 150–400)
RBC: 3.23 MIL/uL — AB (ref 3.87–5.11)
RDW: 18.4 % — AB (ref 11.5–15.5)
WBC: 10.7 10*3/uL — AB (ref 4.0–10.5)

## 2016-01-13 LAB — MAGNESIUM: MAGNESIUM: 2 mg/dL (ref 1.7–2.4)

## 2016-01-13 MED ORDER — POTASSIUM CHLORIDE 20 MEQ PO PACK
40.0000 meq | PACK | Freq: Once | ORAL | Status: AC
  Administered 2016-01-13: 40 meq via ORAL
  Filled 2016-01-13: qty 2

## 2016-01-13 MED ORDER — INSULIN ASPART 100 UNIT/ML ~~LOC~~ SOLN
0.0000 [IU] | Freq: Three times a day (TID) | SUBCUTANEOUS | Status: DC
  Administered 2016-01-13 – 2016-01-16 (×7): 2 [IU] via SUBCUTANEOUS

## 2016-01-13 MED ORDER — INSULIN ASPART 100 UNIT/ML ~~LOC~~ SOLN: 0.0000 [IU] | Freq: Every day | SUBCUTANEOUS | Status: DC

## 2016-01-13 MED ORDER — PANTOPRAZOLE SODIUM 40 MG PO TBEC
40.0000 mg | DELAYED_RELEASE_TABLET | Freq: Every day | ORAL | Status: DC
  Administered 2016-01-14 – 2016-01-16 (×3): 40 mg via ORAL
  Filled 2016-01-13 (×3): qty 1

## 2016-01-13 MED ORDER — METOPROLOL TARTRATE 25 MG PO TABS
25.0000 mg | ORAL_TABLET | Freq: Two times a day (BID) | ORAL | Status: DC
  Administered 2016-01-13 – 2016-01-16 (×5): 25 mg via ORAL
  Filled 2016-01-13 (×6): qty 1

## 2016-01-13 NOTE — Progress Notes (Signed)
NG (dubhoff) discontinued per physician order without difficulty. Pt tolerated procedure well. No coughing noted. No emesis. Pt has tolerated po intake well this am.

## 2016-01-13 NOTE — Progress Notes (Signed)
Speech Language Pathology Treatment: Dysphagia  Patient Details Name: Bonnie Salazar MRN: [id number] DOB: 07-Feb-1941 Today's Date: 01/13/2016 Time: [id number] SLP Time Calculation (min) (ACUTE ONLY): 16 min  Assessment / Plan / Recommendation Clinical Impression  Skilled treatment session focused on addressing dysphagia goals. SLP facilitated session by providing skilled observation of thin liquids as patient and spouse report that she can swallow them just fine.  Patient continues to demonstrate multiple swallows with a likely delay followed by a weak cough response despite SLP verbal cues for a hard effortful cough.  SLP also facilitated session by reviewing MBS results, rationale, and recommendations with patient and spouse.  Minimal trials of Dys.1 textures and nectar-thick liquids were also observed with no overt s/s of aspiration.  Recommend to continue with current plan of care.     HPI HPI: Pt is a 75 y.o. female without significant PMH, admitted 6/15 after sustaining a fall with L femoral neck fracture. Pt was intubated 6/16 for surgery, extubated 6/17. On 6/18 pt with sudden drop in BP/ back pain while on TPA, then PEA arrest and required resuscitation and was re-intubated, extubated 6/23. Noted to have strong cough and able to speak name. Most recent CXR showed persistent atelectasis versus consolidation LLL. Bedside swallow eval ordered due to prolonged/ multiple intubations. MBS 6/28 revealed moderate dysphagia with frank penetration of puree and honey-thick consistencies.  Recs for continued NPO; trial POs with SLP.       SLP Plan  Continue with current plan of care     Recommendations  Diet recommendations: Dysphagia 1 (puree);Nectar-thick liquid Liquids provided via: Straw Medication Administration: Crushed with puree Supervision: Patient able to self feed;Full supervision/cueing for compensatory strategies;Staff to assist with self feeding Compensations: Slow rate;Small  sips/bites;Minimize environmental distractions;Multiple dry swallows after each bite/sip;Follow solids with liquid Postural Changes and/or Swallow Maneuvers: Seated upright 90 degrees             Oral Care Recommendations: Oral care BID Follow up Recommendations: Inpatient Rehab;24 hour supervision/assistance Plan: Continue with current plan of care     GO              Bonnie Salazar., CCC-SLP [id number]   [place] 01/13/2016, 2:05 PM

## 2016-01-13 NOTE — Progress Notes (Signed)
PROGRESS NOTE  Bonnie Salazar [id number] DOB: May 29, 1941 DOA: 12/25/2015 PCP: [name]   LOS: 19 days   Brief Narrative: 75 y.o. WF without significant past medical history  She sustained an accidental fall at home. She tripped on a open drawer, causing her fall. She subsequently developed left hip pain, 10/10 in intensity with any movement. No LOC. No chest pain or shortness of breath. No swelling. No palpitations. She was taken to the OR for operative repair 6/16. The initial repair appeared uneventful and she was extubated in the PACU. Shortly thereafter, she complained of abdominal pain and was noted to have a tense abdomen. CT scan showed extravasation into the LLQ; she was taken to IR and underwent covered stent of the distal external iliac artery to the inguinal ligament. Vascular surgery was also consulted. No signs of distal ischemia on his exam. There is some concern for abdominal compartment syndrome, as well. She received total of 11u pRBC, 7 FFP, 2 PLT pheresis, 2 cryoprecipitate.Returned to ICU on mechanical ventilation.She suffered a PEA arrest 6/18. ICU course complicated by further GI bleeding.IVC filter placed.   Assessment & Plan: Principal Problem:   Fracture of femoral neck, left (HCC) Active Problems:   Hip fracture (HCC)   Leukocytosis   Dehydration   Atrial thrombus (HCC)   Acute pulmonary embolism (HCC)   Acute respiratory failure (HCC)   Hemorrhagic shock   Cardiac arrest (HCC)   Acute pulmonary embolus (HCC)   Atrial mass   Bleeding   Pulmonary embolus, left (HCC)   Pulmonary embolus, right (HCC)   Acute respiratory failure with hypoxia (HCC)   PEA (Pulseless electrical activity) (HCC)   Right atrial thrombus (HCC)   Arterial hemorrhage   Hypovolemic shock (HCC)   Hypokalemia   Hypernatremia   Gastrointestinal hemorrhage with melena   Blood per rectum   Hemorrhage   Female pelvic hematoma    Left femoral neck fracture  after accidental fall - S/P repair by orthopedic 6/16. WBAT - Out of bed to chair q shift - PT/OT evaluation recommending CIR, patient accepted - insurance approval underway, discussed with rehab admission coordinator over the phone today   Positive Acute bilateral PE-->RV strain  - due to probable propagation from right pelvic vein where might have formed in the setting of compression from hematoma - patient is s/p thrombolysis per IR   Acute Hypoxic Respiratory Failure  - Secondary to bilateral PE : Resolved - O2 sat has been >95% on RA   PEA Arrest on 6/18(~10 minutes)/Troponin elevation - Occurred Post thrombolysis  - Most likely secondary demand ischemia   Paroxysmal atrial fibrillation/severe MR - All anticoagulants on hold secondary to patient's multiple sites of bleeding. - Review chart appears heparin drip stopped on 6/21 - Sever mitral regurgitation resolved on repeat echo 6/19 - HR well controlled. Continue Cardizem 30 mg QID and switch Metoprolol 25 mg BID to PO  RA thrombus - likely propagated from right pelvic vein compression from hematoma-->migrated and causing BILATERAL PE - S/P Thrombolysis by IR - RA thrombus no longer present on repeat echo 6/19  Arterial hemorrhage s/p stenting and requiring massive transfusion  - See significant events - 6/16 transfuse to 14 units PRBC, 8 units FFP, 2 units cryoprecipitate, 2 units platelets - 6/18 transfused 2 units PRBC, 8 units FFP - 6/21 transfused 2 units PRBC - H/H stable, continue to monitor.   GI bleed  - Review chart; appears patient last received heparin on 6/21 - 6/27  Overnight patient with significant melena/diarrhea - Switch Protonix 40 mg BID to PO. H/H stable.  - GI does not believe Ischemic colitis, recommends watchful waiting. No new episodes of melena.   Hypovolemic Shock - resolved 6/21 - IVC filter in place  Hypokalemia - Repeat Potassium improving, though still below goal of >4 - replete  again today   Hypernatremia  - Resolved, continue to monitor closely  Protein Calorie Malnutrition - Discontinue TF Vital AF 1.2 and switch to PO supplementation since patient passed swallow eval.   Dysphagia - 6/30 failed swallow study continue nothing by mouth, repeat study 7/3 showed improvement, change diet to dysphagia 1 with nectar thick liquids - Trial off tube feeds. Switch medications to PO, crushed in puree if needed. Tolerating po - discontinue NG tube today   Diarrhea - Most likely secondary to tube feeds. Patient afebrile. Diarrhea now almost resolved  Mild metabolic acidosis  - Possibly combination of GI loss and malnutrition - Continue to monitor     DVT prophylaxis: SCD/IVC filter Code Status: Full Family Communication: Husband at bedside Disposition Plan: CIR pending insurance authorization  Consultants:   Orthopedics surgery  General surgery  IR  Cardiology  Procedures:   6/16 TOTAL HIP ARTHROPLASTY;Lt ext iliac artery bleed/pelvic hematoma  6/16 CT pelvis with contrast;Active bleeding in the pelvis. Originating fm Proximal left common femoral artery/distal left external iliac artery. Bleeding near origin of the left inferior epigastric artery.-Large amount of blood and hematoma formation throughout the abdomen and pelvis.  6/16 left lower extremity angiogram; emergent covered stent embolization of distal external iliac artery branch vessel  pseudoaneurysm/avulsion, with placement of stent graft 62mm x 5cm viabahn  ECHO 6/17 >> LVEF= 50-55%; - Mitral valve: Moderate prolapse w/ severe regurgitation. - Left atrium: mildly dilated.- Right ventricle: mildly dilated. - Right atrium: large mobile mass in the right atrium that prolapses toward the mitral valve.  CT chest 6/17 >> Bilateral pulmonary emboli, nearly occlusive in the left lower lobe segmental branches, involving all of the segmental pulmonary arteries. Evidence of right heart strain with  right/left heart ratio> 1. -Heterogeneous area of hypoattenuation within the right atrium may represent right atrial clot versus mixing of non-opacified blood from the inferior vena cava. Bilateral pleural effusions and atelectatic changes of bilateral lung bases.  LE [place] 6/17 >> negative for DVT  ECHO 6/18 >> LVEF 55-60%, normal wall motion, moderate MVP, mod to severe MR, RA mildly dilated, RA with large moblie pedunculated and irregular mass that prolapses through the TV c/w thrombus  CT ABD/Pelvis 6/18 >> no evidence of new intraperitoneal or pelvic hemorrhage, increasing hematoma in left proximal thigh, decreasing volume of pulmonary embolus in the bilateral lower lobes  6/18 catheter directed TPA for right atrial thrombus  6/18; PEA arrest  CT Head 6/18 >> No CT evidence of acute intracranial abnormality  ECHO 6/19 >> LVEF 50-55%, normal wall motion, no RWMA, previous RA mass no longer demonstrated on this ECHO, mild increase in PA systolic pressure  [id number] infrarenal IVC filter placement  7/2 moved to floor  Antimicrobials:  Perioperative Ancef  6/20 Zosyn >>> 6/26  6/21 Vancomycin >> 6/23  Subjective: - feeling better this morning, happy that she is now able to eat. Continues to be very weak.  Objective: Filed Vitals:   01/12/16 0436 01/12/16 1551 01/12/16 2007 01/13/16 0404  BP:  122/64 112/64 115/56  Pulse:  99 100 86  Temp:  98.6 F (37 C) 98.1 F (36.7 C) 98 F (  36.7 C)  TempSrc:  Oral Oral Oral  Resp:  18    Height:      Weight: 63.3 kg (139 lb 8.8 oz)   64.2 kg (141 lb 8.6 oz)  SpO2:  96% 96% 98%    Intake/Output Summary (Last 24 hours) at 01/13/16 1448 Last data filed at 01/13/16 1128  Gross per 24 hour  Intake      0 ml  Output    200 ml  Net   -200 ml   Filed Weights   01/11/16 0500 01/12/16 0436 01/13/16 0404  Weight: 60.8 kg (134 lb 0.6 oz) 63.3 kg (139 lb 8.8 oz) 64.2 kg (141 lb 8.6 oz)    Examination: Constitutional: NAD Filed Vitals:    01/12/16 0436 01/12/16 1551 01/12/16 2007 01/13/16 0404  BP:  122/64 112/64 115/56  Pulse:  99 100 86  Temp:  98.6 F (37 C) 98.1 F (36.7 C) 98 F (36.7 C)  TempSrc:  Oral Oral Oral  Resp:  18    Height:      Weight: 63.3 kg (139 lb 8.8 oz)   64.2 kg (141 lb 8.6 oz)  SpO2:  96% 96% 98%    Eyes: PERRL, lids and conjunctivae normal ENMT: Mucous membranes are dry. No oropharyngeal exudates.  Respiratory: clear to auscultation bilaterally, no wheezing, no crackles. Weak respiratory effort. No accessory muscle use.  Cardiovascular: Regular rate and rhythm, no murmurs / rubs / gallops. No LE edema.  Abdomen: no tenderness. Bowel sounds positive.  Musculoskeletal: no clubbing / cyanosis.  Neurologic: non focal    Data Reviewed: I have personally reviewed following labs and imaging studies  CBC:  Recent Labs Lab 01/09/16 0440 01/10/16 0358 01/11/16 0541 01/12/16 0304 01/13/16 0340  WBC 15.6* 12.7* 12.7* 11.1* 10.7*  NEUTROABS 12.8* 10.3* 10.6* 9.0* 8.4*  HGB 10.3* 9.8* 9.8* 9.5* 9.7*  HCT 35.2* 33.9* 32.6* 31.2* 32.0*  MCV 102.9* 103.0* 100.9* 99.7 99.1  PLT 301 289 278 [id number]   Basic Metabolic Panel:  Recent Labs Lab 01/09/16 0440 01/10/16 0358 01/11/16 0541 01/12/16 0304 01/13/16 0340  NA 157* 153* 146* 148* 140  K 3.7 3.1* 3.2* 3.6 3.5  CL 125* 124* 117* 110 112*  CO2 25 25 22  21* 23  GLUCOSE 166* 145* 159* 130* 118*  BUN 50* 44* 35* 28* 24*  CREATININE 0.70 0.52 0.49 0.45 0.49  CALCIUM 9.1 8.9 8.7* 9.8 8.8*  MG 2.5* 2.3 2.1 2.1 2.0   GFR: Estimated Creatinine Clearance: 60 mL/min (by C-G formula based on Cr of 0.49). Liver Function Tests:  Recent Labs Lab 01/08/16 0519  AST 55*  ALT 47  ALKPHOS 73  BILITOT 1.9*  PROT 6.6  ALBUMIN 3.4*   No results for input(s): LIPASE, AMYLASE in the last 168 hours. No results for input(s): AMMONIA in the last 168 hours. Coagulation Profile: No results for input(s): INR, PROTIME in the last 168  hours. Cardiac Enzymes: No results for input(s): CKTOTAL, CKMB, CKMBINDEX, TROPONINI in the last 168 hours. BNP (last 3 results) No results for input(s): PROBNP in the last 8760 hours. HbA1C: No results for input(s): HGBA1C in the last 72 hours. CBG:  Recent Labs Lab 01/12/16 2015 01/13/16 0045 01/13/16 0411 01/13/16 0742 01/13/16 1132  GLUCAP 122* 108* 114* 103* 110*   Lipid Profile: No results for input(s): CHOL, HDL, LDLCALC, TRIG, CHOLHDL, LDLDIRECT in the last 72 hours. Thyroid Function Tests: No results for input(s): TSH, T4TOTAL, FREET4, T3FREE, THYROIDAB in the last  72 hours. Anemia Panel: No results for input(s): VITAMINB12, FOLATE, FERRITIN, TIBC, IRON, RETICCTPCT in the last 72 hours. Urine analysis:    Component Value Date/Time   COLORURINE YELLOW 01/06/2016 0610   APPEARANCEUR CLOUDY* 01/06/2016 0610   LABSPEC 1.026 01/06/2016 0610   PHURINE 6.0 01/06/2016 0610   GLUCOSEU NEGATIVE 01/06/2016 0610   HGBUR LARGE* 01/06/2016 0610   BILIRUBINUR NEGATIVE 01/06/2016 0610   KETONESUR NEGATIVE 01/06/2016 0610   PROTEINUR 100* 01/06/2016 0610   NITRITE NEGATIVE 01/06/2016 0610   LEUKOCYTESUR NEGATIVE 01/06/2016 0610   Sepsis Labs: Invalid input(s): PROCALCITONIN, LACTICIDVEN  No results found for this or any previous visit (from the past 240 hour(s)).    Radiology Studies: Dg Swallowing Func-speech Pathology  01/12/2016  Objective Swallowing Evaluation: Type of Study: MBS-Modified Barium Swallow Study Patient Details Name: Bonnie Salazar MRN: [id number] Date of Birth: Aug 22, 1940 Today's Date: 01/12/2016 Time: SLP Start Time (ACUTE ONLY): 1116-SLP Stop Time (ACUTE ONLY): 1133 SLP Time Calculation (min) (ACUTE ONLY): 17 min Past Medical History: Past Medical History Diagnosis Date . Medical history non-contributory  . Acute pulmonary embolism ([place]) 12/28/2015 Past Surgical History: Past Surgical History Procedure Laterality Date . Back surgery   . Total hip arthroplasty  Left 12/26/2015   Procedure: TOTAL HIP ARTHROPLASTY ANTERIOR APPROACH;  Surgeon: Renette Butters, MD;  Location: [facility];  Service: Orthopedics;  Laterality: Left; HPI: Pt is a 75 y.o. female without significant PMH, admitted 6/15 after sustaining a fall with L femoral neck fracture. Pt was intubated 6/16 for surgery, extubated 6/17. On 6/18 pt with sudden drop in BP/ back pain while on TPA, then PEA arrest and required resuscitation and was re-intubated, extubated 6/23. Noted to have strong cough and able to speak name. Most recent CXR showed persistent atelectasis versus consolidation LLL. Bedside swallow eval ordered due to prolonged/ multiple intubations. MBS 6/28 revealed moderate dysphagia with frank penetration of puree and honey-thick consistencies.  Recs for continued NPO; trial POs with SLP.  Subjective: pt alert, cooperative but not very communicative Assessment / Plan / Recommendation CHL IP CLINICAL IMPRESSIONS 01/12/2016 Therapy Diagnosis Mild oral phase dysphagia;Moderate oral phase dysphagia;Mild pharyngeal phase dysphagia;Moderate pharyngeal phase dysphagia Clinical Impression Pt shows some improved strength since previous MBS with mildly less residue that also clears more with Mod cues for second swallow. Thicker textures and solids leave more residue, but thin liquids are silently penetrated before the swallow due to delay in swallow trigger. Penetration is shallow and in small volumes, but is silent and requires cues for stronger volitional cough to clear. She protects her airway better with even large boluses of nectar thick liquids, which also leave less residue than the honey thick liquids. Recommend to initiate Dys 1 diet and nectar thick liquids with use of second swallows and alternating solids/liquids. Take frequent rest breaks for energy conservation.  Impact on safety and function Mild aspiration risk;Moderate aspiration risk   CHL IP TREATMENT RECOMMENDATION 01/12/2016 Treatment  Recommendations Therapy as outlined in treatment plan below   Prognosis 01/12/2016 Prognosis for Safe Diet Advancement Good Barriers to Reach Goals -- Barriers/Prognosis Comment -- CHL IP DIET RECOMMENDATION 01/12/2016 SLP Diet Recommendations Dysphagia 1 (Puree) solids;Nectar thick liquid Liquid Administration via Cup;Straw Medication Administration Crushed with puree Compensations Slow rate;Small sips/bites;Minimize environmental distractions;Multiple dry swallows after each bite/sip;Follow solids with liquid Postural Changes Remain semi-upright after after feeds/meals (Comment);Seated upright at 90 degrees   CHL IP OTHER RECOMMENDATIONS 01/12/2016 Recommended Consults -- Oral Care Recommendations Oral care BID Other Recommendations  Order thickener from pharmacy;Prohibited food (jello, ice cream, thin soups);Remove water pitcher   CHL IP FOLLOW UP RECOMMENDATIONS 01/12/2016 Follow up Recommendations Inpatient Rehab   CHL IP FREQUENCY AND DURATION 01/12/2016 Speech Therapy Frequency (ACUTE ONLY) min 2x/week Treatment Duration 2 weeks      CHL IP ORAL PHASE 01/12/2016 Oral Phase Impaired Oral - Pudding Teaspoon -- Oral - Pudding Cup -- Oral - Honey Teaspoon Weak lingual manipulation;Reduced posterior propulsion;Delayed oral transit;Lingual/palatal residue Oral - Honey Cup NT Oral - Nectar Teaspoon Weak lingual manipulation;Reduced posterior propulsion;Delayed oral transit Oral - Nectar Cup Weak lingual manipulation;Reduced posterior propulsion;Delayed oral transit Oral - Nectar Straw Weak lingual manipulation;Reduced posterior propulsion;Delayed oral transit Oral - Thin Teaspoon Weak lingual manipulation;Reduced posterior propulsion;Delayed oral transit Oral - Thin Cup -- Oral - Thin Straw -- Oral - Puree Weak lingual manipulation;Reduced posterior propulsion;Delayed oral transit;Lingual/palatal residue Oral - Mech Soft Weak lingual manipulation;Reduced posterior propulsion;Delayed oral transit;Impaired  mastication;Lingual/palatal residue Oral - Regular -- Oral - Multi-Consistency -- Oral - Pill -- Oral Phase - Comment --  CHL IP PHARYNGEAL PHASE 01/12/2016 Pharyngeal Phase Impaired Pharyngeal- Pudding Teaspoon -- Pharyngeal -- Pharyngeal- Pudding Cup -- Pharyngeal -- Pharyngeal- Honey Teaspoon Delayed swallow initiation-vallecula;Reduced anterior laryngeal mobility;Reduced laryngeal elevation;Reduced tongue base retraction;Pharyngeal residue - valleculae Pharyngeal -- Pharyngeal- Honey Cup NT Pharyngeal -- Pharyngeal- Nectar Teaspoon Delayed swallow initiation-vallecula;Reduced anterior laryngeal mobility;Reduced laryngeal elevation;Reduced tongue base retraction;Pharyngeal residue - valleculae Pharyngeal Material does not enter airway Pharyngeal- Nectar Cup Delayed swallow initiation-vallecula;Reduced anterior laryngeal mobility;Reduced laryngeal elevation;Reduced tongue base retraction;Pharyngeal residue - valleculae Pharyngeal -- Pharyngeal- Nectar Straw Delayed swallow initiation-vallecula;Reduced anterior laryngeal mobility;Reduced laryngeal elevation;Reduced tongue base retraction;Pharyngeal residue - valleculae Pharyngeal -- Pharyngeal- Thin Teaspoon Reduced anterior laryngeal mobility;Reduced laryngeal elevation;Reduced tongue base retraction;Pharyngeal residue - valleculae;Delayed swallow initiation-pyriform sinuses;Penetration/Aspiration before swallow Pharyngeal Material enters airway, remains ABOVE vocal cords and not ejected out Pharyngeal- Thin Cup -- Pharyngeal -- Pharyngeal- Thin Straw -- Pharyngeal -- Pharyngeal- Puree Delayed swallow initiation-vallecula;Reduced anterior laryngeal mobility;Reduced laryngeal elevation;Reduced tongue base retraction;Pharyngeal residue - valleculae Pharyngeal -- Pharyngeal- Mechanical Soft Delayed swallow initiation-vallecula;Reduced anterior laryngeal mobility;Reduced laryngeal elevation;Reduced tongue base retraction;Pharyngeal residue - valleculae Pharyngeal --  Pharyngeal- Regular -- Pharyngeal -- Pharyngeal- Multi-consistency -- Pharyngeal -- Pharyngeal- Pill -- Pharyngeal -- Pharyngeal Comment --  CHL IP CERVICAL ESOPHAGEAL PHASE 01/12/2016 Cervical Esophageal Phase WFL Pudding Teaspoon -- Pudding Cup -- Honey Teaspoon -- Honey Cup -- Nectar Teaspoon -- Nectar Cup -- Nectar Straw -- Thin Teaspoon -- Thin Cup -- Thin Straw -- Puree -- Mechanical Soft -- Regular -- Multi-consistency -- Pill -- Cervical Esophageal Comment -- No flowsheet data found. Germain Osgood, M.A. CCC-SLP [phone] Germain Osgood 01/12/2016, 1:26 PM                Scheduled Meds: . antiseptic oral rinse  7 mL Mouth Rinse q12n4p  . chlorhexidine  15 mL Mouth Rinse BID  . diltiazem  30 mg Oral Q6H  . insulin aspart  0-15 Units Subcutaneous TID WC  . insulin aspart  0-5 Units Subcutaneous QHS  . metoprolol tartrate  25 mg Oral BID  . [START ON 01/14/2016] pantoprazole  40 mg Oral Daily  . potassium chloride  40 mEq Oral Once   Continuous Infusions: . feeding supplement (VITAL AF 1.2 CAL) 1,000 mL (01/12/16 1044)    Marzetta Board, MD, PhD [facility] Pager [phone]  If 7PM-7AM, please contact night-coverage [email or web address] Password [email or web address] 01/13/2016, 2:48 PM

## 2016-01-13 NOTE — Progress Notes (Signed)
Bladder scanned patient for >999, assisted patient up to bsc, voided 136ml, pvr >999, Straight cathed patient for 100. Spoke to Dr Renne Crigler about voiding issue and wondering if bladder scan is picking up the post abd bleed,He looked at CT scan and agrees with the bladder scan is picking up the post abd bleed. He stated to just keep watch on her voiding at this time

## 2016-01-13 NOTE — Progress Notes (Signed)
Physical Therapy Treatment Patient Details Name: Bonnie Salazar MRN: [id number] DOB: Jun 14, 1941 Today's Date: 01/13/2016    History of Present Illness 75 y.o. female admitted to [facility] on 12/25/15 s/p fall with resultant L hip fx s/p L direct anterior THA, WBAT. Pt was extubated in the PACU. Shortly thereafter, she complained of abdominal pain and was noted to have a tense abdomen. CT scan showed extravasation into the LLQ; she was taken to IR and underwent covered stent of the distal external iliac artery to the inguinal ligament. Vascular surgery was also consulted. No signs of distal ischemia on his exam. There is some concern for abdominal compartment syndrome, as well. MTP was initiated and she received 11u pRBC, 7FFP, 2 PLT pheresis, 2 cryoprecipitate. Returned to ICU on mechanical ventilation. She suffered a PEA arrest 6/18. ICU course complicated by further bleeding. IVC filter placed. Intubated 6/16-6/17/17 and 6/18-6/23/17. Pt with significant PMHx of back surgery listed in chart. bil PE found on CT 6/17.     PT Comments    Pt performed increased standing and able to advance short gait distance from bed to chair.  Pt fatigued with mobility but required additional standing trial to clean patient of bowel and bladder incontinence.  Noticeable DOE during tx with poor activity tolerance.  Will continue to follow patient to advance mobility in preparation for next level of care.     Follow Up Recommendations  CIR     Equipment Recommendations  Rolling walker with 5" wheels;3in1 (PT)    Recommendations for Other Services Rehab consult     Precautions / Restrictions Precautions Precautions: Fall Restrictions Weight Bearing Restrictions: Yes LLE Weight Bearing: Weight bearing as tolerated    Mobility  Bed Mobility Overal bed mobility: Needs Assistance Bed Mobility: Supine to Sit     Supine to sit: Mod assist     General bed mobility comments: Able to initiate movement of  Bil LEs but requires assist to get them to EOB, assist to elevate trunk and to scoot to EOB. Pt sat edge of bed x 2 min with cues for upper trunk control and scapular retraction.    Transfers Overall transfer level: Needs assistance Equipment used: Rolling walker (2 wheeled) Transfers: Sit to/from Stand Sit to Stand: Max assist Stand pivot transfers: Max assist       General transfer comment: Assist to boost and steady w/ cues for hand placement and technique.  Pt able to correct posterior bias and flexed posture this session w/ verbal and tactile cues.  PTA provided cues for hip extension and upper trunk extension to improve posture.  Pt presents with bowel and bladder incontinence on standing and required additonal standing trial of ~3 min for perianal care.  Pt educated to call for staff when needing to use restroom to avoid skin breakdown.  Nurse applied barrier cream during session.    Ambulation/Gait Ambulation/Gait assistance: Max assist Ambulation Distance (Feet): 6 Feet Assistive device: Rolling walker (2 wheeled) Gait Pattern/deviations: Decreased step length - right;Decreased stride length;Decreased step length - left;Shuffle;Trunk flexed;Antalgic Gait velocity: very slow.   Gait velocity interpretation: Below normal speed for age/gender General Gait Details: Pt shuffles feet and requires assist to steadyPt remains to fatigue quickly.  Pt required increased assist to position walker with max VCs for step sequencing.  Pt flexing over RW and requiring cues to correct posture for safety.     Stairs            Emergency planning/management officer  Modified Rankin (Stroke Patients Only)       Balance                                    Cognition Arousal/Alertness: Awake/alert Behavior During Therapy: Flat affect;Anxious Overall Cognitive Status: Impaired/Different from baseline Area of Impairment: Attention   Current Attention Level: Sustained Memory: Decreased  short-term memory Following Commands: Follows one step commands with increased time     Problem Solving: Decreased initiation      Exercises Total Joint Exercises Ankle Circles/Pumps: AROM;Both;Supine;10 reps Quad Sets: AROM;Left;5 reps;Supine Heel Slides: AAROM;Left;10 reps;Supine Hip ABduction/ADduction: AAROM;Left;10 reps;Supine    General Comments        Pertinent Vitals/Pain Pain Assessment: 0-10 Pain Score: 6  Pain Location: L hip  Pain Descriptors / Indicators: Discomfort;Grimacing;Guarding Pain Intervention(s): Monitored during session;Repositioned    Home Living                      Prior Function            PT Goals (current goals can now be found in the care plan section) Acute Rehab PT Goals Patient Stated Goal: none stated Potential to Achieve Goals: Fair Progress towards PT goals: Progressing toward goals    Frequency  Min 5X/week    PT Plan Current plan remains appropriate    Co-evaluation             End of Session Equipment Utilized During Treatment: Gait belt Activity Tolerance: Patient limited by fatigue Patient left: with call bell/phone within reach;with family/visitor present;in bed     Time: [id number] PT Time Calculation (min) (ACUTE ONLY): 34 min  Charges:  $Therapeutic Exercise: 8-22 mins $Therapeutic Activity: 8-22 mins                    G Codes:      Cristela Blue 02-11-2016, 5:20 PM  Governor Rooks, PTA pager [phone] Governor Rooks, PTA pager [phone]

## 2016-01-13 NOTE — Progress Notes (Signed)
I had a lengthy bedside visit with the patient and her husband earlier today.  We discussed the recommendation for IP Rehab .  I answered their questions.  Pt. And her husband are both very concerned over several areas including pt's readiness for IP Rehab and her ability to tolerate.  Another concern was that pt. would be moved to CIR without her MD giving approval. I assured pt. and her husband that pt. could not be transitioned to CIR without express orders from the attending.  I also advised them that insurance approval from her [facility] will be necessary for admittance.  I advised pt. and husband that her therapy sessions could be spaced out to allow for rest time in between sessions.  I will follow up tomorrow to see how pt. progressed in/tolerated her therapy session following my visit today.  Pt. stated she would like me to initiate insurance authorization process.  I will initiate today if [place] Medicare is open today, or tomorrow otherwise.  Please call if questions.  [place] Admissions Coordinator Cell [phone] Office [phone]

## 2016-01-14 LAB — CBC WITH DIFFERENTIAL/PLATELET
BASOS ABS: 0 10*3/uL (ref 0.0–0.1)
BASOS PCT: 0 %
EOS ABS: 0.1 10*3/uL (ref 0.0–0.7)
EOS PCT: 1 %
HCT: 30.3 % — ABNORMAL LOW (ref 36.0–46.0)
Hemoglobin: 9.6 g/dL — ABNORMAL LOW (ref 12.0–15.0)
Lymphocytes Relative: 11 %
Lymphs Abs: 1.1 10*3/uL (ref 0.7–4.0)
MCH: 31.1 pg (ref 26.0–34.0)
MCHC: 31.7 g/dL (ref 30.0–36.0)
MCV: 98.1 fL (ref 78.0–100.0)
MONO ABS: 0.7 10*3/uL (ref 0.1–1.0)
Monocytes Relative: 7 %
Neutro Abs: 7.8 10*3/uL — ABNORMAL HIGH (ref 1.7–7.7)
Neutrophils Relative %: 81 %
PLATELETS: 297 10*3/uL (ref 150–400)
RBC: 3.09 MIL/uL — ABNORMAL LOW (ref 3.87–5.11)
RDW: 18.2 % — AB (ref 11.5–15.5)
WBC: 9.6 10*3/uL (ref 4.0–10.5)

## 2016-01-14 LAB — COMPREHENSIVE METABOLIC PANEL
ALBUMIN: 2.3 g/dL — AB (ref 3.5–5.0)
ALT: 69 U/L — ABNORMAL HIGH (ref 14–54)
ANION GAP: 7 (ref 5–15)
AST: 55 U/L — AB (ref 15–41)
Alkaline Phosphatase: 80 U/L (ref 38–126)
BUN: 15 mg/dL (ref 6–20)
CHLORIDE: 108 mmol/L (ref 101–111)
CO2: 23 mmol/L (ref 22–32)
Calcium: 8.9 mg/dL (ref 8.9–10.3)
Creatinine, Ser: 0.49 mg/dL (ref 0.44–1.00)
GFR calc Af Amer: >60 mL/min (ref >60)
GFR calc non Af Amer: >60 mL/min (ref >60)
GLUCOSE: 114 mg/dL — AB (ref 65–99)
POTASSIUM: 3.7 mmol/L (ref 3.5–5.1)
Sodium: 138 mmol/L (ref 135–145)
Total Bilirubin: 0.9 mg/dL (ref 0.3–1.2)
Total Protein: 4.9 g/dL — ABNORMAL LOW (ref 6.5–8.1)

## 2016-01-14 LAB — MAGNESIUM: MAGNESIUM: 2.3 mg/dL (ref 1.7–2.4)

## 2016-01-14 LAB — GLUCOSE, CAPILLARY
GLUCOSE-CAPILLARY: 116 mg/dL — AB (ref 65–99)
Glucose-Capillary: 132 mg/dL — ABNORMAL HIGH (ref 65–99)
Glucose-Capillary: 106 mg/dL — ABNORMAL HIGH (ref 65–99)
Glucose-Capillary: 114 mg/dL — ABNORMAL HIGH (ref 65–99)

## 2016-01-14 MED ORDER — ENSURE ENLIVE PO LIQD
237.0000 mL | Freq: Three times a day (TID) | ORAL | Status: DC
  Administered 2016-01-14 – 2016-01-16 (×5): 237 mL via ORAL

## 2016-01-14 MED ORDER — HYDROCODONE-ACETAMINOPHEN 5-325 MG PO TABS
2.0000 | ORAL_TABLET | Freq: Once | ORAL | Status: AC
  Administered 2016-01-14: 2 via ORAL
  Filled 2016-01-14: qty 2

## 2016-01-14 MED ORDER — METHOCARBAMOL 750 MG PO TABS
750.0000 mg | ORAL_TABLET | Freq: Four times a day (QID) | ORAL | Status: DC | PRN
  Administered 2016-01-14: 750 mg via ORAL
  Filled 2016-01-14: qty 1

## 2016-01-14 MED ORDER — MORPHINE SULFATE (PF) 2 MG/ML IV SOLN: 2.0000 mg | Freq: Once | INTRAVENOUS | Status: DC

## 2016-01-14 MED ORDER — HYDROCODONE-ACETAMINOPHEN 5-325 MG PO TABS
1.0000 | ORAL_TABLET | ORAL | Status: DC | PRN
  Administered 2016-01-14 – 2016-01-15 (×3): 1 via ORAL
  Filled 2016-01-14 (×4): qty 1

## 2016-01-14 NOTE — PMR Pre-admission (Signed)
PMR Admission Coordinator Pre-Admission Assessment  Patient: Bonnie Salazar is an 75 y.o., female MRN: [id number] DOB: [birth year] Height: 5\' 7"  (170.2 cm) Weight: 63.4 kg (139 lb 12.4 oz)              Insurance Information HMO:     PPO:  yes     PCP:      IPA:      80/20:      OTHER:  PRIMARY:       Policy#: [id number]      Subscriber:  self CM Name: Maudie Mercury     Phone#: [phone]     Fax#: [phone] Pre-Cert#: [id number] for 13 days 01/16/16-01/28/16 with faxed updates due 01/26/16     Employer:  retired Benefits:  Phone #:  [phone]     Name:  Lavell Islam. Date:  07/13/15     Deduct:  $0      Out of Pocket Max:  $4950      Life Max:  unlimited CIR:  $318 per day, days 1-5 with $1590 maximum      SNF:  $0 days 1-20; days 21-100 $164 daily Outpatient:  $40 copay      Home Health:    100%      Co-Pay DME:  80%/20%     Providers:    In network SECONDARY:       Policy#:       Subscriber:  CM Name:       Phone#:      Fax#:  Pre-Cert#:       Employer:  Benefits:  Phone #:      Name:  Eff. Date:      Deduct:       Out of Pocket Max:       Life Max:  CIR:       SNF:  Outpatient:      Co-Pay:  Home Health:       Co-Pay:  DME:      Co-Pay:   Medicaid Application Date:       Case Manager:  Disability Application Date:       Case Worker:   Emergency Facilities manager Information    Name Relation Home Work Mallard Spouse [phone]  [phone]   No name specified         Current Medical History  Patient Admitting Diagnosis: Functional, mobility, and swallowing/communication deficits secondary to left femoral neck fracture and subsequent complications History of Present Illness: Bonnie Salazar is a 75 y.o. female admitted on 12/25/15 after a fall with subsequent left femoral neck fracture. She underwent left anterior hip arthroplasty by Dr. Fredonia Highland. Post op course complicated by abdominal distension with hypotension due to bleeding from distal iliac  artery. She underwent distal external iliac artery stent down to inguinal ligament. She was treated with 11u PRBC, 7 units FFP, 2 units PLT pheresis and cryoprecipitate for life threatening bleed. Dr. Hulen Skains consulted and recommended monitoring. She tolerated extubation on 06/17 and ortho recommended WBAT with ASA for DVT prophylaxis. BLE dopplers 6/17 were negative for DVT. 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 She developed respiratory distress with marked tachycardia on 06/18 requiring face mask. CTA chest done showing bilateral PE with completely occlusive thrombus in LLL PA with nearly occlusive segmental pulmonary arteries and right heart strain. Dr. Cyndia Bent consulted and following for input. She  underwent catheter lysis of intra-atrial clot by Dr. Earleen Newport. Repeat echo 06/19 revealed that RA thrombus no longer present.   Post procedure developed leg, back and abdominal pain with hypotension requiring neo and was transfused with 2 units PRBC. She was sedated and intubated for airway protection. She developed PEA cardiac arrest later that evening requiring 10 minutes of resuscitation. CTA abdomen/pelvis showed decrease in extraperitoneal bleed, compression of left external iliac and comon iliac veins by pelvic hematoma, no new pelvic hematoma and enlarging hematoma proximal left thigh. CT head without acute changes. Heparin was discontinued and IVC filter placed on 06/21. Has been treated with antibiotics for fevers as well as IV diuresis for fluid overload. She tolerated extubation on 06/23 and continues to be NPO due to high aspiration risk. MBS done yesterday revealing oropharyngeal dysphagia with delay in swallow, moderate residue, fatigue and aspiration. She developed bloody stools on 06/26 and GI consulted for input. Dr. Silverio Decamp questioned ischemic colitis v/s hemorrhoids as cause of bleeding  and supportive care recommended. She has had intermittent rectal bleeding felt to be due to hems. H/H has been relatively stable and leucocytosis has resolved. Repeat swallow evaluation done on 7/03 and she was started on dysphagia 1, nectar liquids. Tube feeds discontinued with resolution of hypernatremia and diarrhea. Patient continue to have DOE but mobility improving and patient remains motivated. CIR recommended for follow up therapy and patient cleared medically for intensive rehab program.       Past Medical History  Past Medical History  Diagnosis Date  . Acute pulmonary embolism ([place]) 12/28/2015  . Cardiac arrest ([place]) 12/2015  . Fracture of femoral neck, left ([place]) 01/2016  . Acute respiratory failure with hypoxia ([place]) 01/2016  . Arterial hemorrhage 01/2016  . Atrial mass 01/2016  . Female pelvic hematoma 01/2016  . Hypernatremia 01/2016  . 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  Prior Rehab/Hospitalizations:  Has the patient had major surgery during 100 days prior to admission? No  Current Medications   Current facility-administered medications:  .  acetaminophen (TYLENOL) solution 650 mg, 650 mg, Per Tube, Q6H PRN, Anders Simmonds, MD, 650 mg at 01/07/16 2345 .  acetaminophen (TYLENOL) tablet 650 mg, 650 mg, Oral, Q6H PRN, Albertine Patricia, MD, 650 mg at 01/15/16 1926 .  antiseptic oral rinse (CPC / CETYLPYRIDINIUM CHLORIDE 0.05%) solution 7 mL, 7 mL, Mouth Rinse, q12n4p, Allie Bossier, MD, 7 mL at 01/15/16 1207 .  chlorhexidine (PERIDEX) 0.12 % solution 15 mL, 15 mL, Mouth Rinse, BID, Allie Bossier, MD, 15 mL at 01/16/16 0807 .  diltiazem (CARDIZEM) tablet 30 mg, 30 mg, Oral, Q6H, Allie Bossier, MD, 30 mg at 01/16/16 1003 .  docusate (COLACE) 50 MG/5ML liquid 100 mg, 100 mg, Per Tube, BID PRN, Donita Brooks, NP .  feeding supplement  (ENSURE ENLIVE) (ENSURE ENLIVE) liquid 237 mL, 237 mL, Oral, TID BM, Silver Huguenin Elgergawy, MD, 237 mL at 01/16/16 1000 .  hydrALAZINE (APRESOLINE) injection 10 mg, 10 mg, Intravenous, Q4H PRN, Anders Simmonds, MD .  HYDROcodone-acetaminophen (NORCO/VICODIN) 5-325 MG per tablet 1 tablet, 1 tablet, Oral, Daily PRN, Albertine Patricia, MD .  insulin aspart (novoLOG) injection 0-15 Units, 0-15 Units, Subcutaneous, TID WC, Costin Karlyne Greenspan, MD, 2 Units at 01/16/16 [phone] .  insulin aspart (novoLOG) injection 0-5 Units, 0-5 Units, Subcutaneous, QHS, Costin M  Cruzita Lederer, MD, 0 Units at 01/13/16 2114 .  loperamide (IMODIUM) 1 MG/5ML solution 1 mg, 1 mg, Per Tube, PRN, Allie Bossier, MD, 1 mg at 01/12/16 0044 .  [DISCONTINUED] metoCLOPramide (REGLAN) tablet 5-10 mg, 5-10 mg, Oral, Q8H PRN **OR** metoCLOPramide (REGLAN) injection 5-10 mg, 5-10 mg, Intravenous, Q8H PRN, Geradine Girt, DO .  metoprolol (LOPRESSOR) injection 2.5-5 mg, 2.5-5 mg, Intravenous, Q3H PRN, Donita Brooks, NP, 5 mg at 01/03/16 1125 .  metoprolol tartrate (LOPRESSOR) tablet 25 mg, 25 mg, Oral, BID, Charlynne Cousins Gribbin, PA-C, 25 mg at 01/16/16 1004 .  ondansetron (ZOFRAN) tablet 4 mg, 4 mg, Oral, Q6H PRN **OR** ondansetron (ZOFRAN) injection 4 mg, 4 mg, Intravenous, Q6H PRN, Geradine Girt, DO .  pantoprazole (PROTONIX) EC tablet 40 mg, 40 mg, Oral, Daily, Vena Rua, PA-C, 40 mg at 01/16/16 0807 .  RESOURCE THICKENUP CLEAR, , Oral, PRN, Costin Karlyne Greenspan, MD  Patients Current Diet: DIET - DYS 1 Room service appropriate?: Yes with Assist; Fluid consistency:: Thin  Precautions / Restrictions Precautions Precautions: Fall Restrictions Weight Bearing Restrictions: Yes LLE Weight Bearing: Weight bearing as tolerated   Has the patient had 2 or more falls or a fall with injury in the past year?No  Prior Activity Level Community (5-7x/wk): Pt. was very active PTA.  Husband reports pt. would drive to [facility] daily and take a 1 1/2  miile walk.  Pt. states she enjoys reading and watching dancing programs onTV.  Home Assistive Devices / Equipment    Prior Device Use: Indicate devices/aids used by the patient prior to current illness, exacerbation or injury? None of the above  Prior Functional Level Prior Function Level of Independence: Independent Comments: per pt she did not use a RW or cane PTA  Self Care: Did the patient need help bathing, dressing, using the toilet or eating?  Independent  Indoor Mobility: Did the patient need assistance with walking from room to room (with or without device)? Independent  Stairs: Did the patient need assistance with internal or external stairs (with or without device)? Independent  Functional Cognition: Did the patient need help planning regular tasks such as shopping or remembering to take medications? Independent  Current Functional Level Cognition  Overall Cognitive Status: Within Functional Limits for tasks assessed Current Attention Level: Sustained Orientation Level: Oriented X4 Following Commands: Follows one step commands with increased time General Comments: Appears to require significant effort to perform minimal motor movement/follow commands     Extremity Assessment (includes Sensation/Coordination)  Upper Extremity Assessment: RUE deficits/detail, LUE deficits/detail RUE Deficits / Details: grossly 2-/5 RUE Coordination: decreased fine motor, decreased gross motor LUE Deficits / Details: grossly 2-/5 LUE Coordination: decreased fine motor, decreased gross motor  Lower Extremity Assessment: Defer to PT evaluation    ADLs  Overall ADL's : Needs assistance/impaired Eating/Feeding: NPO Grooming: Wash/dry hands, Wash/dry face, Standing, Min guard Grooming Details (indicate cue type and reason): pt seated for hair washing by therapist. Pt encouraged to participate, however she declined multiple times Upper Body Bathing: Total assistance, Bed level,  Sitting Lower Body Bathing: Total assistance, Sit to/from stand Upper Body Dressing : Sitting, Minimal assistance Lower Body Dressing: Total assistance, Bed level, Sit to/from stand Toilet Transfer: RW, +2 for safety/equipment, Comfort height toilet, Ambulation, Moderate assistance Toileting- Clothing Manipulation and Hygiene: Moderate assistance, Sit to/from stand Functional mobility during ADLs: Moderate assistance, +2 for safety/equipment General ADL Comments: refused to stand a second time for [facility] T/F  Mobility  Overal bed mobility: Needs Assistance Bed Mobility: Supine to Sit Supine to sit: Mod assist Sit to supine: Max assist General bed mobility comments: Pt able to reach for railing but required mod assist for LE advancement and max assist for scooting to edge of bed.  Assist required as well for trunk elevation.      Transfers  Overall transfer level: Needs assistance Equipment used: Rolling walker (2 wheeled) Transfers: Sit to/from Stand Sit to Stand: Mod assist, Min assist (Assist level varried required mod from bed and min from higher seated BSC.  ) Stand pivot transfers: Mod assist General transfer comment: Required assist to boost into standing performed standing from bed and BSC.  Pt required cues for hand placement, forward weight shifting and transition of hand from seated surface to RW grips.  demonstrates poor hand placement and eccentric loading during stand to sit.      Ambulation / Gait / Stairs / Wheelchair Mobility  Ambulation/Gait Ambulation/Gait assistance: Mod assist Ambulation Distance (Feet): 18 Feet Assistive device: Rolling walker (2 wheeled) Gait Pattern/deviations: Step-to pattern, Antalgic, Decreased stride length, Shuffle General Gait Details: Pt remains to fatigue quickly and requires encouragement to advance gait distance.  Pt required assist to position walker with max VCs for step sequencing.  Pt flexing over RW and requiring cues to correct  posture for safety.  Pt presents with improved gait speed and able to advance gait distance this tx.  Close chair follow provided.     Gait velocity: very slow.   Gait velocity interpretation: Below normal speed for age/gender    Posture / Balance Dynamic Sitting Balance Sitting balance - Comments: Pt able to sit EOB ~5 minutes w/ UE supported on bed w/ close min guard assist Balance Overall balance assessment: Needs assistance Sitting-balance support: No upper extremity supported Sitting balance-Leahy Scale: Fair Sitting balance - Comments: Pt able to sit EOB ~5 minutes w/ UE supported on bed w/ close min guard assist Postural control: Posterior lean Standing balance support: Bilateral upper extremity supported, During functional activity Standing balance-Leahy Scale: Poor Standing balance comment: Relies on UE support from RW and additional physical assist    Special needs/care consideration BiPAP/CPAP   no CPM   no Continuous Drip IV Dialysis   no        Days    Life Vest   no Oxygen   no Special Bed   no Trach Size   no Wound Vac (area)  no      Location Skin   Appropriate, dry                           Bowel mgmt  Small 01/16/16 Bladder mgmt  Incontinent  Diabetic mgmt  yes     Previous Home Environment Living Arrangements: Spouse/significant other (retired) Available Help at Discharge: Family, Available 24 hours/day Home Care Services: No Additional Comments: Difficult hx due to low tone of voice and slow processing.  Discharge Living Setting Plans for Discharge Living Setting: Patient's home Type of Home at Discharge: House Discharge Home Layout: Two level, Able to live on main level with bedroom/bathroom Discharge Home Access: Level entry Discharge Bathroom Shower/Tub: Tub/shower unit, Walk-in shower Discharge Bathroom Toilet: Standard Discharge Bathroom Accessibility: Yes How Accessible: Accessible via walker Does the patient have any problems obtaining your  medications?: No  Social/Family/Support Systems Patient Roles: Spouse, Parent, Other (Comment) (grandparent) Anticipated Caregiver: husband, Kaylyn Milkey Anticipated Caregiver's Contact Information: [phone] Ability/Limitations  of Caregiver: no limitations Caregiver Availability: 24/7 Discharge Plan Discussed with Primary Caregiver: Yes Is Caregiver In Agreement with Plan?: Yes Does Caregiver/Family have Issues with Lodging/Transportation while Pt is in Rehab?: No  Goals/Additional Needs Patient/Family Goal for Rehab: supervision and min assist PT/OT; mod I and supervision SLP Expected length of stay: 18-24 days Cultural Considerations: "[employer]" Dietary Needs: dysphagia 1, nectar thick liquids Equipment Needs: TBA Pt/Family Agrees to Admission and willing to participate: Yes Program Orientation Provided & Reviewed with Pt/Caregiver Including Roles  & Responsibilities: Yes  Decrease burden of Care through IP rehab admission:  no  Possible need for SNF placement upon discharge:    Not anticipated  Patient Condition: This patient's medical and functional status has changed since the consult dated: 01/08/16 in which the Rehabilitation Physician determined and documented that the patient's condition is appropriate for intensive rehabilitative care in an inpatient rehabilitation facility. See "History of Present Illness" (above) for medical update. Functional changes are: Mod assist transfers and gait with rolling walker for 18 feet.  Patient's medical and functional status update has been discussed with the Rehabilitation physician and patient remains appropriate for inpatient rehabilitation. Will admit to inpatient rehab today.  Preadmission Screen Completed By:  Gunnar Fusi, 01/16/2016 11:22 AM ______________________________________________________________________   Discussed status with Dr. Posey Pronto on 01/16/16 at 1125 and received telephone approval for admission today.  Admission  Coordinator:  Gunnar Fusi, time1125/Date 01/16/16

## 2016-01-14 NOTE — Progress Notes (Signed)
Occupational Therapy Treatment Patient Details Name: Bonnie Salazar MRN: [id number] DOB: 05/12/1941 Today's Date: 01/14/2016    History of present illness 75 y.o. female admitted to [facility] on 12/25/15 s/p fall with resultant L hip fx s/p L direct anterior THA, WBAT. Pt was extubated in the PACU. Shortly thereafter, she complained of abdominal pain and was noted to have a tense abdomen. CT scan showed extravasation into the LLQ; she was taken to IR and underwent covered stent of the distal external iliac artery to the inguinal ligament. Vascular surgery was also consulted. No signs of distal ischemia on his exam. There is some concern for abdominal compartment syndrome, as well. MTP was initiated and she received 11u pRBC, 7FFP, 2 PLT pheresis, 2 cryoprecipitate. Returned to ICU on mechanical ventilation. She suffered a PEA arrest 6/18. ICU course complicated by further bleeding. IVC filter placed. Intubated 6/16-6/17/17 and 6/18-6/23/17. Pt with significant PMHx of back surgery listed in chart. bil PE found on CT 6/17.    OT comments  Pt making progress with functional goals. Pt required decreased physical assist with bed mobility and transfers. Pt ambulated from bedside to bathroom to use toilet this session. Pt tolerated sitting for washing hair by therapist. Pt was encouraged to participate in her hair care, however she declined multiple times. Reiterated to pt what would be expected on CIR and that she must participate with acute level OT and PT as much as possible and with as much effort as possible. Pt in better spiriys today. OT will continue to follow acutely  Follow Up Recommendations  CIR;Supervision/Assistance - 24 hour (pt not demonstarting that she can tolerate SNF, but is improving)    Equipment Recommendations  3 in 1 bedside comode;Tub/shower bench    Recommendations for Other Services      Precautions / Restrictions Precautions Precautions: Fall Restrictions Weight Bearing  Restrictions: Yes LLE Weight Bearing: Weight bearing as tolerated       Mobility Bed Mobility Overal bed mobility: Needs Assistance Bed Mobility: Supine to Sit     Supine to sit: Min assist     General bed mobility comments: Able to initiate movement of Bil LEs and elevate trunk using UEs to pull up but requires assist to to shift hips over and to scoot to EOB using pads   Transfers Overall transfer level: Needs assistance Equipment used: Rolling walker (2 wheeled) Transfers: Sit to/from Stand Sit to Stand: Mod assist              Balance Overall balance assessment: Needs assistance Sitting-balance support: No upper extremity supported;Feet supported Sitting balance-Leahy Scale: Fair     Standing balance support: Bilateral upper extremity supported;During functional activity Standing balance-Leahy Scale: Poor                     ADL       Grooming: Wash/dry hands;Wash/dry face;Standing;Min guard Grooming Details (indicate cue type and reason): pt seated for hair washing by therapist. Pt encouraged to participate, however she declined multiple times         Upper Body Dressing : Sitting;Minimal assistance       Toilet Transfer: RW;+2 for safety/equipment;Comfort height toilet;Ambulation;Moderate assistance   Toileting- Clothing Manipulation and Hygiene: Moderate assistance;Sit to/from stand       Functional mobility during ADLs: Moderate assistance;+2 for safety/equipment        Vision  no change from baseline  Cognition   Behavior During Therapy: WFL for tasks assessed/performed Overall Cognitive Status: Within Functional Limits for tasks assessed                       Extremity/Trunk Assessment   generalized weakness                        General Comments  pt pleasant and more cooperative this session    Pertinent Vitals/ Pain       Pain Assessment: Faces Pain Score: 3   Pain Location: back Pain Descriptors / Indicators: Sore;Discomfort Pain Intervention(s): Monitored during session;Repositioned  Home Living  lives at home with husband                                        Prior Functioning/Environment  independent, active           Frequency Min 2X/week     Progress Toward Goals  OT Goals(current goals can now be found in the care plan section)  Progress towards OT goals: Progressing toward goals     Plan Frequency remains appropriate    Co-evaluation    PT/OT/SLP Co-Evaluation/Treatment: Yes Reason for Co-Treatment: Complexity of the patient's impairments (multi-system involvement);For patient/therapist safety   OT goals addressed during session: ADL's and self-care;Proper use of Adaptive equipment and DME      End of Session Equipment Utilized During Treatment: Rolling walker;Gait belt;Other (comment) (3 in 1)   Activity Tolerance Patient limited by fatigue   Patient Left with call bell/phone within reach;with family/visitor present;in chair             Time: [id number] OT Time Calculation (min): 39 min  Charges: OT General Charges $OT Visit: 1 Procedure OT Treatments $Self Care/Home Management : 8-22 mins $Therapeutic Activity: 8-22 mins  Britt Bottom 01/14/2016, 12:59 PM

## 2016-01-14 NOTE — Progress Notes (Signed)
Physical Therapy Treatment Patient Details Name: Bonnie Salazar MRN: [id number] DOB: 1941-06-11 Today's Date: 01/14/2016    History of Present Illness 75 y.o. female admitted to [facility] on 12/25/15 s/p fall with resultant L hip fx s/p L direct anterior THA, WBAT. Pt was extubated in the PACU. Shortly thereafter, she complained of abdominal pain and was noted to have a tense abdomen. CT scan showed extravasation into the LLQ; she was taken to IR and underwent covered stent of the distal external iliac artery to the inguinal ligament. Vascular surgery was also consulted. No signs of distal ischemia on his exam. There is some concern for abdominal compartment syndrome, as well. MTP was initiated and she received 11u pRBC, 7FFP, 2 PLT pheresis, 2 cryoprecipitate. Returned to ICU on mechanical ventilation. She suffered a PEA arrest 6/18. ICU course complicated by further bleeding. IVC filter placed. Intubated 6/16-6/17/17 and 6/18-6/23/17. Pt with significant PMHx of back surgery listed in chart. bil PE found on CT 6/17.     PT Comments    Pt performed increased mobility and advanced gait distance.  Pt remains to fatigue but more motivated this session.    Follow Up Recommendations  CIR     Equipment Recommendations  Rolling walker with 5" wheels;3in1 (PT)    Recommendations for Other Services Rehab consult     Precautions / Restrictions Precautions Precautions: Fall Restrictions Weight Bearing Restrictions: Yes LLE Weight Bearing: Weight bearing as tolerated    Mobility  Bed Mobility Overal bed mobility: Needs Assistance Bed Mobility: Supine to Sit     Supine to sit: Min assist     General bed mobility comments: Able to initiate movement of Bil LEs and elevate trunk using UEs to pull up but requires assist to to shift hips over and to scoot to EOB using pads   Transfers Overall transfer level: Needs assistance Equipment used: Rolling walker (2 wheeled) Transfers: Sit to/from  Stand Sit to Stand: Mod assist         General transfer comment: Assist to boost and steady w/ cues for hand placement and technique.  Pt able to correct posterior bias and flexed posture this session w/ verbal and tactile cues.  PTA provided cues for hip extension and upper trunk extension to improve posture.  Pt performed sit to stand from bed, BSC and chair.  Cues for hand placement required and pt tends to forget and reach for RW.  Poor eccentric loading  during stand to sit.    Ambulation/Gait Ambulation/Gait assistance: Mod assist Ambulation Distance (Feet): 16 Feet (+6 ft + 8 ft.  ) Assistive device: Rolling walker (2 wheeled) Gait Pattern/deviations: Step-to pattern;Antalgic;Trunk flexed;Shuffle;Decreased stride length Gait velocity: very slow.     General Gait Details: Pt shuffles feet and requires assist to steadyPt remains to fatigue quickly.  Pt required increased assist to position walker with max VCs for step sequencing.  Pt flexing over RW and requiring cues to correct posture for safety.     Stairs            Wheelchair Mobility    Modified Rankin (Stroke Patients Only)       Balance Overall balance assessment: Needs assistance Sitting-balance support: No upper extremity supported Sitting balance-Leahy Scale: Fair     Standing balance support: Bilateral upper extremity supported;During functional activity Standing balance-Leahy Scale: Poor                      Cognition Arousal/Alertness: Awake/alert Behavior During Therapy:  WFL for tasks assessed/performed Overall Cognitive Status: Within Functional Limits for tasks assessed                      Exercises      General Comments        Pertinent Vitals/Pain Pain Assessment: 0-10 Pain Score: 3  Faces Pain Scale: Hurts a little bit Pain Location: back Pain Descriptors / Indicators: Sore;Discomfort Pain Intervention(s): Monitored during session;Repositioned    Home Living                       Prior Function            PT Goals (current goals can now be found in the care plan section) Acute Rehab PT Goals Patient Stated Goal: none stated Potential to Achieve Goals: Fair Progress towards PT goals: Progressing toward goals    Frequency  Min 5X/week    PT Plan Current plan remains appropriate    Co-evaluation   Reason for Co-Treatment: Complexity of the patient's impairments (multi-system involvement);For patient/therapist safety PT goals addressed during session: Mobility/safety with mobility;Proper use of DME OT goals addressed during session: ADL's and self-care;Proper use of Adaptive equipment and DME     End of Session Equipment Utilized During Treatment: Gait belt Activity Tolerance: Patient limited by fatigue Patient left: with call bell/phone within reach;with family/visitor present;in bed     Time: [id number] PT Time Calculation (min) (ACUTE ONLY): 39 min  Charges:  $Gait Training: 8-22 mins                    G Codes:      Cristela Blue 2016/01/24, 2:45 PM  Governor Rooks, PTA pager [phone]

## 2016-01-14 NOTE — Progress Notes (Signed)
She is comfortable and pain is controlled She is doing well. They are apprehensive about the move to CIR as she has not mobilized much with PT yet.  I examined her L leg incision and it is dry and benign. Distally NVI     Plan: Recommend continued PT as an inpatient through the week and look towards CIR firday/monday Ok to shower and get her incision wet, no soaking in a tub  Ok to leave left hip incision open to air, steri strips will fall off once she gets them wet.    Bonnie Salazar

## 2016-01-14 NOTE — Progress Notes (Signed)
Nutrition Follow-up  DOCUMENTATION CODES:   Not applicable  INTERVENTION:  Provide Ensure Enlive po TID (thickened to nectar thick consistency), each supplement provides 350 kcal and 20 grams of protein.  Encourage adequate PO intake.   NUTRITION DIAGNOSIS:   Inadequate oral intake related to inability to eat, dysphagia as evidenced by NPO status; diet advanced; po 25%; ongoing  GOAL:   Patient will meet greater than or equal to 90% of their needs; progressing  MONITOR:   PO intake, Supplement acceptance, Weight trends, Labs, I & O's, Diet advancement  REASON FOR ASSESSMENT:   Consult Enteral/tube feeding initiation and management  ASSESSMENT:   75 y.o. female who complains of Left hip pain s/p fall at home. No LOC. She tripped on a drawer. Pain was rated as a 10/10 at the time of injury. No CP, SOB. XRays show left femoral neck fracture.  NGT has been removed. Diet has been advanced to a dysphagia 1 diet with nectar thick liquids. Per MD note, discontinue tube feeding. RD to d/c orders. Meal completion has been 25%. Pt reports she dislikes her nectar thick liquids. Pt is agreeable to Ensure to aid in caloric and protein needs. RD to order. Pt encouraged to eat her food at meals.   Labs and medications reviewed.   Diet Order:  DIET - DYS 1 Room service appropriate?: Yes with Assist; Fluid consistency:: Nectar Thick  Skin:  Reviewed, no issues  Last BM:  7/4  Height:   Ht Readings from Last 1 Encounters:  12/27/15 5\' 7"  (1.702 m)    Weight:   Wt Readings from Last 1 Encounters:  01/14/16 143 lb 4.8 oz (65 kg)    Ideal Body Weight:  61.36 kg  BMI:  Body mass index is 22.44 kg/(m^2).  Estimated Nutritional Needs:   Kcal:  1500-1700  Protein:  85-100 gm  Fluid:  1.5-1.7 L  EDUCATION NEEDS:   No education needs identified at this time  Corrin Parker, MS, RD, LDN Pager # [phone] After hours/ weekend pager # [phone]

## 2016-01-14 NOTE — Progress Notes (Addendum)
PROGRESS NOTE  Bonnie Salazar [id number] DOB: 07/12/1941 DOA: 12/25/2015 PCP: Bing Matter, PA-C   LOS: 20 days   Brief Narrative: 75 y.o. WF without significant past medical history  She sustained an accidental fall at home. She tripped on a open drawer, causing her fall. She subsequently developed left hip pain, 10/10 in intensity with any movement. No LOC. No chest pain or shortness of breath. No swelling. No palpitations. She was taken to the OR for operative repair 6/16. The initial repair appeared uneventful and she was extubated in the PACU. Shortly thereafter, she complained of abdominal pain and was noted to have a tense abdomen. CT scan showed extravasation into the LLQ; she was taken to IR and underwent covered stent of the distal external iliac artery to the inguinal ligament. Vascular surgery was also consulted. No signs of distal ischemia on his exam. There is some concern for abdominal compartment syndrome, as well. She received total of 11u pRBC, 7 FFP, 2 PLT pheresis, 2 cryoprecipitate.Returned to ICU on mechanical ventilation.She suffered a PEA arrest 6/18. ICU course complicated by further GI bleeding.IVC filter placed.   Assessment & Plan: Principal Problem:   Fracture of femoral neck, left (HCC) Active Problems:   Hip fracture (HCC)   Leukocytosis   Dehydration   Atrial thrombus (HCC)   Acute pulmonary embolism (HCC)   Acute respiratory failure (HCC)   Hemorrhagic shock   Cardiac arrest (HCC)   Acute pulmonary embolus (HCC)   Atrial mass   Bleeding   Pulmonary embolus, left (HCC)   Pulmonary embolus, right (HCC)   Acute respiratory failure with hypoxia (HCC)   PEA (Pulseless electrical activity) (HCC)   Right atrial thrombus (HCC)   Arterial hemorrhage   Hypovolemic shock (HCC)   Hypokalemia   Hypernatremia   Gastrointestinal hemorrhage with melena   Blood per rectum   Hemorrhage   Female pelvic hematoma    Left femoral neck fracture  after accidental fall - S/P repair by orthopedic 6/16. WBAT - Out of bed to chair q shift - PT/OT evaluation recommending CIR.  Positive Acute bilateral PE-->RV strain  - due to probable propagation from right pelvic vein where might have formed in the setting of compression from hematoma - patient is s/p thrombolysis per IR   Acute Hypoxic Respiratory Failure  - Secondary to bilateral PE : Resolved - O2 sat has been >95% on RA   PEA Arrest on 6/18(~10 minutes)/Troponin elevation - Occurred Post thrombolysis  - Most likely secondary demand ischemia   Paroxysmal atrial fibrillation/severe MR - All anticoagulants on hold secondary to patient's multiple sites of bleeding. - Review chart appears heparin drip stopped on 6/21 - Sever mitral regurgitation resolved on repeat echo 6/19 - HR well controlled. Continue Cardizem 30 mg QID and switch Metoprolol 25 mg BID to PO  RA thrombus - likely propagated from right pelvic vein compression from hematoma-->migrated and causing BILATERAL PE - S/P Thrombolysis by IR - RA thrombus no longer present on repeat echo 6/19 - IVC filter in place  Arterial hemorrhage s/p stenting and requiring massive transfusion (external iliac artery) - See significant events - 6/16 transfuse to 14 units PRBC, 8 units FFP, 2 units cryoprecipitate, 2 units platelets - 6/18 transfused 2 units PRBC, 8 units FFP - 6/21 transfused 2 units PRBC - H/H stable, continue to monitor.   GI bleed  - Review chart; appears patient last received heparin on 6/21 - 6/27 Overnight patient with significant melena/diarrhea - Switch Protonix 40  mg BID to PO. H/H stable.  - GI does not believe Ischemic colitis, recommends watchful waiting. No new episodes of melena.   Hypovolemic/hemorrhagic Shock  - resolved 6/21  Hypokalemia - Repleted, continue to monitor  Hypernatremia  - Resolved, continue to monitor closely  Protein Calorie Malnutrition - Discontinue TF Vital AF  1.2 and switch to PO supplementation since patient passed swallow eval.   Dysphagia - 6/30 failed swallow study continue nothing by mouth, repeat study 7/3 showed improvement, change diet to dysphagia 1 with nectar thick liquids - Trial off tube feeds. Switch medications to PO, crushed in puree if needed. Tolerating po  Diarrhea - Most likely secondary to tube feeds. Patient afebrile. Diarrhea now almost resolved  Mild metabolic acidosis  - Possibly combination of GI loss and malnutrition - Continue to monitor     DVT prophylaxis: SCD/IVC filter Code Status: Full Family Communication: Husband at bedside Disposition Plan: CIR when medically stable.  Consultants:   Orthopedics surgery  General surgery  IR  Cardiology  Procedures:   6/16 TOTAL HIP ARTHROPLASTY;Lt ext iliac artery bleed/pelvic hematoma  6/16 CT pelvis with contrast;Active bleeding in the pelvis. Originating fm Proximal left common femoral artery/distal left external iliac artery. Bleeding near origin of the left inferior epigastric artery.-Large amount of blood and hematoma formation throughout the abdomen and pelvis.  6/16 left lower extremity angiogram; emergent covered stent embolization of distal external iliac artery branch vessel  pseudoaneurysm/avulsion, with placement of stent graft 33mm x 5cm viabahn  ECHO 6/17 >> LVEF= 50-55%; - Mitral valve: Moderate prolapse w/ severe regurgitation. - Left atrium: mildly dilated.- Right ventricle: mildly dilated. - Right atrium: large mobile mass in the right atrium that prolapses toward the mitral valve.  CT chest 6/17 >> Bilateral pulmonary emboli, nearly occlusive in the left lower lobe segmental branches, involving all of the segmental pulmonary arteries. Evidence of right heart strain with right/left heart ratio> 1. -Heterogeneous area of hypoattenuation within the right atrium may represent right atrial clot versus mixing of non-opacified blood from the  inferior vena cava. Bilateral pleural effusions and atelectatic changes of bilateral lung bases.  LE [place] 6/17 >> negative for DVT  ECHO 6/18 >> LVEF 55-60%, normal wall motion, moderate MVP, mod to severe MR, RA mildly dilated, RA with large moblie pedunculated and irregular mass that prolapses through the TV c/w thrombus  CT ABD/Pelvis 6/18 >> no evidence of new intraperitoneal or pelvic hemorrhage, increasing hematoma in left proximal thigh, decreasing volume of pulmonary embolus in the bilateral lower lobes  6/18 catheter directed TPA for right atrial thrombus  6/18; PEA arrest  CT Head 6/18 >> No CT evidence of acute intracranial abnormality  ECHO 6/19 >> LVEF 50-55%, normal wall motion, no RWMA, previous RA mass no longer demonstrated on this ECHO, mild increase in PA systolic pressure  [id number] infrarenal IVC filter placement  7/2 moved to floor  Antimicrobials:  Perioperative Ancef  6/20 Zosyn >>> 6/26  6/21 Vancomycin >> 6/23  Subjective: - feeling better this morning, complains of lower back pain,. Continues to be very weak.  Objective: Filed Vitals:   01/13/16 1600 01/13/16 2100 01/14/16 0500 01/14/16 1048  BP: 115/64 104/52 104/55 108/56  Pulse: 103 92 90 87  Temp: 99.2 F (37.3 C) 100.1 F (37.8 C) 97.5 F (36.4 C)   TempSrc: Oral Oral Oral   Resp: 16 16 17    Height:      Weight:   65 kg (143 lb 4.8 oz)  SpO2: 96% 94% 94%     Intake/Output Summary (Last 24 hours) at 01/14/16 1455 Last data filed at 01/14/16 0900  Gross per 24 hour  Intake    370 ml  Output      0 ml  Net    370 ml   Filed Weights   01/12/16 0436 01/13/16 0404 01/14/16 0500  Weight: 63.3 kg (139 lb 8.8 oz) 64.2 kg (141 lb 8.6 oz) 65 kg (143 lb 4.8 oz)    Examination: Constitutional: NAD Filed Vitals:   01/13/16 1600 01/13/16 2100 01/14/16 0500 01/14/16 1048  BP: 115/64 104/52 104/55 108/56  Pulse: 103 92 90 87  Temp: 99.2 F (37.3 C) 100.1 F (37.8 C) 97.5 F (36.4 C)    TempSrc: Oral Oral Oral   Resp: 16 16 17    Height:      Weight:   65 kg (143 lb 4.8 oz)   SpO2: 96% 94% 94%     Eyes: PERRL, lids and conjunctivae normal ENMT: Mucous membranes are dry. No oropharyngeal exudates.  Respiratory: clear to auscultation bilaterally, no wheezing, no crackles. Weak respiratory effort. No accessory muscle use.  Cardiovascular: Regular rate and rhythm, no murmurs / rubs / gallops. No LE edema.  Abdomen: no tenderness. Bowel sounds positive.  Musculoskeletal: no clubbing / cyanosis.  Neurologic: non focal    Data Reviewed: I have personally reviewed following labs and imaging studies  CBC:  Recent Labs Lab 01/10/16 0358 01/11/16 0541 01/12/16 0304 01/13/16 0340 01/14/16 0405  WBC 12.7* 12.7* 11.1* 10.7* 9.6  NEUTROABS 10.3* 10.6* 9.0* 8.4* 7.8*  HGB 9.8* 9.8* 9.5* 9.7* 9.6*  HCT 33.9* 32.6* 31.2* 32.0* 30.3*  MCV 103.0* 100.9* 99.7 99.1 98.1  PLT 289 278 287 305 [id number]   Basic Metabolic Panel:  Recent Labs Lab 01/10/16 0358 01/11/16 0541 01/12/16 0304 01/13/16 0340 01/14/16 0405  NA 153* 146* 148* 140 138  K 3.1* 3.2* 3.6 3.5 3.7  CL 124* 117* 110 112* 108  CO2 25 22 21* 23 23  GLUCOSE 145* 159* 130* 118* 114*  BUN 44* 35* 28* 24* 15  CREATININE 0.52 0.49 0.45 0.49 0.49  CALCIUM 8.9 8.7* 9.8 8.8* 8.9  MG 2.3 2.1 2.1 2.0 2.3   GFR: Estimated Creatinine Clearance: 60 mL/min (by C-G formula based on Cr of 0.49). Liver Function Tests:  Recent Labs Lab 01/08/16 0519 01/14/16 0405  AST 55* 55*  ALT 47 69*  ALKPHOS 73 80  BILITOT 1.9* 0.9  PROT 6.6 4.9*  ALBUMIN 3.4* 2.3*   No results for input(s): LIPASE, AMYLASE in the last 168 hours. No results for input(s): AMMONIA in the last 168 hours. Coagulation Profile: No results for input(s): INR, PROTIME in the last 168 hours. Cardiac Enzymes: No results for input(s): CKTOTAL, CKMB, CKMBINDEX, TROPONINI in the last 168 hours. BNP (last 3 results) No results for input(s): PROBNP in  the last 8760 hours. HbA1C: No results for input(s): HGBA1C in the last 72 hours. CBG:  Recent Labs Lab 01/13/16 1132 01/13/16 1623 01/13/16 2110 01/14/16 0626 01/14/16 1122  GLUCAP 110* 122* 100* 116* 132*   Lipid Profile: No results for input(s): CHOL, HDL, LDLCALC, TRIG, CHOLHDL, LDLDIRECT in the last 72 hours. Thyroid Function Tests: No results for input(s): TSH, T4TOTAL, FREET4, T3FREE, THYROIDAB in the last 72 hours. Anemia Panel: No results for input(s): VITAMINB12, FOLATE, FERRITIN, TIBC, IRON, RETICCTPCT in the last 72 hours. Urine analysis:    Component Value Date/Time   COLORURINE YELLOW 01/06/2016 0610  APPEARANCEUR CLOUDY* 01/06/2016 0610   LABSPEC 1.026 01/06/2016 0610   PHURINE 6.0 01/06/2016 0610   GLUCOSEU NEGATIVE 01/06/2016 0610   HGBUR LARGE* 01/06/2016 0610   BILIRUBINUR NEGATIVE 01/06/2016 0610   KETONESUR NEGATIVE 01/06/2016 0610   PROTEINUR 100* 01/06/2016 0610   NITRITE NEGATIVE 01/06/2016 0610   LEUKOCYTESUR NEGATIVE 01/06/2016 0610   Sepsis Labs: Invalid input(s): PROCALCITONIN, LACTICIDVEN  No results found for this or any previous visit (from the past 240 hour(s)).    Radiology Studies: No results found.   Scheduled Meds: . antiseptic oral rinse  7 mL Mouth Rinse q12n4p  . chlorhexidine  15 mL Mouth Rinse BID  . diltiazem  30 mg Oral Q6H  . feeding supplement (ENSURE ENLIVE)  237 mL Oral TID BM  . insulin aspart  0-15 Units Subcutaneous TID WC  . insulin aspart  0-5 Units Subcutaneous QHS  . metoprolol tartrate  25 mg Oral BID  . pantoprazole  40 mg Oral Daily   Continuous Infusions:    Phillips Climes, MD, [facility] Pager [phone]  If 7PM-7AM, please contact night-coverage [email or web address] Password [email or web address] 01/14/2016, 2:55 PM

## 2016-01-14 NOTE — Progress Notes (Signed)
Pt req pain meds, general discomfort. Paged on call. Morphine ordered but pt states she'd rather not take something so strong especially right before breakfast. Paged on call again for other option per pt request. Pending orders. Will let day RN know. Pt repositioned to comfort at this time.

## 2016-01-14 NOTE — Progress Notes (Signed)
Inpatient Rehabilitation  I visited pt. and her husband at the bedside.  Pt. was sitting up in the recliner and had her hair washed.  Pt. had a smile on her face today!  I have initiated insurance authorization process and await a decision from [facility].  Pt. will be followed by Gunnar Fusi tomorrow in my absence and can be reached at [phone]. I updated Ricki Miller, RNCM.   Please call if questions.  [place] Admissions Coordinator Cell [phone] Office [phone]

## 2016-01-14 NOTE — Progress Notes (Signed)
Interventional Radiology Progress Note   75 yo female SP left hip arthroplasty, with hospitalization complicated by left iliac artery injury & abdomen/pelvic hemorrhage, repair with covered Viabahn stent 12/26/2015.  ICU hospitalization further complicated by right atrial thrombus, subsequent catheter directed lysis, and surgical site hemorrhage.    IVC filter was placed and heparin was stopped.    Patient has been transferred out of ICU as of 6/27, and experienced a self-limiting lower GI hemorrhage.    Patient tells me she is doing much better, and has been progressing with her rehabilitation.    We will call the patient with an appointment as an outpatient for [place] [facility], which can occur 4-8 weeks after discharge home, at her convenience.  At that time we will order a non-invasive lower extremity arterial exam/ABI, and we can discuss future removal of her IVC filter.    Call with questions/concerns.    Signed,  Dulcy Fanny. Earleen Newport, DO

## 2016-01-15 ENCOUNTER — Encounter (HOSPITAL_COMMUNITY): Payer: Self-pay | Admitting: General Practice

## 2016-01-15 LAB — GLUCOSE, CAPILLARY
GLUCOSE-CAPILLARY: 138 mg/dL — AB (ref 65–99)
Glucose-Capillary: 126 mg/dL — ABNORMAL HIGH (ref 65–99)
Glucose-Capillary: 133 mg/dL — ABNORMAL HIGH (ref 65–99)
Glucose-Capillary: 126 mg/dL — ABNORMAL HIGH (ref 65–99)

## 2016-01-15 LAB — CBC
HEMATOCRIT: 29.7 % — AB (ref 36.0–46.0)
HEMOGLOBIN: 9.3 g/dL — AB (ref 12.0–15.0)
MCH: 30.9 pg (ref 26.0–34.0)
MCHC: 31.3 g/dL (ref 30.0–36.0)
MCV: 98.7 fL (ref 78.0–100.0)
Platelets: 283 10*3/uL (ref 150–400)
RBC: 3.01 MIL/uL — ABNORMAL LOW (ref 3.87–5.11)
RDW: 17.8 % — ABNORMAL HIGH (ref 11.5–15.5)
WBC: 9.5 10*3/uL (ref 4.0–10.5)

## 2016-01-15 LAB — BASIC METABOLIC PANEL
Anion gap: 6 (ref 5–15)
BUN: 15 mg/dL (ref 6–20)
CHLORIDE: 108 mmol/L (ref 101–111)
CO2: 24 mmol/L (ref 22–32)
Calcium: 8.7 mg/dL — ABNORMAL LOW (ref 8.9–10.3)
Creatinine, Ser: 0.42 mg/dL — ABNORMAL LOW (ref 0.44–1.00)
GFR calc non Af Amer: >60 mL/min (ref >60)
Glucose, Bld: 114 mg/dL — ABNORMAL HIGH (ref 65–99)
POTASSIUM: 3.5 mmol/L (ref 3.5–5.1)
SODIUM: 138 mmol/L (ref 135–145)

## 2016-01-15 LAB — PHOSPHORUS: PHOSPHORUS: 3.4 mg/dL (ref 2.5–4.6)

## 2016-01-15 MED ORDER — POTASSIUM CHLORIDE 20 MEQ PO PACK: 40.0000 meq | PACK | Freq: Once | ORAL | Status: DC

## 2016-01-15 MED ORDER — HYDROCODONE-ACETAMINOPHEN 5-325 MG PO TABS: 1.0000 | ORAL_TABLET | Freq: Every day | ORAL | Status: DC | PRN

## 2016-01-15 MED ORDER — ACETAMINOPHEN 325 MG PO TABS
650.0000 mg | ORAL_TABLET | Freq: Four times a day (QID) | ORAL | Status: DC | PRN
  Administered 2016-01-15 – 2016-01-16 (×3): 650 mg via ORAL
  Filled 2016-01-15 (×3): qty 2

## 2016-01-15 MED ORDER — POTASSIUM CHLORIDE 20 MEQ PO PACK
20.0000 meq | PACK | Freq: Once | ORAL | Status: AC
  Administered 2016-01-15: 20 meq via ORAL
  Filled 2016-01-15: qty 1

## 2016-01-15 NOTE — Progress Notes (Signed)
   Assessment: 20 Days Post-Op  S/P Procedure(s) (LRB): TOTAL HIP ARTHROPLASTY ANTERIOR APPROACH (Left) by Dr. Ernesta Amble. Percell Miller   Principal Problem:   Fracture of femoral neck, left (HCC) Active Problems:   Hip fracture (HCC)   Leukocytosis   Dehydration   Atrial thrombus (HCC)   Acute pulmonary embolism (HCC)   Acute respiratory failure (HCC)   Hemorrhagic shock   Cardiac arrest ([place])   Acute pulmonary embolus (HCC)   Atrial mass   Bleeding   Pulmonary embolus, left (HCC)   Pulmonary embolus, right (HCC)   Acute respiratory failure with hypoxia (HCC)   PEA (Pulseless electrical activity) (HCC)   Right atrial thrombus (HCC)   Arterial hemorrhage   Hypovolemic shock (HCC)   Hypokalemia   Hypernatremia   Gastrointestinal hemorrhage with melena   Blood per rectum   Hemorrhage   Female pelvic hematoma  Left leg is stable.  Incision C/D/I., thigh compartments soft. Extremities warm, +pulses. Conversant.  Eating, drinking, and voiding.  OOB some w/ therapy.    Plan: Up with therapy - Plan for CIR Friday/Monday  Weight Bearing: Weight Bearing as Tolerated (WBAT) left leg Dressings: prn. Okay to shower and wash incision with soap and water. VTE prophylaxis: per primary, ivc filter.  ASA recommended after discharge. Dispo: CIR  Subjective: Pain controlled. Continues to feel better, although PT has been quite tiring. Eating, drinking, voiding.     Objective:   VITALS:   Filed Vitals:   01/14/16 1048 01/14/16 1500 01/14/16 2006 01/15/16 0615  BP: 108/56 122/58 119/57 112/52  Pulse: 87  100 96  Temp:  97.1 F (36.2 C) 98.4 F (36.9 C) 98.2 F (36.8 C)  TempSrc:  Oral Oral Axillary  Resp:   17 17  Height:      Weight:   64.8 kg (142 lb 13.7 oz)   SpO2:  100% 95% 96%   CBC Latest Ref Rng 01/15/2016 01/14/2016 01/13/2016  WBC 4.0 - 10.5 K/uL 9.5 9.6 10.7(H)  Hemoglobin 12.0 - 15.0 g/dL 9.3(L) 9.6(L) 9.7(L)  Hematocrit 36.0 - 46.0 % 29.7(L) 30.3(L) 32.0(L)    Platelets 150 - 400 K/uL 283 297 305    Physical Exam General: Alert. Upright in bed. Musculoskeletal: Left leg is stable.  Incision C/D/I., thigh compartments soft. Extremities warm, +pulses. Dressing: c/d/i  Prudencio Burly III 01/15/2016, 6:19 AM

## 2016-01-15 NOTE — Progress Notes (Signed)
PROGRESS NOTE  Bonnie Salazar [id number] DOB: 1941-03-13 DOA: 12/25/2015 PCP: [name]   LOS: 21 days   Brief Narrative: 75 y.o. WF without significant past medical history,  sustained an accidental fall at home. With left femoral neck fracture, She was taken to the OR for operative repair 6/16. The initial repair appeared uneventful and she was extubated in the PACU. Shortly thereafter, she complained of abdominal pain and was noted to have a tense abdomen. CT scan showed extravasation into the LLQ; she was taken to IR and underwent covered stent of the distal external iliac artery to the inguinal ligament. Vascular surgery was also consulted. No signs of distal ischemia on his exam. There is some concern for abdominal compartment syndrome, as well. She received multiple units of pRBC,  FFP,  PLT pheresis, and cryoprecipitate.Returned to ICU on mechanical ventilation.She suffered a PEA arrest 6/18. ICU course complicated by further GI bleeding.IVC filter placed.   Assessment & Plan: Principal Problem:   Fracture of femoral neck, left (HCC) Active Problems:   Hip fracture (HCC)   Leukocytosis   Dehydration   Atrial thrombus (HCC)   Acute pulmonary embolism (HCC)   Acute respiratory failure (HCC)   Hemorrhagic shock   Cardiac arrest (HCC)   Acute pulmonary embolus (HCC)   Atrial mass   Bleeding   Pulmonary embolus, left (HCC)   Pulmonary embolus, right (HCC)   Acute respiratory failure with hypoxia (HCC)   PEA (Pulseless electrical activity) (HCC)   Right atrial thrombus (HCC)   Arterial hemorrhage   Hypovolemic shock (HCC)   Hypokalemia   Hypernatremia   Gastrointestinal hemorrhage with melena   Blood per rectum   Hemorrhage   Female pelvic hematoma    Left femoral neck fracture after accidental fall - S/P repair by orthopedic 6/16. WBAT - Out of bed to chair q shift - PT/OT evaluation recommending CIR. - We'll discontinue Robaxin and Vicodin given  patient reports she is forming 40 after starting these medications, will try to manage with Tylenol, and when necessary Vicodin before PT, as discussed with patient and husband.  Positive Acute bilateral PE-->RV strain  - due to probable propagation from right pelvic vein where might have formed in the setting of compression from hematoma - Venous Doppler lower extremity with no evidence of DVT on 6/17 - patient is s/p thrombolysis per IR  - Status post IVC filter placement by IR  Acute Hypoxic Respiratory Failure  - Secondary to bilateral PE : Resolved - O2 sat has been >95% on RA   PEA Arrest on 6/18(~10 minutes)/Troponin elevation - Occurred Post thrombolysis  - Most likely secondary demand ischemia   Paroxysmal atrial fibrillation/severe MR - All anticoagulants on hold secondary to patient's multiple sites of bleeding. - Review chart appears heparin drip stopped on 6/21 - Sever mitral regurgitation resolved on repeat echo 6/19 - HR well controlled. Continue Cardizem 30 mg QID and switch Metoprolol 25 mg BID to PO  RA thrombus - likely propagated from right pelvic vein compression from hematoma-->migrated and causing BILATERAL PE - S/P Thrombolysis by IR - RA thrombus no longer present on repeat echo 6/19 - IVC filter in place  Arterial hemorrhage s/p stenting and requiring massive transfusion (external iliac artery) - See significant events - 6/16 transfuse to 14 units PRBC, 8 units FFP, 2 units cryoprecipitate, 2 units platelets - 6/18 transfused 2 units PRBC, 8 units FFP - 6/21 transfused 2 units PRBC - H/H stable, continue to monitor.  GI bleed  - Review chart; appears patient last received heparin on 6/21 - 6/27 Overnight patient with significant melena/diarrhea - Switch Protonix 40 mg BID to PO. H/H stable.  - GI does not believe Ischemic colitis, recommends watchful waiting. No new episodes of melena.   Hypovolemic/hemorrhagic Shock  - resolved  6/21  Hypokalemia - Repleted, continue to monitor  Hypernatremia  - Resolved, continue to monitor closely  Protein Calorie Malnutrition - Initially on tube feeds, has been discontinued after she passed swallow evaluation, to poor by mouth intake, encouraged to drink ensure at least 3 times.  Dysphagia - 6/30 failed swallow study continue nothing by mouth, repeat study 7/3 showed improvement, change diet to dysphagia 1 with nectar thick liquids   Diarrhea - Most likely secondary to tube feeds. Patient afebrile. Diarrhea now  resolved  Mild metabolic acidosis  - Possibly combination of GI loss and malnutrition - Continue to monitor     DVT prophylaxis: SCD/IVC filter Code Status: Full Family Communication: Husband at bedside Disposition Plan: CIR when medically stable.  Consultants:   Orthopedics surgery  General surgery  IR  Cardiology  Procedures:   6/16 TOTAL HIP ARTHROPLASTY;Lt ext iliac artery bleed/pelvic hematoma  6/16 CT pelvis with contrast;Active bleeding in the pelvis. Originating fm Proximal left common femoral artery/distal left external iliac artery. Bleeding near origin of the left inferior epigastric artery.-Large amount of blood and hematoma formation throughout the abdomen and pelvis.  6/16 left lower extremity angiogram; emergent covered stent embolization of distal external iliac artery branch vessel  pseudoaneurysm/avulsion, with placement of stent graft 24mm x 5cm viabahn  ECHO 6/17 >> LVEF= 50-55%; - Mitral valve: Moderate prolapse w/ severe regurgitation. - Left atrium: mildly dilated.- Right ventricle: mildly dilated. - Right atrium: large mobile mass in the right atrium that prolapses toward the mitral valve.  CT chest 6/17 >> Bilateral pulmonary emboli, nearly occlusive in the left lower lobe segmental branches, involving all of the segmental pulmonary arteries. Evidence of right heart strain with right/left heart ratio> 1. -Heterogeneous  area of hypoattenuation within the right atrium may represent right atrial clot versus mixing of non-opacified blood from the inferior vena cava. Bilateral pleural effusions and atelectatic changes of bilateral lung bases.  LE [place] 6/17 >> negative for DVT  ECHO 6/18 >> LVEF 55-60%, normal wall motion, moderate MVP, mod to severe MR, RA mildly dilated, RA with large moblie pedunculated and irregular mass that prolapses through the TV c/w thrombus  CT ABD/Pelvis 6/18 >> no evidence of new intraperitoneal or pelvic hemorrhage, increasing hematoma in left proximal thigh, decreasing volume of pulmonary embolus in the bilateral lower lobes  6/18 catheter directed TPA for right atrial thrombus  6/18; PEA arrest  CT Head 6/18 >> No CT evidence of acute intracranial abnormality  ECHO 6/19 >> LVEF 50-55%, normal wall motion, no RWMA, previous RA mass no longer demonstrated on this ECHO, mild increase in PA systolic pressure  [id number] infrarenal IVC filter placement  7/2 moved to floor  Antimicrobials:  Perioperative Ancef  6/20 Zosyn >>> 6/26  6/21 Vancomycin >> 6/23  Subjective: - Reports lower back pain significantly improved, reports her last weakness, reports feeling foggy.  Objective: Filed Vitals:   01/14/16 1048 01/14/16 1500 01/14/16 2006 01/15/16 0615  BP: 108/56 122/58 119/57 112/52  Pulse: 87  100 96  Temp:  97.1 F (36.2 C) 98.4 F (36.9 C) 98.2 F (36.8 C)  TempSrc:  Oral Oral Axillary  Resp:   17 17  Height:      Weight:   64.8 kg (142 lb 13.7 oz)   SpO2:  100% 95% 96%   No intake or output data in the 24 hours ending 01/15/16 1014 Filed Weights   01/13/16 0404 01/14/16 0500 01/14/16 2006  Weight: 64.2 kg (141 lb 8.6 oz) 65 kg (143 lb 4.8 oz) 64.8 kg (142 lb 13.7 oz)    Examination: Constitutional: NAD Filed Vitals:   01/14/16 1048 01/14/16 1500 01/14/16 2006 01/15/16 0615  BP: 108/56 122/58 119/57 112/52  Pulse: 87  100 96  Temp:  97.1 F (36.2 C) 98.4 F  (36.9 C) 98.2 F (36.8 C)  TempSrc:  Oral Oral Axillary  Resp:   17 17  Height:      Weight:   64.8 kg (142 lb 13.7 oz)   SpO2:  100% 95% 96%    Eyes: PERRL, lids and conjunctivae normal ENMT: Mucous membranes are dry. No oropharyngeal exudates.  Respiratory: clear to auscultation bilaterally, no wheezing, no crackles. Weak respiratory effort. No accessory muscle use.  Cardiovascular: Regular rate and rhythm, no murmurs / rubs / gallops. No LE edema.  Abdomen: no tenderness. Bowel sounds positive.  Musculoskeletal: no clubbing / cyanosis.  Neurologic: non focal    Data Reviewed: I have personally reviewed following labs and imaging studies  CBC:  Recent Labs Lab 01/10/16 0358 01/11/16 0541 01/12/16 0304 01/13/16 0340 01/14/16 0405 01/15/16 0350  WBC 12.7* 12.7* 11.1* 10.7* 9.6 9.5  NEUTROABS 10.3* 10.6* 9.0* 8.4* 7.8*  --   HGB 9.8* 9.8* 9.5* 9.7* 9.6* 9.3*  HCT 33.9* 32.6* 31.2* 32.0* 30.3* 29.7*  MCV 103.0* 100.9* 99.7 99.1 98.1 98.7  PLT 289 278 287 305 297 [id number]   Basic Metabolic Panel:  Recent Labs Lab 01/10/16 0358 01/11/16 0541 01/12/16 0304 01/13/16 0340 01/14/16 0405 01/15/16 0350  NA 153* 146* 148* 140 138 138  K 3.1* 3.2* 3.6 3.5 3.7 3.5  CL 124* 117* 110 112* 108 108  CO2 25 22 21* 23 23 24   GLUCOSE 145* 159* 130* 118* 114* 114*  BUN 44* 35* 28* 24* 15 15  CREATININE 0.52 0.49 0.45 0.49 0.49 0.42*  CALCIUM 8.9 8.7* 9.8 8.8* 8.9 8.7*  MG 2.3 2.1 2.1 2.0 2.3  --   PHOS  --   --   --   --   --  3.4   GFR: Estimated Creatinine Clearance: 60 mL/min (by C-G formula based on Cr of 0.42). Liver Function Tests:  Recent Labs Lab 01/14/16 0405  AST 55*  ALT 69*  ALKPHOS 80  BILITOT 0.9  PROT 4.9*  ALBUMIN 2.3*   No results for input(s): LIPASE, AMYLASE in the last 168 hours. No results for input(s): AMMONIA in the last 168 hours. Coagulation Profile: No results for input(s): INR, PROTIME in the last 168 hours. Cardiac Enzymes: No results  for input(s): CKTOTAL, CKMB, CKMBINDEX, TROPONINI in the last 168 hours. BNP (last 3 results) No results for input(s): PROBNP in the last 8760 hours. HbA1C: No results for input(s): HGBA1C in the last 72 hours. CBG:  Recent Labs Lab 01/14/16 0626 01/14/16 1122 01/14/16 1646 01/14/16 2207 01/15/16 0613  GLUCAP 116* 132* 106* 114* 126*   Lipid Profile: No results for input(s): CHOL, HDL, LDLCALC, TRIG, CHOLHDL, LDLDIRECT in the last 72 hours. Thyroid Function Tests: No results for input(s): TSH, T4TOTAL, FREET4, T3FREE, THYROIDAB in the last 72 hours. Anemia Panel: No results for input(s): VITAMINB12, FOLATE, FERRITIN, TIBC, IRON, RETICCTPCT in the  last 72 hours. Urine analysis:    Component Value Date/Time   COLORURINE YELLOW 01/06/2016 0610   APPEARANCEUR CLOUDY* 01/06/2016 0610   LABSPEC 1.026 01/06/2016 0610   PHURINE 6.0 01/06/2016 0610   GLUCOSEU NEGATIVE 01/06/2016 0610   HGBUR LARGE* 01/06/2016 0610   BILIRUBINUR NEGATIVE 01/06/2016 0610   KETONESUR NEGATIVE 01/06/2016 0610   PROTEINUR 100* 01/06/2016 0610   NITRITE NEGATIVE 01/06/2016 0610   LEUKOCYTESUR NEGATIVE 01/06/2016 0610   Sepsis Labs: Invalid input(s): PROCALCITONIN, LACTICIDVEN  No results found for this or any previous visit (from the past 240 hour(s)).    Radiology Studies: No results found.   Scheduled Meds: . antiseptic oral rinse  7 mL Mouth Rinse q12n4p  . chlorhexidine  15 mL Mouth Rinse BID  . diltiazem  30 mg Oral Q6H  . feeding supplement (ENSURE ENLIVE)  237 mL Oral TID BM  . insulin aspart  0-15 Units Subcutaneous TID WC  . insulin aspart  0-5 Units Subcutaneous QHS  . metoprolol tartrate  25 mg Oral BID  . pantoprazole  40 mg Oral Daily  . potassium chloride  20 mEq Oral Once   Continuous Infusions:    Phillips Climes, MD, [facility] Pager [phone]  If 7PM-7AM, please contact night-coverage [email or web address] Password [email or web address] 01/15/2016, 10:14 AM

## 2016-01-15 NOTE — Progress Notes (Signed)
Physical Therapy Treatment Patient Details Name: Bonnie Salazar MRN: [id number] DOB: Mar 01, 1941 Today's Date: 01/15/2016    History of Present Illness 75 y.o. female admitted to [facility] on [facility]25[facility]15 s/p fall with resultant L hip fx s/p L direct anterior THA, WBAT. Pt was extubated in the PACU. Shortly thereafter, she complained of abdominal pain and was noted to have a tense abdomen. CT scan showed extravasation into the LLQ; she was taken to IR and underwent covered stent of the distal external iliac artery to the inguinal ligament. Vascular surgery was also consulted. No signs of distal ischemia on his exam. There is some concern for abdominal compartment syndrome, as well. MTP was initiated and she received 11u pRBC, 7FFP, 2 PLT pheresis, 2 cryoprecipitate. Returned to ICU on mechanical ventilation. She suffered a PEA arrest 6/18. ICU course complicated by further bleeding. IVC filter placed. Intubated 6/16-6/17/17 and 6/18-6/23/17. Pt with significant PMHx of back surgery listed in chart. bil PE found on CT 6/17.     PT Comments    Pt reports increased back pain and fatigue.  Reports she did not sleep well.  Pt remains to require encouragement to advance mobility at this time.  Pt performing improved activity and appears appropriate for rehab in post acute setting once medically stable.    Follow Up Recommendations  CIR     Equipment Recommendations  Rolling walker with 5" wheels;3in1 (PT)    Recommendations for Other Services Rehab consult     Precautions / Restrictions Precautions Precautions: Fall Restrictions Weight Bearing Restrictions: Yes LLE Weight Bearing: Weight bearing as tolerated    Mobility  Bed Mobility Overal bed mobility: Needs Assistance Bed Mobility: Supine to Sit     Supine to sit: Mod assist     General bed mobility comments: Pt presents with more fatigue and low back pain and required mod assist to elevate trunk into seated position.     Transfers Overall transfer level: Needs assistance Equipment used: Rolling walker (2 wheeled) Transfers: Sit to/from Stand Sit to Stand: Mod assist Stand pivot transfers: Mod assist       General transfer comment: Required assist to boost into standing performed standing from bed and BSC.  Pt required cues for hand placement, forward weight shifting and transition of hand from seated surface to RW grips.    Ambulation/Gait Ambulation/Gait assistance: Mod assist Ambulation Distance (Feet): 18 Feet Assistive device: Rolling walker (2 wheeled) Gait Pattern/deviations: Step-to pattern;Antalgic;Decreased stride length;Trunk flexed;Shuffle Gait velocity: very slow.     General Gait Details: Pt remains to fatigue quickly and requires encouragement to advance gait distance.  Pt required assist to position walker with max VCs for step sequencing.  Pt flexing over RW and requiring cues to correct posture for safety.  Pt presents with improved gait speed and able to advance gait distance this tx.  Close chair follow provided.       Stairs            Wheelchair Mobility    Modified Rankin (Stroke Patients Only)       Balance Overall balance assessment: Needs assistance Sitting-balance support: No upper extremity supported Sitting balance-Leahy Scale: Fair       Standing balance-Leahy Scale: Poor                      Cognition Arousal/Alertness: Awake/alert Behavior During Therapy: WFL for tasks assessed/performed Overall Cognitive Status: Within Functional Limits for tasks assessed  Following Commands: Follows one step commands with increased time            Exercises      General Comments        Pertinent Vitals/Pain Pain Assessment: 0-10 Pain Score: 8  Pain Location: back Pain Descriptors / Indicators: Sore;Discomfort Pain Intervention(s): Monitored during session;Repositioned    Home Living                      Prior  Function            PT Goals (current goals can now be found in the care plan section) Acute Rehab PT Goals Patient Stated Goal: none stated Potential to Achieve Goals: Fair Progress towards PT goals: Progressing toward goals    Frequency  Min 5X/week    PT Plan Current plan remains appropriate    Co-evaluation             End of Session Equipment Utilized During Treatment: Gait belt Activity Tolerance: Patient limited by fatigue Patient left: with call bell/phone within reach;with family/visitor present;in chair     Time: [id number] PT Time Calculation (min) (ACUTE ONLY): 23 min  Charges:  $Gait Training: 8-22 mins $Therapeutic Activity: 8-22 mins                    G Codes:      Cristela Blue 02-10-2016, 11:36 AM  Governor Rooks, PTA pager [phone]

## 2016-01-15 NOTE — H&P (Signed)
History not complete at this time as patient & husband asking admission nurse to come back at another time .

## 2016-01-15 NOTE — Progress Notes (Signed)
Inpatient Rehabilitation  I received insurance authorization from [facility] for [place] admission today.  Notified Dr. Waldron Labs, who stated that patient is not medically ready today.  He discussed the plan for admission on Friday, 01/16/16 or Saturday, 01/17/16.  RN CM and patient aware.  Please call with questions.    Carmelia Roller., CCC/SLP Admission Coordinator  [place]  Cell [phone]

## 2016-01-15 NOTE — Care Management Important Message (Signed)
Important Message  Patient Details  Name: Bonnie Salazar MRN: [id number] Date of Birth: 1940/07/26   Medicare Important Message Given:  Yes    Loann Quill 01/15/2016, 9:03 AM

## 2016-01-15 NOTE — Progress Notes (Signed)
Speech Language Pathology Dysphagia Treatment Patient Details Name: Bonnie Salazar MRN: [id number] DOB: [birth year] Today's Date: 01/15/2016 Time: [id number] SLP Time Calculation (min) (ACUTE ONLY): 18 min  Assessment / Plan / Recommendation Clinical Impression   [name] reported he has been giving pt thin liquids all weekend and all week ater and Ensure), but needs her diet to be changed so she can get tea on her trays. Again reviewed results of most recent MBSS with pt and her spouse including silent laryngeal penetration with thin. Pt was observed with thin liquids via spoon, cup and straw. Pt trialed mastication with minimal amount of cracker. Pt maintained clear vocal quality, but obviously fatigues quickly. Discussed small meals with frequent rest breaks during meal. Encouraged no straw use as this appeared to tax pt's system further. Reviewed the importance of intermittent cough/swallow to clear any penetrated material- pt able to demonstrate following model. Agreed to advance diet to Dys 1 with thins since pt has already been receiving thins without apparent decline. Encouraged maximizing safe intake with strict adherence to swallow precautions. Upright positioning, Small sips/bites, no straws, alternate solids with liquids, multiple swallows and intermittent cough/swallow. Pt to be monitored closely for tolerance. Discussed plan with RN    Diet Recommendation       SLP Plan Continue with current plan of care   No pain reported, fatigue only.   Swallowing Goals     General Behavior/Cognition: Alert;Cooperative;Requires cueing Patient Positioning: Upright in chair/Tumbleform HPI: Pt is a 75 y.o. female without significant PMH, admitted 6/15 after sustaining a fall with L femoral neck fracture. Pt was intubated 6/16 for surgery, extubated 6/17. On 6/18 pt with sudden drop in BP/ back pain while on TPA, then PEA arrest and required resuscitation and was re-intubated, extubated 6/23.  Noted to have strong cough and able to speak name. Most recent CXR showed persistent atelectasis versus consolidation LLL. Bedside swallow eval ordered due to prolonged/ multiple intubations. MBS 6/28 revealed moderate dysphagia with frank penetration of puree and honey-thick consistencies.  Recs for continued NPO; trial POs with SLP.   Oral Cavity - Oral Hygiene     Dysphagia Treatment Family/Caregiver Educated: husband Treatment Methods: Skilled observation;Therapeutic exercise;Differential diagnosis;Upgraded PO texture trial;Patient/caregiver education Patient observed directly with PO's: Yes Type of PO's observed: Thin liquids;Dysphagia 1 (puree);Regular;Ice chips Feeding: Needs assist Liquids provided via: Cup;Straw Oral Phase Signs & Symptoms: Prolonged bolus formation Pharyngeal Phase Signs & Symptoms: Suspected delayed swallow initiation;Multiple swallows Type of cueing: Verbal Amount of cueing: Minimal   GO     Bonnie Bergamo MA, CCC-SLP Pager [phone] 01/15/2016, 11:45 AM

## 2016-01-15 NOTE — Care Management (Signed)
Patient is for shortterm rehab. CIR is husband's preference. Waiting for Insurance authorization.

## 2016-01-16 ENCOUNTER — Inpatient Hospital Stay (HOSPITAL_COMMUNITY): Payer: Medicare HMO

## 2016-01-16 ENCOUNTER — Inpatient Hospital Stay (HOSPITAL_COMMUNITY)
Admission: RE | Admit: 2016-01-16 | Discharge: 2016-02-04 | DRG: 559 | Disposition: A | Discharge status: Home Health | Payer: Medicare HMO | Source: Intra-hospital | Attending: Physical Medicine & Rehabilitation | Admitting: Physical Medicine & Rehabilitation

## 2016-01-16 DIAGNOSIS — R1310 Dysphagia, unspecified: Secondary | ICD-10-CM | POA: Insufficient documentation

## 2016-01-16 DIAGNOSIS — K5901 Slow transit constipation: Secondary | ICD-10-CM | POA: Diagnosis present

## 2016-01-16 DIAGNOSIS — Z79899 Other long term (current) drug therapy: Secondary | ICD-10-CM | POA: Diagnosis not present

## 2016-01-16 DIAGNOSIS — D72829 Elevated white blood cell count, unspecified: Secondary | ICD-10-CM

## 2016-01-16 DIAGNOSIS — K7200 Acute and subacute hepatic failure without coma: Secondary | ICD-10-CM | POA: Diagnosis not present

## 2016-01-16 DIAGNOSIS — R5381 Other malaise: Secondary | ICD-10-CM | POA: Diagnosis not present

## 2016-01-16 DIAGNOSIS — I2699 Other pulmonary embolism without acute cor pulmonale: Secondary | ICD-10-CM | POA: Diagnosis not present

## 2016-01-16 DIAGNOSIS — Z8674 Personal history of sudden cardiac arrest: Secondary | ICD-10-CM | POA: Diagnosis not present

## 2016-01-16 DIAGNOSIS — Z95828 Presence of other vascular implants and grafts: Secondary | ICD-10-CM

## 2016-01-16 DIAGNOSIS — I82413 Acute embolism and thrombosis of femoral vein, bilateral: Secondary | ICD-10-CM | POA: Diagnosis not present

## 2016-01-16 DIAGNOSIS — K922 Gastrointestinal hemorrhage, unspecified: Secondary | ICD-10-CM | POA: Diagnosis not present

## 2016-01-16 DIAGNOSIS — D62 Acute posthemorrhagic anemia: Secondary | ICD-10-CM | POA: Insufficient documentation

## 2016-01-16 DIAGNOSIS — F4321 Adjustment disorder with depressed mood: Secondary | ICD-10-CM | POA: Diagnosis present

## 2016-01-16 DIAGNOSIS — K921 Melena: Secondary | ICD-10-CM

## 2016-01-16 DIAGNOSIS — Z86711 Personal history of pulmonary embolism: Secondary | ICD-10-CM | POA: Diagnosis not present

## 2016-01-16 DIAGNOSIS — Z8781 Personal history of (healed) traumatic fracture: Secondary | ICD-10-CM | POA: Insufficient documentation

## 2016-01-16 DIAGNOSIS — G8918 Other acute postprocedural pain: Secondary | ICD-10-CM | POA: Insufficient documentation

## 2016-01-16 DIAGNOSIS — F329 Major depressive disorder, single episode, unspecified: Secondary | ICD-10-CM

## 2016-01-16 DIAGNOSIS — I480 Paroxysmal atrial fibrillation: Secondary | ICD-10-CM | POA: Diagnosis not present

## 2016-01-16 DIAGNOSIS — G8929 Other chronic pain: Secondary | ICD-10-CM | POA: Diagnosis present

## 2016-01-16 DIAGNOSIS — M545 Low back pain, unspecified: Secondary | ICD-10-CM | POA: Insufficient documentation

## 2016-01-16 DIAGNOSIS — Z823 Family history of stroke: Secondary | ICD-10-CM | POA: Diagnosis not present

## 2016-01-16 DIAGNOSIS — Z96642 Presence of left artificial hip joint: Secondary | ICD-10-CM | POA: Diagnosis not present

## 2016-01-16 DIAGNOSIS — E876 Hypokalemia: Secondary | ICD-10-CM

## 2016-01-16 DIAGNOSIS — I82419 Acute embolism and thrombosis of unspecified femoral vein: Secondary | ICD-10-CM | POA: Diagnosis present

## 2016-01-16 DIAGNOSIS — Z471 Aftercare following joint replacement surgery: Secondary | ICD-10-CM | POA: Diagnosis not present

## 2016-01-16 DIAGNOSIS — K661 Hemoperitoneum: Secondary | ICD-10-CM

## 2016-01-16 DIAGNOSIS — N390 Urinary tract infection, site not specified: Secondary | ICD-10-CM | POA: Insufficient documentation

## 2016-01-16 DIAGNOSIS — Z87891 Personal history of nicotine dependence: Secondary | ICD-10-CM

## 2016-01-16 DIAGNOSIS — R609 Edema, unspecified: Secondary | ICD-10-CM | POA: Insufficient documentation

## 2016-01-16 DIAGNOSIS — R269 Unspecified abnormalities of gait and mobility: Secondary | ICD-10-CM | POA: Insufficient documentation

## 2016-01-16 DIAGNOSIS — I213 ST elevation (STEMI) myocardial infarction of unspecified site: Secondary | ICD-10-CM

## 2016-01-16 DIAGNOSIS — R339 Retention of urine, unspecified: Secondary | ICD-10-CM | POA: Insufficient documentation

## 2016-01-16 DIAGNOSIS — R1900 Intra-abdominal and pelvic swelling, mass and lump, unspecified site: Secondary | ICD-10-CM | POA: Diagnosis not present

## 2016-01-16 DIAGNOSIS — R571 Hypovolemic shock: Secondary | ICD-10-CM

## 2016-01-16 DIAGNOSIS — R52 Pain, unspecified: Secondary | ICD-10-CM | POA: Diagnosis not present

## 2016-01-16 DIAGNOSIS — E870 Hyperosmolality and hypernatremia: Secondary | ICD-10-CM

## 2016-01-16 DIAGNOSIS — I82403 Acute embolism and thrombosis of unspecified deep veins of lower extremity, bilateral: Secondary | ICD-10-CM | POA: Diagnosis not present

## 2016-01-16 DIAGNOSIS — F32A Depression, unspecified: Secondary | ICD-10-CM | POA: Insufficient documentation

## 2016-01-16 DIAGNOSIS — R1312 Dysphagia, oropharyngeal phase: Secondary | ICD-10-CM | POA: Diagnosis present

## 2016-01-16 DIAGNOSIS — E860 Dehydration: Secondary | ICD-10-CM

## 2016-01-16 DIAGNOSIS — R69 Illness, unspecified: Secondary | ICD-10-CM | POA: Diagnosis not present

## 2016-01-16 DIAGNOSIS — S72002A Fracture of unspecified part of neck of left femur, initial encounter for closed fracture: Secondary | ICD-10-CM | POA: Diagnosis present

## 2016-01-16 DIAGNOSIS — R9341 Abnormal radiologic findings on diagnostic imaging of renal pelvis, ureter, or bladder: Secondary | ICD-10-CM | POA: Diagnosis not present

## 2016-01-16 LAB — GLUCOSE, CAPILLARY
GLUCOSE-CAPILLARY: 135 mg/dL — AB (ref 65–99)
GLUCOSE-CAPILLARY: 147 mg/dL — AB (ref 65–99)
Glucose-Capillary: 127 mg/dL — ABNORMAL HIGH (ref 65–99)
Glucose-Capillary: 123 mg/dL — ABNORMAL HIGH (ref 65–99)

## 2016-01-16 MED ORDER — FLEET ENEMA 7-19 GM/118ML RE ENEM: 1.0000 | ENEMA | Freq: Once | RECTAL | Status: DC | PRN

## 2016-01-16 MED ORDER — ENSURE ENLIVE PO LIQD: 237.0000 mL | Freq: Three times a day (TID) | ORAL | Status: DC

## 2016-01-16 MED ORDER — PANTOPRAZOLE SODIUM 40 MG PO TBEC: 40.0000 mg | DELAYED_RELEASE_TABLET | Freq: Every day | ORAL | Status: DC

## 2016-01-16 MED ORDER — DIPHENHYDRAMINE HCL 12.5 MG/5ML PO ELIX: 12.5000 mg | ORAL_SOLUTION | Freq: Four times a day (QID) | ORAL | Status: DC | PRN

## 2016-01-16 MED ORDER — TRAZODONE HCL 50 MG PO TABS
25.0000 mg | ORAL_TABLET | Freq: Every evening | ORAL | Status: DC | PRN
Start: 2016-01-16 — End: 2016-02-04
  Administered 2016-01-16: 25 mg via ORAL
  Administered 2016-01-17 – 2016-01-18 (×2): 50 mg via ORAL
  Filled 2016-01-16 (×4): qty 1

## 2016-01-16 MED ORDER — ACETAMINOPHEN 325 MG PO TABS
650.0000 mg | ORAL_TABLET | Freq: Three times a day (TID) | ORAL | Status: DC
  Administered 2016-01-16 – 2016-02-04 (×74): 650 mg via ORAL
  Filled 2016-01-16 (×76): qty 2

## 2016-01-16 MED ORDER — CETYLPYRIDINIUM CHLORIDE 0.05 % MT LIQD
7.0000 mL | Freq: Two times a day (BID) | OROMUCOSAL | Status: DC
  Administered 2016-01-16 – 2016-01-19 (×3): 7 mL via OROMUCOSAL

## 2016-01-16 MED ORDER — DOCUSATE SODIUM 50 MG/5ML PO LIQD: 100.0000 mg | Freq: Two times a day (BID) | ORAL | Status: DC | PRN

## 2016-01-16 MED ORDER — PROCHLORPERAZINE EDISYLATE 5 MG/ML IJ SOLN: 5.0000 mg | Freq: Four times a day (QID) | INTRAMUSCULAR | Status: DC | PRN

## 2016-01-16 MED ORDER — BISACODYL 10 MG RE SUPP: 10.0000 mg | Freq: Every day | RECTAL | Status: DC | PRN

## 2016-01-16 MED ORDER — DILTIAZEM HCL 30 MG PO TABS: 30.0000 mg | ORAL_TABLET | Freq: Four times a day (QID) | ORAL | Status: DC

## 2016-01-16 MED ORDER — METOPROLOL TARTRATE 25 MG PO TABS
25.0000 mg | ORAL_TABLET | Freq: Two times a day (BID) | ORAL | Status: DC
  Administered 2016-01-16 – 2016-01-19 (×6): 25 mg via ORAL
  Filled 2016-01-16 (×7): qty 1

## 2016-01-16 MED ORDER — TRAMADOL HCL 50 MG PO TABS
50.0000 mg | ORAL_TABLET | Freq: Four times a day (QID) | ORAL | Status: DC | PRN
  Administered 2016-01-16 – 2016-02-01 (×11): 50 mg via ORAL
  Filled 2016-01-16 (×11): qty 1

## 2016-01-16 MED ORDER — GUAIFENESIN-DM 100-10 MG/5ML PO SYRP: 5.0000 mL | ORAL_SOLUTION | Freq: Four times a day (QID) | ORAL | Status: DC | PRN

## 2016-01-16 MED ORDER — ALUM & MAG HYDROXIDE-SIMETH 200-200-20 MG/5ML PO SUSP: 30.0000 mL | ORAL | Status: DC | PRN

## 2016-01-16 MED ORDER — PROCHLORPERAZINE 25 MG RE SUPP
12.5000 mg | Freq: Four times a day (QID) | RECTAL | Status: DC | PRN
  Filled 2016-01-16: qty 1

## 2016-01-16 MED ORDER — METHOCARBAMOL 500 MG PO TABS: 500.0000 mg | ORAL_TABLET | Freq: Four times a day (QID) | ORAL | Status: DC | PRN

## 2016-01-16 MED ORDER — ACETAMINOPHEN 325 MG PO TABS: 650.0000 mg | ORAL_TABLET | Freq: Four times a day (QID) | ORAL | Status: AC | PRN

## 2016-01-16 MED ORDER — METOPROLOL TARTRATE 25 MG PO TABS: 25.0000 mg | ORAL_TABLET | Freq: Two times a day (BID) | ORAL | Status: DC

## 2016-01-16 MED ORDER — ENSURE ENLIVE PO LIQD
237.0000 mL | Freq: Three times a day (TID) | ORAL | Status: DC
  Administered 2016-01-16 – 2016-01-22 (×15): 237 mL via ORAL

## 2016-01-16 MED ORDER — DILTIAZEM HCL 30 MG PO TABS
30.0000 mg | ORAL_TABLET | Freq: Four times a day (QID) | ORAL | Status: DC
  Administered 2016-01-16 – 2016-01-18 (×8): 30 mg via ORAL
  Filled 2016-01-16 (×10): qty 1

## 2016-01-16 MED ORDER — PROCHLORPERAZINE MALEATE 5 MG PO TABS
5.0000 mg | ORAL_TABLET | Freq: Four times a day (QID) | ORAL | Status: DC | PRN
Start: 2016-01-16 — End: 2016-02-04

## 2016-01-16 MED ORDER — HYDROCODONE-ACETAMINOPHEN 5-325 MG PO TABS: 1.0000 | ORAL_TABLET | Freq: Four times a day (QID) | ORAL | Status: DC | PRN

## 2016-01-16 MED ORDER — ASPIRIN EC 81 MG PO TBEC: 81.0000 mg | DELAYED_RELEASE_TABLET | Freq: Every day | ORAL | Status: DC

## 2016-01-16 MED ORDER — PANTOPRAZOLE SODIUM 40 MG PO TBEC
40.0000 mg | DELAYED_RELEASE_TABLET | Freq: Every day | ORAL | Status: DC
  Filled 2016-01-16: qty 1

## 2016-01-16 NOTE — H&P (Signed)
Physical Medicine and Rehabilitation Admission H&P    Chief Complaint  Patient presents with  .  Debility after left hip fracture complicated by arterial bleed, hemorrhagic shock, bilateral PE, PEA,  Atrial thrombus, GIB. medical issues    HPI:  Bonnie Salazar is a 75 y.o. female admitted on 12/25/15 after a fall with subsequent left femoral neck fracture. She underwent left anterior hip arthroplasty by Dr. Fredonia Highland. Post op course complicated by abdominal distension with hypotension due to bleeding from distal iliac artery. She underwent distal external iliac artery stent down to inguinal ligament. She was treated with 11u PRBC, 7 units FFP, 2 units PLT pheresis and cryoprecipitate for life threatening bleed. Dr. Hulen Skains consulted and recommended monitoring. She tolerated extubation on 06/17 and ortho recommended WBAT with ASA for DVT prophylaxis. BLE dopplers 6/17 were negative for DVT. 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 She developed respiratory distress with marked tachycardia on 06/18 requiring face mask. CTA chest done showing bilateral PE with completely occlusive thrombus in LLL PA with nearly occlusive segmental pulmonary arteries and right heart strain. Dr. Cyndia Bent consulted and following for input. She underwent catheter lysis of intra-atrial clot by Dr. Earleen Newport. Repeat echo 06/19 revealed that RA thrombus no longer present.   Post procedure developed leg, back and abdominal pain with hypotension requiring neo and was transfused with 2 units PRBC. She was sedated and intubated for airway protection. She developed PEA cardiac arrest later that evening requiring 10 minutes of resuscitation. CTA abdomen/pelvis showed decrease in extraperitoneal bleed, compression of left external iliac and comon iliac veins by pelvic hematoma, no new pelvic hematoma and enlarging hematoma  proximal left thigh. CT head without acute changes. Heparin was discontinued and IVC filter placed on 06/21. Has been treated with antibiotics for fevers as well as IV diuresis for fluid overload. She tolerated extubation on 06/23 and continues to be NPO due to high aspiration risk. MBS done yesterday revealing oropharyngeal dysphagia with delay in swallow, moderate residue, fatigue and aspiration. She developed bloody stools on 06/26 and GI consulted for input. Dr. Silverio Decamp questioned ischemic colitis v/s hemorrhoids as cause of bleeding and supportive care recommended. She has had intermittent rectal bleeding felt to be due to hems. H/H has been relatively stable and leucocytosis has resolved. Repeat swallow evaluation done on  7/03 and she was started on dysphagia 1, nectar liquids. Tube feeds discontinued with resolution of  hypernatremia and diarrhea.  Patient continue to have DOE but mobility improving and patient remains motivated. CIR recommended for follow up therapy and patient cleared medically for intensive rehab program.    Review of Systems  HENT: Negative for hearing loss.   Eyes: Negative for blurred vision and double vision.  Respiratory: Positive for shortness of breath. Negative for cough.   Cardiovascular: Negative for chest pain and palpitations.  Gastrointestinal: Negative for heartburn, nausea, vomiting and constipation.  Genitourinary: Negative for dysuria and urgency.  Musculoskeletal: Positive for myalgias, back pain (reporting severe back pain when up) and joint pain.  Neurological: Positive for weakness. Negative for dizziness, tingling, sensory change and headaches.  Psychiatric/Behavioral: Negative for depression. The patient is nervous/anxious. All other systems reviewed and are negative     Past Medical History  Diagnosis Date  . Medical history non-contributory   . Acute pulmonary embolism ([place]) 12/28/2015    Past Surgical History  Procedure Laterality  Date   . Back surgery    . 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 Hodgkin's lymphoma Mother   . Stroke Father   . Coronary artery disease Father     in his late 50s    Social History:  Married. Independent without AD PTA. Per reports that she has quit smoking. She quit smokeless tobacco use about 20 years ago. Per reports that she does not drink alcohol or use illicit drugs.    Allergies: No Known Allergies    Medications Prior to Admission  Medication Sig Dispense Refill  . CALCIUM PO Take 2 tablets by mouth daily.    . chlorhexidine (PERIDEX) 0.12 % solution 15 mLs by Mouth Rinse route 2 (two) times daily.    . Cholecalciferol (VITAMIN D PO) Take 1 capsule by mouth daily.    . fluticasone (FLONASE) 50 MCG/ACT nasal spray Place 2 sprays into the nose daily as needed for allergies.     Marland Kitchen GINKGO BILOBA PO Take 1 capsule by mouth daily.    . Misc Natural Products (GLUCOSAMINE CHONDROITIN TRIPLE) TABS Take 2 tablets by mouth daily.    . Multiple Vitamin (MULTIVITAMIN WITH MINERALS) TABS tablet Take 1 tablet by mouth daily.    . Multiple Vitamins-Minerals (ICAPS AREDS 2) CAPS Take 1 capsule by mouth 2 (two) times daily.    . naproxen sodium (ANAPROX) 220 MG tablet Take 220 mg by mouth at bedtime as needed (pain).    . Omega-3 Fatty Acids (OMEGA 3 PO) Take 1 capsule by mouth daily.      Home: Home Living Family/patient expects to be discharged to:: Inpatient rehab Living Arrangements: Spouse/significant other (retired) Available Help at Discharge: Family, Available 24 hours/day Additional Comments: Difficult hx due to low tone of voice and slow processing.   Functional History: Prior Function Level of Independence: Independent Comments: per pt she did not use a RW or cane  PTA  Functional Status:  Mobility: Bed Mobility Overal bed mobility: Needs Assistance Bed Mobility: Supine to Sit Supine to sit: Mod assist Sit to supine: Max assist General bed mobility comments: Pt presents with more fatigue and low back pain and required mod assist to elevate trunk into seated position.   Transfers Overall transfer level: Needs assistance Equipment used: Rolling walker (2 wheeled) Transfers: Sit to/from Stand Sit to Stand: Mod assist Stand pivot transfers: Mod assist General transfer comment: Required assist to boost into standing performed standing from bed and BSC.  Pt required cues for hand placement, forward weight shifting and transition of hand from seated surface to RW grips.   Ambulation/Gait Ambulation/Gait assistance: Mod assist Ambulation Distance (Feet): 18 Feet Assistive device: Rolling walker (2 wheeled) Gait Pattern/deviations: Step-to pattern, Antalgic, Decreased stride length, Trunk flexed, Shuffle General Gait Details: Pt remains to fatigue quickly and requires encouragement to advance gait distance.  Pt required assist to position walker with max VCs for step sequencing.  Pt flexing over RW and requiring cues to correct posture for safety.  Pt presents with improved gait speed and able to advance gait distance this tx.  Close chair follow provided.     Gait velocity: very slow.   Gait velocity interpretation: Below normal speed for age/gender    ADL: ADL Overall ADL's : Needs assistance/impaired Eating/Feeding: NPO Grooming:  Wash/dry hands, Wash/dry face, Standing, Min guard Grooming Details (indicate cue type and reason): pt seated for hair washing by therapist. Pt encouraged to participate, however she declined multiple times Upper Body Bathing: Total assistance, Bed level, Sitting Lower Body Bathing: Total assistance, Sit to/from stand Upper Body Dressing : Sitting, Minimal assistance Lower Body Dressing: Total assistance, Bed level, Sit  to/from stand Toilet Transfer: RW, +2 for safety/equipment, Comfort height toilet, Ambulation, Moderate assistance Toileting- Clothing Manipulation and Hygiene: Moderate assistance, Sit to/from stand Functional mobility during ADLs: Moderate assistance, +2 for safety/equipment General ADL Comments: refused to stand a second time for [facility] T/F   Cognition: Cognition Overall Cognitive Status: Within Functional Limits for tasks assessed Orientation Level: Oriented to person, Oriented to place, Disoriented to time, Oriented to situation Cognition Arousal/Alertness: Awake/alert Behavior During Therapy: WFL for tasks assessed/performed Overall Cognitive Status: Within Functional Limits for tasks assessed Area of Impairment: Attention Current Attention Level: Sustained Memory: Decreased short-term memory Following Commands: Follows one step commands with increased time Problem Solving: Decreased initiation General Comments: Appears to require significant effort to perform minimal motor movement/follow commands    Blood pressure 112/52, pulse 96, temperature 98.2 F (36.8 C), temperature source Axillary, resp. rate 17, height 5\' 7"  (1.702 m), weight 64.8 kg (142 lb 13.7 oz), SpO2 96 %. Physical Exam  Nursing note and vitals reviewed. Constitutional: She is oriented to person, place, and time. She appears well-developed and well-nourished.  HENT:  Head: Normocephalic and atraumatic.  Eyes: Conjunctivae are normal. Pupils are equal, round, and reactive to light. No scleral icterus.  Neck: Normal range of motion. Neck supple.  Cardiovascular: Normal rate and regular rhythm.   Respiratory: Effort normal and breath sounds normal. No stridor. No respiratory distress. She has no wheezes.  GI: Soft. Bowel sounds are normal. She exhibits distension. There is no tenderness.  Musculoskeletal: She exhibits edema and tenderness.  2-3+ edema left thigh--has difficulty mobilizing LLE. Left anterior hip  incision healing well with steri-strips in place.   Neurological: She is alert and oriented to person, place, and time.  Soft voice with occasional dysphonia. Able to follow commands without difficulty.   Motor: B/l UE: 4+/5 proximal to distal RLE: Hip flexion 2+/5, knee extension 3/5, ankle dorsi/plantar flexion 5/5 LLE: Hip flexion 2+/5, knee extension 3/5, ankle dorsi/plantar flexion 5/5 Skin: Skin is warm and dry.  Presacral area with bogginess (edema) and tenderness  Psychiatric: Her mood appears anxious. She is slowed. Cognition and memory are normal.    Results for orders placed or performed during the hospital encounter of 12/25/15 (from the past 48 hour(s))  Glucose, capillary     Status: Abnormal   Collection Time: 01/13/16  4:23 PM  Result Value Ref Range   Glucose-Capillary 122 (H) 65 - 99 mg/dL   Comment 1 Repeat Test    Comment 2 Document in Chart   Glucose, capillary     Status: Abnormal   Collection Time: 01/13/16  9:10 PM  Result Value Ref Range   Glucose-Capillary 100 (H) 65 - 99 mg/dL  Magnesium     Status: None   Collection Time: 01/14/16  4:05 AM  Result Value Ref Range   Magnesium 2.3 1.7 - 2.4 mg/dL  CBC with Differential/Platelet     Status: Abnormal   Collection Time: 01/14/16  4:05 AM  Result Value Ref Range   WBC 9.6 4.0 - 10.5 K/uL   RBC 3.09 (L) 3.87 - 5.11 MIL/uL   Hemoglobin 9.6 (L) 12.0 -  15.0 g/dL   HCT 30.3 (L) 36.0 - 46.0 %   MCV 98.1 78.0 - 100.0 fL   MCH 31.1 26.0 - 34.0 pg   MCHC 31.7 30.0 - 36.0 g/dL   RDW 18.2 (H) 11.5 - 15.5 %   Platelets 297 150 - 400 K/uL   Neutrophils Relative % 81 %   Neutro Abs 7.8 (H) 1.7 - 7.7 K/uL   Lymphocytes Relative 11 %   Lymphs Abs 1.1 0.7 - 4.0 K/uL   Monocytes Relative 7 %   Monocytes Absolute 0.7 0.1 - 1.0 K/uL   Eosinophils Relative 1 %   Eosinophils Absolute 0.1 0.0 - 0.7 K/uL   Basophils Relative 0 %   Basophils Absolute 0.0 0.0 - 0.1 K/uL  Comprehensive metabolic panel     Status: Abnormal    Collection Time: 01/14/16  4:05 AM  Result Value Ref Range   Sodium 138 135 - 145 mmol/L   Potassium 3.7 3.5 - 5.1 mmol/L   Chloride 108 101 - 111 mmol/L   CO2 23 22 - 32 mmol/L   Glucose, Bld 114 (H) 65 - 99 mg/dL   BUN 15 6 - 20 mg/dL   Creatinine, Ser 0.49 0.44 - 1.00 mg/dL   Calcium 8.9 8.9 - 10.3 mg/dL   Total Protein 4.9 (L) 6.5 - 8.1 g/dL   Albumin 2.3 (L) 3.5 - 5.0 g/dL   AST 55 (H) 15 - 41 U/L   ALT 69 (H) 14 - 54 U/L   Alkaline Phosphatase 80 38 - 126 U/L   Total Bilirubin 0.9 0.3 - 1.2 mg/dL   GFR calc non Af Amer >60 >60 mL/min   GFR calc Af Amer >60 >60 mL/min    Comment: (NOTE) The eGFR has been calculated using the CKD EPI equation. This calculation has not been validated in all clinical situations. eGFR's persistently <60 mL/min signify possible Chronic Kidney Disease.    Anion gap 7 5 - 15  Glucose, capillary     Status: Abnormal   Collection Time: 01/14/16  6:26 AM  Result Value Ref Range   Glucose-Capillary 116 (H) 65 - 99 mg/dL  Glucose, capillary     Status: Abnormal   Collection Time: 01/14/16 11:22 AM  Result Value Ref Range   Glucose-Capillary 132 (H) 65 - 99 mg/dL   Comment 1 Notify RN    Comment 2 Document in Chart   Glucose, capillary     Status: Abnormal   Collection Time: 01/14/16  4:46 PM  Result Value Ref Range   Glucose-Capillary 106 (H) 65 - 99 mg/dL   Comment 1 Notify RN    Comment 2 Document in Chart   Glucose, capillary     Status: Abnormal   Collection Time: 01/14/16 10:07 PM  Result Value Ref Range   Glucose-Capillary 114 (H) 65 - 99 mg/dL  Basic metabolic panel     Status: Abnormal   Collection Time: 01/15/16  3:50 AM  Result Value Ref Range   Sodium 138 135 - 145 mmol/L   Potassium 3.5 3.5 - 5.1 mmol/L   Chloride 108 101 - 111 mmol/L   CO2 24 22 - 32 mmol/L   Glucose, Bld 114 (H) 65 - 99 mg/dL   BUN 15 6 - 20 mg/dL   Creatinine, Ser 0.42 (L) 0.44 - 1.00 mg/dL   Calcium 8.7 (L) 8.9 - 10.3 mg/dL   GFR calc non Af Amer  >60 >60 mL/min   GFR calc Af Amer >60 >60  mL/min    Comment: (NOTE) The eGFR has been calculated using the CKD EPI equation. This calculation has not been validated in all clinical situations. eGFR's persistently <60 mL/min signify possible Chronic Kidney Disease.    Anion gap 6 5 - 15  Phosphorus     Status: None   Collection Time: 01/15/16  3:50 AM  Result Value Ref Range   Phosphorus 3.4 2.5 - 4.6 mg/dL  CBC     Status: Abnormal   Collection Time: 01/15/16  3:50 AM  Result Value Ref Range   WBC 9.5 4.0 - 10.5 K/uL   RBC 3.01 (L) 3.87 - 5.11 MIL/uL   Hemoglobin 9.3 (L) 12.0 - 15.0 g/dL   HCT 18.6 (L) 72.9 - 26.7 %   MCV 98.7 78.0 - 100.0 fL   MCH 30.9 26.0 - 34.0 pg   MCHC 31.3 30.0 - 36.0 g/dL   RDW 28.2 (H) 05.4 - 10.9 %   Platelets 283 150 - 400 K/uL  Glucose, capillary     Status: Abnormal   Collection Time: 01/15/16  6:13 AM  Result Value Ref Range   Glucose-Capillary 126 (H) 65 - 99 mg/dL   No results found.   Medical Problem List and Plan: 1.  Abnormality of gait, weakness, low endurance secondary to debility after left hip fracture complicated by arterial bleed, hemorrhagic shock, bilateral PE, PEA,  Atrial thrombus, GIB. medical issues. 2.  Bilateral PE/DVT Prophylaxis/Anticoagulation: Mechanical: Sequential compression devices, below knee Bilateral lower extremities. IVC filter in place.  3. Pain Management: Will schedule tylenol.  Tramadol added for prn use.  4. Mood: Showing activation and humor. LCSW to follow for evaluation and support.  5. Neuropsych: This patient is capable of making decisions on her own behalf. 6. Skin/Wound Care: Routine pressure relief measures. Monitor incision daily for healing.  7. Fluids/Electrolytes/Nutrition: Monitor I/O. Check lytes in am.  8. RA thrombus: Resolved post thrombolysis  9. PAF: In NSR on cardizem. Monitor HR bid.  10 GIB:H/H stable. Continue PPI. Will continue to monitor for recurrent melena or hematochezia. 11.  ABLA:  Has been treated with multiple units PRBC post arterial hemorrhage/GIB.   12. Dysphagia: Tolerating diet advancement. Po intake improving 13. Low back pain: Will check lumbar films to rule out pathology--chronic back pain reported by husband. Add kpad for local measures.   Post Admission Physician Evaluation: 1. Functional deficits secondary  to debility after left hip fracture complicated by arterial bleed, hemorrhagic shock, bilateral PE, PEA,  Atrial thrombus, GIB. medical issues. 2. Patient is admitted to receive collaborative, interdisciplinary care between the physiatrist, rehab nursing staff, and therapy team. 3. Patient's level of medical complexity and substantial therapy needs in context of that medical necessity cannot be provided at a lesser intensity of care such as a SNF. 4. Patient has experienced substantial functional loss from his/her baseline which was documented above under the "Functional History" and "Functional Status" headings.  Judging by the patient's diagnosis, physical exam, and functional history, the patient has potential for functional progress which will result in measurable gains while on inpatient rehab.  These gains will be of substantial and practical use upon discharge  in facilitating mobility and self-care at the household level. 5. Physiatrist will provide 24 hour management of medical needs as well as oversight of the therapy plan/treatment and provide guidance as appropriate regarding the interaction of the two. 6. 24 hour rehab nursing will assist with bladder management, bowel management, safety, skin/wound care, disease management, medication administration,  pain management and patient education and help integrate therapy concepts, techniques,education, etc. 7. PT will assess and treat for/with: Lower extremity strength, range of motion, stamina, balance, functional mobility, safety, adaptive techniques and equipment, woundcare, coping skills, pain  control, education.   Goals are: Min A/Supervision. 8. OT will assess and treat for/with: ADL's, functional mobility, safety, upper extremity strength, adaptive techniques and equipment, wound mgt, ego support, and community reintegration.   Goals are: Min A/Supervision. Therapy may not proceed with showering this patient. 9. SLP will assess and treat for/with: swallowing.  Goals are: Mod I/Ind. 10. Case Management and Social Worker will assess and treat for psychological issues and discharge planning. 11. Team conference will be held weekly to assess progress toward goals and to determine barriers to discharge. 12. Patient will receive at least 3 hours of therapy per day at least 5 days per week. 13. ELOS: 18-22 days. 14. Prognosis:  good  Delice Lesch, MD 01/15/2016

## 2016-01-16 NOTE — Progress Notes (Signed)
Meredith Staggers, MD Physician Signed Physical Medicine and Rehabilitation Consult Note 01/08/2016 5:15 PM  Related encounter: ED to Hosp-Admission (Discharged) from 12/25/2015 in [place] Collapse All        Physical Medicine and Rehabilitation Consult  Reason for Consult: Debility due to multiple medical issues after hip fracture. Referring Physician: Dr. Sherral Hammers   HPI: Bonnie Salazar is a 75 y.o. female admitted on 12/25/15 after a fall with subsequent left femoral neck fracture. She underwent left anterior hip arthroplasty by Dr. Fredonia Highland. Post op course complicated by abdominal distension with hypotension due to bleeding from distal iliac artery. She underwent distal external iliac artery stent down to inguinal ligament. She was treated with 11u PRBC, 7 units FFP, 2 units PLT pheresis and cryoprecipitate for life threatening bleed. Dr. Hulen Skains consulted and recommended monitoring. She tolerated extubation on 06/17 and ortho recommended WBAT with ASA for DVT prophylaxis. BLE dopplers 6/17 were negative for DVT. 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 She developed respiratory distress with marked tachycardia on 06/18 requiring face mask. CTA chest done showing bilateral PE with completely occlusive thrombus in LLL PA with nearly occlusive segmental pulmonary arteries and right heart strain. Dr. Cyndia Bent consulted and following for input. She underwent catheter lysis of intra-atrial clot by Dr. Earleen Newport.   Post procedure developed leg, back and abdominal pain with hypotension requiring neo and was transfused with 2 units PRBC. She was sedated and intubated for airway protection. She developed PEA cardiac arrest later that evening requiring 10 minutes of resuscitation. CTA abdomen/pelvis showed decrease in extraperitoneal bleed,  compression of left external iliac and comon iliac veins by pelvic hematoma, no new pelvic hematoma and enlarging hematoma proximal left thigh. CT head without acute changes. Heparin was discontinued and IVC filter placed on 06/21. Has been treated with antibiotics for fevers as well as IV diuresis for fluid overload. She tolerated extubation on 06/23 and continues to be NPO due to high aspiration risk. MBS done yesterday revealing oropharyngeal dysphagia with delay in swallow, moderate residue, fatigue and aspiration. She developed bloody stools on 06/26 and GI consulted for input. Dr. Silverio Decamp questioned ischemic colitis v/s hemorrhoids as cause of bleeding and supportive care recommended. She continues to have hematochezia and leucocytosis but serial H/H relatively stable. Therapy evaluations done this week and CIR recommended due to debilitated state.   Husband in the room with congested cough frequently. He reports that she was active and walked 1-2 miles daily. Supportive    Review of Systems  Unable to perform ROS: language  Eyes: Negative for blurred vision.  Respiratory: Negative for cough and shortness of breath.  Cardiovascular: Positive for chest pain. Negative for leg swelling.  Gastrointestinal: Negative for abdominal pain.   Abdominal distension/pain decreasing  Musculoskeletal: Positive for back pain and joint pain.  Neurological: Positive for weakness. Negative for headaches.      Past Medical History  Diagnosis Date  . Medical history non-contributory   . Acute pulmonary embolism ([place]) 12/28/2015    Past Surgical History  Procedure Laterality Date  . Back surgery    . 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  Hodgkin's lymphoma  Mother   . Stroke Father   . Coronary artery disease Father     in his late 88s    Social History: Married. Independent without AD PTA. Per reports that she has quit smoking. She quit smokeless tobacco use about 20 years ago. Per reports that she does not drink alcohol or use illicit drugs.    Allergies: No Known Allergies    Medications Prior to Admission  Medication Sig Dispense Refill  . CALCIUM PO Take 2 tablets by mouth daily.    . chlorhexidine (PERIDEX) 0.12 % solution 15 mLs by Mouth Rinse route 2 (two) times daily.    . Cholecalciferol (VITAMIN D PO) Take 1 capsule by mouth daily.    . fluticasone (FLONASE) 50 MCG/ACT nasal spray Place 2 sprays into the nose daily as needed for allergies.     Marland Kitchen GINKGO BILOBA PO Take 1 capsule by mouth daily.    . Misc Natural Products (GLUCOSAMINE CHONDROITIN TRIPLE) TABS Take 2 tablets by mouth daily.    . Multiple Vitamin (MULTIVITAMIN WITH MINERALS) TABS tablet Take 1 tablet by mouth daily.    . Multiple Vitamins-Minerals (ICAPS AREDS 2) CAPS Take 1 capsule by mouth 2 (two) times daily.    . naproxen sodium (ANAPROX) 220 MG tablet Take 220 mg by mouth at bedtime as needed (pain).    . Omega-3 Fatty Acids (OMEGA 3 PO) Take 1 capsule by mouth daily.      Home: Home Living Family/patient expects to be discharged to:: Inpatient rehab Living Arrangements: Spouse/significant other (retired) Available Help at Discharge: Family, Available 24 hours/day Additional Comments: Difficult hx due to low tone of voice and slow processing.  Functional History: Prior Function Level of Independence: Independent Comments: per pt she did not use a RW or cane PTA Functional Status:  Mobility: Bed Mobility Overal bed mobility: Needs Assistance Bed Mobility: Supine to Sit Supine to sit: +2 for physical assistance, Min assist Sit to supine: Max assist General bed mobility comments:  Able to initiate movement of Bil LEs but requires assist to get them to EOB, assist to elevate trunk and to scoot to EOB.  Transfers Overall transfer level: Needs assistance Equipment used: Rolling walker (2 wheeled) Transfers: Sit to/from Stand Sit to Stand: Mod assist, +2 physical assistance Stand pivot transfers: Mod assist, +2 safety/equipment General transfer comment: Assist to boost and steady w/ cues for hand placement and technique. Pt able to correct posterior bias and flexed posture this session w/ verbal and tactile cues. Pt able to manage RW w/ pivot. Ambulation/Gait Ambulation/Gait assistance: Mod assist, +2 safety/equipment Ambulation Distance (Feet): 10 Feet Assistive device: Rolling walker (2 wheeled) Gait Pattern/deviations: Shuffle, Decreased stride length, Trunk flexed, Narrow base of support General Gait Details: Pt shuffles feet and requires assist to steady due to posterior bias. Moves slowly but pt motivated, taking much effort to ambulate 10 ft. Close chair follow for safety as pt fatigues quickly. Gait velocity interpretation: <1.8 ft/sec, indicative of risk for recurrent falls    ADL: ADL Overall ADL's : Needs assistance/impaired Eating/Feeding: NPO Grooming: Wash/dry hands, Wash/dry face, Oral care, Total assistance, Sitting Grooming Details (indicate cue type and reason): requires hand over hand assist  Upper Body Bathing: Total assistance, Bed level, Sitting Lower Body Bathing: Total assistance, Sit to/from stand Upper Body Dressing : Total assistance, Sitting Lower Body Dressing: Total assistance, Bed level, Sit to/from stand Toilet Transfer: Maximal assistance, +2 for physical assistance, Stand-pivot, BSC, RW Toileting- Clothing Manipulation and  Hygiene: Total assistance, Sit to/from stand Functional mobility during ADLs: Maximal assistance, +2 for physical assistance, Rolling walker General ADL Comments: Pt requires assist due to generalized weakness     Cognition: Cognition Overall Cognitive Status: Impaired/Different from baseline Orientation Level: Oriented to person, Oriented to place, Disoriented to situation Cognition Arousal/Alertness: Awake/alert Behavior During Therapy: Flat affect Overall Cognitive Status: Impaired/Different from baseline Area of Impairment: Attention, Following commands, Problem solving Current Attention Level: Sustained Memory: Decreased short-term memory Following Commands: Follows one step commands inconsistently, Follows one step commands with increased time Problem Solving: Slow processing, Decreased initiation General Comments: Appears to require significant effort to perform minimal motor movement/follow commands    Blood pressure 121/73, pulse 98, temperature 98.6 F (37 C), temperature source Oral, resp. rate 25, height 5\' 7"  (1.702 m), weight 60.5 kg (133 lb 6.1 oz), SpO2 100 %. Physical Exam  Vitals reviewed. Constitutional: She appears well-developed and well-nourished. She appears ill. No distress. Nasal cannula in place.  Flat affect and slow to respond  HENT:  Head: Normocephalic and atraumatic.  Mouth/Throat: Oropharynx is clear and moist.  NGT  Eyes: Conjunctivae and EOM are normal. Pupils are equal, round, and reactive to light. No scleral icterus.  Dilated pupils.  Neck: Normal range of motion. Neck supple.  Cardiovascular: Normal rate and regular rhythm.  Respiratory: Effort normal. No stridor. No tachypnea. No respiratory distress. She has rales in the right upper field. She exhibits tenderness.  GI: Soft. Bowel sounds are normal. She exhibits no distension.  Musculoskeletal: She exhibits no edema or tenderness.  Neurological: She is alert.  Anxious appearing with delayed responses. needed encouragement to phonate--able to speaks in a whisper. Able to follow simple motor commands. LLE weakness due to pain inhibition. But generally with proximal > distal limb weakness.  Skin:  Skin is dry. No rash noted. She is not diaphoretic.  Psychiatric: Her speech is not delayed. She is slowed.     Lab Results Last 24 Hours    Results for orders placed or performed during the hospital encounter of 12/25/15 (from the past 24 hour(s))  Glucose, capillary Status: Abnormal   Collection Time: 01/07/16 5:19 PM  Result Value Ref Range   Glucose-Capillary 113 (H) 65 - 99 mg/dL  Glucose, capillary Status: Abnormal   Collection Time: 01/07/16 7:57 PM  Result Value Ref Range   Glucose-Capillary 140 (H) 65 - 99 mg/dL  Glucose, capillary Status: Abnormal   Collection Time: 01/08/16 12:21 AM  Result Value Ref Range   Glucose-Capillary 202 (H) 65 - 99 mg/dL   Comment 1 Notify RN   Glucose, capillary Status: Abnormal   Collection Time: 01/08/16 4:04 AM  Result Value Ref Range   Glucose-Capillary 121 (H) 65 - 99 mg/dL   Comment 1 Notify RN   CBC Status: Abnormal   Collection Time: 01/08/16 5:19 AM  Result Value Ref Range   WBC 16.1 (H) 4.0 - 10.5 K/uL   RBC 3.59 (L) 3.87 - 5.11 MIL/uL   Hemoglobin 10.8 (L) 12.0 - 15.0 g/dL   HCT 36.3 36.0 - 46.0 %   MCV 101.1 (H) 78.0 - 100.0 fL   MCH 30.1 26.0 - 34.0 pg   MCHC 29.8 (L) 30.0 - 36.0 g/dL   RDW 19.8 (H) 11.5 - 15.5 %   Platelets 316 150 - 400 K/uL  Comprehensive metabolic panel Status: Abnormal   Collection Time: 01/08/16 5:19 AM  Result Value Ref Range   Sodium 159 (H) 135 - 145 mmol/L  Potassium 3.0 (L) 3.5 - 5.1 mmol/L   Chloride 122 (H) 101 - 111 mmol/L   CO2 28 22 - 32 mmol/L   Glucose, Bld 160 (H) 65 - 99 mg/dL   BUN 58 (H) 6 - 20 mg/dL   Creatinine, Ser 0.86 0.44 - 1.00 mg/dL   Calcium 9.6 8.9 - 10.3 mg/dL   Total Protein 6.6 6.5 - 8.1 g/dL   Albumin 3.4 (L) 3.5 - 5.0 g/dL   AST 55 (H) 15 - 41 U/L   ALT 47 14 - 54 U/L   Alkaline  Phosphatase 73 38 - 126 U/L   Total Bilirubin 1.9 (H) 0.3 - 1.2 mg/dL   GFR calc non Af Amer >60 >60 mL/min   GFR calc Af Amer >60 >60 mL/min   Anion gap 9 5 - 15  Magnesium Status: Abnormal   Collection Time: 01/08/16 5:19 AM  Result Value Ref Range   Magnesium 2.6 (H) 1.7 - 2.4 mg/dL  Glucose, capillary Status: Abnormal   Collection Time: 01/08/16 8:13 AM  Result Value Ref Range   Glucose-Capillary 154 (H) 65 - 99 mg/dL  Glucose, capillary Status: Abnormal   Collection Time: 01/08/16 11:01 AM  Result Value Ref Range   Glucose-Capillary 171 (H) 65 - 99 mg/dL  Glucose, capillary Status: Abnormal   Collection Time: 01/08/16 1:55 PM  Result Value Ref Range   Glucose-Capillary 136 (H) 65 - 99 mg/dL      Imaging Results (Last 48 hours)    Dg Abd Portable 1v  01/07/2016 CLINICAL DATA: Feeding tube placement. EXAM: PORTABLE ABDOMEN - 1 VIEW COMPARISON: None. FINDINGS: Supine portable abdomen at 1626 hours shows a small bore feeding catheter with the tip at or just distal to the pylorus. There is no NG tube visible on this study. IVC filter is evident in situ. Contrast material is visualized within small bowel loops. IMPRESSION: 1. Feeding tube tip is at or just distal to the pylorus. 2. No NG tube visible on this study. Electronically Signed By: Misty Stanley M.D. On: 01/07/2016 16:34   Dg Swallowing Func-speech Pathology  01/07/2016 Objective Swallowing Evaluation: Type of Study: MBS-Modified Barium Swallow Study Patient Details Name: DANETTA ROXBURGH MRN: [id number] Date of Birth: 1940-09-12 Today's Date: 01/07/2016 Time: SLP Start Time (ACUTE ONLY): 1002-SLP Stop Time (ACUTE ONLY): 1017 SLP Time Calculation (min) (ACUTE ONLY): 15 min Past Medical History: Past Medical History Diagnosis Date . Medical history non-contributory . Acute pulmonary embolism ([place]) 12/28/2015 Past Surgical History: Past Surgical  History Procedure Laterality Date . Back surgery . 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 female without significant PMH, admitted 6/15 after sustaining a fall with L femoral neck fracture. Pt was intubated 6/16 for surgery, extubated 6/17. On 6/18 pt with sudden drop in BP/ back pain while on TPA, then PEA arrest and required resuscitation and was re-intubated, extubated 6/23. Noted to have strong cough and able to speak name. Most recent CXR showed persistent atelectasis versus consolidation LLL. Bedside swallow eval ordered due to prolonged/ multiple intubations. Subjective: pt alert, cooperative but not very communicative Assessment / Plan / Recommendation CHL IP CLINICAL IMPRESSIONS 01/07/2016 Therapy Diagnosis Moderate oral phase dysphagia;Moderate pharyngeal phase dysphagia Clinical Impression Pt has a moderate oropharyngeal dysphagia due to generalized weakness that impacts her strength and cohesion with oral and pharyngeal transit. A delay in swallow trigger results in deep  penetration before the swallow spoonfuls of nectar thick liquids and small cup sips of honey thick liquids. Mod cues provided for cough to clear, and pt did not follow commands to attempt chin tuck posture. All consistencies tested leave moderate residue at the base of tongue and valleculae that elicit spontaneous additional swallows. Despite multiple swallows per bolus, residue is reduced but not cleared from her valleculae. As trials continue, she begins to fatigue and no longer initiates volitional swallows in an attempt to further clear her pharynx. While aspiration was not observed, her risk remains high due to weakness and fatigue. Recommend to maintain NPO status except for meds crushed in puree. Will initiate therapeutic trials of puree and honey thick liquids by  spoon with SLP only. Impact on safety and function Moderate aspiration risk;Severe aspiration risk;Risk for inadequate nutrition/hydration CHL IP TREATMENT RECOMMENDATION 01/07/2016 Treatment Recommendations Therapy as outlined in treatment plan below Prognosis 01/07/2016 Prognosis for Safe Diet Advancement Good Barriers to Reach Goals -- Barriers/Prognosis Comment -- CHL IP DIET RECOMMENDATION 01/07/2016 SLP Diet Recommendations NPO except meds Liquid Administration via -- Medication Administration Crushed with puree Compensations -- Postural Changes -- CHL IP OTHER RECOMMENDATIONS 01/07/2016 Recommended Consults -- Oral Care Recommendations Oral care QID Other Recommendations Have oral suction available CHL IP FOLLOW UP RECOMMENDATIONS 01/07/2016 Follow up Recommendations Inpatient Rehab CHL IP FREQUENCY AND DURATION 01/07/2016 Speech Therapy Frequency (ACUTE ONLY) min 2x/week Treatment Duration 2 weeks CHL IP ORAL PHASE 01/07/2016 Oral Phase Impaired Oral - Pudding Teaspoon -- Oral - Pudding Cup -- Oral - Honey Teaspoon Weak lingual manipulation;Reduced posterior propulsion;Lingual/palatal residue;Delayed oral transit Oral - Honey Cup Weak lingual manipulation;Reduced posterior propulsion;Lingual/palatal residue;Delayed oral transit Oral - Nectar Teaspoon Weak lingual manipulation;Reduced posterior propulsion;Lingual/palatal residue;Delayed oral transit Oral - Nectar Cup -- Oral - Nectar Straw -- Oral - Thin Teaspoon -- Oral - Thin Cup -- Oral - Thin Straw -- Oral - Puree Weak lingual manipulation;Reduced posterior propulsion;Lingual/palatal residue;Delayed oral transit Oral - Mech Soft -- Oral - Regular -- Oral - Multi-Consistency -- Oral - Pill -- Oral Phase - Comment -- CHL IP PHARYNGEAL PHASE 01/07/2016 Pharyngeal Phase Impaired Pharyngeal- Pudding Teaspoon -- Pharyngeal -- Pharyngeal- Pudding Cup -- Pharyngeal -- Pharyngeal- Honey Teaspoon Delayed swallow initiation-pyriform sinuses;Reduced anterior  laryngeal mobility;Reduced laryngeal elevation;Reduced tongue base retraction;Pharyngeal residue - valleculae Pharyngeal -- Pharyngeal- Honey Cup Delayed swallow initiation-pyriform sinuses;Reduced anterior laryngeal mobility;Reduced laryngeal elevation;Reduced tongue base retraction;Pharyngeal residue - valleculae;Penetration/Aspiration before swallow Pharyngeal Material enters airway, remains ABOVE vocal cords and not ejected out Pharyngeal- Nectar Teaspoon Delayed swallow initiation-pyriform sinuses;Reduced anterior laryngeal mobility;Reduced laryngeal elevation;Reduced tongue base retraction;Pharyngeal residue - valleculae;Penetration/Aspiration before swallow Pharyngeal Material enters airway, CONTACTS cords and then ejected out Pharyngeal- Nectar Cup -- Pharyngeal -- Pharyngeal- Nectar Straw -- Pharyngeal -- Pharyngeal- Thin Teaspoon -- Pharyngeal -- Pharyngeal- Thin Cup -- Pharyngeal -- Pharyngeal- Thin Straw -- Pharyngeal -- Pharyngeal- Puree Delayed swallow initiation-pyriform sinuses;Reduced anterior laryngeal mobility;Reduced laryngeal elevation;Reduced tongue base retraction;Pharyngeal residue - valleculae Pharyngeal -- Pharyngeal- Mechanical Soft -- Pharyngeal -- Pharyngeal- Regular -- Pharyngeal -- Pharyngeal- Multi-consistency -- Pharyngeal -- Pharyngeal- Pill -- Pharyngeal -- Pharyngeal Comment -- CHL IP CERVICAL ESOPHAGEAL PHASE 01/07/2016 Cervical Esophageal Phase WFL Pudding Teaspoon -- Pudding Cup -- Honey Teaspoon -- Honey Cup -- Nectar Teaspoon -- Nectar Cup -- Nectar Straw -- Thin Teaspoon -- Thin Cup -- Thin Straw -- Puree -- Mechanical Soft -- Regular -- Multi-consistency -- Pill -- Cervical Esophageal Comment -- No flowsheet data found. Germain Osgood, M.A. CCC-SLP [phone] Germain Osgood 01/07/2016,  10:43 AM     Assessment/Plan: Diagnosis: Functional, mobility, and swallowing/communication deficits secondary to left femoral neck fracture and subsequent  complications 1. Does the need for close, 24 hr/day medical supervision in concert with the patient's rehab needs make it unreasonable for this patient to be served in a less intensive setting? Yes 2. Co-Morbidities requiring supervision/potential complications: PE/afib/cardiac arrest, massive blood loss 3. Due to bladder management, bowel management, safety, skin/wound care, disease management, medication administration, pain management and patient education, does the patient require 24 hr/day rehab nursing? Yes 4. Does the patient require coordinated care of a physician, rehab nurse, PT (1-2 hrs/day, 5 days/week), OT (1-2 hrs/day, 5 days/week) and SLP (1-2 hrs/day, 5 days/week) to address physical and functional deficits in the context of the above medical diagnosis(es)? Yes Addressing deficits in the following areas: balance, endurance, locomotion, strength, transferring, bowel/bladder control, bathing, dressing, feeding, grooming, toileting, speech, swallowing and psychosocial support 5. Can the patient actively participate in an intensive therapy program of at least 3 hrs of therapy per day at least 5 days per week? Yes 6. The potential for patient to make measurable gains while on inpatient rehab is excellent 7. Anticipated functional outcomes upon discharge from inpatient rehab are supervision and min assist with PT, supervision and min assist with OT, modified independent and supervision with SLP. 8. Estimated rehab length of stay to reach the above functional goals is: 18-24 days 9. Does the patient have adequate social supports and living environment to accommodate these discharge functional goals? Yes 10. Anticipated D/C setting: Home 11. Anticipated post D/C treatments: [place] therapy 12. Overall Rehab/Functional Prognosis: excellent  RECOMMENDATIONS: This patient's condition is appropriate for continued rehabilitative care in the following setting: CIR Patient has agreed to participate in  recommended program. Yes Note that insurance prior authorization may be required for reimbursement for recommended care.  Comment: Rehab Admissions Coordinator to follow up.  Thanks,  Meredith Staggers, MD, Mellody Drown     01/08/2016       Revision History     Date/Time User Provider Type Action   01/09/2016 11:27 AM Meredith Staggers, MD Physician Sign   01/09/2016 9:32 AM Bary Leriche, PA-C Physician Assistant Share   View Details Report       Routing History     Date/Time From To Method   01/09/2016 11:27 AM Meredith Staggers, MD Aletha Halim, PA-C Fax

## 2016-01-16 NOTE — Progress Notes (Signed)
Instructed patient on flutter and patient demonstrated.

## 2016-01-16 NOTE — Progress Notes (Signed)
Reviewed discharge instructions with patient and patient's husband.  Gave report to In Patient Rehab.

## 2016-01-16 NOTE — Discharge Summary (Addendum)
Physician Discharge Summary  Bonnie Salazar [id number] DOB: [birth year] DOA: 12/25/2015  PCP: [name]  Admit date: 12/25/2015 Discharge date: 01/16/2016  Admitted From: Home Disposition:  CIR  Recommendations for Outpatient Follow-up:  1. Follow up with PCP and orthopedics in 2 weeks 2. Please obtain BMP/CBC in one week 3. Monitor closely for GI bleeding  Discharge Condition: Stable CODE STATUS: Full Diet recommendation: Dysphagia 1 with thin   Brief/Interim Summary: PLEASE SEE HOSPITAL NOTES FOR FULL DETAILS OF THIS LONG ADMISSION.   75 y.o. WF without significant past medical history, sustained an accidental fall at home. With left femoral neck fracture, She was taken to the OR for operative repair 6/16. The initial repair appeared uneventful and she was extubated in the PACU. Shortly thereafter, she complained of abdominal pain and was noted to have a tense abdomen. CT scan showed extravasation into the LLQ; she was taken to IR and underwent covered stent of the distal external iliac artery to the inguinal ligament. Vascular surgery was also consulted. No signs of distal ischemia on his exam. There is some concern for abdominal compartment syndrome, as well. She received multiple units of pRBC, FFP, PLT pheresis, and cryoprecipitate.Returned to ICU on mechanical ventilation.She suffered a PEA arrest 6/18. ICU course complicated by further GI bleeding.IVC filter placed.   Left femoral neck fracture after accidental fall - S/P repair by orthopedic 6/16. WBAT - Out of bed to chair q shift - PT/OT evaluation recommending CIR. - Discontinued Robaxin given patient reports she is forming 40 after starting these medications, will try to manage with Tylenol, and when necessary Vicodin before PT, as discussed with patient and husband. - Orthopedics recommending Aspirin for DVT prophylaxis after discharge.   Positive Acute bilateral PE-->RV strain  - due to probable  propagation from right pelvic vein where might have formed in the setting of compression from hematoma - Venous Doppler lower extremity with no evidence of DVT on 6/17 - patient is s/p thrombolysis per IR  - Status post IVC filter placement by IR  Acute Hypoxic Respiratory Failure  - Secondary to bilateral PE : Resolved - O2 sat has been >95% on RA   PEA Arrest on 6/18(~10 minutes)/Troponin elevation - Occurred Post thrombolysis  - Most likely secondary demand ischemia   Paroxysmal atrial fibrillation/severe MR - All anticoagulants on hold secondary to patient's multiple sites of bleeding. - Review chart appears heparin drip stopped on 6/21 - Sever mitral regurgitation resolved on repeat echo 6/19 - HR well controlled. Continue Cardizem 30 mg QID and switch Metoprolol 25 mg BID to PO  RA thrombus - likely propagated from right pelvic vein compression from hematoma-->migrated and causing BILATERAL PE - S/P Thrombolysis by IR - RA thrombus no longer present on repeat echo 6/19 - IVC filter in place  Arterial hemorrhage s/p stenting and requiring massive transfusion (external iliac artery) - See significant events - 6/16 transfuse to 14 units PRBC, 8 units FFP, 2 units cryoprecipitate, 2 units platelets - 6/18 transfused 2 units PRBC, 8 units FFP - 6/21 transfused 2 units PRBC - H/H stable, continue to monitor.   Acute Blood Loss Anemia   - Review chart; appears patient last received heparin on 6/21 - 6/27 Overnight patient with significant melena/diarrhea - Switch Protonix 40 mg BID to PO. H/H stable.  - GI does not believe Ischemic colitis, recommends watchful waiting. No new episodes of melena.   Hypovolemic/hemorrhagic Shock  - resolved 6/21  Hypokalemia - Repleted, continue to monitor  Hypernatremia  - Resolved, continue to monitor closely  Protein Calorie Malnutrition - Initially on tube feeds, has been discontinued after she passed swallow evaluation, to  poor by mouth intake, encouraged to drink ensure at least 3 times.  Dysphagia - 6/30 failed swallow study continue nothing by mouth, repeat study 7/3 showed improvement, changed diet to dysphagia 1 with thin liquids  Diarrhea - resolved - Most likely secondary to tube feeds. Patient afebrile. Diarrhea now resolved  Mild metabolic acidosis - resolved - Possibly combination of GI loss and malnutrition - Continue to monitor    DVT prophylaxis: SCD/IVC filter, orthopedics recommended aspirin after discharge Code Status: Full Family Communication: Husband at bedside Disposition Plan: CIR when medically stable.  Consultants:   Orthopedics surgery  General surgery  IR  Cardiology  Procedures:   6/16 TOTAL HIP ARTHROPLASTY;Lt ext iliac artery bleed/pelvic hematoma  6/16 CT pelvis with contrast;Active bleeding in the pelvis. Originating fm Proximal left common femoral artery/distal left external iliac artery. Bleeding near origin of the left inferior epigastric artery.-Large amount of blood and hematoma formation throughout the abdomen and pelvis.  6/16 left lower extremity angiogram; emergent covered stent embolization of distal external iliac artery branch vessel  pseudoaneurysm/avulsion, with placement of stent graft 9mm x 5cm viabahn  ECHO 6/17 >> LVEF= 50-55%; - Mitral valve: Moderate prolapse w/ severe regurgitation. - Left atrium: mildly dilated.- Right ventricle: mildly dilated. - Right atrium: large mobile mass in the right atrium that prolapses toward the mitral valve.  CT chest 6/17 >> Bilateral pulmonary emboli, nearly occlusive in the left lower lobe segmental branches, involving all of the segmental pulmonary arteries. Evidence of right heart strain with right/left heart ratio> 1. -Heterogeneous area of hypoattenuation within the right atrium may represent right atrial clot versus mixing of non-opacified blood from the inferior vena cava. Bilateral pleural effusions  and atelectatic changes of bilateral lung bases.  LE [place] 6/17 >> negative for DVT  ECHO 6/18 >> LVEF 55-60%, normal wall motion, moderate MVP, mod to severe MR, RA mildly dilated, RA with large moblie pedunculated and irregular mass that prolapses through the TV c/w thrombus  CT ABD/Pelvis 6/18 >> no evidence of new intraperitoneal or pelvic hemorrhage, increasing hematoma in left proximal thigh, decreasing volume of pulmonary embolus in the bilateral lower lobes  6/18 catheter directed TPA for right atrial thrombus  6/18; PEA arrest  CT Head 6/18 >> No CT evidence of acute intracranial abnormality  ECHO 6/19 >> LVEF 50-55%, normal wall motion, no RWMA, previous RA mass no longer demonstrated on this ECHO, mild increase in PA systolic pressure  99991111 infrarenal IVC filter placement  7/2 moved to floor  7/7 TRANSFER TO CIR  Antimicrobials:  Perioperative Ancef  6/20 Zosyn >>> 6/26  6/21 Vancomycin >> 6/23  Discharge Diagnoses:  Principal Problem:   Fracture of femoral neck, left ([place]) Active Problems:   Hip fracture ([place])   Leukocytosis   Dehydration   Atrial thrombus ([facility])   Acute pulmonary embolism ([facility])   Acute respiratory failure ([place])   Hemorrhagic shock   Cardiac arrest ([place])   Acute pulmonary embolus ([facility])   Atrial mass   Bleeding   Pulmonary embolus, left (H[facility])   Pulmonary embolus, right (HCC)   Acute respiratory failure with hypoxia (HCC)   PEA (Pulseless electrical activity) (HCC)   Right atrial thrombus (H[facility])   Arterial hemorrhage   Hypovolemic shock ([facility])   Hypokalemia   Hypernatremia   Gastrointestinal hemorrhage with melena   Blood  per rectum   Hemorrhage   Female pelvic hematoma  Discharge Instructions  Discharge Instructions    Increase activity slowly    Complete by:  As directed             Medication List    STOP taking these medications        CALCIUM PO     chlorhexidine 0.12 % solution  Commonly known as:  PERIDEX      GINKGO BILOBA PO     Glucosamine Chondroitin Triple Tabs     ICAPS AREDS 2 Caps     naproxen sodium 220 MG tablet  Commonly known as:  ANAPROX     OMEGA 3 PO      TAKE these medications        acetaminophen 325 MG tablet  Commonly known as:  TYLENOL  Take 2 tablets (650 mg total) by mouth every 6 (six) hours as needed for mild pain.     aspirin EC 81 MG tablet  Take 1 tablet (81 mg total) by mouth daily.  Start taking on:  01/19/2016     diltiazem 30 MG tablet  Commonly known as:  CARDIZEM  Take 1 tablet (30 mg total) by mouth every 6 (six) hours.     feeding supplement (ENSURE ENLIVE) Liqd  Take 237 mLs by mouth 3 (three) times daily between meals.     fluticasone 50 MCG/ACT nasal spray  Commonly known as:  FLONASE  Place 2 sprays into the nose daily as needed for allergies.     HYDROcodone-acetaminophen 5-325 MG tablet  Commonly known as:  NORCO/VICODIN  Take 1 tablet by mouth every 6 (six) hours as needed for severe pain (before PT).     metoprolol tartrate 25 MG tablet  Commonly known as:  LOPRESSOR  Take 1 tablet (25 mg total) by mouth 2 (two) times daily.     multivitamin with minerals Tabs tablet  Take 1 tablet by mouth daily.     pantoprazole 40 MG tablet  Commonly known as:  PROTONIX  Take 1 tablet (40 mg total) by mouth daily.     VITAMIN D PO  Take 1 capsule by mouth daily.           Follow-up Information    Follow up with MURPHY, TIMOTHY D, MD. Schedule an appointment as soon as possible for a visit in 2 weeks.   Specialty:  Orthopedic Surgery   Why:  Hospital Follow Up   Contact information:   [address] [place] [address] [phone]       Follow up with KAPLAN,KRISTEN, PA-C. Schedule an appointment as soon as possible for a visit in 2 weeks.   Specialty:  Family Medicine   Why:  Hospital Follow Up   Contact information:   [address] [place] [address] [phone]      No Known  Allergies   Procedures/Studies: Dg Chest 1 View  12/25/2015  CLINICAL DATA:  Recent fall today with left hip pain, initial encounter EXAM: CHEST 1 VIEW COMPARISON:  None. FINDINGS: Cardiac shadow is within normal limits. The lungs are well aerated bilaterally. Mild interstitial changes are seen without focal infiltrate. No acute bony abnormality is seen. IMPRESSION: Mild interstitial changes likely of a chronic nature. No acute abnormality seen. Electronically Signed   By: Inez Catalina M.D.   On: 12/25/2015 21:17   Dg Lumbar Spine Complete  12/25/2015  CLINICAL DATA:  Fall today with low back  pain, initial encounter EXAM: LUMBAR SPINE - COMPLETE 4+ VIEW COMPARISON:  None. FINDINGS: Five lumbar type vertebral bodies are well visualized. A mild scoliosis concave to the right is noted. Reactive endplate changes are noted. No anterolisthesis is seen. No soft tissue abnormality is noted. IMPRESSION: Degenerative change without acute abnormality. Electronically Signed   By: Inez Catalina M.D.   On: 12/25/2015 21:17   Ct Head Wo Contrast  12/28/2015  CLINICAL DATA:  75 year old female with acute respiratory distress during venous thrombolysis. EXAM: CT HEAD WITHOUT CONTRAST TECHNIQUE: Contiguous axial images were obtained from the base of the skull through the vertex without intravenous contrast. COMPARISON:  CT 08/20/2002 FINDINGS: Note that there is venous contamination given the recent abdominal CT. No large acute intracranial hemorrhage. No midline shift or mass effect. Ventricular system unremarkable. Gray-white differentiation maintained. No significant white matter disease. Hyperdense mass along the inner table of the left frontal bone measures 5 mm x 10 mm, compatible with a small meningioma without local mass effect. This was identified (by report) on the CT of 08/20/2002. Unremarkable appearance of the calvarium without acute fracture or aggressive lesion. Unremarkable appearance of the scalp soft  tissues. Unremarkable appearance of the bilateral orbits. Mastoid air cells are clear. No significant paranasal sinus disease IMPRESSION: No CT evidence of acute intracranial abnormality. 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 Signed, Dulcy Fanny. Earleen Newport, DO [facility] Radiology Electronically Signed   By: Corrie Mckusick D.O.   On: 12/28/2015 21:28   Ct Angio Chest Pe W Or Wo Contrast  12/27/2015  CLINICAL DATA:  Shortness of breath. Atrial mass versus blood clot seen on cardiac echo. EXAM: CT ANGIOGRAPHY CHEST WITH CONTRAST TECHNIQUE: Multidetector CT imaging of the chest was performed using the standard protocol during bolus administration of intravenous contrast. Multiplanar CT image reconstructions and MIPs were obtained to evaluate the vascular anatomy. CONTRAST:  80 cc Isovue 370 intravenously. COMPARISON:  Chest radiograph 12/26/2015 FINDINGS: Mediastinum/Lymph Nodes: Left subclavian approach central venous catheter terminates in the superior vena cava. There are bilateral pulmonary emboli. There is a nearly completely occlusive thrombus within the left lower lobar pulmonary artery, with clot extending to the basilar segmental branches. Nonocclusive clot is seen within the left upper, lingular lobar pulmonary artery, right upper, right middle and right lower lobe pulmonary arteries. There is evidence of right heart strain with right/left ventricular ratio over 1. Irregular hypoattenuated area within the right atrium may represent mixing of non-opacified blood from the inferior vena cava with contrast opacified blood, versus an intramural thrombus. No masses or pathologically enlarged lymph nodes identified. Lungs/Pleura: There are bilateral moderate in size pleural effusions. There is segmental atelectasis of the right lower lobe and subsegmental atelectasis of the left lower lobe. Upper abdomen: Water density  abdominal ascites. Musculoskeletal: No chest wall mass or suspicious bone lesions identified. Review of the MIP images confirms the above findings. IMPRESSION: Bilateral pulmonary emboli, nearly occlusive in the left lower lobe segmental branches, involving all of the segmental pulmonary arteries. Evidence of right heart strain with right/left heart ratio of over 1. Heterogeneous area of hypoattenuation within the right atrium may represent right atrial clot versus mixing of non-opacified blood from the inferior vena cava. Given presence of extensive bilateral pulmonary emboli, presence of atrial clot becomes more plausible. If clinically important, gated cardiac CT or cardiac MRI may be pursued. Bilateral pleural effusions and atelectatic changes of bilateral lung bases. Abdominal  ascites. Critical Value/emergent results were called by telephone at the time of interpretation on 12/27/2015 at 7:06 pm to Dr. Halford Chessman, who verbally acknowledged these results. Electronically Signed   By: Fidela Salisbury M.D.   On: 12/27/2015 19:19   Ct Pelvis W Contrast  12/26/2015  CLINICAL DATA:  75 year old with large pelvic hematoma following left hip replacement. Evaluate for pelvic bleeding. EXAM: CT PELVIS WITH CONTRAST TECHNIQUE: Multidetector CT imaging of the pelvis was performed using the standard protocol following the bolus administration of intravenous contrast. CONTRAST:  100 mL Isovue COMPARISON:  None. FINDINGS: Vascular structures: The distal abdominal aorta and common iliac arteries are patent. There is active contrast extravasation in the left hemipelvis originating from the proximal left common femoral artery or distal left external iliac artery. Bleeding is originating near the origin of the left inferior epigastric artery but may be separate from this vessel. The left common femoral artery and the proximal left femoral arteries are patent. The right iliac arteries and right femoral arteries are patent. The  delayed images demonstrate additional contrast extravasation within the large hematoma within the anterior pelvis and lower abdomen. There is fluid tracking into the left abdomen. There is fluid and gas tracking up the left iliacus muscle. Expected gas around the left hip from the recent hip replacement. Fluid in the pelvis. Limited evaluation of pelvic structures due to the large amount of blood. The left hip arthroplasty is located. 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 IMPRESSION: Active bleeding in the pelvis. The bleeding is originating from the proximal left common femoral artery or the distal left external iliac artery. The bleeding is near the origin of the left inferior epigastric artery. Large amount of blood and hematoma formation throughout the abdomen and pelvis. Expected postsurgical changes from left hip arthroplasty. Electronically Signed   By: Markus Daft M.D.   On: 12/26/2015 18:55   Ir Angiogram Extremity Left  12/26/2015  INDICATION: 75 year old female with a history of a hip arthroplasty and hypotension. EXAM: IR EMBO ART VEN HEMORR LYMPH EXTRAV INC GUIDE ROADMAPPING; IR ULTRASOUND GUIDANCE VASC ACCESS RIGHT; ARTERIOGRAPHY; PELVIC SELECTIVE ARTERIOGRAPHY; LEFT EXTREMITY ARTERIOGRAPHY; ADDITIONAL ARTERIOGRAPHY MEDICATIONS: None ANESTHESIA/SEDATION: Patient was intubated. Anesthesia team managed the endotracheal airway CONTRAST:  50 cc Isovue FLUOROSCOPY TIME:  Fluoroscopy Time: 5 minutes 42 seconds COMPLICATIONS: None PROCEDURE: Informed consent was obtained from the patient following explanation of the procedure, risks, benefits and alternatives. The patient understands, agrees and consents for the procedure. All questions were addressed. A time out was performed prior to the initiation of the procedure. Maximal barrier sterile technique utilized including caps, mask, sterile gowns, sterile gloves, large sterile drape, hand hygiene, and Betadine prep. Patient positioned supine  position on the fluoroscopy table. The right inguinal region was prepped and draped in the usual sterile fashion. Ultrasound survey of the right inguinal region was performed with images stored and sent to PACs. A micropuncture needle was used access the right common femoral artery under ultrasound. With excellent arterial blood flow returned, and an .018 micro wire was passed through the needle, observed enter the abdominal aorta under fluoroscopy. The needle was removed, and a micropuncture sheath was placed over the wire. The inner dilator and wire were removed, and an 035 Bentson wire was advanced under fluoroscopy into the abdominal aorta. The sheath was removed and a standard 5 [place] vascular sheath was placed. The dilator was removed and the sheath was flushed. Omni Flush catheter was used to navigate a Bentson wire over  the aortic bifurcation into the left iliac system. Catheter was exchanged for a C2 catheter advanced into the hypogastric artery with an angiogram performed. Catheter was withdrawn and redirected into the external iliac artery. Angiogram was performed. A Rosen wire was advanced into the proximal femoral system and the catheter was withdrawn. Six French right tip sheath was advanced over the bifurcation into the left iliac system. 7 mm x 4 cm balloon was advanced over the Rosen wire into the proximal left external iliac artery. Balloon was gently inflated to low atmospheric pressure, with inflation of approximately 4 minutes. At this time, measurements of the left external iliac artery were performed. Balloon was deflated, the balloon catheter was advanced into the proximal left femoral system, and the 035 Rosen wire was exchanged for an 018 S-V8 wire. Balloon catheter was removed. A 51mm x 38mm Viabahn stent graft was select thinned and advanced over the 018 wire to the targeted the avulsed artery. Stent graft was deployed. Balloon angioplasty to the nominal diameter of 6 mm was performed.  Final angiogram was performed. The 6 French 35 cm right tip sheath was exchanged for a standard 10 cm 6 French sheath. Sheath was elected to remain given the patient's coagulopathy. Patient tolerated the procedure well with no significant blood loss. IMPRESSION: Status post left lower extremity angiogram with emergent covered stent embolization of distal external iliac artery branch vessel pseudoaneurysm/avulsion, with placement of stent graft. Signed, Dulcy Fanny. Earleen Newport, DO [facility] Radiology Electronically Signed   By: Corrie Mckusick D.O.   On: 12/26/2015 22:45   Ir Angiogram Pelvis Selective Or Supraselective  12/26/2015  INDICATION: 75 year old female with a history of a hip arthroplasty and hypotension. EXAM: IR EMBO ART VEN HEMORR LYMPH EXTRAV INC GUIDE ROADMAPPING; IR ULTRASOUND GUIDANCE VASC ACCESS RIGHT; ARTERIOGRAPHY; PELVIC SELECTIVE ARTERIOGRAPHY; LEFT EXTREMITY ARTERIOGRAPHY; ADDITIONAL ARTERIOGRAPHY MEDICATIONS: None ANESTHESIA/SEDATION: Patient was intubated. Anesthesia team managed the endotracheal airway CONTRAST:  50 cc Isovue FLUOROSCOPY TIME:  Fluoroscopy Time: 5 minutes 42 seconds COMPLICATIONS: None PROCEDURE: Informed consent was obtained from the patient following explanation of the procedure, risks, benefits and alternatives. The patient understands, agrees and consents for the procedure. All questions were addressed. A time out was performed prior to the initiation of the procedure. Maximal barrier sterile technique utilized including caps, mask, sterile gowns, sterile gloves, large sterile drape, hand hygiene, and Betadine prep. Patient positioned supine position on the fluoroscopy table. The right inguinal region was prepped and draped in the usual sterile fashion. Ultrasound survey of the right inguinal region was performed with images stored and sent to PACs. A micropuncture needle was used access the right common femoral artery under  ultrasound. With excellent arterial blood flow returned, and an .018 micro wire was passed through the needle, observed enter the abdominal aorta under fluoroscopy. The needle was removed, and a micropuncture sheath was placed over the wire. The inner dilator and wire were removed, and an 035 Bentson wire was advanced under fluoroscopy into the abdominal aorta. The sheath was removed and a standard 5 [place] vascular sheath was placed. The dilator was removed and the sheath was flushed. Omni Flush catheter was used to navigate a Bentson wire over the aortic bifurcation into the left iliac system. Catheter was exchanged for a C2 catheter advanced into the hypogastric artery with an angiogram performed. Catheter was withdrawn and redirected into the external iliac artery. Angiogram was performed. A Rosen wire was advanced into the proximal femoral system and the  catheter was withdrawn. Six French right tip sheath was advanced over the bifurcation into the left iliac system. 7 mm x 4 cm balloon was advanced over the Rosen wire into the proximal left external iliac artery. Balloon was gently inflated to low atmospheric pressure, with inflation of approximately 4 minutes. At this time, measurements of the left external iliac artery were performed. Balloon was deflated, the balloon catheter was advanced into the proximal left femoral system, and the 035 Rosen wire was exchanged for an 018 S-V8 wire. Balloon catheter was removed. A 44mm x 5mm Viabahn stent graft was select thinned and advanced over the 018 wire to the targeted the avulsed artery. Stent graft was deployed. Balloon angioplasty to the nominal diameter of 6 mm was performed. Final angiogram was performed. The 6 French 35 cm right tip sheath was exchanged for a standard 10 cm 6 French sheath. Sheath was elected to remain given the patient's coagulopathy. Patient tolerated the procedure well with no significant blood loss. IMPRESSION: Status post left lower  extremity angiogram with emergent covered stent embolization of distal external iliac artery branch vessel pseudoaneurysm/avulsion, with placement of stent graft. Signed, Dulcy Fanny. Earleen Newport, DO [facility] Radiology Electronically Signed   By: Corrie Mckusick D.O.   On: 12/26/2015 22:45   Ir Angiogram Selective Each Additional Vessel  12/26/2015  INDICATION: 75 year old female with a history of a hip arthroplasty and hypotension. EXAM: IR EMBO ART VEN HEMORR LYMPH EXTRAV INC GUIDE ROADMAPPING; IR ULTRASOUND GUIDANCE VASC ACCESS RIGHT; ARTERIOGRAPHY; PELVIC SELECTIVE ARTERIOGRAPHY; LEFT EXTREMITY ARTERIOGRAPHY; ADDITIONAL ARTERIOGRAPHY MEDICATIONS: None ANESTHESIA/SEDATION: Patient was intubated. Anesthesia team managed the endotracheal airway CONTRAST:  50 cc Isovue FLUOROSCOPY TIME:  Fluoroscopy Time: 5 minutes 42 seconds COMPLICATIONS: None PROCEDURE: Informed consent was obtained from the patient following explanation of the procedure, risks, benefits and alternatives. The patient understands, agrees and consents for the procedure. All questions were addressed. A time out was performed prior to the initiation of the procedure. Maximal barrier sterile technique utilized including caps, mask, sterile gowns, sterile gloves, large sterile drape, hand hygiene, and Betadine prep. Patient positioned supine position on the fluoroscopy table. The right inguinal region was prepped and draped in the usual sterile fashion. Ultrasound survey of the right inguinal region was performed with images stored and sent to PACs. A micropuncture needle was used access the right common femoral artery under ultrasound. With excellent arterial blood flow returned, and an .018 micro wire was passed through the needle, observed enter the abdominal aorta under fluoroscopy. The needle was removed, and a micropuncture sheath was placed over the wire. The inner dilator and wire were removed, and an  035 Bentson wire was advanced under fluoroscopy into the abdominal aorta. The sheath was removed and a standard 5 [place] vascular sheath was placed. The dilator was removed and the sheath was flushed. Omni Flush catheter was used to navigate a Bentson wire over the aortic bifurcation into the left iliac system. Catheter was exchanged for a C2 catheter advanced into the hypogastric artery with an angiogram performed. Catheter was withdrawn and redirected into the external iliac artery. Angiogram was performed. A Rosen wire was advanced into the proximal femoral system and the catheter was withdrawn. Six French right tip sheath was advanced over the bifurcation into the left iliac system. 7 mm x 4 cm balloon was advanced over the Rosen wire into the proximal left external iliac artery. Balloon was gently inflated to low atmospheric pressure, with inflation of approximately  4 minutes. At this time, measurements of the left external iliac artery were performed. Balloon was deflated, the balloon catheter was advanced into the proximal left femoral system, and the 035 Rosen wire was exchanged for an 018 S-V8 wire. Balloon catheter was removed. A 69mm x 32mm Viabahn stent graft was select thinned and advanced over the 018 wire to the targeted the avulsed artery. Stent graft was deployed. Balloon angioplasty to the nominal diameter of 6 mm was performed. Final angiogram was performed. The 6 French 35 cm right tip sheath was exchanged for a standard 10 cm 6 French sheath. Sheath was elected to remain given the patient's coagulopathy. Patient tolerated the procedure well with no significant blood loss. IMPRESSION: Status post left lower extremity angiogram with emergent covered stent embolization of distal external iliac artery branch vessel pseudoaneurysm/avulsion, with placement of stent graft. Signed, Dulcy Fanny. Earleen Newport, DO [facility] Radiology Electronically Signed   By: Corrie Mckusick D.O.   On: 12/26/2015 22:45   Ir Mariana Arn Ivc  12/28/2015  INDICATION: 75 year old female, status post postoperative hemorrhage left external iliac artery branch vessel, with embolization for hemostasis. Subsequent development of respiratory decompensation with cardiac echo demonstrating giant atrial thrombus. Surgical embolectomy carries a high morbidity/ mortality given the patient's current ICU status. Alternative treatment options include catheter embolectomy/thrombectomy, as well as catheter directed thrombolysis. Family has elected to proceed with least invasive therapy. EXAM: IR INFUSION THROMBOL ARTERIAL INITIAL (MS); IR ULTRASOUND GUIDANCE VASC ACCESS RIGHT; INFERIOR VENA CAVOGRAM COMPARISON:  CT pelvis 12/26/2015 CT chest 12/27/2015 Cardiac ECHO 12/27/2015 and 12/28/2015 MEDICATIONS: None. ANESTHESIA/SEDATION: Versed 0.5 mg IV; Fentanyl 0 mcg IV No moderate sedation The patient was continuously monitored during the procedure by the interventional radiology nurse under my direct supervision. FLUOROSCOPY TIME:  Fluoroscopy Time: 0 minutes 36 seconds (58 mGy). COMPLICATIONS: None TECHNIQUE: Informed written consent was obtained from the patient and the patient's husband after a thorough discussion of the procedural risks, benefits and alternatives. All questions were addressed. Maximal Sterile Barrier Technique was utilized including caps, mask, sterile gowns, sterile gloves, sterile drape, hand hygiene and skin antiseptic. A timeout was performed prior to the initiation of the procedure. Patient positioned supine position on the fluoroscopy table. The right inguinal region was prepped and draped in the usual sterile fashion. Ultrasound survey of the right inguinal region was performed with images stored and sent to PACs. A single wall 19 gauge needle was used access the right common femoral vein under ultrasound. With excellent venous blood flow returned, an 035 Bentson wire was passed through  the needle, observed enter the abdominal IVC under fluoroscopy. The needle was removed, and a standard 5 [place] vascular sheath was placed. The dilator was removed and the sheath was flushed. Limited venogram was then performed of the right iliac vein and the IVC. A stiff Glidewire was then advanced to the inferior cavoatrial junction. A 20 cm infusion length Uni fuse catheter was then placed at the suprarenal IVC, with the tip terminating in the inferior right atrium. Final image was stored. Patient tolerated the procedure well and remained hemodynamically unchanged. No blood loss. FINDINGS: Ultrasound survey demonstrates patent right common femoral vein. Limited venogram of the right iliac vein and IVC demonstrates no thrombus of the visualized right iliac system. Venous reflux into the left iliac system with slow flow through the IVC. No occlusive thrombus of the infrarenal or suprarenal IVC. There is poor definition of the known right atrial thrombus given the  low volume injection. IMPRESSION: Status post right common femoral vein access for placement of thrombolysis catheter and initiation of pharmacologic thrombolysis to treat known right atrial thrombus. Signed, Dulcy Fanny. Earleen Newport, DO [facility] Radiology Electronically Signed   By: Corrie Mckusick D.O.   On: 12/28/2015 16:56   Ir Angiogram Follow Up Study  12/26/2015  INDICATION: 75 year old female with a history of a hip arthroplasty and hypotension. EXAM: IR EMBO ART VEN HEMORR LYMPH EXTRAV INC GUIDE ROADMAPPING; IR ULTRASOUND GUIDANCE VASC ACCESS RIGHT; ARTERIOGRAPHY; PELVIC SELECTIVE ARTERIOGRAPHY; LEFT EXTREMITY ARTERIOGRAPHY; ADDITIONAL ARTERIOGRAPHY MEDICATIONS: None ANESTHESIA/SEDATION: Patient was intubated. Anesthesia team managed the endotracheal airway CONTRAST:  50 cc Isovue FLUOROSCOPY TIME:  Fluoroscopy Time: 5 minutes 42 seconds COMPLICATIONS: None PROCEDURE: Informed consent was obtained from  the patient following explanation of the procedure, risks, benefits and alternatives. The patient understands, agrees and consents for the procedure. All questions were addressed. A time out was performed prior to the initiation of the procedure. Maximal barrier sterile technique utilized including caps, mask, sterile gowns, sterile gloves, large sterile drape, hand hygiene, and Betadine prep. Patient positioned supine position on the fluoroscopy table. The right inguinal region was prepped and draped in the usual sterile fashion. Ultrasound survey of the right inguinal region was performed with images stored and sent to PACs. A micropuncture needle was used access the right common femoral artery under ultrasound. With excellent arterial blood flow returned, and an .018 micro wire was passed through the needle, observed enter the abdominal aorta under fluoroscopy. The needle was removed, and a micropuncture sheath was placed over the wire. The inner dilator and wire were removed, and an 035 Bentson wire was advanced under fluoroscopy into the abdominal aorta. The sheath was removed and a standard 5 [place] vascular sheath was placed. The dilator was removed and the sheath was flushed. Omni Flush catheter was used to navigate a Bentson wire over the aortic bifurcation into the left iliac system. Catheter was exchanged for a C2 catheter advanced into the hypogastric artery with an angiogram performed. Catheter was withdrawn and redirected into the external iliac artery. Angiogram was performed. A Rosen wire was advanced into the proximal femoral system and the catheter was withdrawn. Six French right tip sheath was advanced over the bifurcation into the left iliac system. 7 mm x 4 cm balloon was advanced over the Rosen wire into the proximal left external iliac artery. Balloon was gently inflated to low atmospheric pressure, with inflation of approximately 4 minutes. At this time, measurements of the left external  iliac artery were performed. Balloon was deflated, the balloon catheter was advanced into the proximal left femoral system, and the 035 Rosen wire was exchanged for an 018 S-V8 wire. Balloon catheter was removed. A 36mm x 58mm Viabahn stent graft was select thinned and advanced over the 018 wire to the targeted the avulsed artery. Stent graft was deployed. Balloon angioplasty to the nominal diameter of 6 mm was performed. Final angiogram was performed. The 6 French 35 cm right tip sheath was exchanged for a standard 10 cm 6 French sheath. Sheath was elected to remain given the patient's coagulopathy. Patient tolerated the procedure well with no significant blood loss. IMPRESSION: Status post left lower extremity angiogram with emergent covered stent embolization of distal external iliac artery branch vessel pseudoaneurysm/avulsion, with placement of stent graft. Signed, Dulcy Fanny. Earleen Newport, DO [facility] Radiology Electronically Signed   By: Corrie Mckusick D.O.  On: 12/26/2015 22:45   Ir Ivc Filter Plmt / S&i /img Guid/mod Sed  12/31/2015  INDICATION: Pulmonary thromboembolism.  Internal hemorrhage. EXAM: IVC FILTER,INFERIOR VENA CAVOGRAM MEDICATIONS: None. ANESTHESIA/SEDATION: Patient was sedated by the Critical Care team FLUOROSCOPY TIME:  Fluoroscopy Time: 1 minutes 12 seconds (53 mGy). COMPLICATIONS: None immediate. PROCEDURE: Informed written consent was obtained from the patient after a thorough discussion of the procedural risks, benefits and alternatives. All questions were addressed. Maximal Sterile Barrier Technique was utilized including caps, mask, sterile gowns, sterile gloves, sterile drape, hand hygiene and skin antiseptic. A timeout was performed prior to the initiation of the procedure. The right neck was prepped with Betadine in a sterile fashion, and a sterile drape was applied covering the operative field. A sterile gown and sterile gloves were used  for the procedure. The right jugular vein was noted to be patent initially with ultrasound. Under sonographic guidance, a micropuncture needle was inserted into the right jugular vein (Ultrasound image documentation was performed). It was removed over an 018 wire which was up-sized to a [place]. The sheath was inserted over the wire and into the IVC. IVC venography was performed. The temporary filter was then deployed in the infrarenal IVC. The sheath was removed and hemostasis was achieved with direct pressure. FINDINGS: Venography confirms patency of the IVC without venous anomaly Final image demonstrates placement of an IVC filter with its tip at the L2-3 disc. IMPRESSION: Successful infrarenal IVC filter placement. This is a temporary filter. It can be removed or remain in place to become permanent. Electronically Signed   By: Marybelle Killings M.D.   On: 12/31/2015 15:22   Ir US Guide Vasc Access Right  12/28/2015  INDICATION: 75 year old female, status post postoperative hemorrhage left external iliac artery branch vessel, with embolization for hemostasis. Subsequent development of respiratory decompensation with cardiac echo demonstrating giant atrial thrombus. Surgical embolectomy carries a high morbidity/ mortality given the patient's current ICU status. Alternative treatment options include catheter embolectomy/thrombectomy, as well as catheter directed thrombolysis. Family has elected to proceed with least invasive therapy. EXAM: IR INFUSION THROMBOL ARTERIAL INITIAL (MS); IR ULTRASOUND GUIDANCE VASC ACCESS RIGHT; INFERIOR VENA CAVOGRAM COMPARISON:  CT pelvis 12/26/2015 CT chest 12/27/2015 Cardiac ECHO 12/27/2015 and 12/28/2015 MEDICATIONS: None. ANESTHESIA/SEDATION: Versed 0.5 mg IV; Fentanyl 0 mcg IV No moderate sedation The patient was continuously monitored during the procedure by the interventional radiology nurse under my direct supervision. FLUOROSCOPY TIME:  Fluoroscopy Time: 0 minutes 36 seconds (58  mGy). COMPLICATIONS: None TECHNIQUE: Informed written consent was obtained from the patient and the patient's husband after a thorough discussion of the procedural risks, benefits and alternatives. All questions were addressed. Maximal Sterile Barrier Technique was utilized including caps, mask, sterile gowns, sterile gloves, sterile drape, hand hygiene and skin antiseptic. A timeout was performed prior to the initiation of the procedure. Patient positioned supine position on the fluoroscopy table. The right inguinal region was prepped and draped in the usual sterile fashion. Ultrasound survey of the right inguinal region was performed with images stored and sent to PACs. A single wall 19 gauge needle was used access the right common femoral vein under ultrasound. With excellent venous blood flow returned, an 035 Bentson wire was passed through the needle, observed enter the abdominal IVC under fluoroscopy. The needle was removed, and a standard 5 [place] vascular sheath was placed. The dilator was removed and the sheath was flushed. Limited venogram was then performed of the right iliac vein and the IVC. A stiff  Glidewire was then advanced to the inferior cavoatrial junction. A 20 cm infusion length Uni fuse catheter was then placed at the suprarenal IVC, with the tip terminating in the inferior right atrium. Final image was stored. Patient tolerated the procedure well and remained hemodynamically unchanged. No blood loss. FINDINGS: Ultrasound survey demonstrates patent right common femoral vein. Limited venogram of the right iliac vein and IVC demonstrates no thrombus of the visualized right iliac system. Venous reflux into the left iliac system with slow flow through the IVC. No occlusive thrombus of the infrarenal or suprarenal IVC. There is poor definition of the known right atrial thrombus given the low volume injection. IMPRESSION: Status post right common femoral vein access for placement of thrombolysis  catheter and initiation of pharmacologic thrombolysis to treat known right atrial thrombus. Signed, Dulcy Fanny. Earleen Newport, DO [facility] Radiology Electronically Signed   By: Corrie Mckusick D.O.   On: 12/28/2015 16:56   Ir US Guide Vasc Access Right  12/26/2015  INDICATION: 75 year old female with a history of a hip arthroplasty and hypotension. EXAM: IR EMBO ART VEN HEMORR LYMPH EXTRAV INC GUIDE ROADMAPPING; IR ULTRASOUND GUIDANCE VASC ACCESS RIGHT; ARTERIOGRAPHY; PELVIC SELECTIVE ARTERIOGRAPHY; LEFT EXTREMITY ARTERIOGRAPHY; ADDITIONAL ARTERIOGRAPHY MEDICATIONS: None ANESTHESIA/SEDATION: Patient was intubated. Anesthesia team managed the endotracheal airway CONTRAST:  50 cc Isovue FLUOROSCOPY TIME:  Fluoroscopy Time: 5 minutes 42 seconds COMPLICATIONS: None PROCEDURE: Informed consent was obtained from the patient following explanation of the procedure, risks, benefits and alternatives. The patient understands, agrees and consents for the procedure. All questions were addressed. A time out was performed prior to the initiation of the procedure. Maximal barrier sterile technique utilized including caps, mask, sterile gowns, sterile gloves, large sterile drape, hand hygiene, and Betadine prep. Patient positioned supine position on the fluoroscopy table. The right inguinal region was prepped and draped in the usual sterile fashion. Ultrasound survey of the right inguinal region was performed with images stored and sent to PACs. A micropuncture needle was used access the right common femoral artery under ultrasound. With excellent arterial blood flow returned, and an .018 micro wire was passed through the needle, observed enter the abdominal aorta under fluoroscopy. The needle was removed, and a micropuncture sheath was placed over the wire. The inner dilator and wire were removed, and an 035 Bentson wire was advanced under fluoroscopy into the abdominal aorta. The sheath  was removed and a standard 5 [place] vascular sheath was placed. The dilator was removed and the sheath was flushed. Omni Flush catheter was used to navigate a Bentson wire over the aortic bifurcation into the left iliac system. Catheter was exchanged for a C2 catheter advanced into the hypogastric artery with an angiogram performed. Catheter was withdrawn and redirected into the external iliac artery. Angiogram was performed. A Rosen wire was advanced into the proximal femoral system and the catheter was withdrawn. Six French right tip sheath was advanced over the bifurcation into the left iliac system. 7 mm x 4 cm balloon was advanced over the Rosen wire into the proximal left external iliac artery. Balloon was gently inflated to low atmospheric pressure, with inflation of approximately 4 minutes. At this time, measurements of the left external iliac artery were performed. Balloon was deflated, the balloon catheter was advanced into the proximal left femoral system, and the 035 Rosen wire was exchanged for an 018 S-V8 wire. Balloon catheter was removed. A 49mm x 28mm Viabahn stent graft was select thinned and advanced over the 018  wire to the targeted the avulsed artery. Stent graft was deployed. Balloon angioplasty to the nominal diameter of 6 mm was performed. Final angiogram was performed. The 6 French 35 cm right tip sheath was exchanged for a standard 10 cm 6 French sheath. Sheath was elected to remain given the patient's coagulopathy. Patient tolerated the procedure well with no significant blood loss. IMPRESSION: Status post left lower extremity angiogram with emergent covered stent embolization of distal external iliac artery branch vessel pseudoaneurysm/avulsion, with placement of stent graft. Signed, Dulcy Fanny. Earleen Newport, DO [facility] Radiology Electronically Signed   By: Corrie Mckusick D.O.   On: 12/26/2015 22:45   Dg Chest Port 1 View  01/05/2016  CLINICAL  DATA:  Acute respiratory failure EXAM: PORTABLE CHEST 1 VIEW COMPARISON:  01/04/2016 FINDINGS: NG tube has been placed with the tip in the proximal stomach. Left Central line is unchanged. Heart is normal size. Left lower lobe atelectasis, improved since prior study. No confluent opacity on the right. No visible effusions. IMPRESSION: Improving left basilar atelectasis. Electronically Signed   By: Rolm Baptise M.D.   On: 01/05/2016 07:09   Dg Chest Port 1 View  01/04/2016  CLINICAL DATA:  Acute respiratory failure EXAM: PORTABLE CHEST 1 VIEW COMPARISON:  Portable exam 0444 hours compared 01/03/2016 FINDINGS: RIGHT subclavian central venous catheter with tip projecting over SVC. Borderline enlargement of cardiac silhouette. Mediastinal contours and pulmonary vascularity normal. Slight rotation to the RIGHT. Atelectasis versus consolidation no fluid. Remaining lungs clear. No pleural effusion or pneumothorax. IMPRESSION: Persistent atelectasis versus consolidation LEFT lower lobe. Electronically Signed   By: Lavonia Dana M.D.   On: 01/04/2016 08:34   Dg Chest Port 1 View  01/03/2016  CLINICAL DATA:  History of endotracheal tube placement. History of bilateral pulmonary emboli. EXAM: PORTABLE CHEST 1 VIEW COMPARISON:  Chest x-ray dated 01/02/2016 and chest CTA dated 12/27/2015. FINDINGS: Endotracheal tube has been removed. Left subclavian central line remains adequately positioned with tip at the level of the mid SVC. Cardiomediastinal silhouette is stable. Mild edema and/or atelectasis at each lung base. Suspect trace pleural effusions bilaterally. No new lung findings seen. IMPRESSION: Endotracheal tube has been removed. Otherwise stable chest x-ray. Probable mild edema and/or atelectasis at each lung base. Probable trace pleural effusions bilaterally. Electronically Signed   By: Franki Cabot M.D.   On: 01/03/2016 10:34   Dg Chest Port 1 View  01/02/2016  CLINICAL DATA:  Respiratory failure and cardiac  arrest. EXAM: PORTABLE CHEST 1 VIEW COMPARISON:  01/01/2016 FINDINGS: Endotracheal tube tip is approximately 5 cm above the carina. Left central line tip remains in the SVC. Lungs show some improvement and bibasilar aeration with diminished appearance of pleural effusions. No overt edema, focal airspace consolidation or pneumothorax identified. The heart size is normal. IMPRESSION: Improved aeration of both lungs with decreased prominence of pleural effusions. Electronically Signed   By: Aletta Edouard M.D.   On: 01/02/2016 07:37   Dg Chest Port 1 View  01/01/2016  CLINICAL DATA:  Shortness of breath. EXAM: PORTABLE CHEST 1 VIEW COMPARISON:  12/31/2015. FINDINGS: Endotracheal tube, NG tube, left subclavian line in stable position. Heart size stable. Diffuse bilateral pulmonary infiltrates consistent with pulmonary edema again noted. Bilateral pleural effusions again noted. Changes of CHF has slightly worsened from prior exam. No pneumothorax. IMPRESSION: 1. Lines and tubes stable position. 2. Congestive heart failure bilateral pulmonary edema and bilateral pleural effusions again noted. Slight worsening from prior exam . Electronically Signed  By: [place]   On: 01/01/2016 07:07   Dg Chest Port 1 View  12/31/2015  CLINICAL DATA:  Acute respiratory failure. EXAM: PORTABLE CHEST 1 VIEW COMPARISON:  12/30/2015. FINDINGS: Endotracheal tube and NG tube in stable position. Left subclavian line stable position. Stable cardiomegaly. Diffuse bilateral pulmonary infiltrates/ edema with slight interim improvement. Persistent bilateral effusions noted also with slight improvement. No pneumothorax . IMPRESSION: 1. Lines and tubes stable position. 2. Cardiomegaly with diffuse bilateral pulmonary infiltrates consistent with pulmonary edema. Slight interim improvement. Bilateral pleural effusions. Slight interim improvement . Electronically Signed   By: [place]   On: 12/31/2015 06:57   Dg Chest Port 1  View  12/30/2015  CLINICAL DATA:  Shortness of breath. EXAM: PORTABLE CHEST 1 VIEW COMPARISON:  12/29/2015. FINDINGS: Endotracheal tube, NG tube, left subclavian line stable position. Cardiomegaly with diffuse bilateral pulmonary infiltrates pleural effusions consistent with congestive heart failure. No pneumothorax. IMPRESSION: 1. Lines and tubes stable position. 2. Cardiomegaly with diffuse bilateral pulmonary infiltrates and bilateral pleural effusions consistent with congestive heart failure. Electronically Signed   By: Marcello Moores  Register   On: 12/30/2015 06:48   Dg Chest Port 1 View  12/29/2015  CLINICAL DATA:  75 year old female with a history of left hip replacement, postoperative hemorrhage and subsequent embolization, and development of pulmonary emboli. 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 EXAM: PORTABLE CHEST 1 VIEW COMPARISON:  Multiple prior FINDINGS: Cardiomediastinal silhouette unchanged in size and contour. Similar position of defibrillator pads on the chest wall. Unchanged position of the endotracheal tube, which terminates suitably above the carina approximately 3.4 cm. Gastric tube unchanged projecting over the mediastinum and terminating and out of the field of view. Unchanged position of left subclavian central catheter which terminates in the superior vena cava. Opacities at the bilateral lung bases partially obscuring the hemidiaphragms and the heart borders. Veiled opacity in the lower lungs. Mixed interstitial and airspace opacities in the mid and lower lobes. No pneumothorax. No displaced rib fracture. IMPRESSION: Opacities the bilateral lung bases, likely a combination of pleural effusion and atelectasis/ consolidation. Unchanged position of support apparatus, including defibrillator pads, gastric tube, left subclavian central venous catheter, and endotracheal tube which terminates suitably above the carina. Signed, Dulcy Fanny. Earleen Newport,  DO [facility] Radiology Electronically Signed   By: Corrie Mckusick D.O.   On: 12/29/2015 07:05   Dg Chest Port 1 View  12/28/2015  CLINICAL DATA:  Evaluate endotracheal and orogastric tube placement. Acute respiratory failure. EXAM: PORTABLE CHEST 1 VIEW COMPARISON:  Chest radiograph December 28, 2015 at 0916 hours FINDINGS: Endotracheal tube tip projects 4.8 cm above the carina. Nasogastric tube past proximal stomach, not imaged. Nasogastric tube side port projects in proximal stomach. Stable appearance of LEFT subclavian central venous catheter with distal tip projecting in mid superior vena cava. Similar moderate RIGHT pleural effusion. Bibasilar strandy densities. Biapical pleural capping. No pneumothorax. Soft tissue planes and included osseous structures are nonsuspicious. IMPRESSION: Endotracheal tube tip projects 4.8 cm above the carina. Nasogastric tube past proximal stomach. No change in LEFT subclavian central venous catheter. Moderate RIGHT pleural effusion with bibasilar atelectasis. Electronically Signed   By: Elon Alas M.D.   On: 12/28/2015 19:38   Dg Chest Port 1 View  12/28/2015  CLINICAL DATA:  Hypoxia.  Recent diagnosis of pulmonary embolism. EXAM: PORTABLE CHEST 1 VIEW COMPARISON:  Chest CT 12/27/2015. FINDINGS: Central venous catheter tip projects over the superior vena cava.  Monitoring leads project over the patient. Stable cardiac and mediastinal contours. Moderate layering bilateral pleural effusions and underlying opacities within the mid lower lungs bilaterally. No pneumothorax. IMPRESSION: Moderate layering bilateral pleural effusions with underlying opacities which may represent atelectasis. Electronically Signed   By: Lovey Newcomer M.D.   On: 12/28/2015 10:53   Dg Chest Port 1 View  12/26/2015  CLINICAL DATA:  Patient status post ET tube placement. EXAM: PORTABLE CHEST 1 VIEW COMPARISON:  Chest radiograph 12/25/2015. FINDINGS:  ET tube terminates in the mid trachea. Left subclavian central venous catheter tip projects over the superior vena cava. Enteric tube tip and side port terminates in the midesophagus, recommend advancement. Stable cardiac and mediastinal contours. Low lung volumes. Elevation of the right hemidiaphragm. Right-greater-than-left interstitial pulmonary opacities. No large pleural effusion or pneumothorax. IMPRESSION: ET tube tip and side port terminate in the midesophagus, recommend advancement. Bilateral, left-greater-than-right, interstitial opacities which may represent pulmonary edema. These results will be called to the ordering clinician or representative by the Radiologist Assistant, and communication documented in the PACS or zVision Dashboard. Electronically Signed   By: Lovey Newcomer M.D.   On: 12/26/2015 21:07   Dg Abd Portable 1v  01/07/2016  CLINICAL DATA:  Feeding tube placement. EXAM: PORTABLE ABDOMEN - 1 VIEW COMPARISON:  None. FINDINGS: Supine portable abdomen at 1626 hours shows a small bore feeding catheter with the tip at or just distal to the pylorus. There is no NG tube visible on this study. IVC filter is evident in situ. Contrast material is visualized within small bowel loops. IMPRESSION: 1. Feeding tube tip is at or just distal to the pylorus. 2. No NG tube visible on this study. Electronically Signed   By: Misty Stanley M.D.   On: 01/07/2016 16:34   Dg Abd Portable 1v  01/04/2016  CLINICAL DATA:  OG tube placement. EXAM: PORTABLE ABDOMEN - 1 VIEW COMPARISON:  None. FINDINGS: OG tube appears adequately positioned in the stomach, with proximal side holes located approximately 4 cm below the level of the gastroesophageal junction. Visualized bowel gas pattern is nonobstructive. No evidence of free intraperitoneal air seen. IVC filter in place with tip at the L3 vertebral body level. IMPRESSION: OG tube adequately positioned in the stomach. Electronically Signed   By: Franki Cabot M.D.   On:  01/04/2016 12:09   Dg Swallowing Func-speech Pathology  01/12/2016  Objective Swallowing Evaluation: Type of Study: MBS-Modified Barium Swallow Study Patient Details Name: Bonnie Salazar MRN: [id number] Date of Birth: 1940-12-15 Today's Date: 01/12/2016 Time: SLP Start Time (ACUTE ONLY): 1116-SLP Stop Time (ACUTE ONLY): 1133 SLP Time Calculation (min) (ACUTE ONLY): 17 min Past Medical History: Past Medical History Diagnosis Date . Medical history non-contributory  . Acute pulmonary embolism ([place]) 12/28/2015 Past Surgical History: Past Surgical History Procedure Laterality Date . Back surgery   . 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 female without significant PMH, admitted 6/15 after sustaining a fall with L femoral neck fracture. Pt was intubated 6/16 for surgery, extubated 6/17. On 6/18 pt with sudden drop in BP/ back pain while on TPA, then PEA arrest and required resuscitation and was re-intubated, extubated 6/23. Noted to have strong cough and able to speak name. Most recent CXR showed persistent atelectasis versus consolidation LLL. Bedside swallow eval ordered due to prolonged/ multiple intubations. MBS 6/28  revealed moderate dysphagia with frank penetration of puree and honey-thick consistencies.  Recs for continued NPO; trial POs with SLP.  Subjective: pt alert, cooperative but not very communicative Assessment / Plan / Recommendation CHL IP CLINICAL IMPRESSIONS 01/12/2016 Therapy Diagnosis Mild oral phase dysphagia;Moderate oral phase dysphagia;Mild pharyngeal phase dysphagia;Moderate pharyngeal phase dysphagia Clinical Impression Pt shows some improved strength since previous MBS with mildly less residue that also clears more with Mod cues for second swallow. Thicker textures and solids leave more residue, but thin liquids are silently  penetrated before the swallow due to delay in swallow trigger. Penetration is shallow and in small volumes, but is silent and requires cues for stronger volitional cough to clear. She protects her airway better with even large boluses of nectar thick liquids, which also leave less residue than the honey thick liquids. Recommend to initiate Dys 1 diet and nectar thick liquids with use of second swallows and alternating solids/liquids. Take frequent rest breaks for energy conservation.  Impact on safety and function Mild aspiration risk;Moderate aspiration risk   CHL IP TREATMENT RECOMMENDATION 01/12/2016 Treatment Recommendations Therapy as outlined in treatment plan below   Prognosis 01/12/2016 Prognosis for Safe Diet Advancement Good Barriers to Reach Goals -- Barriers/Prognosis Comment -- CHL IP DIET RECOMMENDATION 01/12/2016 SLP Diet Recommendations Dysphagia 1 (Puree) solids;Nectar thick liquid Liquid Administration via Cup;Straw Medication Administration Crushed with puree Compensations Slow rate;Small sips/bites;Minimize environmental distractions;Multiple dry swallows after each bite/sip;Follow solids with liquid Postural Changes Remain semi-upright after after feeds/meals (Comment);Seated upright at 90 degrees   CHL IP OTHER RECOMMENDATIONS 01/12/2016 Recommended Consults -- Oral Care Recommendations Oral care BID Other Recommendations Order thickener from pharmacy;Prohibited food (jello, ice cream, thin soups);Remove water pitcher   CHL IP FOLLOW UP RECOMMENDATIONS 01/12/2016 Follow up Recommendations Inpatient Rehab   CHL IP FREQUENCY AND DURATION 01/12/2016 Speech Therapy Frequency (ACUTE ONLY) min 2x/week Treatment Duration 2 weeks      CHL IP ORAL PHASE 01/12/2016 Oral Phase Impaired Oral - Pudding Teaspoon -- Oral - Pudding Cup -- Oral - Honey Teaspoon Weak lingual manipulation;Reduced posterior propulsion;Delayed oral transit;Lingual/palatal residue Oral - Honey Cup NT Oral - Nectar Teaspoon Weak lingual  manipulation;Reduced posterior propulsion;Delayed oral transit Oral - Nectar Cup Weak lingual manipulation;Reduced posterior propulsion;Delayed oral transit Oral - Nectar Straw Weak lingual manipulation;Reduced posterior propulsion;Delayed oral transit Oral - Thin Teaspoon Weak lingual manipulation;Reduced posterior propulsion;Delayed oral transit Oral - Thin Cup -- Oral - Thin Straw -- Oral - Puree Weak lingual manipulation;Reduced posterior propulsion;Delayed oral transit;Lingual/palatal residue Oral - Mech Soft Weak lingual manipulation;Reduced posterior propulsion;Delayed oral transit;Impaired mastication;Lingual/palatal residue Oral - Regular -- Oral - Multi-Consistency -- Oral - Pill -- Oral Phase - Comment --  CHL IP PHARYNGEAL PHASE 01/12/2016 Pharyngeal Phase Impaired Pharyngeal- Pudding Teaspoon -- Pharyngeal -- Pharyngeal- Pudding Cup -- Pharyngeal -- Pharyngeal- Honey Teaspoon Delayed swallow initiation-vallecula;Reduced anterior laryngeal mobility;Reduced laryngeal elevation;Reduced tongue base retraction;Pharyngeal residue - valleculae Pharyngeal -- Pharyngeal- Honey Cup NT Pharyngeal -- Pharyngeal- Nectar Teaspoon Delayed swallow initiation-vallecula;Reduced anterior laryngeal mobility;Reduced laryngeal elevation;Reduced tongue base retraction;Pharyngeal residue - valleculae Pharyngeal Material does not enter airway Pharyngeal- Nectar Cup Delayed swallow initiation-vallecula;Reduced anterior laryngeal mobility;Reduced laryngeal elevation;Reduced tongue base retraction;Pharyngeal residue - valleculae Pharyngeal -- Pharyngeal- Nectar Straw Delayed swallow initiation-vallecula;Reduced anterior laryngeal mobility;Reduced laryngeal elevation;Reduced tongue base retraction;Pharyngeal residue - valleculae Pharyngeal -- Pharyngeal- Thin Teaspoon Reduced anterior laryngeal mobility;Reduced laryngeal elevation;Reduced tongue base retraction;Pharyngeal residue - valleculae;Delayed swallow initiation-pyriform  sinuses;Penetration/Aspiration before swallow Pharyngeal Material enters airway, remains ABOVE vocal cords and not ejected out  Pharyngeal- Thin Cup -- Pharyngeal -- Pharyngeal- Thin Straw -- Pharyngeal -- Pharyngeal- Puree Delayed swallow initiation-vallecula;Reduced anterior laryngeal mobility;Reduced laryngeal elevation;Reduced tongue base retraction;Pharyngeal residue - valleculae Pharyngeal -- Pharyngeal- Mechanical Soft Delayed swallow initiation-vallecula;Reduced anterior laryngeal mobility;Reduced laryngeal elevation;Reduced tongue base retraction;Pharyngeal residue - valleculae Pharyngeal -- Pharyngeal- Regular -- Pharyngeal -- Pharyngeal- Multi-consistency -- Pharyngeal -- Pharyngeal- Pill -- Pharyngeal -- Pharyngeal Comment --  CHL IP CERVICAL ESOPHAGEAL PHASE 01/12/2016 Cervical Esophageal Phase WFL Pudding Teaspoon -- Pudding Cup -- Honey Teaspoon -- Honey Cup -- Nectar Teaspoon -- Nectar Cup -- Nectar Straw -- Thin Teaspoon -- Thin Cup -- Thin Straw -- Puree -- Mechanical Soft -- Regular -- Multi-consistency -- Pill -- Cervical Esophageal Comment -- No flowsheet data found. Germain Osgood, M.A. CCC-SLP [phone] Germain Osgood 01/12/2016, 1:26 PM              Dg Swallowing Func-speech Pathology  01/07/2016  Objective Swallowing Evaluation: Type of Study: MBS-Modified Barium Swallow Study Patient Details Name: Bonnie Salazar MRN: [id number] Date of Birth: 23-Dec-1940 Today's Date: 01/07/2016 Time: SLP Start Time (ACUTE ONLY): 1002-SLP Stop Time (ACUTE ONLY): 1017 SLP Time Calculation (min) (ACUTE ONLY): 15 min Past Medical History: Past Medical History Diagnosis Date . Medical history non-contributory  . Acute pulmonary embolism ([place]) 12/28/2015 Past Surgical History: Past Surgical History Procedure Laterality Date . Back surgery   . 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 female without significant PMH, admitted 6/15 after sustaining a fall with L femoral neck fracture. Pt was intubated 6/16 for surgery, extubated 6/17. On 6/18 pt with sudden drop in BP/ back pain while on TPA, then PEA arrest and required resuscitation and was re-intubated, extubated 6/23. Noted to have strong cough and able to speak name. Most recent CXR showed persistent atelectasis versus consolidation LLL. Bedside swallow eval ordered due to prolonged/ multiple intubations.  Subjective: pt alert, cooperative but not very communicative Assessment / Plan / Recommendation CHL IP CLINICAL IMPRESSIONS 01/07/2016 Therapy Diagnosis Moderate oral phase dysphagia;Moderate pharyngeal phase dysphagia Clinical Impression Pt has a moderate oropharyngeal dysphagia due to generalized weakness that impacts her strength and cohesion with oral and pharyngeal transit. A delay in swallow trigger results in deep penetration before the swallow spoonfuls of nectar thick liquids and small cup sips of honey thick liquids. Mod cues provided for cough to clear, and pt did not follow commands to attempt chin tuck posture. All consistencies tested leave moderate residue at the base of tongue and valleculae that elicit spontaneous additional swallows. Despite multiple swallows per bolus, residue is reduced but not cleared from her valleculae. As trials continue, she begins to fatigue and no longer initiates volitional swallows in an attempt to further clear her pharynx. While aspiration was not observed, her risk remains high due to weakness and fatigue. Recommend to maintain NPO status except for meds crushed in puree. Will initiate therapeutic trials of puree and honey thick liquids by spoon with SLP only. Impact on safety and function Moderate aspiration risk;Severe aspiration risk;Risk for inadequate nutrition/hydration   CHL IP TREATMENT RECOMMENDATION 01/07/2016 Treatment Recommendations Therapy as  outlined in treatment plan below   Prognosis 01/07/2016 Prognosis for Safe Diet Advancement Good Barriers to Reach Goals -- Barriers/Prognosis Comment -- CHL IP DIET RECOMMENDATION 01/07/2016 SLP Diet Recommendations NPO except meds Liquid Administration via -- Medication Administration Crushed  with puree Compensations -- Postural Changes --   CHL IP OTHER RECOMMENDATIONS 01/07/2016 Recommended Consults -- Oral Care Recommendations Oral care QID Other Recommendations Have oral suction available   CHL IP FOLLOW UP RECOMMENDATIONS 01/07/2016 Follow up Recommendations Inpatient Rehab   CHL IP FREQUENCY AND DURATION 01/07/2016 Speech Therapy Frequency (ACUTE ONLY) min 2x/week Treatment Duration 2 weeks      CHL IP ORAL PHASE 01/07/2016 Oral Phase Impaired Oral - Pudding Teaspoon -- Oral - Pudding Cup -- Oral - Honey Teaspoon Weak lingual manipulation;Reduced posterior propulsion;Lingual/palatal residue;Delayed oral transit Oral - Honey Cup Weak lingual manipulation;Reduced posterior propulsion;Lingual/palatal residue;Delayed oral transit Oral - Nectar Teaspoon Weak lingual manipulation;Reduced posterior propulsion;Lingual/palatal residue;Delayed oral transit Oral - Nectar Cup -- Oral - Nectar Straw -- Oral - Thin Teaspoon -- Oral - Thin Cup -- Oral - Thin Straw -- Oral - Puree Weak lingual manipulation;Reduced posterior propulsion;Lingual/palatal residue;Delayed oral transit Oral - Mech Soft -- Oral - Regular -- Oral - Multi-Consistency -- Oral - Pill -- Oral Phase - Comment --  CHL IP PHARYNGEAL PHASE 01/07/2016 Pharyngeal Phase Impaired Pharyngeal- Pudding Teaspoon -- Pharyngeal -- Pharyngeal- Pudding Cup -- Pharyngeal -- Pharyngeal- Honey Teaspoon Delayed swallow initiation-pyriform sinuses;Reduced anterior laryngeal mobility;Reduced laryngeal elevation;Reduced tongue base retraction;Pharyngeal residue - valleculae Pharyngeal -- Pharyngeal- Honey Cup Delayed swallow initiation-pyriform sinuses;Reduced anterior laryngeal  mobility;Reduced laryngeal elevation;Reduced tongue base retraction;Pharyngeal residue - valleculae;Penetration/Aspiration before swallow Pharyngeal Material enters airway, remains ABOVE vocal cords and not ejected out Pharyngeal- Nectar Teaspoon Delayed swallow initiation-pyriform sinuses;Reduced anterior laryngeal mobility;Reduced laryngeal elevation;Reduced tongue base retraction;Pharyngeal residue - valleculae;Penetration/Aspiration before swallow Pharyngeal Material enters airway, CONTACTS cords and then ejected out Pharyngeal- Nectar Cup -- Pharyngeal -- Pharyngeal- Nectar Straw -- Pharyngeal -- Pharyngeal- Thin Teaspoon -- Pharyngeal -- Pharyngeal- Thin Cup -- Pharyngeal -- Pharyngeal- Thin Straw -- Pharyngeal -- Pharyngeal- Puree Delayed swallow initiation-pyriform sinuses;Reduced anterior laryngeal mobility;Reduced laryngeal elevation;Reduced tongue base retraction;Pharyngeal residue - valleculae Pharyngeal -- Pharyngeal- Mechanical Soft -- Pharyngeal -- Pharyngeal- Regular -- Pharyngeal -- Pharyngeal- Multi-consistency -- Pharyngeal -- Pharyngeal- Pill -- Pharyngeal -- Pharyngeal Comment --  CHL IP CERVICAL ESOPHAGEAL PHASE 01/07/2016 Cervical Esophageal Phase WFL Pudding Teaspoon -- Pudding Cup -- Honey Teaspoon -- Honey Cup -- Nectar Teaspoon -- Nectar Cup -- Nectar Straw -- Thin Teaspoon -- Thin Cup -- Thin Straw -- Puree -- Mechanical Soft -- Regular -- Multi-consistency -- Pill -- Cervical Esophageal Comment -- No flowsheet data found. Germain Osgood, M.A. CCC-SLP [phone] Germain Osgood 01/07/2016, 10:43 AM              Dg Hip Operative Unilat W Or W/o Pelvis Left  12/26/2015  CLINICAL DATA:  Left anterior hip replacement due to fracture. EXAM: OPERATIVE LEFT HIP (WITH PELVIS IF PERFORMED) 1 VIEW TECHNIQUE: Fluoroscopic spot image(s) were submitted for interpretation post-operatively. COMPARISON:  12/25/2015 FINDINGS: A total left hip arthroplasty has been performed. Alignment of the  prosthesis is grossly normal on this single view. No evidence for a periprosthetic fracture. IMPRESSION: Left hip replacement without complicating features. Electronically Signed   By: Markus Daft M.D.   On: 12/26/2015 16:34   Dg Hip Unilat With Pelvis 2-3 Views Left  12/25/2015  CLINICAL DATA:  Recent fall with hip pain, initial encounter EXAM: DG HIP (WITH OR WITHOUT PELVIS) 2-3V LEFT COMPARISON:  None. FINDINGS: There is left femoral neck fracture with impaction and angulation at the fracture site. Pelvic ring is intact. No other focal abnormality is seen. IMPRESSION: Left femoral neck fracture. Electronically Signed   By:  Inez Catalina M.D.   On: 12/25/2015 21:16   Dg Femur 1v Left  12/25/2015  CLINICAL DATA:  [place] with left hip pain. EXAM: LEFT FEMUR 1 VIEW COMPARISON:  Left hip 12/25/2015 FINDINGS: Single view of the left femur was obtained. There is a fracture involving the proximal left femur at the junction of the femoral head and neck. There is superior displacement of the left femoral neck. Findings are suggestive for a subcapital hip fracture. The mid and distal femur appear to be intact on this single view. IMPRESSION: Fracture of the proximal left femur. Findings are compatible with a subcapital hip fracture. Electronically Signed   By: Markus Daft M.D.   On: 12/25/2015 21:17   [place] Guide Roadmapping  12/26/2015  INDICATION: 75 year old female with a history of a hip arthroplasty and hypotension. EXAM: IR EMBO ART VEN HEMORR LYMPH EXTRAV INC GUIDE ROADMAPPING; IR ULTRASOUND GUIDANCE VASC ACCESS RIGHT; ARTERIOGRAPHY; PELVIC SELECTIVE ARTERIOGRAPHY; LEFT EXTREMITY ARTERIOGRAPHY; ADDITIONAL ARTERIOGRAPHY MEDICATIONS: None ANESTHESIA/SEDATION: Patient was intubated. Anesthesia team managed the endotracheal airway CONTRAST:  50 cc Isovue FLUOROSCOPY TIME:  Fluoroscopy Time: 5 minutes 42 seconds COMPLICATIONS: None PROCEDURE: Informed consent was obtained from the  patient following explanation of the procedure, risks, benefits and alternatives. The patient understands, agrees and consents for the procedure. All questions were addressed. A time out was performed prior to the initiation of the procedure. Maximal barrier sterile technique utilized including caps, mask, sterile gowns, sterile gloves, large sterile drape, hand hygiene, and Betadine prep. Patient positioned supine position on the fluoroscopy table. The right inguinal region was prepped and draped in the usual sterile fashion. Ultrasound survey of the right inguinal region was performed with images stored and sent to PACs. A micropuncture needle was used access the right common femoral artery under ultrasound. With excellent arterial blood flow returned, and an .018 micro wire was passed through the needle, observed enter the abdominal aorta under fluoroscopy. The needle was removed, and a micropuncture sheath was placed over the wire. The inner dilator and wire were removed, and an 035 Bentson wire was advanced under fluoroscopy into the abdominal aorta. The sheath was removed and a standard 5 [place] vascular sheath was placed. The dilator was removed and the sheath was flushed. Omni Flush catheter was used to navigate a Bentson wire over the aortic bifurcation into the left iliac system. Catheter was exchanged for a C2 catheter advanced into the hypogastric artery with an angiogram performed. Catheter was withdrawn and redirected into the external iliac artery. Angiogram was performed. A Rosen wire was advanced into the proximal femoral system and the catheter was withdrawn. Six French right tip sheath was advanced over the bifurcation into the left iliac system. 7 mm x 4 cm balloon was advanced over the Rosen wire into the proximal left external iliac artery. Balloon was gently inflated to low atmospheric pressure, with inflation of approximately 4 minutes. At this time, measurements of the left external iliac  artery were performed. Balloon was deflated, the balloon catheter was advanced into the proximal left femoral system, and the 035 Rosen wire was exchanged for an 018 S-V8 wire. Balloon catheter was removed. A 29mm x 27mm Viabahn stent graft was select thinned and advanced over the 018 wire to the targeted the avulsed artery. Stent graft was deployed. Balloon angioplasty to the nominal diameter of 6 mm was performed. Final angiogram was performed. The 6 French 35 cm right tip sheath was  exchanged for a standard 10 cm 6 French sheath. Sheath was elected to remain given the patient's coagulopathy. Patient tolerated the procedure well with no significant blood loss. IMPRESSION: Status post left lower extremity angiogram with emergent covered stent embolization of distal external iliac artery branch vessel pseudoaneurysm/avulsion, with placement of stent graft. Signed, Dulcy Fanny. Earleen Newport, DO [facility] Radiology Electronically Signed   By: Corrie Mckusick D.O.   On: 12/26/2015 22:45   Ct Angio Abd/pel W/ And/or W/o  12/28/2015  CLINICAL DATA:  75 year old female with left hip arthroplasty and postoperative hemorrhage. Discovery of rate atrial thrombus, with respiratory compromise. Status post initiation of catheter directed thrombolysis of atrial thrombus via right common femoral vein approach. EXAM: CT ANGIOGRAPHY ABDOMEN AND PELVIS TECHNIQUE: Multidetector CT imaging through the abdomen and pelvis was performed using the standard protocol during bolus administration of intravenous contrast. Multiplanar reconstructed images including MIPs were obtained and reviewed to evaluate the vascular anatomy. CONTRAST:  100 cc Isovue 370 COMPARISON:  CT chest 12/27/2015, CT pelvis 12/26/2015 FINDINGS: LOWER CHEST: Nonvascular: Unremarkable appearance of the superficial soft tissues the chest. Heart size within normal limits. Tip of left subclavian central venous catheter partially  visualized terminating in the superior vena cava. Tip of the lower extremity lytic catheter terminates in the right atrium. Questionable hypoperfusion of the myocardium. Small bilateral pleural effusions with associated atelectasis. Gastric tube continues through the esophagus terminating in the stomach. Aeration of the bilateral lower lobes improved compared to the prior CT, status post intubation with positive pressure ventilation. Endotracheal tube terminates above the carina. Vascular: Normal course caliber and contour of the thoracic aorta. Minimal atherosclerotic changes. No dissection or aneurysm. No ulceration. Timing is not optimized for the evaluation of the pulmonary arteries, however, there is no central, lobar filling defects. There are a few visualized segmental/subsegmental filling defects in the distribution of the comparison CT, with improved thrombus burden of the bilateral lower lobes. There is no large filling defect identified within the right atrium on the CT. ABDOMEN PELVIS: Nonvascular: Intermediate density fluid layered in the bilateral subdiaphragmatic space tracking along the bilateral pericolic gutter. Mixed density fluid in the pelvis, predominantly extraperitoneal (within the space of Retzius. Overall volume appears decreased from the comparison CT of the pelvis performed 12/26/2015 largest component on today's study measures approximately 16.3 cm by 11 cm, compared to 15.5 cm x 15 cm. Unremarkable appearance of liver, spleen. Unremarkable appearance of the pancreas. Unremarkable appearance of the bilateral adrenal glands. No evidence of hydronephrosis. Unremarkable course of the bilateral ureters, which are partially opacified with contrast. High density material within the gallbladder, compatible with vicarious excretion of contrast. Urinary bladder decompressed with catheter in position. Vascular: Normal course caliber and contour of the abdominal aorta. Scattered calcifications  present. No aneurysm or dissection flap. No periaortic fluid. Celiac artery and superior mesenteric artery are patent. Bilateral renal arteries patent with no significant stenosis. Inferior mesenteric artery patent. Right lower extremity: Normal course caliber and contour of the right external iliac artery without aneurysm or dissection flap. No active extravasation or evidence of arterial injury. Right hypogastric arteries patent including anterior and posterior branches. Proximal right femoral arteries patent. Left lower extremity: Normal course caliber and contour of the left common iliac artery. No aneurysm or dissection. No occlusion. Hypogastric arteries patent including anterior posterior branches. No evidence of active extravasation or contrast pooling involving the hypogastric territory. External iliac artery is patent. Vascular stent in place at the site of  previously identified injury. Stent terminates at the level of the inguinal ligament. Proximal left femoral arteries unremarkable with no active extravasation. There is enlarging hematoma of the proximal left thigh compared to the prior pelvis CT. Hematoma measures approximately 10.5 cm in greatest diameter, increased from 9.3 cm on the prior. Hematoma centered within the anterior and lateral rectus musculature. There is a tiny linear hyperdensity centered within the hematoma on the lateral aspect, potentially site of hemorrhage. No significant pooling on the post contrast images. Unremarkable appearance of the venous system. Venous catheter present within the right femoral system, traversing the IVC. The left external iliac and common iliac vein are compressed by a pelvic hematoma. Musculoskeletal: Surgical changes of left hip arthroplasty. Mild degenerative changes of the spine. No displaced fracture is identified. Review of the MIP images confirms the above findings. IMPRESSION: No evidence of new intraperitoneal or pelvic hemorrhage to account for  new back pain. Re- demonstration of extraperitoneal and intraperitoneal hemorrhage, with decreasing size of the largest component in the space of Retzius and within the extraperitoneal pelvis. There is increasing hematoma in the left proximal thigh, with dense focus centered within the musculature which may indicate venous bleeding or small arterial bleeding. This detail was discussed with the managing physicians at the time of the CT scan. Patent left external iliac stent, status post embolization. Questionable hypodensity of the left ventricle myocardium, potentially indicating hypoperfusion. Compared to the prior CT of the chest, there is decreasing appreciable volume of pulmonary embolus in the bilateral lower lobes. No large right atrial filling defect identified on the delayed images. Small bilateral pleural effusions with associated atelectasis. Signed, Dulcy Fanny. Earleen Newport, DO [facility] Radiology Electronically Signed   By: Corrie Mckusick D.O.   On: 12/28/2015 21:10   Ir Infusion Thrombol Venous Initial (ms)  12/28/2015  INDICATION: 75 year old female, status post postoperative hemorrhage left external iliac artery branch vessel, with embolization for hemostasis. Subsequent development of respiratory decompensation with cardiac echo demonstrating giant atrial thrombus. Surgical embolectomy carries a high morbidity/ mortality given the patient's current ICU status. Alternative treatment options include catheter embolectomy/thrombectomy, as well as catheter directed thrombolysis. Family has elected to proceed with least invasive therapy. EXAM: IR INFUSION THROMBOL ARTERIAL INITIAL (MS); IR ULTRASOUND GUIDANCE VASC ACCESS RIGHT; INFERIOR VENA CAVOGRAM COMPARISON:  CT pelvis 12/26/2015 CT chest 12/27/2015 Cardiac ECHO 12/27/2015 and 12/28/2015 MEDICATIONS: None. ANESTHESIA/SEDATION: Versed 0.5 mg IV; Fentanyl 0 mcg IV No moderate sedation The patient was  continuously monitored during the procedure by the interventional radiology nurse under my direct supervision. FLUOROSCOPY TIME:  Fluoroscopy Time: 0 minutes 36 seconds (58 mGy). COMPLICATIONS: None TECHNIQUE: Informed written consent was obtained from the patient and the patient's husband after a thorough discussion of the procedural risks, benefits and alternatives. All questions were addressed. Maximal Sterile Barrier Technique was utilized including caps, mask, sterile gowns, sterile gloves, sterile drape, hand hygiene and skin antiseptic. A timeout was performed prior to the initiation of the procedure. Patient positioned supine position on the fluoroscopy table. The right inguinal region was prepped and draped in the usual sterile fashion. Ultrasound survey of the right inguinal region was performed with images stored and sent to PACs. A single wall 19 gauge needle was used access the right common femoral vein under ultrasound. With excellent venous blood flow returned, an 035 Bentson wire was passed through the needle, observed enter the abdominal IVC under fluoroscopy. The needle was removed, and a standard 5 [place] vascular sheath  was placed. The dilator was removed and the sheath was flushed. Limited venogram was then performed of the right iliac vein and the IVC. A stiff Glidewire was then advanced to the inferior cavoatrial junction. A 20 cm infusion length Uni fuse catheter was then placed at the suprarenal IVC, with the tip terminating in the inferior right atrium. Final image was stored. Patient tolerated the procedure well and remained hemodynamically unchanged. No blood loss. FINDINGS: Ultrasound survey demonstrates patent right common femoral vein. Limited venogram of the right iliac vein and IVC demonstrates no thrombus of the visualized right iliac system. Venous reflux into the left iliac system with slow flow through the IVC. No occlusive thrombus of the infrarenal or suprarenal IVC. There is  poor definition of the known right atrial thrombus given the low volume injection. IMPRESSION: Status post right common femoral vein access for placement of thrombolysis catheter and initiation of pharmacologic thrombolysis to treat known right atrial thrombus. Signed, Dulcy Fanny. Earleen Newport, DO [facility] Radiology Electronically Signed   By: Corrie Mckusick D.O.   On: 12/28/2015 16:56     Subjective: Pt is tired but says that she is ready to try for rehab.  Ortho is ok with CIR today.   Discharge Exam: Filed Vitals:   01/16/16 0051 01/16/16 0337  BP: 93/55 100/55  Pulse: 74 104  Temp: 98.2 F (36.8 C) 98.8 F (37.1 C)  Resp: 16 14   Filed Vitals:   01/15/16 2003 01/16/16 0051 01/16/16 0337 01/16/16 0351  BP: 115/60 93/55 100/55   Pulse: 100 74 104   Temp: 98.5 F (36.9 C) 98.2 F (36.8 C) 98.8 F (37.1 C)   TempSrc: Oral Oral Oral   Resp: 16 16 14    Height:      Weight:    139 lb 12.4 oz (63.4 kg)  SpO2: 98% 95% 96%     Eyes: PERRL, lids and conjunctivae normal ENMT: Mucous membranes are dry. No oropharyngeal exudates.  Respiratory: clear to auscultation bilaterally, no wheezing, no crackles. Weak respiratory effort. No accessory muscle use.  Cardiovascular: Regular rate and rhythm, no murmurs / rubs / gallops. No LE edema.  Abdomen: no tenderness. Bowel sounds positive.  Musculoskeletal: no clubbing / cyanosis. Wound healing well. Neurologic: non focal   The results of significant diagnostics from this hospitalization (including imaging, microbiology, ancillary and laboratory) are listed below for reference.     Microbiology: No results found for this or any previous visit (from the past 240 hour(s)).   Labs: BNP (last 3 results) No results for input(s): BNP in the last 8760 hours. Basic Metabolic Panel:  Recent Labs Lab 01/10/16 0358 01/11/16 0541 01/12/16 0304 01/13/16 0340 01/14/16 0405 01/15/16 0350  NA  153* 146* 148* 140 138 138  K 3.1* 3.2* 3.6 3.5 3.7 3.5  CL 124* 117* 110 112* 108 108  CO2 25 22 21* 23 23 24   GLUCOSE 145* 159* 130* 118* 114* 114*  BUN 44* 35* 28* 24* 15 15  CREATININE 0.52 0.49 0.45 0.49 0.49 0.42*  CALCIUM 8.9 8.7* 9.8 8.8* 8.9 8.7*  MG 2.3 2.1 2.1 2.0 2.3  --   PHOS  --   --   --   --   --  3.4   Liver Function Tests:  Recent Labs Lab 01/14/16 0405  AST 55*  ALT 69*  ALKPHOS 80  BILITOT 0.9  PROT 4.9*  ALBUMIN 2.3*   No results for input(s): LIPASE, AMYLASE in  the last 168 hours. No results for input(s): AMMONIA in the last 168 hours. CBC:  Recent Labs Lab 01/10/16 0358 01/11/16 0541 01/12/16 0304 01/13/16 0340 01/14/16 0405 01/15/16 0350  WBC 12.7* 12.7* 11.1* 10.7* 9.6 9.5  NEUTROABS 10.3* 10.6* 9.0* 8.4* 7.8*  --   HGB 9.8* 9.8* 9.5* 9.7* 9.6* 9.3*  HCT 33.9* 32.6* 31.2* 32.0* 30.3* 29.7*  MCV 103.0* 100.9* 99.7 99.1 98.1 98.7  PLT 289 278 287 305 297 283   Cardiac Enzymes: No results for input(s): CKTOTAL, CKMB, CKMBINDEX, TROPONINI in the last 168 hours. BNP: Invalid input(s): POCBNP CBG:  Recent Labs Lab 01/15/16 0613 01/15/16 1210 01/15/16 1635 01/15/16 2151 01/16/16 0637  GLUCAP 126* 133* 126* 138* 127*   D-Dimer No results for input(s): DDIMER in the last 72 hours. Hgb A1c No results for input(s): HGBA1C in the last 72 hours. Lipid Profile No results for input(s): CHOL, HDL, LDLCALC, TRIG, CHOLHDL, LDLDIRECT in the last 72 hours. Thyroid function studies No results for input(s): TSH, T4TOTAL, T3FREE, THYROIDAB in the last 72 hours.  Invalid input(s): FREET3 Anemia work up No results for input(s): VITAMINB12, FOLATE, FERRITIN, TIBC, IRON, RETICCTPCT in the last 72 hours. Urinalysis    Component Value Date/Time   COLORURINE YELLOW 01/06/2016 0610   APPEARANCEUR CLOUDY* 01/06/2016 0610   LABSPEC 1.026 01/06/2016 0610   PHURINE 6.0 01/06/2016 0610   GLUCOSEU NEGATIVE 01/06/2016 0610   HGBUR LARGE* 01/06/2016  0610   BILIRUBINUR NEGATIVE 01/06/2016 0610   KETONESUR NEGATIVE 01/06/2016 0610   PROTEINUR 100* 01/06/2016 0610   NITRITE NEGATIVE 01/06/2016 0610   LEUKOCYTESUR NEGATIVE 01/06/2016 0610   Sepsis Labs Invalid input(s): PROCALCITONIN,  WBC,  LACTICIDVEN Microbiology No results found for this or any previous visit (from the past 240 hour(s)).   Time coordinating discharge: 40 minutes  SIGNED:  Irwin Brakeman, MD  [facility] 01/16/2016, 8:53 AM Pager   If 7PM-7AM, please contact night-coverage [email or web address] Password [email or web address]

## 2016-01-16 NOTE — Progress Notes (Signed)
Inpatient Rehabilitation  Note MD's discharge, have insurance approval and a bed available today.  Plan to admit patient to IP Rehab today.  Please call with questions.    Carmelia Roller., CCC/SLP Admission Coordinator  [place]  Cell [phone]

## 2016-01-16 NOTE — Progress Notes (Signed)
Bonnie Salazar Rehab Admission Coordinator Signed Physical Medicine and Rehabilitation PMR Pre-admission 01/14/2016 4:40 PM  Related encounter: ED to Hosp-Admission (Discharged) from 12/25/2015 in [facility]   PMR Admission Coordinator Pre-Admission Assessment  Patient: Bonnie Salazar is an 75 y.o., female MRN: [id number] DOB: 10/22/1940 Height: 5\' 7"  (170.2 cm) Weight: 63.4 kg (139 lb 12.4 oz)  Insurance Information HMO: PPO: yes PCP: IPA: 80/20: OTHER:  PRIMARY: Policy#: [id number] Subscriber: self CM Name: Maudie Mercury Phone#: [phone] Fax#: [phone] Pre-Cert#: [id number] for 13 days 01/16/16-01/28/16 with faxed updates due 01/26/16 Employer: retired Benefits: Phone #: [phone] Name: Lavell Islam. Date: 07/13/15 Deduct: $0 Out of Pocket Max: $4950 Life Max: unlimited CIR: $318 per day, days 1-5 with $1590 maximum SNF: $0 days 1-20; days 21-100 $164 daily Outpatient: $40 copay  Home Health: 100% Co-Pay DME: 80%/20%  Providers: In network SECONDARY: Policy#: Subscriber:  CM Name: Phone#: Fax#:  Pre-Cert#: Employer:  Benefits: Phone #: Name:  Eff. Date: Deduct: Out of Pocket Max: Life Max:  CIR: SNF:  Outpatient: Co-Pay:  Home Health: Co-Pay:  DME: Co-Pay:   Medicaid Application Date: Case Manager:  Disability Application Date: Case Worker:   Emergency Facilities manager Information    Name Relation Home Work [place] Spouse [phone]  [phone]   No name specified         Current Medical History  Patient Admitting Diagnosis:  Functional, mobility, and swallowing/communication deficits secondary to left femoral neck fracture and subsequent complications History of Present Illness: Bonnie Salazar is a 75 y.o. female admitted on 12/25/15 after a fall with subsequent left femoral neck fracture. She underwent left anterior hip arthroplasty by Dr. Fredonia Highland. Post op course complicated by abdominal distension with hypotension due to bleeding from distal iliac artery. She underwent distal external iliac artery stent down to inguinal ligament. She was treated with 11u PRBC, 7 units FFP, 2 units PLT pheresis and cryoprecipitate for life threatening bleed. Dr. Hulen Skains consulted and recommended monitoring. She tolerated extubation on 06/17 and ortho recommended WBAT with ASA for DVT prophylaxis. BLE dopplers 6/17 were negative for DVT. 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 She developed respiratory distress with marked tachycardia on 06/18 requiring face mask. CTA chest done showing bilateral PE with completely occlusive thrombus in LLL PA with nearly occlusive segmental pulmonary arteries and right heart strain. Dr. Cyndia Bent consulted and following for input. She underwent catheter lysis of intra-atrial clot by Dr. Earleen Newport. Repeat echo 06/19 revealed that RA thrombus no longer present.   Post procedure developed leg, back and abdominal pain with hypotension requiring neo and was transfused with 2 units PRBC. She was sedated and intubated for airway protection. She developed PEA cardiac arrest later that evening requiring 10 minutes of resuscitation. CTA abdomen/pelvis showed decrease in extraperitoneal bleed, compression of left external iliac and comon iliac veins by pelvic hematoma, no new pelvic hematoma and enlarging hematoma proximal left thigh. CT head without acute changes. Heparin was discontinued and IVC filter placed on  06/21. Has been treated with antibiotics for fevers as well as IV diuresis for fluid overload. She tolerated extubation on 06/23 and continues to be NPO due to high aspiration risk. MBS done yesterday revealing oropharyngeal dysphagia with delay in swallow, moderate residue, fatigue and aspiration. She developed bloody stools on 06/26 and GI  consulted for input. Dr. Silverio Decamp questioned ischemic colitis v/s hemorrhoids as cause of bleeding and supportive care recommended. She has had intermittent rectal bleeding felt to be due to hems. H/H has been relatively stable and leucocytosis has resolved. Repeat swallow evaluation done on 7/03 and she was started on dysphagia 1, nectar liquids. Tube feeds discontinued with resolution of hypernatremia and diarrhea. Patient continue to have DOE but mobility improving and patient remains motivated. CIR recommended for follow up therapy and patient cleared medically for intensive rehab program.       Past Medical History  Past Medical History  Diagnosis Date  . Acute pulmonary embolism ([place]) 12/28/2015  . Cardiac arrest ([place]) 12/2015  . Fracture of femoral neck, left ([place]) 01/2016  . Acute respiratory failure with hypoxia ([place]) 01/2016  . Arterial hemorrhage 01/2016  . Atrial mass 01/2016  . Female pelvic hematoma 01/2016  . Hypernatremia 01/2016  . 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  Prior Rehab/Hospitalizations:  Has the patient had major surgery during 100 days prior to admission? No  Current Medications   Current facility-administered medications:  . acetaminophen (TYLENOL) solution 650 mg, 650 mg, Per Tube, Q6H PRN, Anders Simmonds, MD, 650 mg at 01/07/16 2345 . acetaminophen (TYLENOL) tablet 650 mg, 650 mg, Oral, Q6H PRN, Albertine Patricia, MD, 650 mg at 01/15/16 1926 .  antiseptic oral rinse (CPC / CETYLPYRIDINIUM CHLORIDE 0.05%) solution 7 mL, 7 mL, Mouth Rinse, q12n4p, Allie Bossier, MD, 7 mL at 01/15/16 1207 . chlorhexidine (PERIDEX) 0.12 % solution 15 mL, 15 mL, Mouth Rinse, BID, Allie Bossier, MD, 15 mL at 01/16/16 0807 . diltiazem (CARDIZEM) tablet 30 mg, 30 mg, Oral, Q6H, Allie Bossier, MD, 30 mg at 01/16/16 1003 . docusate (COLACE) 50 MG/5ML liquid 100 mg, 100 mg, Per Tube, BID PRN, Donita Brooks, NP . feeding supplement (ENSURE ENLIVE) (ENSURE ENLIVE) liquid 237 mL, 237 mL, Oral, TID BM, Silver Huguenin Elgergawy, MD, 237 mL at 01/16/16 1000 . hydrALAZINE (APRESOLINE) injection 10 mg, 10 mg, Intravenous, Q4H PRN, Anders Simmonds, MD . HYDROcodone-acetaminophen (NORCO/VICODIN) 5-325 MG per tablet 1 tablet, 1 tablet, Oral, Daily PRN, Albertine Patricia, MD . insulin aspart (novoLOG) injection 0-15 Units, 0-15 Units, Subcutaneous, TID WC, Costin Karlyne Greenspan, MD, 2 Units at 01/16/16 [phone] . insulin aspart (novoLOG) injection 0-5 Units, 0-5 Units, Subcutaneous, QHS, Caren Griffins, MD, 0 Units at 01/13/16 2114 . loperamide (IMODIUM) 1 MG/5ML solution 1 mg, 1 mg, Per Tube, PRN, Allie Bossier, MD, 1 mg at 01/12/16 0044 . [DISCONTINUED] metoCLOPramide (REGLAN) tablet 5-10 mg, 5-10 mg, Oral, Q8H PRN **OR** metoCLOPramide (REGLAN) injection 5-10 mg, 5-10 mg, Intravenous, Q8H PRN, Geradine Girt, DO . metoprolol (LOPRESSOR) injection 2.5-5 mg, 2.5-5 mg, Intravenous, Q3H PRN, Donita Brooks, NP, 5 mg at 01/03/16 1125 . metoprolol tartrate (LOPRESSOR) tablet 25 mg, 25 mg, Oral, BID, Charlynne Cousins Gribbin, PA-C, 25 mg at 01/16/16 1004 . ondansetron (ZOFRAN) tablet 4 mg, 4 mg, Oral, Q6H PRN **OR** ondansetron (ZOFRAN) injection 4 mg, 4 mg, Intravenous, Q6H PRN, Geradine Girt, DO . pantoprazole (PROTONIX) EC tablet 40 mg, 40 mg, Oral, Daily, Vena Rua, PA-C, 40 mg at 01/16/16 0807 . RESOURCE THICKENUP CLEAR, , Oral, PRN, Costin Karlyne Greenspan, MD  Patients Current  Diet: DIET - DYS 1 Room service appropriate?: Yes with Assist; Fluid  consistency:: Thin  Precautions / Restrictions Precautions Precautions: Fall Restrictions Weight Bearing Restrictions: Yes LLE Weight Bearing: Weight bearing as tolerated   Has the patient had 2 or more falls or a fall with injury in the past year?No  Prior Activity Level Community (5-7x/wk): Pt. was very active PTA. Husband reports pt. would drive to [facility] daily and take a 1 1/2 miile walk. Pt. states she enjoys reading and watching dancing programs onTV.  Home Assistive Devices / Equipment    Prior Device Use: Indicate devices/aids used by the patient prior to current illness, exacerbation or injury? None of the above  Prior Functional Level Prior Function Level of Independence: Independent Comments: per pt she did not use a RW or cane PTA  Self Care: Did the patient need help bathing, dressing, using the toilet or eating? Independent  Indoor Mobility: Did the patient need assistance with walking from room to room (with or without device)? Independent  Stairs: Did the patient need assistance with internal or external stairs (with or without device)? Independent  Functional Cognition: Did the patient need help planning regular tasks such as shopping or remembering to take medications? Independent  Current Functional Level Cognition  Overall Cognitive Status: Within Functional Limits for tasks assessed Current Attention Level: Sustained Orientation Level: Oriented X4 Following Commands: Follows one step commands with increased time General Comments: Appears to require significant effort to perform minimal motor movement/follow commands    Extremity Assessment (includes Sensation/Coordination)  Upper Extremity Assessment: RUE deficits/detail, LUE deficits/detail RUE Deficits / Details: grossly 2-/5 RUE Coordination: decreased fine motor, decreased gross motor LUE Deficits / Details:  grossly 2-/5 LUE Coordination: decreased fine motor, decreased gross motor  Lower Extremity Assessment: Defer to PT evaluation    ADLs  Overall ADL's : Needs assistance/impaired Eating/Feeding: NPO Grooming: Wash/dry hands, Wash/dry face, Standing, Min guard Grooming Details (indicate cue type and reason): pt seated for hair washing by therapist. Pt encouraged to participate, however she declined multiple times Upper Body Bathing: Total assistance, Bed level, Sitting Lower Body Bathing: Total assistance, Sit to/from stand Upper Body Dressing : Sitting, Minimal assistance Lower Body Dressing: Total assistance, Bed level, Sit to/from stand Toilet Transfer: RW, +2 for safety/equipment, Comfort height toilet, Ambulation, Moderate assistance Toileting- Clothing Manipulation and Hygiene: Moderate assistance, Sit to/from stand Functional mobility during ADLs: Moderate assistance, +2 for safety/equipment General ADL Comments: refused to stand a second time for [facility] T/F     Mobility  Overal bed mobility: Needs Assistance Bed Mobility: Supine to Sit Supine to sit: Mod assist Sit to supine: Max assist General bed mobility comments: Pt able to reach for railing but required mod assist for LE advancement and max assist for scooting to edge of bed. Assist required as well for trunk elevation.     Transfers  Overall transfer level: Needs assistance Equipment used: Rolling walker (2 wheeled) Transfers: Sit to/from Stand Sit to Stand: Mod assist, Min assist (Assist level varried required mod from bed and min from higher seated BSC. ) Stand pivot transfers: Mod assist General transfer comment: Required assist to boost into standing performed standing from bed and BSC. Pt required cues for hand placement, forward weight shifting and transition of hand from seated surface to RW grips. demonstrates poor hand placement and eccentric loading during stand to sit.     Ambulation / Gait /  Stairs / Wheelchair Mobility  Ambulation/Gait Ambulation/Gait assistance: Mod assist Ambulation Distance (Feet): 18 Feet Assistive device: Rolling walker (2 wheeled) Gait Pattern/deviations: Step-to  pattern, Antalgic, Decreased stride length, Shuffle General Gait Details: Pt remains to fatigue quickly and requires encouragement to advance gait distance. Pt required assist to position walker with max VCs for step sequencing. Pt flexing over RW and requiring cues to correct posture for safety. Pt presents with improved gait speed and able to advance gait distance this tx. Close chair follow provided.  Gait velocity: very slow.  Gait velocity interpretation: Below normal speed for age/gender    Posture / Balance Dynamic Sitting Balance Sitting balance - Comments: Pt able to sit EOB ~5 minutes w/ UE supported on bed w/ close min guard assist Balance Overall balance assessment: Needs assistance Sitting-balance support: No upper extremity supported Sitting balance-Leahy Scale: Fair Sitting balance - Comments: Pt able to sit EOB ~5 minutes w/ UE supported on bed w/ close min guard assist Postural control: Posterior lean Standing balance support: Bilateral upper extremity supported, During functional activity Standing balance-Leahy Scale: Poor Standing balance comment: Relies on UE support from RW and additional physical assist    Special needs/care consideration BiPAP/CPAP no CPM no Continuous Drip IV Dialysis no Days  Life Vest no Oxygen no Special Bed no Trach Size no Wound Vac (area) no Location Skin Appropriate, dry  Bowel mgmt Small 01/16/16 Bladder mgmt Incontinent  Diabetic mgmt yes     Previous Home Environment Living Arrangements: Spouse/significant other (retired) Available Help at Discharge: Family, Available 24 hours/day Home Care Services: No Additional Comments: Difficult hx due to low tone  of voice and slow processing.  Discharge Living Setting Plans for Discharge Living Setting: Patient's home Type of Home at Discharge: House Discharge Home Layout: Two level, Able to live on main level with bedroom/bathroom Discharge Home Access: Level entry Discharge Bathroom Shower/Tub: Tub/shower unit, Walk-in shower Discharge Bathroom Toilet: Standard Discharge Bathroom Accessibility: Yes How Accessible: Accessible via walker Does the patient have any problems obtaining your medications?: No  Social/Family/Support Systems Patient Roles: Spouse, Parent, Other (Comment) (grandparent) Anticipated Caregiver: husband, Bonnie Salazar Anticipated Caregiver's Contact Information: [phone] Ability/Limitations of Caregiver: no limitations Caregiver Availability: 24/7 Discharge Plan Discussed with Primary Caregiver: Yes Is Caregiver In Agreement with Plan?: Yes Does Caregiver/Family have Issues with Lodging/Transportation while Pt is in Rehab?: No  Goals/Additional Needs Patient/Family Goal for Rehab: supervision and min assist PT/OT; mod I and supervision SLP Expected length of stay: 18-24 days Cultural Considerations: "[employer]" Dietary Needs: dysphagia 1, nectar thick liquids Equipment Needs: TBA Pt/Family Agrees to Admission and willing to participate: Yes Program Orientation Provided & Reviewed with Pt/Caregiver Including Roles & Responsibilities: Yes  Decrease burden of Care through IP rehab admission: no  Possible need for SNF placement upon discharge: Not anticipated  Patient Condition: This patient's medical and functional status has changed since the consult dated: 01/08/16 in which the Rehabilitation Physician determined and documented that the patient's condition is appropriate for intensive rehabilitative care in an inpatient rehabilitation facility. See "History of Present Illness" (above) for medical update. Functional changes are: Mod assist transfers and gait  with rolling walker for 18 feet. Patient's medical and functional status update has been discussed with the Rehabilitation physician and patient remains appropriate for inpatient rehabilitation. Will admit to inpatient rehab today.  Preadmission Screen Completed By: Bonnie Salazar, 01/16/2016 11:22 AM ______________________________________________________________________  Discussed status with Dr. Posey Pronto on 01/16/16 at 1125 and received telephone approval for admission today.  Admission Coordinator: Bonnie Salazar, time1125/Date 01/16/16          Cosigned by: Ankit Lorie Phenix, MD at 01/16/2016 11:33 AM  Revision History     Date/Time User Provider Type Action   01/16/2016 11:33 AM Ankit Lorie Phenix, MD Physician Cosign   01/16/2016 11:28 AM Bonnie Salazar Rehab Admission Coordinator Sign   01/16/2016 11:25 AM Bonnie Salazar Rehab Admission Coordinator Share   01/14/2016 5:05 PM Gerlean Ren Rehab Admission Coordinator Share   View Details Report

## 2016-01-16 NOTE — Progress Notes (Signed)
Physical Therapy Treatment Patient Details Name: Bonnie Salazar MRN: [id number] DOB: [birth year] Today's Date: 01/16/2016    History of Present Illness 75 y.o. female admitted to [facility] on 12/25/15 s/p fall with resultant L hip fx s/p L direct anterior THA, WBAT. Pt was extubated in the PACU. Shortly thereafter, she complained of abdominal pain and was noted to have a tense abdomen. CT scan showed extravasation into the LLQ; she was taken to IR and underwent covered stent of the distal external iliac artery to the inguinal ligament. Vascular surgery was also consulted. No signs of distal ischemia on his exam. There is some concern for abdominal compartment syndrome, as well. MTP was initiated and she received 11u pRBC, 7FFP, 2 PLT pheresis, 2 cryoprecipitate. Returned to ICU on mechanical ventilation. She suffered a PEA arrest 6/18. ICU course complicated by further bleeding. IVC filter placed. Intubated 6/16-6/17/17 and 6/18-6/23/17. Pt with significant PMHx of back surgery listed in chart. bil PE found on CT 6/17.     PT Comments    Pt performed increased activity and able to advance gait distance at this time.  Pt remains to require encouragement for sequencing and advancing gait distance.    Follow Up Recommendations  CIR     Equipment Recommendations  Rolling walker with 5" wheels;3in1 (PT)    Recommendations for Other Services       Precautions / Restrictions Precautions Precautions: Fall Restrictions Weight Bearing Restrictions: Yes LLE Weight Bearing: Weight bearing as tolerated    Mobility  Bed Mobility Overal bed mobility: Needs Assistance Bed Mobility: Supine to Sit     Supine to sit: Mod assist     General bed mobility comments: Pt able to reach for railing but required mod assist for LE advancement and max assist for scooting to edge of bed.  Assist required as well for trunk elevation.    Transfers Overall transfer level: Needs assistance Equipment used:  Rolling walker (2 wheeled) Transfers: Sit to/from Stand Sit to Stand: Mod assist;Min assist (Assist level varried required mod from bed and min from higher seated BSC.  )         General transfer comment: Required assist to boost into standing performed standing from bed and BSC.  Pt required cues for hand placement, forward weight shifting and transition of hand from seated surface to RW grips.  demonstrates poor hand placement and eccentric loading during stand to sit.    Ambulation/Gait Ambulation/Gait assistance: Mod assist   Assistive device: Rolling walker (2 wheeled) Gait Pattern/deviations: Step-to pattern;Antalgic;Decreased stride length;Shuffle Gait velocity: very slow.     General Gait Details: Pt remains to fatigue quickly and requires encouragement to advance gait distance.  Pt required assist to position walker with max VCs for step sequencing.  Pt flexing over RW and requiring cues to correct posture for safety.  Pt presents with improved gait speed and able to advance gait distance this tx.  Close chair follow provided.       Stairs            Wheelchair Mobility    Modified Rankin (Stroke Patients Only)       Balance     Sitting balance-Leahy Scale: Fair       Standing balance-Leahy Scale: Poor                      Cognition Arousal/Alertness: Awake/alert Behavior During Therapy: WFL for tasks assessed/performed Overall Cognitive Status: Within Functional Limits for tasks assessed  Exercises      General Comments        Pertinent Vitals/Pain Pain Assessment: No/denies pain Pain Score: 5  Pain Location: Back and R hip.   Pain Descriptors / Indicators: Guarding Pain Intervention(s): Monitored during session;Repositioned    Home Living                      Prior Function            PT Goals (current goals can now be found in the care plan section) Acute Rehab PT Goals Patient Stated Goal:  none stated Potential to Achieve Goals: Fair Progress towards PT goals: Progressing toward goals    Frequency  Min 5X/week    PT Plan Current plan remains appropriate    Co-evaluation             End of Session Equipment Utilized During Treatment: Gait belt Activity Tolerance: Patient limited by fatigue Patient left: with call bell/phone within reach;with family/visitor present;in chair     Time: [id number] PT Time Calculation (min) (ACUTE ONLY): 25 min  Charges:  $Gait Training: 8-22 mins $Therapeutic Activity: 8-22 mins                    G Codes:      Cristela Blue Jan 25, 2016, 11:16 AM Governor Rooks, PTA pager [phone]

## 2016-01-16 NOTE — Progress Notes (Signed)
Occupational Therapy Treatment Patient Details Name: Bonnie Salazar MRN: [id number] DOB: 11-03-1940 Today's Date: 01/16/2016    History of present illness 75 y.o. female admitted to [facility] on 12/25/15 s/p fall with resultant L hip fx s/p L direct anterior THA, WBAT. Pt was extubated in the PACU. Shortly thereafter, she complained of abdominal pain and was noted to have a tense abdomen. CT scan showed extravasation into the LLQ; she was taken to IR and underwent covered stent of the distal external iliac artery to the inguinal ligament. Vascular surgery was also consulted. No signs of distal ischemia on his exam. There is some concern for abdominal compartment syndrome, as well. MTP was initiated and she received 11u pRBC, 7FFP, 2 PLT pheresis, 2 cryoprecipitate. Returned to ICU on mechanical ventilation. She suffered a PEA arrest 6/18. ICU course complicated by further bleeding. IVC filter placed. Intubated 6/16-6/17/17 and 6/18-6/23/17. Pt with significant PMHx of back surgery listed in chart. bil PE found on CT 6/17.    OT comments  Pt making progress with functional goals. Pt scheduled to d/c to CIR this afternoon.  Follow Up Recommendations  CIR;Supervision/Assistance - 24 hour    Equipment Recommendations  3 in 1 bedside comode;Tub/shower bench    Recommendations for Other Services      Precautions / Restrictions Precautions Precautions: Fall Restrictions Weight Bearing Restrictions: Yes LLE Weight Bearing: Weight bearing as tolerated       Mobility Bed Mobility Overal bed mobility: Needs Assistance Bed Mobility: Sit to Supine     Supine to sit: Mod assist Sit to supine: Mod assist   General bed mobility comments: Pt able to reach for railing but required mod assist for LE advancement and max assist for scooting to edge of bed.  Assist required as well for trunk elevation.    Transfers Overall transfer level: Needs assistance Equipment used: Rolling walker (2  wheeled) Transfers: Sit to/from Stand Sit to Stand: Mod assist;Min assist         General transfer comment: Required assist to boost into standing performed standing from bed and BSC.  Pt required cues for hand placement, forward weight shifting and transition of hand from seated surface to RW grips.  demonstrates poor hand placement and eccentric loading during stand to sit.      Balance     Sitting balance-Leahy Scale: Fair       Standing balance-Leahy Scale: Poor                     ADL       Grooming: Wash/dry hands;Wash/dry face;Standing;Min guard   Upper Body Bathing: Minimal assitance;Sitting (simulated)   Lower Body Bathing: Maximal assistance;Sitting/lateral leans (simulated)   Upper Body Dressing : Sitting;Minimal assistance       Toilet Transfer: RW;Comfort height toilet;Ambulation;Moderate assistance;Minimal assistance   Toileting- Clothing Manipulation and Hygiene: Sit to/from stand;Minimal assistance       Functional mobility during ADLs: Moderate assistance;Minimal assistance        Vision  no change from baseline                              Cognition   Behavior During Therapy: [facility] for tasks assessed/performed Overall Cognitive Status: Within Functional Limits for tasks assessed                       Extremity/Trunk Assessment   generalized weakness  General Comments  pt,pleasant and cooperative    Pertinent Vitals/ Pain       Pain Assessment: 0-10 Pain Score: 3  Pain Location: R hip and back Pain Descriptors / Indicators: Sore;Guarding Pain Intervention(s): Limited activity within patient's tolerance;Repositioned;Monitored during session  Home Living  home with hsuband                                        Prior Functioning/Environment  independent, active           Frequency Min 2X/week     Progress Toward Goals  OT Goals(current goals can  now be found in the care plan section)  Progress towards OT goals: Progressing toward goals  Acute Rehab OT Goals Patient Stated Goal: none stated  Plan Discharge plan remains appropriate                     End of Session Equipment Utilized During Treatment: Rolling walker;Gait belt;Other (comment) (BSC)   Activity Tolerance Patient limited by fatigue;Patient limited by pain   Patient Left with call bell/phone within reach;with family/visitor present;in bed             Time: 1301-1322 OT Time Calculation (min): 21 min  Charges: OT General Charges $OT Visit: 1 Procedure OT Treatments $Self Care/Home Management : 8-22 mins  Britt Bottom 01/16/2016, 2:13 PM

## 2016-01-16 NOTE — Care Management Note (Signed)
Case Management Note  Patient Details  Name: Bonnie Salazar MRN: [id number] Date of Birth: 1940-12-14  Subjective/Objective:   75 yr old female s/p left hip arthroplasty, patient had several medical problems post op.    Action/Plan:  Patient will go to CIR.   Expected Discharge Date:   01/16/16               Expected Discharge Plan:  [place]  In-House Referral:     Discharge planning Services     Post Acute Care Choice:  NA Choice offered to:  Patient, Spouse  DME Arranged:  N/A DME Agency:  NA  HH Arranged:  NA HH Agency:     Status of Service:  Completed, signed off  If discussed at [place] of Stay Meetings, dates discussed:    Additional Comments:  Ninfa Meeker, RN 01/16/2016, 11:28 AM

## 2016-01-16 NOTE — Progress Notes (Addendum)
She is doing well. Feeling ready for Inpatient rehab   Incision is benign  Plan: Ok for CIR Dressing removed, ok to leave incision open to air Ok to shower from my standpoint   Oktober Glazer D

## 2016-01-17 ENCOUNTER — Inpatient Hospital Stay (HOSPITAL_COMMUNITY): Payer: Medicare HMO | Admitting: Speech Pathology

## 2016-01-17 ENCOUNTER — Inpatient Hospital Stay (HOSPITAL_COMMUNITY): Payer: Medicare HMO | Admitting: Physical Therapy

## 2016-01-17 ENCOUNTER — Inpatient Hospital Stay (HOSPITAL_COMMUNITY): Payer: Medicare HMO | Admitting: Occupational Therapy

## 2016-01-17 LAB — CBC WITH DIFFERENTIAL/PLATELET
BASOS ABS: 0 10*3/uL (ref 0.0–0.1)
BASOS PCT: 0 %
EOS ABS: 0.1 10*3/uL (ref 0.0–0.7)
EOS PCT: 1 %
HCT: 30 % — ABNORMAL LOW (ref 36.0–46.0)
Hemoglobin: 9.8 g/dL — ABNORMAL LOW (ref 12.0–15.0)
Lymphocytes Relative: 10 %
Lymphs Abs: 0.9 10*3/uL (ref 0.7–4.0)
MCH: 31.4 pg (ref 26.0–34.0)
MCHC: 32.7 g/dL (ref 30.0–36.0)
MCV: 96.2 fL (ref 78.0–100.0)
MONO ABS: 0.6 10*3/uL (ref 0.1–1.0)
MONOS PCT: 7 %
NEUTROS ABS: 7.2 10*3/uL (ref 1.7–7.7)
Neutrophils Relative %: 82 %
PLATELETS: 273 10*3/uL (ref 150–400)
RBC: 3.12 MIL/uL — ABNORMAL LOW (ref 3.87–5.11)
RDW: 17.3 % — ABNORMAL HIGH (ref 11.5–15.5)
WBC: 8.7 10*3/uL (ref 4.0–10.5)

## 2016-01-17 LAB — COMPREHENSIVE METABOLIC PANEL
ALK PHOS: 156 U/L — AB (ref 38–126)
ALT: 102 U/L — ABNORMAL HIGH (ref 14–54)
ANION GAP: 5 (ref 5–15)
AST: 85 U/L — ABNORMAL HIGH (ref 15–41)
Albumin: 2.3 g/dL — ABNORMAL LOW (ref 3.5–5.0)
BUN: 11 mg/dL (ref 6–20)
CALCIUM: 9 mg/dL (ref 8.9–10.3)
CHLORIDE: 104 mmol/L (ref 101–111)
CO2: 27 mmol/L (ref 22–32)
Creatinine, Ser: 0.48 mg/dL (ref 0.44–1.00)
GFR calc non Af Amer: >60 mL/min (ref >60)
Glucose, Bld: 126 mg/dL — ABNORMAL HIGH (ref 65–99)
Potassium: 3.6 mmol/L (ref 3.5–5.1)
SODIUM: 136 mmol/L (ref 135–145)
Total Bilirubin: 0.9 mg/dL (ref 0.3–1.2)
Total Protein: 5.6 g/dL — ABNORMAL LOW (ref 6.5–8.1)

## 2016-01-17 LAB — GLUCOSE, CAPILLARY
GLUCOSE-CAPILLARY: 102 mg/dL — AB (ref 65–99)
Glucose-Capillary: 120 mg/dL — ABNORMAL HIGH (ref 65–99)

## 2016-01-17 MED ORDER — SENNOSIDES-DOCUSATE SODIUM 8.6-50 MG PO TABS
1.0000 | ORAL_TABLET | Freq: Two times a day (BID) | ORAL | Status: DC
  Administered 2016-01-17 – 2016-01-24 (×12): 1 via ORAL
  Filled 2016-01-17 (×13): qty 1

## 2016-01-17 MED ORDER — PANTOPRAZOLE SODIUM 40 MG PO PACK
40.0000 mg | PACK | Freq: Every day | ORAL | Status: DC
  Administered 2016-01-17 – 2016-01-24 (×8): 40 mg via ORAL
  Filled 2016-01-17 (×7): qty 20

## 2016-01-17 NOTE — Evaluation (Signed)
Speech Language Pathology Assessment and Plan  Patient Details  Name: Bonnie Salazar MRN: [id number] Date of Birth: 01-Jun-1941  SLP Diagnosis: Dysphagia  Rehab Potential: Good ELOS: 14-21 days     Today's Date: 01/17/2016 SLP Individual Time: 1300-1400 SLP Individual Time Calculation (min): 60 min   Problem List:  Patient Active Problem List   Diagnosis Date Noted  . Debility 01/16/2016  . Pain aggravated by sitting   . Abnormality of gait   . S/p left hip fracture   . Pulmonary embolism without acute cor pulmonale ([place])   . Post-operative pain   . Depression   . Bleeding gastrointestinal   . PAF (paroxysmal atrial fibrillation) ([place])   . Acute blood loss anemia   . Dysphagia   . Chronic low back pain   . Female pelvic hematoma   . Hemorrhage   . Blood per rectum   . Pulmonary embolus, left ([place])   . Pulmonary embolus, right ([place])   . Acute respiratory failure with hypoxia ([place])   . PEA (Pulseless electrical activity) ([place])   . Right atrial thrombus ([place])   . Arterial hemorrhage   . Hypovolemic shock ([place])   . Hypokalemia   . Hypernatremia   . Gastrointestinal hemorrhage with melena   . Acute pulmonary embolus ([place])   . Atrial mass   . Bleeding   . Acute pulmonary embolism ([place]) 12/28/2015  . Atrial thrombus ([place])   . Acute respiratory failure ([place])   . Hemorrhagic shock   . Cardiac arrest ([place])   . Fracture of femoral neck, left ([place]) 12/26/2015  . Hip fracture ([place]) 12/25/2015  . Leukocytosis 12/25/2015  . Dehydration 12/25/2015   Past Medical History:  Past Medical History  Diagnosis Date  . Acute pulmonary embolism ([place]) 12/28/2015  . Cardiac arrest ([place]) 12/2015  . Fracture of femoral neck, left ([place]) 01/2016  . Acute respiratory failure with hypoxia ([place]) 01/2016  . Arterial hemorrhage 01/2016  . Atrial mass 01/2016  . Female pelvic hematoma 01/2016  . Hypernatremia 01/2016  . Right atrial thrombus ([place]) 01/2016   Past Surgical History:  Past  Surgical History  Procedure Laterality Date  . Back surgery    . 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 Ivc filter placement ([facility] hx)      Assessment / Plan / Recommendation Clinical Impression  Bonnie Salazar is a 75 y.o. female admitted on 12/25/15 after a fall with subsequent left femoral neck fracture. She underwent left anterior hip arthroplasty by Dr. Fredonia Highland. Post op course complicated by abdominal distension with hypotension due to bleeding from distal iliac artery.  She tolerated extubation on 06/17. She developed respiratory distress with marked tachycardia on 06/18 requiring face mask. CTA chest done showing bilateral PE with completely occlusive thrombus in LLL PA with nearly occlusive segmental pulmonary arteries and right heart strain.  Post procedure developed leg, back and abdominal pain with hypotension requiring neo and was transfused with 2 units PRBC. She was sedated and intubated for airway protection. She developed PEA cardiac arrest later that evening requiring 10 minutes of resuscitation.  She tolerated extubation on 06/23.  MBS done revealing oropharyngeal dysphagia with delay in swallow, moderate residue, fatigue and aspiration. She developed bloody stools on 06/26 and GI consulted for input. Dr. Silverio Decamp questioned ischemic colitis v/s hemorrhoids as cause of bleeding  and supportive care recommended.  Repeat swallow evaluation done on 7/03 and she was advanced to dysphagia 1, nectar liquids.  Pt was admitted for CIR on 01/16/16.  SLP evaluation completed on 01/17/16 with the following results:  Pt presents with a mild oropharyngeal dysphagia characterized by generalized weakness and poor endurance which leads to quick fatigue and increased respiratory rate with PO intake.  Despite fatigue, pt is consistently utilizing  swallowing precautions with supervision cues and is able to clear boluses from the oral cavity without difficulty.  No overt s/s of aspiration evident with solids or liquids.  Pt's O2 sats remain WFL on room air and lung sounds are documented to be clear per nursing.    Recommend liberalizing pt's diet to dys 3 solids with continued thin liquids and intermittent supervision for use of swallowing precautions with known penetration risk due to weakness and compromised respiratory status in the hopes that diet liberalization will facilitate improved PO intake and compliance with recommendations.     Skilled Therapeutic Interventions          Bedside swallowing evaluation completed with results and recommendations reviewed with family.   Therapist discussed in depth pt's risk of penetration/aspiration given results of MBS as well as generalized weakness and fatigue noted at bedside.  Pt and pt's husband verbalized understanding of risk and wished to proceed with diet liberalization.  Pt and husband were encouraged to take smaller, more frequent meals to prevent fatigue.  They were also provided with handouts regarding pt's upgraded diet to maximize carryover of education in between therapy sessions.  All questions were answered to pt's satisfaction at this time.  Pt left in bed with husband at bedside.      SLP Assessment  Patient will need skilled Speech Lanaguage Pathology Services during CIR admission    Recommendations  SLP Diet Recommendations: Dysphagia 3 (Mech soft);Thin Liquid Administration via: Cup;No straw Medication Administration: Crushed with puree Supervision: Patient able to self feed;Intermittent supervision to cue for compensatory strategies Compensations: Slow rate;Small sips/bites;Minimize environmental distractions;Multiple dry swallows after each bite/sip;Follow solids with liquid;Clear throat intermittently Postural Changes and/or Swallow Maneuvers: Seated upright 90 degrees;Upright  30-60 min after meal Oral Care Recommendations: Oral care BID Patient destination: Home Follow up Recommendations: Other (comment) (TBD pending progress made while inpatient )    SLP Frequency 3 to 5 out of 7 days   SLP Duration  SLP Intensity  SLP Treatment/Interventions 14-21 days   Minumum of 1-2 x/day, 30 to 90 minutes  Cueing hierarchy;Environmental controls;Dysphagia/aspiration precaution training;Functional tasks;Internal/external aids;Patient/family education    Pain Pain Assessment Pain Assessment: 0-10 Pain Score: 7  Pain Type: Chronic pain Pain Location: Back Pain Orientation: Lower Pain Descriptors / Indicators: Aching Pain Frequency: Constant Pain Onset: On-going Pain Intervention(s): Repositioned;Heat applied  Prior Functioning Cognitive/Linguistic Baseline: Within functional limits Type of Home: Other(Comment) (Condo)  Lives With: Spouse Available Help at Discharge: Family;Available 24 hours/day Vocation: Retired  Function:  Eating Eating   Modified Consistency Diet: Yes Eating Assist Level: Supervision or verbal cues           Cognition Comprehension Comprehension assist level: Follows basic conversation/direction with extra time/assistive device  Expression   Expression assist level: Expresses basic needs/ideas: With extra time/assistive device  Social Interaction Social Interaction assist level: Interacts appropriately 75 - 89% of the time - Needs redirection for appropriate language or to initiate interaction.  Problem Solving Problem solving assist level: Solves basic 75 - 89% of the time/requires cueing 10 - 24%   of the time  Memory Memory assist level: Recognizes or recalls 50 - 74% of the time/requires cueing 25 - 49% of the time   Short Term Goals: Week 1: SLP Short Term Goal 1 (Week 1): Pt will consume dys 3 textures and thin liquids with mod I use of swallowing precautions and minimal overt s/s of aspiration or fatigue over 3 consecutive  sessions.  SLP Short Term Goal 2 (Week 1): Pt will consume trials of regular textures with mod I use of swallowing precautions and no overt s/s of aspiration or fatigue over 3 consecutive sessions prior to advancement.    Refer to Care Plan for Long Term Goals  Recommendations for other services: None  Discharge Criteria: Patient will be discharged from SLP if patient refuses treatment 3 consecutive times without medical reason, if treatment goals not met, if there is a change in medical status, if patient makes no progress towards goals or if patient is discharged from hospital.  The above assessment, treatment plan, treatment alternatives and goals were discussed and mutually agreed upon: by patient and by family  Page, Nicole L 01/17/2016, 4:28 PM   

## 2016-01-17 NOTE — Progress Notes (Signed)
Initial Nutrition Assessment  DOCUMENTATION CODES:   Not applicable  INTERVENTION:  1. Diet advancement; as able per SLP recommendations. Recommend Regular diet if/once appropriate.  2.  Supplements; Ensure Enlive po BID, each supplement provides 350 kcal and 20 grams of protein   NUTRITION DIAGNOSIS:   Inadequate oral intake related to dysphagia as evidenced by per patient/family report.  GOAL:   Patient will meet greater than or equal to 90% of their needs  MONITOR:   PO intake, Supplement acceptance, Diet advancement  REASON FOR ASSESSMENT:   Malnutrition Screening Tool   ASSESSMENT:   RD drawn to patietn via malnutrition screening tool.  Met with patient and family at bedside.  Husband reports patient has not been eating well since starting Pureed diet.  Patient has been able to advance liquids from nectar to thin with good tolerance per his report.  She has been able to take sips of Ensure Enlive, however is not drinking full regimen.  Husband believes full diet advancement will help most with intake as patient is a picky eater and is not able to eat as she normally would on a Regular diet.  Discussed reasons for current diet- which both patient and husband understand- and the desire for both RD and SLP staff to see patient progress to a diet best tolerated and enjoyed by patient. Husband voices understanding.  Plan to continue current plan with goal of consuming 25-50% of each meal/supplement.   Diet Order:  DIET - DYS 1 Room service appropriate?: Yes with Assist; Fluid consistency:: Thin  Skin:     Last BM:  7/8  Height:   Ht Readings from Last 1 Encounters:  01/16/16 '5\' 7"'$  (1.702 m)    Weight:   Wt Readings from Last 1 Encounters:  01/17/16 143 lb 4.8 oz (65 kg)    Ideal Body Weight:   135 lbs  BMI:  Body mass index is 22.44 kg/(m^2).  Estimated Nutritional Needs:   Kcal:   1500-1700  Protein:   70-75g  Fluid:   2L  EDUCATION NEEDS:   No  education needs identified at this time  Weekend RD coverage:  Brynda Greathouse, MS RD LDN Weekend/After hours pager: [phone]

## 2016-01-17 NOTE — Progress Notes (Signed)
[place] PHYSICAL MEDICINE & REHABILITATION     PROGRESS NOTE  Subjective/Complaints:  Pt laying in bed this AM.  She had a very rough night due to bladder scans showing high volumes and limited amount of return with foley.  She complained of stomach and back pain and states she does not want any more tests done.    ROS: Denies CP, SOB, N/V/D.  Objective: Vital Signs: Blood pressure 116/63, pulse 98, temperature 98.4 F (36.9 C), temperature source Oral, resp. rate 18, height 5\' 7"  (1.702 m), weight 65 kg (143 lb 4.8 oz), SpO2 96 %. Dg Lumbar Spine 2-3 Views  01/16/2016  CLINICAL DATA:  Fall in June, left hip fracture, low back pain EXAM: LUMBAR SPINE - 2-3 VIEW COMPARISON:  None. FINDINGS: Evaluation is constrained by difficulty with patient positioning. Five lumbar-type vertebral bodies. Normal lumbar lordosis.  Suspected mild lumbar levoscoliosis. No evidence of fracture or dislocation. Vertebral body heights are maintained. Degenerative changes of the lower lumbar spine. Visualized bony pelvis appears intact. Left hip arthroplasty, incompletely visualized. IVC filter at the L3-4 level. IMPRESSION: No fracture or dislocation is seen. Degenerative changes of the lower lumbar spine. Electronically Signed   By: Julian Hy M.D.   On: 01/16/2016 19:22   Dg Abd 1 View  01/17/2016  CLINICAL DATA:  Acute onset of urinary retention. Initial encounter. EXAM: ABDOMEN - 1 VIEW COMPARISON:  None. FINDINGS: The visualized bowel gas pattern is unremarkable. Scattered air, stool and contrast filled loops of colon are seen; no abnormal dilatation of small bowel loops is seen to suggest small bowel obstruction. No free intra-abdominal air is identified, though evaluation for free air is limited on a single supine view. Mild degenerative change is noted along the lower lumbar spine. The patient's left hip arthroplasty is grossly unremarkable, though incompletely assessed. An IVC filter is noted. The  visualized lung bases are essentially clear. IMPRESSION: Unremarkable bowel gas pattern; no free intra-abdominal air seen. Moderate amount of stool noted in the colon. Electronically Signed   By: Garald Balding M.D.   On: 01/17/2016 02:19    Recent Labs  01/15/16 0350 01/17/16 0421  WBC 9.5 8.7  HGB 9.3* 9.8*  HCT 29.7* 30.0*  PLT 283 273    Recent Labs  01/15/16 0350 01/17/16 0421  NA 138 136  K 3.5 3.6  CL 108 104  GLUCOSE 114* 126*  BUN 15 11  CREATININE 0.42* 0.48  CALCIUM 8.7* 9.0   CBG (last 3)   Recent Labs  01/16/16 1736 01/16/16 2117 01/17/16 0639  GLUCAP 135* 147* 120*    Wt Readings from Last 3 Encounters:  01/17/16 65 kg (143 lb 4.8 oz)  01/16/16 63.4 kg (139 lb 12.4 oz)    Physical Exam:  BP 116/63 mmHg  Pulse 98  Temp(Src) 98.4 F (36.9 C) (Oral)  Resp 18  Ht 5\' 7"  (1.702 m)  Wt 65 kg (143 lb 4.8 oz)  BMI 22.44 kg/m2  SpO2 96% Constitutional: She appears well-developed and well-nourished. NAD.  HENT: Normocephalic and atraumatic.  Eyes: Conjunctivae and EOM are normal.  Cardiovascular: Normal rate and regular rhythm.  Respiratory: Effort normal and breath sounds normal. No stridor. No respiratory distress. She has no wheezes.  GI: Soft. Bowel sounds are normal. She exhibits distension. There is no tenderness.  Musculoskeletal: She exhibits edema and tenderness.  2-3+ edema left thigh--has difficulty mobilizing LLE.  Neurological: She is alert and oriented.  Able to follow commands without difficulty.  Motor:  B/l UE: 4+/5 proximal to distal RLE: Hip flexion 2+/5, knee extension 3/5, ankle dorsi/plantar flexion 5/5 LLE: Hip flexion 2+/5, knee extension 3/5, ankle dorsi/plantar flexion 5/5 Skin: Skin is warm and dry.  Presacral area with bogginess and tenderness  Left anterior hip incision healing well with steri-strips in place.  Psychiatric: Her mood appears anxious. She is slowed. Cognition and memory are normal.    Assessment/Plan: 1. Functional deficits secondary to debility after left hip fracture complicated by arterial bleed, hemorrhagic shock, bilateral PE, PEA, Atrial thrombus, GIB. medical issues. which require 3+ hours per day of interdisciplinary therapy in a comprehensive inpatient rehab setting. Physiatrist is providing close team supervision and 24 hour management of active medical problems listed below. Physiatrist and rehab team continue to assess barriers to discharge/monitor patient progress toward functional and medical goals.  Function:  Bathing Bathing position      Bathing parts      Bathing assist        Upper Body Dressing/Undressing Upper body dressing                    Upper body assist        Lower Body Dressing/Undressing Lower body dressing                                  Lower body assist        Toileting Toileting          Toileting assist     Transfers Chair/bed transfer             Locomotion Ambulation           Wheelchair          Cognition Comprehension Comprehension assist level: Follows basic conversation/direction with no assist  Expression Expression assist level: Expresses basic needs/ideas: With no assist  Social Interaction Social Interaction assist level: Interacts appropriately 90% of the time - Needs monitoring or encouragement for participation or interaction.  Problem Solving Problem solving assist level: Solves basic problems with no assist  Memory Memory assist level: More than reasonable amount of time    Medical Problem List and Plan: 1. Abnormality of gait, weakness, low endurance secondary to debility after left hip fracture complicated by arterial bleed, hemorrhagic shock, bilateral PE, PEA, Atrial thrombus, GIB. medical issues.  Begin CIR - Pt states she is too tired from previous day/night to participate and would not like any more tests done.  Encourage pt to participate.   2.  Bilateral PE/DVT Prophylaxis/Anticoagulation: Mechanical: Sequential compression devices, below knee Bilateral lower extremities. IVC filter in place. Hx of GIB. 3. Pain Management: Will schedule tylenol. Tramadol added for prn use.  4. Mood: Showing activation and humor. LCSW to follow for evaluation and support.  5. Neuropsych: This patient is capable of making decisions on her own behalf. 6. Skin/Wound Care: Routine pressure relief measures. Monitor incision daily for healing.  7. Fluids/Electrolytes/Nutrition: Monitor I/O.   BMP within acceptable range on 7/8 8. RA thrombus: Resolved post thrombolysis  9. PAF: In NSR on cardizem. Monitor HR bid.  10 GIB:H/H stable. Continue PPI.   Will continue to monitor for recurrent melena or hematochezia.  Confounding bladder scans.  KUB on 7/7 reviewed, showing moderate stool 11. ABLA:   Has been treated with multiple units PRBC post arterial hemorrhage/GIB.   Hb 9.8 on 7/8  Cont to monitor 12. Dysphagia: Tolerating diet advancement.  Po intake improving 13. Chronic low back pain: Lumbar films reviewed, 7/7 stable.  Added kpad for local measures 14. Constipation  Increased bowel reg  Will consider suppository tomorrow  LOS (Days) 1 A FACE TO FACE EVALUATION WAS PERFORMED  Ankit Lorie Phenix 01/17/2016 7:39 AM

## 2016-01-17 NOTE — Progress Notes (Signed)
Removed foley per verbal order from MD. 300 mL of cloudy amber urine, no odor. Patient refused therapy this morning. Placed note on the board to allow patient to rest until next scheduled session.

## 2016-01-17 NOTE — Plan of Care (Signed)
Problem: RH PAIN MANAGEMENT Goal: RH STG PAIN MANAGED AT OR BELOW PT'S PAIN GOAL Less than 3,on 1 to 3 scale.  Outcome: Not Progressing Rating pain at 8, comes down to a 6. Trying heat.

## 2016-01-17 NOTE — Plan of Care (Signed)
Problem: RH BLADDER ELIMINATION Goal: RH STG MANAGE BLADDER WITH ASSISTANCE STG Manage Bladder With Mod.Assistance  Outcome: Not Progressing Inability to void.  MD notified.   Problem: RH SKIN INTEGRITY Goal: RH STG MAINTAIN SKIN INTEGRITY WITH ASSISTANCE STG Maintain Skin Integrity With Mod. Assistance.  Outcome: Not Progressing Requires Max assistance with bed mobility and repositioning.

## 2016-01-17 NOTE — Evaluation (Signed)
Physical Therapy Assessment and Plan  Patient Details  Name: Bonnie Salazar MRN: [id number] Date of Birth: 12-29-1940  PT Diagnosis: Cognitive deficits, Difficulty walking, Edema, Low back pain, Muscle weakness and Pain in L hip Rehab Potential: Good ELOS: 2-3 weeks   Today's Date: 01/17/2016 PT Individual Time: 1416-1520 PT Individual Time Calculation (min): 64 min    Problem List:  Patient Active Problem List   Diagnosis Date Noted  . Debility 01/16/2016  . Pain aggravated by sitting   . Abnormality of gait   . S/p left hip fracture   . Pulmonary embolism without acute cor pulmonale ([place])   . Post-operative pain   . Depression   . Bleeding gastrointestinal   . PAF (paroxysmal atrial fibrillation) ([place])   . Acute blood loss anemia   . Dysphagia   . Chronic low back pain   . Female pelvic hematoma   . Hemorrhage   . Blood per rectum   . Pulmonary embolus, left ([place])   . Pulmonary embolus, right ([place])   . Acute respiratory failure with hypoxia ([place])   . PEA (Pulseless electrical activity) ([place])   . Right atrial thrombus ([place])   . Arterial hemorrhage   . Hypovolemic shock ([place])   . Hypokalemia   . Hypernatremia   . Gastrointestinal hemorrhage with melena   . Acute pulmonary embolus ([place])   . Atrial mass   . Bleeding   . Acute pulmonary embolism ([place]) 12/28/2015  . Atrial thrombus ([place])   . Acute respiratory failure ([place])   . Hemorrhagic shock   . Cardiac arrest ([place])   . Fracture of femoral neck, left ([place]) 12/26/2015  . Hip fracture ([place]) 12/25/2015  . Leukocytosis 12/25/2015  . Dehydration 12/25/2015    Past Medical History:  Past Medical History  Diagnosis Date  . Acute pulmonary embolism ([place]) 12/28/2015  . Cardiac arrest ([place]) 12/2015  . Fracture of femoral neck, left ([place]) 01/2016  . Acute respiratory failure with hypoxia ([place]) 01/2016  . Arterial hemorrhage 01/2016  . Atrial mass 01/2016  . Female pelvic hematoma 01/2016  . Hypernatremia 01/2016  .  Right atrial thrombus ([place]) 01/2016   Past Surgical History:  Past Surgical History  Procedure Laterality Date  . Back surgery    . 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 Ivc filter placement ([facility] hx)      Assessment & Plan Clinical Impression: Patient is a 75 y.o. female admitted on 12/25/15 after a fall with subsequent left femoral neck fracture. She underwent left anterior hip arthroplasty by Dr. Fredonia Highland. Post op course complicated by abdominal distension with hypotension due to bleeding from distal iliac artery. She underwent distal external iliac artery stent down to inguinal ligament. She was treated with 11u PRBC, 7 units FFP, 2 units PLT pheresis and cryoprecipitate for life threatening bleed. Dr. Hulen Skains consulted and recommended monitoring. She tolerated extubation on 06/17 and ortho recommended WBAT with ASA for DVT prophylaxis. BLE dopplers 6/17 were negative for DVT. 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 She developed respiratory distress with marked tachycardia on 06/18 requiring face mask. CTA chest done showing bilateral PE with completely occlusive thrombus in LLL PA with nearly occlusive segmental pulmonary arteries and right heart strain. Dr.  Bartle consulted and following for input. She underwent catheter lysis of intra-atrial clot by Dr. Earleen Newport. Repeat echo 06/19 revealed that RA thrombus no longer present.   Post procedure developed leg, back and abdominal pain with hypotension requiring neo and was transfused with 2 units PRBC. She was sedated and intubated for airway protection. She developed PEA cardiac arrest later that evening requiring 10 minutes of resuscitation. CTA abdomen/pelvis showed decrease in extraperitoneal  bleed, compression of left external iliac and comon iliac veins by pelvic hematoma, no new pelvic hematoma and enlarging hematoma proximal left thigh. CT head without acute changes. Heparin was discontinued and IVC filter placed on 06/21. Has been treated with antibiotics for fevers as well as IV diuresis for fluid overload. She tolerated extubation on 06/23 and continues to be NPO due to high aspiration risk. MBS done yesterday revealing oropharyngeal dysphagia with delay in swallow, moderate residue, fatigue and aspiration. She developed bloody stools on 06/26 and GI consulted for input. Dr. Silverio Decamp questioned ischemic colitis v/s hemorrhoids as cause of bleeding and supportive care recommended. She has had intermittent rectal bleeding felt to be due to hems. H/H has been relatively stable and leucocytosis has resolved. Repeat swallow evaluation done on 7/03 and she was started on dysphagia 1, nectar liquids. Tube feeds discontinued with resolution of hypernatremia and diarrhea. Patient continue to have DOE but mobility improving and patient remains motivated.  Patient transferred to CIR on 01/16/2016 .   Patient currently requires max with mobility secondary to muscle weakness and muscle joint tightness, decreased cardiorespiratoy endurance, decreased initiation, decreased attention, decreased problem solving and decreased memory and decreased sitting balance, decreased standing balance, decreased postural control and decreased balance strategies.  Prior to hospitalization, patient was independent  with mobility and lived with Spouse in a Other(Comment) (Condo) home.  Home access is  Level entry.  Patient will benefit from skilled PT intervention to maximize safe functional mobility, minimize fall risk and decrease caregiver burden for planned discharge home with 24 hour assist.  Anticipate patient will benefit from follow up [facility] at discharge.  PT - End of Session Activity Tolerance: Decreased this  session Endurance Deficit: Yes Endurance Deficit Description: pain, fatigue-deconditioning  PT Assessment Rehab Potential (ACUTE/IP ONLY): Good PT Patient demonstrates impairments in the following area(s): Balance;Edema;Motor;Endurance;Pain PT Transfers Functional Problem(s): Bed Mobility;Bed to Chair;Car;Furniture PT Locomotion Functional Problem(s): Ambulation;Wheelchair Mobility;Stairs PT Plan PT Intensity: Minimum of 1-2 x/day ,45 to 90 minutes PT Frequency: 5 out of 7 days PT Duration Estimated Length of Stay: 2-3 weeks PT Treatment/Interventions: Ambulation/gait training;Discharge planning;Functional mobility training;Psychosocial support;Therapeutic Activities;Balance/vestibular training;Neuromuscular re-education;Therapeutic Exercise;Wheelchair propulsion/positioning;Cognitive remediation/compensation;DME/adaptive equipment instruction;Pain management;Splinting/orthotics;UE/LE Strength taining/ROM;Community reintegration;Patient/family education;Stair training PT Transfers Anticipated Outcome(s): Supervision PT Locomotion Anticipated Outcome(s): Supervision PT Recommendation Recommendations for Other Services: Neuropsych consult Follow Up Recommendations: Home health PT;24 hour supervision/assistance Patient destination: Home Equipment Recommended: Wheelchair (measurements);Wheelchair cushion (measurements);Rolling walker with 5" wheels  Skilled Therapeutic Intervention Pt received in bed resting after SLP evaluation.  Pt reporting back pain with RN present to administer pain medication.  Pt agreeable to evaluation to tolerance; pt reporting not feeling well today.  Vitals assessed and all WFL.  Pt noted to have increased edema in LLE as compared to RLE and more edema than in days past per husband's report.  LLE also noted to be warm.  RN and MD alerted; TEDs donned.  Prior to pt transferring OOB performed LLE AAROM for edema management, pain management and ROM.  Performed supine > sit  and transfer to  w/c stand pivot as described below with max lifting and lowering assistance + max verbal cues to sequence sit <> stand and pivot with RW.  Pt not agreeable to performing gait or car transfer today but willing to attempt stairs.  Performed up/down one step as described below with therapist providing max encouragement and verbal cues to slow RR due to DOE and anxiety and for sequencing.  Returned to room and pt elected to return to bed; discussed with pt the importance of spending time OOB especially during meals as recommended by SLP.  Pt agreeable to get OOB for dinner.  Performed stand pivot back to bed max A and sit > supine max A.    PT Evaluation Precautions/Restrictions Precautions Precautions: Fall Restrictions Weight Bearing Restrictions: Yes LLE Weight Bearing: Weight bearing as tolerated General Chart Reviewed: Yes Response to Previous Treatment: Patient reporting fatigue but able to participate. Family/Caregiver Present: Yes  Vital SignsTherapy Vitals Temp: 98.8 F (37.1 C) Temp Source: Oral Pulse Rate: 96 Resp: 16 BP: 116/60 mmHg Patient Position (if appropriate): Sitting Oxygen Therapy SpO2: 100 % O2 Device: Not Delivered Pain Pain Assessment Pain Assessment: 0-10 Pain Score: 7  Pain Type: Chronic pain Pain Location: Back Pain Orientation: Right;Left;Lower Pain Descriptors / Indicators: Aching Pain Frequency: Constant Pain Onset: On-going Pain Intervention(s): Medication (See eMAR) Home Living/Prior Functioning Home Living Available Help at Discharge: Family;Available 24 hours/day Type of Home: Other(Comment) (Condo) Home Access: Level entry Home Layout: Two level;Multi-level;Full bath on main level;Able to live on main level with bedroom/bathroom Bathroom Shower/Tub: Walk-in shower;Door [employer] Toilet: Standard Bathroom Accessibility: Yes  Lives With: Spouse Prior Function Level of Independence: Independent with basic ADLs;Independent with  homemaking with ambulation;Independent with gait;Independent with transfers  Able to Take Stairs?: Yes Driving: Yes Vocation: Retired Magazine features editor: Appears Intact Stereognosis: Not tested Hot/Cold: Not tested Proprioception: Appears Intact Coordination Gross Motor Movements are Fluid and Coordinated: No Coordination and Movement Description: Limited by edema and pain Motor  Motor Motor: Other (comment) Motor - Skilled Clinical Observations: Generalized weakness and impaired sequencing  Mobility Bed Mobility Bed Mobility: Supine to Sit;Sit to Supine Supine to Sit: 2: Max assist Sit to Supine: 2: Max assist Transfers Transfers: Yes Stand Pivot Transfers: 2: Max assist Locomotion  Ambulation Ambulation: No Stairs / Additional Locomotion Stairs: Yes Stairs Assistance: 3: Mod assist Stairs Assistance Details (indicate cue type and reason): Pt performed one step negotiation forwards to ascend, backwards to descend with bilat UE support on rails and mod A for weight shifting and max verbal cues for safe step to sequence Stair Management Technique: Two rails;Step to pattern;Forwards;Backwards Number of Stairs: 1 Height of Stairs: 5 Architect: Yes Wheelchair Assistance: 1: +1 Total assist Wheelchair Parts Management: Needs assistance Distance: 150  Trunk/Postural Assessment  Cervical Assessment Cervical Assessment: Within Functional Limits Thoracic Assessment Thoracic Assessment: Within Functional Limits Lumbar Assessment Lumbar Assessment: Exceptions to [facility] (limited ROM: chronic back pain) Postural Control Postural Control: Deficits on evaluation  Balance Balance Balance Assessed: Yes Static Sitting Balance Static Sitting - Balance Support: Feet supported Static Sitting - Level of Assistance: 5: Stand by assistance Dynamic Sitting Balance Dynamic Sitting - Balance Support: Feet supported Dynamic Sitting - Level of  Assistance: 4: Min assist Static Standing Balance Static Standing - Balance Support: Right upper extremity supported;Left upper extremity supported Static Standing - Level of Assistance: 4: Min assist Dynamic Standing Balance Dynamic Standing - Balance Support: Right upper extremity supported;Left upper extremity supported Dynamic Standing -  Level of Assistance: 3: Mod assist Extremity Assessment  RLE Assessment RLE Assessment: Exceptions to WFL (3/5 overall) LLE Assessment LLE Assessment: Exceptions to [facility] (2/5; limited by edema and pain)   See Function Navigator for Current Functional Status.   Refer to Care Plan for Long Term Goals  Recommendations for other services: Neuropsych  Discharge Criteria: Patient will be discharged from PT if patient refuses treatment 3 consecutive times without medical reason, if treatment goals not met, if there is a change in medical status, if patient makes no progress towards goals or if patient is discharged from hospital.  The above assessment, treatment plan, treatment alternatives and goals were discussed and mutually agreed upon: by patient and by family  Malachy Mood 01/17/2016, 3:57 PM

## 2016-01-17 NOTE — Plan of Care (Signed)
Problem: RH BLADDER ELIMINATION Goal: RH STG MANAGE BLADDER WITH ASSISTANCE STG Manage Bladder With Mod.Assistance  Outcome: Not Progressing Incontinent of urine.  Requires total assistance for changing diaper/brief.

## 2016-01-17 NOTE — Evaluation (Signed)
Occupational Therapy Assessment and Plan  Patient Details  Name: Bonnie Salazar MRN: [id number] Date of Birth: 12/25/1940  OT Diagnosis: cognitive deficits, muscle weakness (generalized) and pain in lower back and L hip Rehab Potential: Rehab Potential (ACUTE ONLY): Fair ELOS: 19-21 days   Today's Date: 01/17/2016 OT Individual Time: 1115-1200 OT Individual Time Calculation (min): 45 min     Problem List:  Patient Active Problem List   Diagnosis Date Noted  . Debility 01/16/2016  . Pain aggravated by sitting   . Abnormality of gait   . S/p left hip fracture   . Pulmonary embolism without acute cor pulmonale (HCC)   . Post-operative pain   . Depression   . Bleeding gastrointestinal   . PAF (paroxysmal atrial fibrillation) (HCC)   . Acute blood loss anemia   . Dysphagia   . Chronic low back pain   . Female pelvic hematoma   . Hemorrhage   . Blood per rectum   . Pulmonary embolus, left (HCC)   . Pulmonary embolus, right (HCC)   . Acute respiratory failure with hypoxia (HCC)   . PEA (Pulseless electrical activity) (HCC)   . Right atrial thrombus (HCC)   . Arterial hemorrhage   . Hypovolemic shock (HCC)   . Hypokalemia   . Hypernatremia   . Gastrointestinal hemorrhage with melena   . Acute pulmonary embolus (HCC)   . Atrial mass   . Bleeding   . Acute pulmonary embolism (HCC) 12/28/2015  . Atrial thrombus (HCC)   . Acute respiratory failure (HCC)   . Hemorrhagic shock   . Cardiac arrest (HCC)   . Fracture of femoral neck, left (HCC) 12/26/2015  . Hip fracture (HCC) 12/25/2015  . Leukocytosis 12/25/2015  . Dehydration 12/25/2015    Past Medical History:  Past Medical History  Diagnosis Date  . Acute pulmonary embolism (HCC) 12/28/2015  . Cardiac arrest (HCC) 12/2015  . Fracture of femoral neck, left (HCC) 01/2016  . Acute respiratory failure with hypoxia (HCC) 01/2016  . Arterial hemorrhage 01/2016  . Atrial mass 01/2016  . Female pelvic hematoma 01/2016  .  Hypernatremia 01/2016  . Right atrial thrombus (HCC) 01/2016   Past Surgical History:  Past Surgical History  Procedure Laterality Date  . Back surgery    . 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 Ivc filter placement ([facility] hx)      Assessment & Plan Clinical Impression: Patient is a 74 y.o. year old female s/p fall with subsequent left femoral neck fracture. She underwent left anterior hip arthroplasty by Dr. Tim Murphy. Post op course complicated by abdominal distension with hypotension due to bleeding from distal iliac artery. She underwent distal external iliac artery stent down to inguinal ligament. She was treated with 11u PRBC, 7 units FFP, 2 units PLT pheresis and cryoprecipitate for life threatening bleed. Dr. Wyatt consulted and recommended monitoring. She tolerated extubation on 06/17 and ortho recommended WBAT with ASA for DVT prophylaxis. BLE dopplers 6/17 were negative for DVT. 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 She developed respiratory distress with marked tachycardia on 06/18 requiring face mask. CTA chest done showing bilateral PE with completely occlusive thrombus in LLL PA with nearly occlusive segmental pulmonary arteries and right heart strain.   Dr. Cyndia Bent consulted and following for input. She underwent catheter lysis of intra-atrial clot by Dr. Earleen Newport. Repeat echo 06/19 revealed that RA thrombus no longer present.   Post procedure developed leg, back and abdominal pain with hypotension requiring neo and was transfused with 2 units PRBC. She was sedated and intubated for airway protection. She developed PEA cardiac arrest later that evening requiring 10 minutes of resuscitation. CTA abdomen/pelvis showed decrease in  extraperitoneal bleed, compression of left external iliac and comon iliac veins by pelvic hematoma, no new pelvic hematoma and enlarging hematoma proximal left thigh. CT head without acute changes. Heparin was discontinued and IVC filter placed on 06/21. Has been treated with antibiotics for fevers as well as IV diuresis for fluid overload. She tolerated extubation on 06/23 and continues to be NPO due to high aspiration risk. MBS done yesterday revealing oropharyngeal dysphagia with delay in swallow, moderate residue, fatigue and aspiration. She developed bloody stools on 06/26 and GI consulted for input. Dr. Silverio Decamp questioned ischemic colitis v/s hemorrhoids as cause of bleeding and supportive care recommended. She has had intermittent rectal bleeding felt to be due to hems. H/H has been relatively stable and leucocytosis has resolved. Repeat swallow evaluation done on 7/03 and she was started on dysphagia 1, nectar liquids. Tube feeds discontinued with resolution of hypernatremia and diarrhea. Patient continue to have DOE but mobility improving and patient remains motivated. CIR recommended for follow up therapy and patient cleared medically for intensive rehab program.    Patient transferred to CIR on 01/16/2016 .    Patient currently requires max with basic self-care skills secondary to muscle weakness, decreased cardiorespiratoy endurance, decreased problem solving, decreased safety awareness, decreased memory and delayed processing and decreased sitting balance, decreased standing balance and decreased balance strategies.  Prior to hospitalization, patient could complete ADLs and IADLs with independent .  Patient will benefit from skilled intervention to decrease level of assist with basic self-care skills prior to discharge home with care partner.  Anticipate patient will require 24 hour supervision and minimal physical assistance and follow up home health.  OT - End of Session Activity  Tolerance: Decreased this session Endurance Deficit: Yes Endurance Deficit Description: pain, fatigue-deconditioning  OT Assessment Rehab Potential (ACUTE ONLY): Fair OT Patient demonstrates impairments in the following area(s): Balance;Cognition;Edema;Endurance;Motor;Pain;Safety OT Basic ADL's Functional Problem(s): Grooming;Bathing;Dressing;Toileting OT Transfers Functional Problem(s): Toilet;Tub/Shower OT Additional Impairment(s): None OT Plan OT Intensity: Minimum of 1-2 x/day, 45 to 90 minutes OT Frequency: 5 out of 7 days OT Duration/Estimated Length of Stay: 19-21 days OT Treatment/Interventions: Balance/vestibular training;Cognitive remediation/compensation;Community reintegration;Discharge planning;Pain management;UE/LE Coordination activities;Neuromuscular re-education;UE/LE Strength taining/ROM;Self Care/advanced ADL retraining;Functional mobility training;Psychosocial support;Therapeutic Exercise;Wheelchair propulsion/positioning;Therapeutic Activities;Patient/family education;DME/adaptive equipment instruction OT Self Feeding Anticipated Outcome(s): n/a OT Basic Self-Care Anticipated Outcome(s): mod I - min A OT Toileting Anticipated Outcome(s): supervision OT Bathroom Transfers Anticipated Outcome(s): min A - shower, supervision - toilet OT Recommendation Patient destination: Home Follow Up Recommendations: Home health OT Equipment Recommended: To be determined   Skilled Therapeutic Intervention OT making two attempts to evaluation pt secondary to pt feeling unwell and initially refusing. Pt with 6/10 c/o pain in lower back and RN notified for medications. OT educated pt and husband on OT purpose, POC, goals, and inpatient rehab expectations for participation. Pt verbalized understanding. Pt declined OOB therapy this session. Pt seated on EOB for bathing tasks. She also declined clothing and donned hospital gown instead. Pt incontinent of urine and returning to supine for  hygiene as pt refused to stand  this session. Pt rolled L <> R with max A to assist with hygiene. Pt needing multiple rest breaks secondary to fatigue this session.  Pt remained supine at end of session with call bell and all needed items within reach.    OT Evaluation Precautions/Restrictions  Precautions Precautions: Fall Restrictions Weight Bearing Restrictions: Yes LLE Weight Bearing: Weight bearing as tolerated General OT Amount of Missed Time: 15 Minutes Vital Signs Therapy Vitals BP: 103/61 mmHg Pain Pain Assessment Pain Assessment: 0-10 Pain Score: 6  Pain Type: Chronic pain Pain Location: Back Pain Orientation: Lower Pain Descriptors / Indicators: Aching Pain Onset: On-going Pain Intervention(s): Repositioned;Distraction;RN made aware Home Living/Prior Functioning Home Living Family/patient expects to be discharged to:: Private residence Living Arrangements: Spouse/significant other Available Help at Discharge: Family, Available 24 hours/day Type of Home: House Home Access: Level entry Home Layout: Two level, Multi-level, Full bath on main level, Able to live on main level with bedroom/bathroom Bathroom Shower/Tub: Walk-in shower, Door Bathroom Toilet: Standard Bathroom Accessibility: Yes  Lives With: Spouse Prior Function Level of Independence: Independent with basic ADLs, Independent with homemaking with ambulation, Independent with gait, Independent with transfers  Able to Take Stairs?: Yes Driving: Yes Vocation: Retired Vision/Perception  Vision- History Baseline Vision/History: Wears glasses Wears Glasses: Reading only Patient Visual Report: No change from baseline Vision- Assessment Vision Assessment?: No apparent visual deficits  Cognition Overall Cognitive Status: Difficult to assess Arousal/Alertness: Awake/alert Orientation Level: Person;Place;Situation Person: Oriented Place: Oriented Situation: Oriented Year: 2017 Month: July Day of Week:  Incorrect Memory: Appears intact Immediate Memory Recall: Sock;Blue;Bed Memory Recall: Sock;Blue;Bed Memory Recall Sock: Without Cue Memory Recall Blue: With Cue Memory Recall Bed: With Cue Selective Attention: Appears intact Sensation Sensation Light Touch: Appears Intact Stereognosis: Not tested Hot/Cold: Not tested Proprioception: Appears Intact Coordination Gross Motor Movements are Fluid and Coordinated: No Fine Motor Movements are Fluid and Coordinated: Yes Coordination and Movement Description: Limited by edema and pain Motor  Motor Motor: Other (comment) Motor - Skilled Clinical Observations: Generalized weakness and impaired sequencing Mobility  Bed Mobility Bed Mobility: Supine to Sit;Sit to Supine Supine to Sit: 2: Max assist Sit to Supine: 2: Max assist  Trunk/Postural Assessment  Cervical Assessment Cervical Assessment: Within Functional Limits Thoracic Assessment Thoracic Assessment: Within Functional Limits Lumbar Assessment Lumbar Assessment: Exceptions to WFL (chronic back pain) Postural Control Postural Control: Deficits on evaluation  Balance Balance Balance Assessed: Yes Static Sitting Balance Static Sitting - Balance Support: Feet supported Static Sitting - Level of Assistance: 5: Stand by assistance Dynamic Sitting Balance Dynamic Sitting - Balance Support: Feet supported Dynamic Sitting - Level of Assistance: 4: Min assist Static Standing Balance Static Standing - Balance Support: Right upper extremity supported;Left upper extremity supported Static Standing - Level of Assistance: 4: Min assist Dynamic Standing Balance Dynamic Standing - Balance Support: Right upper extremity supported;Left upper extremity supported Dynamic Standing - Level of Assistance: 3: Mod assist Extremity/Trunk Assessment RUE Assessment RUE Assessment: Within Functional Limits LUE Assessment LUE Assessment: Within Functional Limits   See Function Navigator for  Current Functional Status.   Refer to Care Plan for Long Term Goals  Recommendations for other services: None  Discharge Criteria: Patient will be discharged from OT if patient refuses treatment 3 consecutive times without medical reason, if treatment goals not met, if there is a change in medical status, if patient makes no progress towards goals or if patient is discharged from hospital.  The above assessment, treatment plan, treatment alternatives and goals were discussed and   mutually agreed upon: by patient and by family  Pittman, Katie L 01/17/2016, 12:50 PM  

## 2016-01-18 ENCOUNTER — Inpatient Hospital Stay (HOSPITAL_COMMUNITY): Payer: Medicare HMO

## 2016-01-18 ENCOUNTER — Inpatient Hospital Stay (HOSPITAL_COMMUNITY): Payer: Medicare HMO | Admitting: Physical Therapy

## 2016-01-18 DIAGNOSIS — K5901 Slow transit constipation: Secondary | ICD-10-CM | POA: Insufficient documentation

## 2016-01-18 DIAGNOSIS — R609 Edema, unspecified: Secondary | ICD-10-CM

## 2016-01-18 MED ORDER — POLYETHYLENE GLYCOL 3350 17 G PO PACK
17.0000 g | PACK | Freq: Every day | ORAL | Status: DC
  Administered 2016-01-18 – 2016-01-24 (×6): 17 g via ORAL
  Filled 2016-01-18 (×6): qty 1

## 2016-01-18 MED ORDER — DILTIAZEM HCL 30 MG PO TABS
30.0000 mg | ORAL_TABLET | Freq: Three times a day (TID) | ORAL | Status: DC
  Administered 2016-01-18 – 2016-01-19 (×3): 30 mg via ORAL
  Filled 2016-01-18 (×6): qty 1

## 2016-01-18 MED ORDER — BISACODYL 10 MG RE SUPP
10.0000 mg | Freq: Once | RECTAL | Status: AC
  Administered 2016-01-18: 10 mg via RECTAL
  Filled 2016-01-18: qty 1

## 2016-01-18 NOTE — IPOC Note (Addendum)
Overall Plan of Care [facility]) Patient Details Name: Bonnie Salazar MRN: [id number] DOB: [birth year]  Admitting Diagnosis: Lf Hip Fx  Hospital Problems: Active Problems:   Debility   Pain aggravated by sitting   Abnormality of gait   S/p left hip fracture   Pulmonary embolism without acute cor pulmonale (HCC)   Post-operative pain   Depression   Bleeding gastrointestinal   PAF (paroxysmal atrial fibrillation) (HCC)   Acute blood loss anemia   Dysphagia   Chronic low back pain   Slow transit constipation     Functional Problem List: Nursing Bladder, Bowel, Endurance, Motor, Nutrition, Pain, Safety, Skin Integrity  PT Balance, Edema, Motor, Endurance, Pain  OT Balance, Cognition, Edema, Endurance, Motor, Pain, Safety  SLP Nutrition  TR         Basic ADL's: OT Grooming, Bathing, Dressing, Toileting     Advanced  ADL's: OT       Transfers: PT Bed Mobility, Bed to Chair, Car, Manufacturing systems engineer, Metallurgist: PT Ambulation, Emergency planning/management officer, Stairs     Additional Impairments: OT None  SLP Swallowing      TR      Anticipated Outcomes Item Anticipated Outcome  Self Feeding n/a  Swallowing  mod I     Basic self-care  mod I - min A  Toileting  supervision   Bathroom Transfers min A - shower, supervision - toilet  Bowel/Bladder  Continent to bowel and bladder with mod. assisst.  Transfers  Supervision  Locomotion  Supervision  Communication     Cognition     Pain  Less than 3,on 1 to 10 scale.  Safety/Judgment  Free from fall durin her stay in rehab.   Therapy Plan: PT Intensity: Minimum of 1-2 x/day ,45 to 90 minutes PT Frequency: 5 out of 7 days PT Duration Estimated Length of Stay: 2-3 weeks OT Intensity: Minimum of 1-2 x/day, 45 to 90 minutes OT Frequency: 5 out of 7 days OT Duration/Estimated Length of Stay: 19-21 days SLP Intensity: Minumum of 1-2 x/day, 30 to 90 minutes SLP Frequency: 3 to 5 out of 7 days SLP  Duration/Estimated Length of Stay: 14-21 days        Team Interventions: Nursing Interventions Patient/Family Education, Bladder Management, Bowel Management, Disease Management/Prevention, Pain Management, Medication Management, Skin Care/Wound Management, Dysphagia/Aspiration Precaution Training, Discharge Planning  PT interventions Ambulation/gait training, Discharge planning, Functional mobility training, Psychosocial support, Therapeutic Activities, Balance/vestibular training, Neuromuscular re-education, Therapeutic Exercise, Wheelchair propulsion/positioning, Cognitive remediation/compensation, DME/adaptive equipment instruction, Pain management, Splinting/orthotics, UE/LE Strength taining/ROM, Community reintegration, Barrister's clerk education, IT trainer  OT Interventions Training and development officer, Cognitive remediation/compensation, Academic librarian, Discharge planning, Pain management, UE/LE Coordination activities, Neuromuscular re-education, UE/LE Strength taining/ROM, Self Care/advanced ADL retraining, Functional mobility training, Psychosocial support, Therapeutic Exercise, Wheelchair propulsion/positioning, Therapeutic Activities, Patient/family education, DME/adaptive equipment instruction  SLP Interventions Cueing hierarchy, Environmental controls, Dysphagia/aspiration precaution training, Functional tasks, Internal/external aids, Patient/family education  TR Interventions    SW/CM Interventions Discharge Planning, Psychosocial Support, Patient/Family Education    Team Discharge Planning: Destination: PT-Home ,OT- Home , SLP-Home Projected Follow-up: PT-Home health PT, 24 hour supervision/assistance, OT-  Home health OT, SLP-Other (comment) (TBD pending progress made while inpatient ) Projected Equipment Needs: PT-Wheelchair (measurements), Wheelchair cushion (measurements), Rolling walker with 5" wheels, OT- To be determined, SLP-  Equipment Details: PT- , OT-   Patient/family involved in discharge planning: PT- Patient, Family member/caregiver,  OT-Patient, Family member/caregiver, SLP-Patient, Family member/caregiver  MD ELOS: 18-22 days. Medical Rehab  Prognosis:  Good Assessment:  75 y.o. female admitted on 12/25/15 after a fall with subsequent left femoral neck fracture. She underwent left anterior hip arthroplasty by Dr. Fredonia Highland. Post op course complicated by abdominal distension with hypotension due to bleeding from distal iliac artery. She underwent distal external iliac artery stent down to inguinal ligament. She was treated with 11u PRBC, 7 units FFP, 2 units PLT pheresis and cryoprecipitate for life threatening bleed. Dr. Hulen Skains consulted and recommended monitoring. She tolerated extubation on 06/17 and ortho recommended WBAT with ASA for DVT prophylaxis. BLE dopplers 6/17 were negative for DVT. 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 She developed respiratory distress with marked tachycardia on 06/18 requiring face mask. CTA chest done showing bilateral PE with completely occlusive thrombus in LLL PA with nearly occlusive segmental pulmonary arteries and right heart strain. Dr. Cyndia Bent consulted and following for input. She underwent catheter lysis of intra-atrial clot by Dr. Earleen Newport. Repeat echo 06/19 revealed that RA thrombus no longer present.   Post procedure developed leg, back and abdominal pain with hypotension requiring neo and was transfused with 2 units PRBC. She was sedated and intubated for airway protection. She developed PEA cardiac arrest later that evening requiring 10 minutes of resuscitation. CTA abdomen/pelvis showed decrease in extraperitoneal bleed, compression of left external iliac and comon iliac veins by pelvic hematoma, no new pelvic hematoma and enlarging hematoma proximal left thigh. CT head without acute changes.  Heparin was discontinued and IVC filter placed on 06/21. Has been treated with antibiotics for fevers as well as IV diuresis for fluid overload. She tolerated extubation on 06/23 and continues to be NPO due to high aspiration risk. MBS done yesterday revealing oropharyngeal dysphagia with delay in swallow, moderate residue, fatigue and aspiration. She developed bloody stools on 06/26 and GI consulted for input. Dr. Silverio Decamp questioned ischemic colitis v/s hemorrhoids as cause of bleeding and supportive care recommended. She has had intermittent rectal bleeding felt to be due to hems. H/H has been relatively stable and leucocytosis has resolved. Repeat swallow evaluation done on 7/03 and she was started on dysphagia 1, nectar liquids. Tube feeds discontinued with resolution of hypernatremia and diarrhea. Patient continued to have DOE. Pt with resulting functional deficits in mobility, endurance, and ROM.  Will set goals for supervision with most tasks with therapies.   See Team Conference Notes for weekly updates to the plan of care

## 2016-01-18 NOTE — Progress Notes (Addendum)
[facility] PHYSICAL MEDICINE & REHABILITATION     PROGRESS NOTE  Subjective/Complaints:  Pt laying in bed this AM.  She states she had a good night last night, which was reiterated by nursing.  She is tired from therapies.    ROS: Denies CP, SOB, N/V/D.  Objective: Vital Signs: Blood pressure 110/75, pulse 94, temperature 98.4 F (36.9 C), temperature source Oral, resp. rate 18, height 5\' 7"  (1.702 m), weight 64.2 kg (141 lb 8.6 oz), SpO2 98 %. Dg Lumbar Spine 2-3 Views  01/16/2016  CLINICAL DATA:  Fall in June, left hip fracture, low back pain EXAM: LUMBAR SPINE - 2-3 VIEW COMPARISON:  None. FINDINGS: Evaluation is constrained by difficulty with patient positioning. Five lumbar-type vertebral bodies. Normal lumbar lordosis.  Suspected mild lumbar levoscoliosis. No evidence of fracture or dislocation. Vertebral body heights are maintained. Degenerative changes of the lower lumbar spine. Visualized bony pelvis appears intact. Left hip arthroplasty, incompletely visualized. IVC filter at the L3-4 level. IMPRESSION: No fracture or dislocation is seen. Degenerative changes of the lower lumbar spine. Electronically Signed   By: Julian Hy M.D.   On: 01/16/2016 19:22   Dg Abd 1 View  01/17/2016  CLINICAL DATA:  Acute onset of urinary retention. Initial encounter. EXAM: ABDOMEN - 1 VIEW COMPARISON:  None. FINDINGS: The visualized bowel gas pattern is unremarkable. Scattered air, stool and contrast filled loops of colon are seen; no abnormal dilatation of small bowel loops is seen to suggest small bowel obstruction. No free intra-abdominal air is identified, though evaluation for free air is limited on a single supine view. Mild degenerative change is noted along the lower lumbar spine. The patient's left hip arthroplasty is grossly unremarkable, though incompletely assessed. An IVC filter is noted. The visualized lung bases are essentially clear. IMPRESSION: Unremarkable bowel gas pattern; no free  intra-abdominal air seen. Moderate amount of stool noted in the colon. Electronically Signed   By: Garald Balding M.D.   On: 01/17/2016 02:19    Recent Labs  01/17/16 0421  WBC 8.7  HGB 9.8*  HCT 30.0*  PLT 273    Recent Labs  01/17/16 0421  NA 136  K 3.6  CL 104  GLUCOSE 126*  BUN 11  CREATININE 0.48  CALCIUM 9.0   CBG (last 3)   Recent Labs  01/16/16 2117 01/17/16 0639 01/17/16 1125  GLUCAP 147* 120* 102*    Wt Readings from Last 3 Encounters:  01/18/16 64.2 kg (141 lb 8.6 oz)  01/16/16 63.4 kg (139 lb 12.4 oz)    Physical Exam:  BP 110/75 mmHg  Pulse 94  Temp(Src) 98.4 F (36.9 C) (Oral)  Resp 18  Ht 5\' 7"  (1.702 m)  Wt 64.2 kg (141 lb 8.6 oz)  BMI 22.16 kg/m2  SpO2 98% Constitutional: She appears well-developed and well-nourished. NAD.  HENT: Normocephalic and atraumatic.  Eyes: Conjunctivae and EOM are normal.  Cardiovascular: Normal rate and regular rhythm.  Respiratory: Effort normal and breath sounds normal. No stridor. No respiratory distress. She has no wheezes.  GI: +Firm lower abdomen. She exhibits distension. There is no tenderness.  Musculoskeletal: She exhibits edema and tenderness.  Edema left thigh, relatively unchanged.  Neurological: She is alert and oriented.  Able to follow commands without difficulty.  Motor: B/l UE: 4+/5 proximal to distal RLE: Hip flexion 2+/5, knee extension 3/5, ankle dorsi/plantar flexion 5/5 LLE: Hip flexion 2+/5, knee extension 3/5, ankle dorsi/plantar flexion 5/5 Skin: Skin is warm and dry.  Presacral area  with bogginess and tenderness  Left anterior hip incision healing with steri-strips in place.  Psychiatric: Her mood appears anxious. She is slowed. Cognition and memory are normal.   Assessment/Plan: 1. Functional deficits secondary to debility after left hip fracture complicated by arterial bleed, hemorrhagic shock, bilateral PE, PEA, Atrial thrombus, GIB. medical issues. which require 3+ hours  per day of interdisciplinary therapy in a comprehensive inpatient rehab setting. Physiatrist is providing close team supervision and 24 hour management of active medical problems listed below. Physiatrist and rehab team continue to assess barriers to discharge/monitor patient progress toward functional and medical goals.  Function:  Bathing Bathing position   Position: Sitting EOB  Bathing parts Body parts bathed by patient: Right arm, Left arm, Chest, Abdomen, Front perineal area, Right upper leg, Left upper leg Body parts bathed by helper: Buttocks, Right lower leg, Left lower leg, Back  Bathing assist Assist Level:  (mod A)      Upper Body Dressing/Undressing Upper body dressing   What is the patient wearing?: Hospital gown                Upper body assist Assist Level: Set up   Set up : To obtain clothing/put away  Lower Body Dressing/Undressing Lower body dressing   What is the patient wearing?: Hospital Gown, Non-skid slipper socks           Non-skid slipper socks- Performed by helper: Don/doff right sock, Don/doff left sock                  Lower body assist Assist for lower body dressing: Set up      Toileting Toileting Toileting activity did not occur: No continent bowel/bladder event        Toileting assist     Transfers Chair/bed transfer Chair/bed transfer activity did not occur: Refused Chair/bed transfer method: Stand pivot Chair/bed transfer assist level: Maximal assist (Pt 25 - 49%/lift and lower) Chair/bed transfer assistive device: Medical sales representative Ambulation activity did not occur: Refused         Wheelchair   Type: Manual Max wheelchair distance: 100 Assist Level: Total assistance (Pt < 25%)  Cognition Comprehension Comprehension assist level: Follows basic conversation/direction with no assist  Expression Expression assist level: Expresses basic needs/ideas: With no assist  Social Interaction Social  Interaction assist level: Interacts appropriately 90% of the time - Needs monitoring or encouragement for participation or interaction.  Problem Solving Problem solving assist level: Solves basic problems with no assist  Memory Memory assist level: More than reasonable amount of time    Medical Problem List and Plan: 1. Abnormality of gait, weakness, low endurance secondary to debility after left hip fracture complicated by arterial bleed, hemorrhagic shock, bilateral PE, PEA, Atrial thrombus, GIB. medical issues.  Cont CIR - pt now more willing to participate   2. Bilateral PE/DVT Prophylaxis/Anticoagulation: Mechanical: Sequential compression devices, below knee Bilateral lower extremities. IVC filter in place. Hx of GIB.   Per nursing yesterday, LLE extremely warm with increased edema and erythema.  U/S ordered, pending.  Relatively unremarkable today compared to previous days and contralateral extremity. 3. Pain Management: Will schedule tylenol. Tramadol added for prn use.  4. Mood: Showing activation and humor. LCSW to follow for evaluation and support.  5. Neuropsych: This patient is capable of making decisions on her own behalf. 6. Skin/Wound Care: Routine pressure relief measures. Monitor incision daily for healing.  7. Fluids/Electrolytes/Nutrition: Monitor I/O.   BMP  within acceptable range on 7/8  Advanced to dysphagia 3, thin diet, will cont to advance as tolerated 8. RA thrombus: Resolved post thrombolysis  9. PAF: In NSR on cardizem. Monitor HR bid.  10 GIB:H/H stable. Continue PPI.   Will continue to monitor for recurrent melena or hematochezia.  Confounding bladder scans.  KUB on 7/7 reviewed, showing moderate stool 11. ABLA:   Has been treated with multiple units PRBC post arterial hemorrhage/GIB.   Hb 9.8 on 7/8  Cont to monitor 12. Dysphagia: Tolerating diet advancement. Po intake improving 13. Chronic low back pain: Lumbar films reviewed, 7/7 stable.  Added  kpad for local measures 14. Constipation  Increased bowel reg again on 7/9  Suppository ordered  LOS (Days) 2 A FACE TO FACE EVALUATION WAS PERFORMED  Bonnie Salazar 01/18/2016 6:57 AM

## 2016-01-18 NOTE — Progress Notes (Signed)
Occupational Therapy Session Note  Patient Details  Name: Bonnie Salazar MRN: [id number] Date of Birth: [birth year]  Today's Date: 01/18/2016 OT Individual Time: 0800-0900 OT Individual Time Calculation (min): 60 min    Short Term Goals: Week 1:  OT Short Term Goal 1 (Week 1): Pt will engage in 5 minutes of functional task before needing rest break secondary to fatigue.  OT Short Term Goal 2 (Week 1): Pt will perform toilet transfer with mod A in order to increase I with functional transfers.  OT Short Term Goal 3 (Week 1): Pt will perform UB dressing with set up A in order to increase I with self care.  OT Short Term Goal 4 (Week 1): Pt will perform LB dressing with max A in order to decrease level of assistance with self care.  Skilled Therapeutic Interventions/Progress Updates: ADL-retraining with focus on improved activity tolerance, bed mobility, standing tolerance, transfers (bed>BSC>w/c), and adapted bathing skills.   With extra time, vc and tactile cues to initiate, pt completed bed mobility and rose to sit at EOB and perform transfer to [facility] with overall Mod assist although max assist provided initially on first trial.   After first attempt, pt completed sit to stand with mod assist and required vc for technique to lower safely onto [facility].   Pt voided urine and medium BM but required max assist to perform hygiene while standing supported at [place]; pt attempted wiping using anterior approach while seated on BSC and was unaware of voiding BM.   Pt then washed upper body with setup but refused dressing due to fatigue and requested only fresh gown.   Pt completed sit<>stand 2 times to allow OT to provide new brief and to replace BSC with W/C.   Pt groomed at sink and completed oral hygiene and washed her hair with setup and min assist to massage shampoo cap.     Therapy Documentation Precautions:  Precautions Precautions: Fall Restrictions Weight Bearing Restrictions: Yes LLE Weight Bearing:  Weight bearing as tolerated  Vital Signs: Therapy Vitals BP: (!) 101/57 mmHg   Pain: Pain Assessment Pain Assessment: No/denies pain Pain Score: 3   See Function Navigator for Current Functional Status.   Therapy/Group: Individual Therapy  Bladyn Tipps 01/18/2016, 12:15 PM

## 2016-01-18 NOTE — Progress Notes (Signed)
*  Preliminary Results* Bilateral lower extremity venous duplex completed. Bilateral lower extremities are positive for acute deep vein thrombosis involving the right common femoral, right femoral, right profunda femoral, right popliteal, right peroneal, right posterior tibial, right gastrocnemius, left saphenofemoral junction, left femoral, left popliteal, left posterior tibial, left peroneal, and left gastrocnemius veins. There is no evidence of Baker's cyst bilaterally.  Preliminary results discussed with Ramiro Harvest, RN.  01/18/2016  Maudry Mayhew, RVT, RDCS, RDMS

## 2016-01-18 NOTE — Progress Notes (Signed)
Vascular lab reported extensive bilateral DVTs. Notified MD. Ordered to discontinue SCDs, remove TEDS and leave off til morning and have patient stay on bed rest. Staff notified that patient remains on bed rest, SCDs removed from room. Will continue to monitor and pass to next RN.

## 2016-01-18 NOTE — Progress Notes (Signed)
Brief note:  Lower extremity Doppler's reviewed. Patient with extensive bilateral lower extremity DVT's. However, patient also has history of Recurrent and significant, life-threatening G.I. Bleed. Will place on bedrest for now and re-address with primary team tomorrow. IVC filter in place.  SCDs DCed.

## 2016-01-18 NOTE — Progress Notes (Signed)
Patient complaining of lightheadedness, disorientation and fatigue following Cardizem doses. Encouraged patient to take does today and address with MD in the morning to see if alternatives are available. Called pharmacy to reschedule dose until after dinner so patient can eat without feeling bad.

## 2016-01-18 NOTE — Progress Notes (Signed)
Physical Therapy Session Note  Patient Details  Name: Bonnie Salazar MRN: [id number] Date of Birth: [birth year]  Today's Date: 01/18/2016 PT Individual Time: 1005-1101 PT Individual Time Calculation (min): 56 min   Short Term Goals: Week 1:  PT Short Term Goal 1 (Week 1): Pt will perform bed mobility with mod A and 50% cues for initiation and sequencing PT Short Term Goal 2 (Week 1): Pt will perform bed <> chair transfers stand pivot with mod A and 50% cues to initiate and sequence PT Short Term Goal 3 (Week 1): Pt will perform gait x 50' with RW and mod A  PT Short Term Goal 4 (Week 1): Pt will negotiate 4 stairs with 2 rails for LE strengthening and mod A PT Short Term Goal 5 (Week 1): Pt will perform w/c mobility x 100' with bilat UE for UE strengthening and endurance  Skilled Therapeutic Interventions/Progress Updates:    Pt received in w/c with husband present who excused himself upon PT arrival, but joined session later. Pt denied c/o pain, stated "I don't like this place", but not oriented to current location (reported "hospital" in "[place]"). Therapist reoriented patient to [facility] and spent initial portion of session building rapport with patient, discussing activities (cleaning, daily walks) that she enjoyed PTA, and goal setting of returning to those tasks. Pt reports feeling overwhelmed when thinking about returning to PLOF, as well as impatient, with pt becoming tearful. PT provided therapeutic support, listening & encouragement. Pt performed BLE long arc quads for ROM and strengthening. Pt able to complete sit<>stand transfers with max A requiring cuing to scoot forward in chair & to scoot feet back. Gait training x 10 ft + 10 ft with RW & Min A; HR = 91 bpm & SpO2 = 98% afterwards. Pt reports "that just takes it right out of me" & noted slight SOB after gait training. Pt required significant seated rest breaks & encouragement throughout session. Pt propelled w/c with BUE back  towards room x 75 ft with therapist providing cuing for direction back to room. At end of session pt left sitting in w/c with all needs within reach & husband present to supervise.  Therapy Documentation Precautions:  Precautions Precautions: Fall Restrictions Weight Bearing Restrictions: Yes LLE Weight Bearing: Weight bearing as tolerated  Pain: Pain Assessment Pain Assessment: No/denies pain  See Function Navigator for Current Functional Status.   Therapy/Group: Individual Therapy  Waunita Schooner 01/18/2016, 7:56 AM

## 2016-01-18 NOTE — Progress Notes (Signed)
Patient slept quietly without complaint during the night.  Incontinent of urine and smear of stool.  Patient cleaned and repositioned without incident.  Left leg with non pitting edema present.  Patient states that her leg/hip isn't bothering her but rather her back. K-pad in use and provides relief.  Remains anxious regarding therapy and rehab stay.  Provided encouragement.  Call bell within reach.    Bonnie Salazar M

## 2016-01-19 ENCOUNTER — Inpatient Hospital Stay (HOSPITAL_COMMUNITY): Payer: Medicare HMO | Admitting: Speech Pathology

## 2016-01-19 ENCOUNTER — Inpatient Hospital Stay (HOSPITAL_COMMUNITY): Payer: Medicare HMO

## 2016-01-19 ENCOUNTER — Inpatient Hospital Stay (HOSPITAL_COMMUNITY): Payer: Medicare HMO | Admitting: Occupational Therapy

## 2016-01-19 DIAGNOSIS — R9341 Abnormal radiologic findings on diagnostic imaging of renal pelvis, ureter, or bladder: Secondary | ICD-10-CM

## 2016-01-19 DIAGNOSIS — I82403 Acute embolism and thrombosis of unspecified deep veins of lower extremity, bilateral: Secondary | ICD-10-CM

## 2016-01-19 LAB — URINALYSIS, ROUTINE W REFLEX MICROSCOPIC
Bilirubin Urine: NEGATIVE
GLUCOSE, UA: NEGATIVE mg/dL
KETONES UR: NEGATIVE mg/dL
Nitrite: NEGATIVE
PH: 6 (ref 5.0–8.0)
PROTEIN: 100 mg/dL — AB
Specific Gravity, Urine: 1.015 (ref 1.005–1.030)

## 2016-01-19 LAB — URINE MICROSCOPIC-ADD ON

## 2016-01-19 LAB — GLUCOSE, CAPILLARY
GLUCOSE-CAPILLARY: 144 mg/dL — AB (ref 65–99)
Glucose-Capillary: 125 mg/dL — ABNORMAL HIGH (ref 65–99)

## 2016-01-19 MED ORDER — CEPHALEXIN 250 MG PO CAPS
250.0000 mg | ORAL_CAPSULE | Freq: Three times a day (TID) | ORAL | Status: DC
  Administered 2016-01-19 – 2016-01-22 (×9): 250 mg via ORAL
  Filled 2016-01-19 (×9): qty 1

## 2016-01-19 NOTE — Progress Notes (Signed)
Social Work  Social Work Assessment and Plan  Patient Details  Name: Bonnie Salazar MRN: [id number] Date of Birth: 1941/02/04  Today's Date: 01/19/2016  Problem List:  Patient Active Problem List   Diagnosis Date Noted  . Slow transit constipation   . Debility 01/16/2016  . Pain aggravated by sitting   . Abnormality of gait   . S/p left hip fracture   . Pulmonary embolism without acute cor pulmonale ([place])   . Post-operative pain   . Depression   . Bleeding gastrointestinal   . PAF (paroxysmal atrial fibrillation) ([place])   . Acute blood loss anemia   . Dysphagia   . Chronic low back pain   . Female pelvic hematoma   . Hemorrhage   . Blood per rectum   . Pulmonary embolus, left ([place])   . Pulmonary embolus, right ([place])   . Acute respiratory failure with hypoxia ([place])   . PEA (Pulseless electrical activity) ([place])   . Right atrial thrombus ([place])   . Arterial hemorrhage   . Hypovolemic shock ([place])   . Hypokalemia   . Hypernatremia   . Gastrointestinal hemorrhage with melena   . Acute pulmonary embolus ([place])   . Atrial mass   . Bleeding   . Acute pulmonary embolism ([place]) 12/28/2015  . Atrial thrombus ([place])   . Acute respiratory failure ([place])   . Hemorrhagic shock   . Cardiac arrest ([place])   . Fracture of femoral neck, left ([place]) 12/26/2015  . Hip fracture ([place]) 12/25/2015  . Leukocytosis 12/25/2015  . Dehydration 12/25/2015   Past Medical History:  Past Medical History  Diagnosis Date  . Acute pulmonary embolism ([place]) 12/28/2015  . Cardiac arrest ([place]) 12/2015  . Fracture of femoral neck, left ([place]) 01/2016  . Acute respiratory failure with hypoxia ([place]) 01/2016  . Arterial hemorrhage 01/2016  . Atrial mass 01/2016  . Female pelvic hematoma 01/2016  . Hypernatremia 01/2016  . Right atrial thrombus ([place]) 01/2016   Past Surgical History:  Past Surgical History  Procedure Laterality Date  . Back surgery    . 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 Ivc filter placement ([facility] hx)     Social History:  reports that she has quit smoking. She quit smokeless tobacco use about 20 years ago. She reports that she does not drink alcohol or use illicit drugs.  Family / Support Systems Marital Status: Married How Long?: 57 yrs Patient Roles: Spouse, Partner Spouse/Significant Other: spouse, Bonnie Salazar @ (H) [phone] or (C) [phone] Children: pt and spouse report that they have adult children but none are living locally Anticipated Caregiver: husband, Bonnie Salazar Ability/Limitations of Caregiver: no limitations Caregiver Availability: 24/7 Family Dynamics: Husband has been staying with pt at the hospital and very involved in her care.  Social History Preferred language: English Religion: Methodist Cultural Background: Na Read: Yes Write: Yes Employment Status: Retired Freight forwarder Issues: None Guardian/Conservator: None - per MD, pt is capable of making decisions on her own behalf   Abuse/Neglect Physical Abuse: Denies Verbal Abuse: Denies Sexual Abuse: Denies Exploitation of patient/patient's resources: Denies Self-Neglect: Denies  Emotional Status Pt's affect, behavior adn adjustment status: Pt lying in bed and frail appearing.  Soft voice and flat affect as she reluctantly agrees to assessment interview. She is able to provide info without difficulty with husband  adding info as he felt the need to.  Both report some frustration with her multiple medical complications since initial admit.  Pt denies any significant emotional distress beyond her noted "frustration".  Will monitor and consider referral for neuropsychology consult. Recent Psychosocial Issues: None Pyschiatric History: None Substance Abuse History: None  Patient / Family Perceptions, Expectations & Goals Pt/Family  understanding of illness & functional limitations: Pt and husband have general understanding of her hip fx and functional limitations.  They do report that they are awaiting input from other MDs regarding medications for DVTs. Premorbid pt/family roles/activities: Pt was completely independent Anticipated changes in roles/activities/participation: Pt has supervision goals and husband able to provide this and assume primary caregiver duties. Pt/family expectations/goals: Pt focused on her current medical situation and awaiting direction from other MDs.  She states, "I just want to get out of here."  [employer]: None Premorbid Home Care/DME Agencies: None Transportation available at discharge: yes Resource referrals recommended: Neuropsychology  Discharge Planning Living Arrangements: Spouse/significant other Support Systems: Spouse/significant other Type of Residence: Private residence Insurance underwriter Resources: Barrister's clerk) Museum/gallery curator Resources: Radio broadcast assistant Screen Referred: No Living Expenses: Own Money Management: Spouse Does the patient have any problems obtaining your medications?: No Home Management: pt and spouse Patient/Family Preliminary Plans: Pt to return home with spouse who can provide 24/7 assistance. Social Work Anticipated Follow Up Needs: HH/OP Expected length of stay: 18-21 days  Clinical Impression Unfortunate woman here following a fall at home, hip fx, surgery and then additional medical complications following.  Husband at bedside and very involved.  Both appear frustrated with their current situation and awaiting further MD direction in care of DVTs.  Pt's affect flat and not very talkative. Will follow for support (may need neuropsychology consult) and d/c planning needs.  Bonnie Salazar 01/19/2016, 2:21 PM

## 2016-01-19 NOTE — Care Management Note (Signed)
[facility] Individual Statement of Services  Patient Name:  Bonnie Salazar  Date:  01/19/2016  Welcome to the [facility].  Our goal is to provide you with an individualized program based on your diagnosis and situation, designed to meet your specific needs.  With this comprehensive rehabilitation program, you will be expected to participate in at least 3 hours of rehabilitation therapies Monday-Friday, with modified therapy programming on the weekends.  Your rehabilitation program will include the following services:  Physical Therapy (PT), Occupational Therapy (OT), Speech Therapy (ST), 24 hour per day rehabilitation nursing, Therapeutic Recreaction (TR), Neuropsychology, Case Management (Social Worker), Rehabilitation Medicine, Nutrition Services and Pharmacy Services  Weekly team conferences will be held on Tuesdays to discuss your progress.  Your Social Worker will talk with you frequently to get your input and to update you on team discussions.  Team conferences with you and your family in attendance may also be held.  Expected length of stay: 19-21 days  Overall anticipated outcome: supervision  Depending on your progress and recovery, your program may change. Your Social Worker will coordinate services and will keep you informed of any changes. Your Social Worker's name and contact numbers are listed  below.  The following services may also be recommended but are not provided by the [facility] will be made to provide these services after discharge if needed.  Arrangements include referral to agencies that provide these services.  Your insurance has been verified to be:  [employer] Your primary doctor is:  [place], [place]  Pertinent information will be shared with your doctor and your insurance  company.  Social Worker:  [place], [place] or [phone]   Information discussed with and copy given to patient by: Lennart Pall, 01/19/2016, 2:24 PM

## 2016-01-19 NOTE — Progress Notes (Signed)
Recreational Therapy Session Note  Patient Details  Name: Bonnie Salazar MRN: [id number] Date of Birth: 1940/07/14 Today's Date: 01/19/2016   Order received, eval deferred at this time.  Will continue to monitor through team for future participation. Elizabeth 01/19/2016, 2:38 PM

## 2016-01-19 NOTE — Progress Notes (Signed)
PHYSICAL MEDICINE & REHABILITATION     PROGRESS NOTE  Subjective/Complaints:  Pt laying in bed this AM.  She slept well overnight.  She states she feels fine this AM. Yesterday, LE U/S showed b/l DVTs, will speak to GI that are familiar with pt today to determine most appropriate management.  Pt also notes that cardizem makes her "spacey" and requests change in medication.    ROS: Denies CP, SOB, N/V/D.  Objective: Vital Signs: Blood pressure 102/54, pulse 101, temperature 98.9 F (37.2 C), temperature source Oral, resp. rate 18, height 5\' 7"  (1.702 m), weight 64.8 kg (142 lb 13.7 oz), SpO2 96 %. No results found.  Recent Labs  01/17/16 0421  WBC 8.7  HGB 9.8*  HCT 30.0*  PLT 273    Recent Labs  01/17/16 0421  NA 136  K 3.6  CL 104  GLUCOSE 126*  BUN 11  CREATININE 0.48  CALCIUM 9.0   CBG (last 3)   Recent Labs  01/16/16 2117 01/17/16 0639 01/17/16 1125  GLUCAP 147* 120* 102*    Wt Readings from Last 3 Encounters:  01/19/16 64.8 kg (142 lb 13.7 oz)  01/16/16 63.4 kg (139 lb 12.4 oz)    Physical Exam:  BP 102/54 mmHg  Pulse 101  Temp(Src) 98.9 F (37.2 C) (Oral)  Resp 18  Ht 5\' 7"  (1.702 m)  Wt 64.8 kg (142 lb 13.7 oz)  BMI 22.37 kg/m2  SpO2 96% Constitutional: She appears well-developed and well-nourished. NAD.  HENT: Normocephalic and atraumatic.  Eyes: Conjunctivae and EOM are normal.  Cardiovascular: Normal rate and regular rhythm.  Respiratory: Effort normal and breath sounds normal. No stridor. No respiratory distress. She has no wheezes.  GI: +Firm lower abdomen. She exhibits distension. There is no tenderness.  Musculoskeletal: She exhibits edema and tenderness.  Edema LLE.  Neurological: She is alert and oriented.  Able to follow commands without difficulty.  Motor: B/l UE: 4+/5 proximal to distal RLE: Hip flexion 2+/5, knee extension 3/5, ankle dorsi/plantar flexion 5/5 LLE: Hip flexion 2+/5, knee extension 3/5, ankle  dorsi/plantar flexion 5/5 Skin: Skin is warm and dry.  Presacral area with bogginess and tenderness  Left anterior hip incision healing with steri-strips in place.  Psychiatric: She is slowed. Cognition and memory are normal.   Assessment/Plan: 1. Functional deficits secondary to debility after left hip fracture complicated by arterial bleed, hemorrhagic shock, bilateral PE, PEA, Atrial thrombus, GIB. medical issues. which require 3+ hours per day of interdisciplinary therapy in a comprehensive inpatient rehab setting. Physiatrist is providing close team supervision and 24 hour management of active medical problems listed below. Physiatrist and rehab team continue to assess barriers to discharge/monitor patient progress toward functional and medical goals.  Function:  Bathing Bathing position   Position: Wheelchair/chair at sink  Bathing parts Body parts bathed by patient: Right arm, Left arm, Chest, Abdomen Body parts bathed by helper: Buttocks, Back  Bathing assist Assist Level: Touching or steadying assistance(Pt > 75%)      Upper Body Dressing/Undressing Upper body dressing Upper body dressing/undressing activity did not occur: Refused What is the patient wearing?: Hospital gown                Upper body assist Assist Level: Set up   Set up : To obtain clothing/put away  Lower Body Dressing/Undressing Lower body dressing   What is the patient wearing?: Non-skid slipper socks, Ted Hose           Non-skid  slipper socks- Performed by helper: Don/doff right sock, Don/doff left sock               TED Hose - Performed by helper: Don/doff right TED hose, Don/doff left TED hose  Lower body assist Assist for lower body dressing:  (max a)      Toileting Toileting Toileting activity did not occur: No continent bowel/bladder event   Toileting steps completed by helper: Adjust clothing prior to toileting, Performs perineal hygiene, Adjust clothing after  toileting Toileting Assistive Devices: Grab bar or rail  Toileting assist Assist level: More than reasonable time, Two helpers   Transfers Chair/bed transfer Chair/bed transfer activity did not occur: Refused Chair/bed transfer method: Stand pivot Chair/bed transfer assist level: Moderate assist (Pt 50 - 74%/lift or lower) Chair/bed transfer assistive device: Medical sales representative Ambulation activity did not occur: Refused   Max distance: 10 Assist level: Touching or steadying assistance (Pt > 75%)   Wheelchair   Type: Manual Max wheelchair distance: 75 ft Assist Level: Touching or steadying assistance (Pt > 75%)  Cognition Comprehension Comprehension assist level: Follows basic conversation/direction with no assist  Expression Expression assist level: Expresses basic needs/ideas: With no assist  Social Interaction Social Interaction assist level: Interacts appropriately 90% of the time - Needs monitoring or encouragement for participation or interaction.  Problem Solving Problem solving assist level: Solves basic problems with no assist  Memory Memory assist level: More than reasonable amount of time    Medical Problem List and Plan: 1. Abnormality of gait, weakness, low endurance secondary to debility after left hip fracture complicated by arterial bleed, hemorrhagic shock, bilateral PE, PEA, Atrial thrombus, GIB. medical issues.  On bed rest until conversation with GI    2. Bilateral PE/DVT Prophylaxis/Anticoagulation:   SCDs d/ced due to clots  IVC filter in place.   Hx of GIB.   LE U/S showing extensive b/l DVTs, will speak to GI most appropriate management.   3. Pain Management: Will schedule tylenol. Tramadol added for prn use.  4. Mood: Showing activation and humor. LCSW to follow for evaluation and support.  5. Neuropsych: This patient is capable of making decisions on her own behalf. 6. Skin/Wound Care: Routine pressure relief measures. Monitor  incision daily for healing.  7. Fluids/Electrolytes/Nutrition: Monitor I/O.   BMP within acceptable range on 7/8  Advanced to dysphagia 3, thin diet, will cont to advance as tolerated 8. RA thrombus: Resolved post thrombolysis  9. PAF: In NSR on Cardizem, will consult cardiology as pt and husband request change in medication due to drowsiness.   Monitor HR bid.  10 GIB:H/H stable. Continue PPI.   Will continue to monitor for recurrent melena or hematochezia.  Confounding bladder scans.  KUB on 7/7 reviewed, showing moderate stool 11. ABLA:   Has been treated with multiple units PRBC post arterial hemorrhage/GIB.   Hb 9.8 on 7/8  Cont to monitor 12. Dysphagia: Tolerating diet advancement. Po intake improving 13. Chronic low back pain: Lumbar films reviewed, 7/7 stable.  Added kpad for local measures 14. Constipation  Increased bowel reg again on 7/9  Suppository ordered on 7/9  LOS (Days) 3 A FACE TO FACE EVALUATION WAS PERFORMED  Ankit Lorie Phenix 01/19/2016 8:04 AM

## 2016-01-19 NOTE — Consult Note (Signed)
[facility]    HEMATOLOGY/ONCOLOGY CONSULTATION NOTE  Date of Service: 01/19/2016  Patient Care Team: Aletha Halim, PA-C as PCP - General (Family Medicine)  CHIEF COMPLAINTS/PURPOSE OF CONSULTATION:  Extensive bilateral DVT.  HISTORY OF PRESENTING ILLNESS:  Bonnie Salazar is a wonderful 75 y.o. female who has been referred to [place] by Dr Delice Lesch MD for evaluation and management of bilateral extensive DVTs and PE.  Patient apparently had no significant past medical history prior to admission on 12/25/2015 with an accidental fall and a left femoral neck fracture. She had operative repair on 12/26/2015 and the initial repair was uneventful and she was extubated. Postoperatively however she complained of abdominal pain and was noted to have a tense abdomen with a CT scan of the abdomen showing bleeding into the left lower quadrant. She was taken emergently for an interventional radiology evaluation and underwent covered stent placement in her distal external iliac artery to the inguinal ligament. Vascular surgery was consulted and no signs of distal ischemia were noted. She had a complicated admission and received multiple units of PRBCs, FFP, platelets and cryoprecipitate and was also in the ICU on mechanical ventilation. (- 6/16 transfuse to 14 units PRBC, 8 units FFP, 2 units cryoprecipitate, 2 units platelets - 6/18 transfused 2 units PRBC, 8 units FFP - 6/21 transfused 2 units PRBC). ICU course was complicated by bilateral acute pulmonary embolism with right ventricular strain. Venous Doppler of the lower extremity showed no evidence of DVT on 12/27/2015. Patient underwent thrombolytics by interventional radiology and IVC filter was placed. She was also noted to have a right atrial thrombus which was thought to be propagated from the right pelvic vein compression from hematoma and was thought to be the cause of her bilateral pulmonary embolisms. This was noted to have resolved after  thrombolytics.  She suffered a PEA arrest on 12/28/2015 (about 10 minutes) with troponin elevation. This happened post thrombolysis as per notes.  Patient had developed hypovolemic/hemorrhagic shock which eventually resolved on 12/31/2015.  On 01/06/2016 the patient developed significant melena and diarrhea and was started on high-dose Protonix twice a day. GI was consulted and did not believe this was ischemic colitis and recommended watchful waiting.  Patient also has a history of paroxysmal atrial fibrillation and severe mitral regurgitation and was previously on anticoagulation but this was held due to the patient's multiple sites of bleeding.  Patient has had issues with dysphagia and protein calorie malnutrition. She was discharged to acute inpatient rehabilitation.  Was sent out with IVC filter and sequential compression devices for DVT prophylaxis. Orthopedics recommended aspirin after discharge.  After an extensive and complicated inpatient course the patient was transferred to inpatient rehabilitation on 01/16/2016.  Her left lower extremity was noted to be warm with increasing edema and erythema and therefore an ultrasound of the lower extremities venous was done on 01/18/2016 which showed "Bilateral lower extremities are positive for acute deep vein thrombosis involving the right common femoral, right femoral, right profunda femoral, right popliteal, right peroneal, right posterior tibial, right gastrocnemius, left saphenofemoral junction, left femoral, left popliteal, left posterior tibial, left peroneal, and left gastrocnemius veins. There is no evidence of Baker's cyst bilaterally.  We were consulted regarding recommendations about anticoagulation. I had a preliminary discussion with Dr. Posey Pronto and appears that the patient's hemoglobin has been relatively stable.   When patient was seen she notes LLE>>RLE swelling and notes that lower abdominal fullness and discomfort and feels her  abdominal symptoms are predominantly from  constipation.   MEDICAL HISTORY:  Past Medical History  Diagnosis Date  . Acute pulmonary embolism ([place]) 12/28/2015  . Cardiac arrest ([facility]) 12/2015  . Fracture of femoral neck, left ([place]) 01/2016  . Acute respiratory failure with hypoxia ([place]) 01/2016  . Arterial hemorrhage 01/2016  . Atrial mass 01/2016  . Female pelvic hematoma 01/2016  . Hypernatremia 01/2016  . Right atrial thrombus ([place]) 01/2016    SURGICAL HISTORY: Past Surgical History  Procedure Laterality Date  . Back surgery    . 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 Ivc filter placement ([facility] hx)      SOCIAL HISTORY: Social History   Social History  . Marital Status: Married    Spouse Name: N/A  . Number of Children: N/A  . Years of Education: N/A   Occupational History  . Retired Estate manager/land agent    Social History Main Topics  . Smoking status: Former Research scientist (life sciences)  . Smokeless tobacco: Former Systems developer    Quit date: 12/11/1995  . Alcohol Use: No  . Drug Use: No  . Sexual Activity: Not on file   Other Topics Concern  . Not on file   Social History Narrative   Pt lives with her husband, walks daily.    FAMILY HISTORY: Family History  Problem Relation Age of Onset  . Hodgkin's lymphoma Mother   . Stroke Father   . Coronary artery disease Father     in his late 25s    ALLERGIES:  is allergic to hydrocodone.  MEDICATIONS:  Current Facility-Administered Medications  Medication Dose Route Frequency Provider Last Rate Last Dose  . acetaminophen (TYLENOL) tablet 650 mg  650 mg Oral TID WC & HS Bary Leriche, PA-C   650 mg at 01/18/16 2102  . alum & mag hydroxide-simeth (MAALOX/MYLANTA) 200-200-20 MG/5ML suspension 30 mL  30 mL Oral Q4H PRN Bary Leriche, PA-C      . antiseptic oral rinse (CPC / CETYLPYRIDINIUM CHLORIDE  0.05%) solution 7 mL  7 mL Mouth Rinse q12n4p Pamela S Love, PA-C   7 mL at 01/17/16 1600  . bisacodyl (DULCOLAX) suppository 10 mg  10 mg Rectal Daily PRN Bary Leriche, PA-C      . diltiazem (CARDIZEM) tablet 30 mg  30 mg Oral TID PC & HS Meredith Staggers, MD   30 mg at 01/18/16 2102  . diphenhydrAMINE (BENADRYL) 12.5 MG/5ML elixir 12.5-25 mg  12.5-25 mg Oral Q6H PRN Ivan Anchors Love, PA-C      . feeding supplement (ENSURE ENLIVE) (ENSURE ENLIVE) liquid 237 mL  237 mL Oral TID BM Bary Leriche, PA-C   237 mL at 01/18/16 2103  . guaiFENesin-dextromethorphan (ROBITUSSIN DM) 100-10 MG/5ML syrup 5-10 mL  5-10 mL Oral Q6H PRN Bary Leriche, PA-C      . methocarbamol (ROBAXIN) tablet 500 mg  500 mg Oral Q6H PRN Bary Leriche, PA-C      . metoprolol tartrate (LOPRESSOR) tablet 25 mg  25 mg Oral BID Bary Leriche, PA-C   25 mg at 01/18/16 2043  . pantoprazole sodium (PROTONIX) 40 mg/20 mL oral suspension 40 mg  40 mg Oral Daily Meredith Staggers, MD   40 mg at 01/18/16 0743  . polyethylene glycol (MIRALAX / GLYCOLAX) packet 17 g  17 g Oral Daily Ankit Lorie Phenix, MD  17 g at 01/18/16 0743  . prochlorperazine (COMPAZINE) tablet 5-10 mg  5-10 mg Oral Q6H PRN Bary Leriche, PA-C       Or  . prochlorperazine (COMPAZINE) injection 5-10 mg  5-10 mg Intramuscular Q6H PRN Bary Leriche, PA-C       Or  . prochlorperazine (COMPAZINE) suppository 12.5 mg  12.5 mg Rectal Q6H PRN Bary Leriche, PA-C      . senna-docusate (Senokot-S) tablet 1 tablet  1 tablet Oral BID Ankit Lorie Phenix, MD   1 tablet at 01/18/16 2043  . sodium phosphate (FLEET) 7-19 GM/118ML enema 1 enema  1 enema Rectal Once PRN Bary Leriche, PA-C      . traMADol Veatrice Bourbon) tablet 50 mg  50 mg Oral Q6H PRN Bary Leriche, PA-C   50 mg at 01/18/16 2043  . traZODone (DESYREL) tablet 25-50 mg  25-50 mg Oral QHS PRN Bary Leriche, PA-C   50 mg at 01/18/16 2044    REVIEW OF SYSTEMS:    10 Point review of Systems was done is negative except as noted  above.  PHYSICAL EXAMINATION: ECOG PERFORMANCE STATUS: 2 - Symptomatic, <50% confined to bed  . Filed Vitals:   01/18/16 1756 01/19/16 0519  BP: 116/61 102/54  Pulse:  101  Temp:  98.9 F (37.2 C)  Resp:  18   Filed Weights   01/17/16 0416 01/18/16 0414 01/19/16 0500  Weight: 143 lb 4.8 oz (65 kg) 141 lb 8.6 oz (64.2 kg) 142 lb 13.7 oz (64.8 kg)   .Body mass index is 22.37 kg/(m^2).  GENERAL:alert, in no acute distress Resting in bed SKIN: No acute rashes EYES:  conjunctiva are pink and non-injected, sclera clear OROPHARYNX:no exudates, mucous membranes moist NECK: supple, no JVD LYMPH:  no palpable lymphadenopathy in the cervical, axillary LUNGS: clear to auscultation with normal respiratory effort HEART: regular rate & rhythm,  no murmurs and no lower extremity edema ABDOMEN: abdomen soft, non-tender, normoactive bowel sounds  Musculoskeletal: Bilateral lower extremity swelling PSYCH: alert & oriented x 3s NEURO: no focal motor/sensory deficits  LABORATORY DATA:  I have reviewed the data as listed  . CBC Latest Ref Rng 01/17/2016 01/15/2016 01/14/2016  WBC 4.0 - 10.5 K/uL 8.7 9.5 9.6  Hemoglobin 12.0 - 15.0 g/dL 9.8(L) 9.3(L) 9.6(L)  Hematocrit 36.0 - 46.0 % 30.0(L) 29.7(L) 30.3(L)  Platelets 150 - 400 K/uL 273 283 297    . CMP Latest Ref Rng 01/17/2016 01/15/2016 01/14/2016  Glucose 65 - 99 mg/dL 126(H) 114(H) 114(H)  BUN 6 - 20 mg/dL 11 15 15   Creatinine 0.44 - 1.00 mg/dL 0.48 0.42(L) 0.49  Sodium 135 - 145 mmol/L 136 138 138  Potassium 3.5 - 5.1 mmol/L 3.6 3.5 3.7  Chloride 101 - 111 mmol/L 104 108 108  CO2 22 - 32 mmol/L 27 24 23   Calcium 8.9 - 10.3 mg/dL 9.0 8.7(L) 8.9  Total Protein 6.5 - 8.1 g/dL 5.6(L) - 4.9(L)  Total Bilirubin 0.3 - 1.2 mg/dL 0.9 - 0.9  Alkaline Phos 38 - 126 U/L 156(H) - 80  AST 15 - 41 U/L 85(H) - 55(H)  ALT 14 - 54 U/L 102(H) - 69(H)     RADIOGRAPHIC STUDIES: I have personally reviewed the radiological images as listed and agreed with  the findings in the report.  US venous lower extremities 01/18/2016: Bilateral lower extremities are positive for acute deep vein thrombosis involving the right common femoral, right femoral, right profunda femoral, right popliteal, right peroneal,  right posterior tibial, right gastrocnemius, left saphenofemoral junction, left femoral, left popliteal, left posterior tibial, left peroneal, and left gastrocnemius veins. There is no evidence of Baker's cyst bilaterally.  ASSESSMENT & PLAN:   75 year old female with recent very complicated hospitalization after fall and left hip fracture with complications as summarized above limiting GI bleed, bilateral PE, right atrial thrombus status post lytics, status post PEA arrest, status post left eye iliac artery bleeding with pelvic hematoma requiring stent graft. Patient is currently in rehabilitation and has developed  #1 bilateral extensive venous lower extremity thrombosis in the setting of relative immobilization, recent surgery and complicated hospitalization. Patient also has a pelvic hematoma due to left iliac artery bleeding that was previously concerning for venous compression.  #2 history of recent bilaterally pulmonary emboli and right atrial thrombus thought to be from extension from her proximal lower extremity DVT. Patient received thrombolytics due to right ventricle of strain. Patient had an IVC filter placed and was not thought to be a good candidate for anticoagulation for her PE and atrial fibrillation due to her issues with pelvic hematoma and GI bleeding.  Patient's hemoglobin has remained stable for several days at this time and she is undergoing rehabilitation. Recent ultrasound of her lower extremities done because of increasing lower extremity swelling shows extensive bilateral lower extremity.  PLAN -Patient would be at high risk of rebleeding from her pelvic hematoma or with recurrent GI bleed which was thought to be from unclear  etiology. -The patient is also at high risk for clot progression and though she has an IVC filter symptomatic lower extremity DVTs would continue to be a bother without anticoagulation. -Given the risk of bleeding was thought to be mitigated as per GI and orthopedic input the safest plan for anticoagulation at this time would be to start the patient on IV heparin without any bolus doses and gradually adjust upto the therapeutic range and monitor closely for bleeding.  - The patient tolerates IV heparin for 5-7days might be able to transition Lovenox if her renal function is stable. - could consider repeat CT of the abdomen to reevaluate the pelvic hematoma to ensure that this is not compressing the patient's IVC/pelvic veins and serving as an additional risk factor for worsening lower extremity venous thrombosis. -no clear indication for hypercoagulability workup in the setting of evidently provoked VTE events. -would avoid SCD's but patient could resume ambulation as tolerated once started on anticoagulation.  Plz call if any new questions/concerns arise.  All of the patients questions were answered with apparent satisfaction. The patient knows to call the clinic with any problems, questions or concerns.  I spent 70 minutes counseling the patient face to face. The total time spent in the appointment was 80 minutes and more than 50% was on counseling and direct patient cares.    Sullivan Lone MD [place] [facility] Hematology/Oncology Physician [facility]  (Office):       [phone] (Work cell):  [phone] (Fax):           [phone]  01/19/2016 8:45 AM

## 2016-01-19 NOTE — Progress Notes (Signed)
Occupational Therapy Note  Patient Details  Name: Bonnie Salazar MRN: [id number] Date of Birth: 1940-09-29  Today's Date: 01/19/2016 OT Missed Time: 57 Minutes Missed Time Reason: Patient on bedrest  Pt remains on bed rest at this time, per MD. Missing 60 min of skilled OT treatment. Will follow up once medically appropriate. Cont POC.     Lewis, Zosia Lucchese C 01/19/2016, 8:50 AM

## 2016-01-19 NOTE — Progress Notes (Signed)
Physical Therapy Note  Patient Details  Name: Bonnie Salazar MRN: [id number] Date of Birth: 1941-06-19 Today's Date: 01/19/2016    Pt continues to be on bedrest per MD at this time due to multiple DVTs. Continue PT POC and will resume when orders are changed.    [name], PT, DPT  01/19/2016, 10:51 AM

## 2016-01-19 NOTE — Evaluation (Signed)
Speech Language Pathology Assessment and Plan  Patient Details  Name: Bonnie Salazar MRN: [id number] Date of Birth: 1940/10/22  SLP Diagnosis: Speech and Language deficits;Cognitive Impairments  Rehab Potential: Good ELOS: 14-21 days     Today's Date: 01/19/2016 SLP Individual Time: 1300-1400 SLP Individual Time Calculation (min): 60 min   Problem List:  Patient Active Problem List   Diagnosis Date Noted  . Slow transit constipation   . Debility 01/16/2016  . Pain aggravated by sitting   . Abnormality of gait   . S/p left hip fracture   . Pulmonary embolism without acute cor pulmonale ([place])   . Post-operative pain   . Depression   . Bleeding gastrointestinal   . PAF (paroxysmal atrial fibrillation) ([place])   . Acute blood loss anemia   . Dysphagia   . Chronic low back pain   . Female pelvic hematoma   . Hemorrhage   . Blood per rectum   . Pulmonary embolus, left ([place])   . Pulmonary embolus, right ([place])   . Acute respiratory failure with hypoxia ([place])   . PEA (Pulseless electrical activity) ([place])   . Right atrial thrombus ([place])   . Arterial hemorrhage   . Hypovolemic shock ([place])   . Hypokalemia   . Hypernatremia   . Gastrointestinal hemorrhage with melena   . Acute pulmonary embolus ([place])   . Atrial mass   . Bleeding   . Acute pulmonary embolism ([place]) 12/28/2015  . Atrial thrombus ([place])   . Acute respiratory failure ([place])   . Hemorrhagic shock   . Cardiac arrest ([place])   . Fracture of femoral neck, left ([place]) 12/26/2015  . Hip fracture ([place]) 12/25/2015  . Leukocytosis 12/25/2015  . Dehydration 12/25/2015   Past Medical History:  Past Medical History  Diagnosis Date  . Acute pulmonary embolism ([place]) 12/28/2015  . Cardiac arrest ([place]) 12/2015  . Fracture of femoral neck, left ([place]) 01/2016  . Acute respiratory failure with hypoxia ([place]) 01/2016  . Arterial hemorrhage 01/2016  . Atrial mass 01/2016  . Female pelvic hematoma 01/2016  . Hypernatremia 01/2016  .  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 Back surgery    . 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 Ivc filter placement ([facility] hx)      Assessment / Plan / Recommendation Clinical Impression Bonnie Salazar is a 75 y.o. female admitted on 12/25/15 after a fall with subsequent left femoral neck fracture. She underwent left anterior hip arthroplasty by Dr. Fredonia [place]. Post op course complicated by abdominal distension with hypotension due to bleeding from distal iliac artery. She underwent distal external iliac artery stent down to inguinal ligament. She was treated with 11u PRBC, 7 units FFP, 2 units PLT pheresis and cryoprecipitate for life threatening bleed. Dr. Hulen Skains consulted and recommended monitoring. She tolerated extubation on 06/17 and ortho recommended WBAT with ASA for DVT prophylaxis. BLE dopplers 6/17 were negative for DVT. 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 She developed respiratory distress with marked tachycardia on 06/18 requiring face mask. CTA chest done showing bilateral PE with completely occlusive thrombus in LLL PA with nearly occlusive segmental pulmonary arteries and right heart  strain. Dr. Cyndia Bent consulted and following for input. She underwent catheter lysis of intra-atrial clot by Dr. Earleen Newport. Repeat echo 06/19 revealed that RA thrombus no longer present.   Post procedure developed leg, back and abdominal pain with hypotension requiring neo and was transfused with 2 units PRBC. She was sedated and intubated for airway protection. She developed PEA cardiac arrest later that evening requiring 10 minutes of resuscitation. CTA abdomen/pelvis showed  decrease in extraperitoneal bleed, compression of left external iliac and comon iliac veins by pelvic hematoma, no new pelvic hematoma and enlarging hematoma proximal left thigh. CT head without acute changes. Heparin was discontinued and IVC filter placed on 06/21. Has been treated with antibiotics for fevers as well as IV diuresis for fluid overload. She tolerated extubation on 06/23 and continues to be NPO due to high aspiration risk. MBS done yesterday revealing oropharyngeal dysphagia with delay in swallow, moderate residue, fatigue and aspiration. She developed bloody stools on 06/26 and GI consulted for input. Dr. Silverio Decamp questioned ischemic colitis v/s hemorrhoids as cause of bleeding and supportive care recommended. She has had intermittent rectal bleeding felt to be due to hems. H/H has been relatively stable and leucocytosis has resolved. Repeat swallow evaluation done on 7/03 and she was started on dysphagia 1, nectar liquids. Tube feeds discontinued with resolution of hypernatremia and diarrhea. Patient continue to have DOE but mobility improving and patient remains motivated. CIR recommended for follow up therapy and patient cleared medically for intensive rehab program.   Patient was admitted to [facility] and demonstrates mild, high-level cognitive impairments characterized by difficulty with retrieval of new, complex information as well as difficulty retrieving words and solving complex problems, which impact the patient's overall safety and independence with self-care and home management tasks. Patient also demonstrates fatigue and poor frustration tolerance with challenging tasks. Patient would benefit from skilled SLP intervention in order to maximize her functional independence prior to discharge. Follow up recommendations to be determine based upon progress.       Skilled Therapeutic Interventions          Cognitive-linguistic evaluation completed.  Patient  score 12/22 on the [facility] Cognitive Assessment, MoCA-BLIND, version 7.1.  Results and recommendations reviewed with the patient.     SLP Assessment  Patient will need skilled [place] [facility] during CIR admission    Recommendations  Patient destination: Home Follow up Recommendations: Other (comment) (TBD) Equipment Recommended: None recommended by SLP    SLP Frequency 3 to 5 out of 7 days   SLP Duration  SLP Intensity  SLP Treatment/Interventions 14-21 days   Minumum of 1-2 x/day, 30 to 90 minutes  Cueing hierarchy;Environmental controls;Functional tasks;Internal/external aids;Patient/family education;Cognitive remediation/compensation;Medication managment;Speech/Language facilitation    Pain Pain Assessment Pain Score: 7 in her stomach, RN aware  Prior Functioning Cognitive/Linguistic Baseline: Within functional limits  Function:  Eating Eating   Modified Consistency Diet: Yes Eating Assist Level: Supervision or verbal cues           Cognition Comprehension Comprehension assist level: Follows basic conversation/direction with no assist  Expression   Expression assist level: Expresses basic needs/ideas: With extra time/assistive device  Social Interaction Social Interaction assist level: Interacts appropriately 90% of the time - Needs monitoring or encouragement for participation or interaction.  Problem Solving Problem solving assist level: Solves basic problems with no assist  Memory Memory assist level: Recognizes or recalls 50 - 74% of the time/requires cueing 25 - 49% of the time   Short Term  Goals: Week 1: SLP Short Term Goal 1 (Week 1): Pt will consume dys 3 textures and thin liquids with mod I use of swallowing precautions and minimal overt s/s of aspiration or fatigue over 3 consecutive sessions.  SLP Short Term Goal 2 (Week 1): Pt will consume trials of regular textures with mod I use of swallowing precautions and no overt s/s of  aspiration or fatigue over 3 consecutive sessions prior to advancement.   SLP Short Term Goal 3 (Week 1): Pt will utilize external aids to recall new, semi-complex information Mod I SLP Short Term Goal 4 (Week 1): Pt will utilize word finding strategies during verbal expression of complex ideas Mod I  SLP Short Term Goal 5 (Week 1): Pt will solve complex problem realted to home management tasks with Supervision level verbal cues   Refer to Care Plan for Long Term Goals  Recommendations for other services: None  Discharge Criteria: Patient will be discharged from SLP if patient refuses treatment 3 consecutive times without medical reason, if treatment goals not met, if there is a change in medical status, if patient makes no progress towards goals or if patient is discharged from hospital.  The above assessment, treatment plan, treatment alternatives and goals were discussed and mutually agreed upon: by patient  Gunnar Fusi, M.A., CCC-SLP [phone]  [place] 01/19/2016, 4:59 PM

## 2016-01-19 NOTE — Progress Notes (Signed)
Speech Language Pathology Daily Session Note  Patient Details  Name: Bonnie Salazar MRN: [id number] Date of Birth: Jan 16, 1941  Today's Date: 01/19/2016 SLP Individual Time: [id number] SLP Individual Time Calculation (min): 30 min  Short Term Goals: Week 1: SLP Short Term Goal 1 (Week 1): Pt will consume dys 3 textures and thin liquids with mod I use of swallowing precautions and minimal overt s/s of aspiration or fatigue over 3 consecutive sessions.  SLP Short Term Goal 2 (Week 1): Pt will consume trials of regular textures with mod I use of swallowing precautions and no overt s/s of aspiration or fatigue over 3 consecutive sessions prior to advancement.    Skilled Therapeutic Interventions: Skilled treatment session focused on dysphagia goals. SLP facilitated session by providing Min A verbal cues for patient to utilize a small rate of self-feeding during a snack of Dys. 3 textures with thin liquids. Patient consumed minimal amounts of snack without overt s/s of aspiration and was Mod I for use of alternating solids/liquids, small bites/sips and an intermittent cough/throat clear. Patient's husband present during end of session and educated on a variety of foods the patient can eat on her currently prescribed diet due to her "picky" eating habits. Patient left upright in bed with husband present. Continue with current plan of care.    Function:  Eating Eating   Modified Consistency Diet: Yes Eating Assist Level: Supervision or verbal cues           Cognition Comprehension Comprehension assist level: Follows basic conversation/direction with no assist  Expression   Expression assist level: Expresses basic needs/ideas: With no assist  Social Interaction Social Interaction assist level: Interacts appropriately 90% of the time - Needs monitoring or encouragement for participation or interaction.  Problem Solving Problem solving assist level: Solves basic problems with no assist   Memory Memory assist level: More than reasonable amount of time    Pain Pain Assessment Pain Score: 7  PAINAD (Pain Assessment in Advanced Dementia) Breathing: normal  Therapy/Group: Individual Therapy  Christop Hippert 01/19/2016, 2:52 PM

## 2016-01-19 NOTE — Progress Notes (Addendum)
Patient has been having smears each time she is cleaned up.  Spoke with Algis Liming, PA who recommended giving an enema based on KUB results this past weekend.  Patients husband spoke up and said "well she had 2 large bowel movements yesterday after the Xray was taken."  I explained that she is continuing to have smears each time which may mean she hasn't emptied.  Patient and husband seem hesitant to have enema.  Order for urine culture obtained due to foul smelling urine and urinary incontinence.  Explained to patient we needed to obtain sample and if she couldn't provide one, we may need to catheterize her to get one.  She was adamant that she would not be catheterized.  Patient and spouse also concerned that her cardizem is making her "foggy."  Husband states she has been this way since the medication was started.  Educated patient and spouse that sometimes urinary tract infections can cause some mental status changes.  Algis Liming, PA aware of this concern as well and has consulted cardiology to come and follow up with patient.  EKG obtained.  Awaiting new orders.  Brita Romp, RN

## 2016-01-20 ENCOUNTER — Inpatient Hospital Stay (HOSPITAL_COMMUNITY): Payer: Medicare HMO | Admitting: Speech Pathology

## 2016-01-20 ENCOUNTER — Inpatient Hospital Stay (HOSPITAL_COMMUNITY): Payer: Medicare HMO

## 2016-01-20 ENCOUNTER — Inpatient Hospital Stay (HOSPITAL_COMMUNITY): Payer: Medicare HMO | Admitting: Occupational Therapy

## 2016-01-20 DIAGNOSIS — R600 Localized edema: Secondary | ICD-10-CM

## 2016-01-20 DIAGNOSIS — N390 Urinary tract infection, site not specified: Secondary | ICD-10-CM | POA: Insufficient documentation

## 2016-01-20 DIAGNOSIS — R609 Edema, unspecified: Secondary | ICD-10-CM | POA: Insufficient documentation

## 2016-01-20 LAB — ECHOCARDIOGRAM COMPLETE
CHL CUP DOP CALC LVOT VTI: 16.4 cm
E decel time: 173 msec
EERAT: 5.98
FS: 15 % — AB (ref 28–44)
HEIGHTINCHES: 67 in
IV/PV OW: 1.09
LA ID, A-P, ES: 22 mm
LA diam end sys: 22 mm
LA vol A4C: 17.8 ml
LADIAMINDEX: 1.28 cm/m2
LV E/e' medial: 5.98
LV e' LATERAL: 12.5 cm/s
LVEEAVG: 5.98
LVOT area: 3.46 cm2
LVOT diameter: 21 mm
LVOTPV: 111 cm/s
LVOTSV: 57 mL
MV Dec: 173
MV pk A vel: 72.8 m/s
MVPG: 2 mmHg
MVPKEVEL: 74.7 m/s
PW: 7.63 mm — AB (ref 0.6–1.1)
TDI e' lateral: 12.5
TDI e' medial: 8.49
Weight: 2198.4 oz

## 2016-01-20 LAB — URINE CULTURE

## 2016-01-20 LAB — HEPARIN LEVEL (UNFRACTIONATED): Heparin Unfractionated: <0.1 IU/mL — ABNORMAL LOW (ref 0.30–0.70)

## 2016-01-20 MED ORDER — HEPARIN (PORCINE) IN NACL 100-0.45 UNIT/ML-% IJ SOLN
1150.0000 [IU]/h | INTRAMUSCULAR | Status: DC
  Administered 2016-01-20: 500 [IU]/h via INTRAVENOUS
  Administered 2016-01-21: 950 [IU]/h via INTRAVENOUS
  Administered 2016-01-22 – 2016-01-24 (×3): 1150 [IU]/h via INTRAVENOUS
  Filled 2016-01-20 (×8): qty 250

## 2016-01-20 MED ORDER — HYDROCORTISONE ACE-PRAMOXINE 1-1 % RE FOAM
1.0000 | Freq: Two times a day (BID) | RECTAL | Status: DC
  Administered 2016-01-20 – 2016-02-04 (×24): 1 via RECTAL
  Filled 2016-01-20 (×3): qty 10

## 2016-01-20 MED ORDER — METOPROLOL TARTRATE 25 MG PO TABS
25.0000 mg | ORAL_TABLET | Freq: Three times a day (TID) | ORAL | Status: DC
  Administered 2016-01-20 – 2016-02-01 (×34): 25 mg via ORAL
  Filled 2016-01-20 (×37): qty 1

## 2016-01-20 MED ORDER — GERHARDT'S BUTT CREAM
TOPICAL_CREAM | Freq: Two times a day (BID) | CUTANEOUS | Status: DC
  Administered 2016-01-20 – 2016-01-21 (×3): via TOPICAL
  Administered 2016-01-22: 1 via TOPICAL
  Administered 2016-01-22: 09:00:00 via TOPICAL
  Administered 2016-01-23: 1 via TOPICAL
  Administered 2016-01-23 – 2016-01-27 (×8): via TOPICAL
  Administered 2016-01-27: 1 via TOPICAL
  Administered 2016-01-28 – 2016-01-31 (×7): via TOPICAL
  Administered 2016-01-31: 1 via TOPICAL
  Administered 2016-02-01: 08:00:00 via TOPICAL
  Administered 2016-02-01: 1 via TOPICAL
  Administered 2016-02-02 – 2016-02-03 (×3): via TOPICAL
  Administered 2016-02-03: 1 via TOPICAL
  Filled 2016-01-20 (×3): qty 1

## 2016-01-20 NOTE — Progress Notes (Signed)
Speech Language Pathology Daily Session Note  Patient Details  Name: Bonnie Salazar MRN: [id number] Date of Birth: 1940/12/01  Today's Date: 01/20/2016 SLP Individual Time: 1300-1330 SLP Individual Time Calculation (min): 30 min  Short Term Goals: Week 1: SLP Short Term Goal 1 (Week 1): Pt will consume dys 3 textures and thin liquids with mod I use of swallowing precautions and minimal overt s/s of aspiration or fatigue over 3 consecutive sessions.  SLP Short Term Goal 2 (Week 1): Pt will consume trials of regular textures with mod I use of swallowing precautions and no overt s/s of aspiration or fatigue over 3 consecutive sessions prior to advancement.   SLP Short Term Goal 3 (Week 1): Pt will utilize external aids to recall new, semi-complex information Mod I SLP Short Term Goal 4 (Week 1): Pt will utilize word finding strategies during verbal expression of complex ideas Mod I  SLP Short Term Goal 5 (Week 1): Pt will solve complex problem realted to home management tasks with Supervision level verbal cues   Skilled Therapeutic Interventions: Patient appeared brighter this session, up in chair.  Skilled treatment session focused on addressing continued diagnostic treatment of reading and writing abilities.  Patient independent with writing familiar and novel information.  Reading comprehension at the paragraph level was impacted by recall deficits but when prompted to utilize look back as a compensatory strategy patient able to locate needed information.  Additional tasks such a trail making and clock drawing resulted in the need for increased time as well as Min verbal cues to problem solve accurate completion.  Current goals remain appropriate.  Continue with current plan of care.    Function:  Cognition Comprehension Comprehension assist level: Follows basic conversation/direction with no assist  Expression   Expression assist level: Expresses basic needs/ideas: With no assist  Social  Interaction Social Interaction assist level: Interacts appropriately 90% of the time - Needs monitoring or encouragement for participation or interaction.  Problem Solving Problem solving assist level: Solves basic 90% of the time/requires cueing < 10% of the time  Memory Memory assist level: Recognizes or recalls 90% of the time/requires cueing < 10% of the time    Pain Pain Assessment Pain Assessment: No/denies pain Pain Score: 7   Therapy/Group: Individual Therapy  Carmelia Roller., CCC-SLP [id number]  [place] 01/20/2016, 4:05 PM

## 2016-01-20 NOTE — Progress Notes (Signed)
ANTICOAGULATION CONSULT NOTE - Follow Up Consult  Pharmacy Consult for heparin Indication: New DVTs  Allergies  Allergen Reactions  . Hydrocodone     Confusion/sedation    Patient Measurements: Height: 5\' 7"  (170.2 cm) Weight: 137 lb 6.4 oz (62.324 kg) IBW/kg (Calculated) : 61.6 Heparin Dosing Weight: 62 kg  Vital Signs: Temp: 99.3 F (37.4 C) (07/11 1431) Temp Source: Oral (07/11 1431) BP: 111/64 mmHg (07/11 1451) Pulse Rate: 84 (07/11 1451)  Labs:  Recent Labs  01/20/16 1559  HEPARINUNFRC <0.10*    Estimated Creatinine Clearance: 60 mL/min (by C-G formula based on Cr of 0.48).   Assessment: 75 y.o. female with new DVTs for heparin. MD requests no bolus and slow titration due to h/o GI bleed and pelvic hematoma. Previously therapeutic on heparin at 750 units/hr during inpatient admission 12/2015.  First HL undetectable. RN reports no issues.  Goal of Therapy:  Heparin level 0.3-0.7 units/ml Monitor platelets by anticoagulation protocol: Yes   Plan:  Increase heparin gtt 700 units/hr Check 8 hr HL Monitor daily HL, CBC, s/s of bleed   Elenor Quinones, PharmD, [facility] Clinical Pharmacist Pager [phone] 01/20/2016 5:57 PM

## 2016-01-20 NOTE — Progress Notes (Signed)
Occupational Therapy Session Note  Patient Details  Name: Bonnie Salazar MRN: [id number] Date of Birth: [birth year]  Today's Date: 01/20/2016 OT Individual Time: [id number] OT Individual Time Calculation (min): 55 min    Short Term Goals: Week 1:  OT Short Term Goal 1 (Week 1): Pt will engage in 5 minutes of functional task before needing rest break secondary to fatigue.  OT Short Term Goal 2 (Week 1): Pt will perform toilet transfer with mod A in order to increase I with functional transfers.  OT Short Term Goal 3 (Week 1): Pt will perform UB dressing with set up A in order to increase I with self care.  OT Short Term Goal 4 (Week 1): Pt will perform LB dressing with max A in order to decrease level of assistance with self care.  Skilled Therapeutic Interventions/Progress Updates:    Upon entering the room, pt supine in bed with RN attempting to change pt. Pt rolling L <> R with max A and assist for hygiene and to don brief. Pt performed supine >sit to EOB with min verbal cues for technique and hand placement. Pt needing max A for assist with B LE's and trunk. Pt unable to stand from bed with RW likely due to anxiousness and being fearful. OT performed stand pivot transfer with max A from bed > drop arm commode chair. Pt requiring total A with toileting for clothing management and hygiene. Pt able to void urine and large BM this session with 20 minutes on commode. Pt having increased stomach pain this session as well. OT providing education on importance of sitting up, being mobile, and transferring onto commode in order to empty body vs. Bed pan. Pt verbalized understanding. Pt then transferred into wheelchair from [facility] with max A stand pivot transfer. Her husband is present in room. Call bell and all needed items within reach upon exiting the room.   Therapy Documentation Precautions:  Precautions Precautions: Fall Restrictions Weight Bearing Restrictions: Yes LLE Weight Bearing: Weight  bearing as tolerated General:   Vital Signs: Therapy Vitals Pulse Rate: (!) 102 BP: 136/60 mmHg Pain: Pain Assessment Pain Score: 7   See Function Navigator for Current Functional Status.   Therapy/Group: Individual Therapy  Phineas Semen 01/20/2016, 11:43 AM

## 2016-01-20 NOTE — Progress Notes (Signed)
Physical Therapy Session Note  Patient Details  Name: Bonnie Salazar MRN: [id number] Date of Birth: [birth year]  Today's Date: 01/20/2016 PT Individual Time: 1100-1130 PT Individual Time Calculation (min): 30 min   Short Term Goals: Week 1:  PT Short Term Goal 1 (Week 1): Pt will perform bed mobility with mod A and 50% cues for initiation and sequencing PT Short Term Goal 2 (Week 1): Pt will perform bed <> chair transfers stand pivot with mod A and 50% cues to initiate and sequence PT Short Term Goal 3 (Week 1): Pt will perform gait x 50' with RW and mod A  PT Short Term Goal 4 (Week 1): Pt will negotiate 4 stairs with 2 rails for LE strengthening and mod A PT Short Term Goal 5 (Week 1): Pt will perform w/c mobility x 100' with bilat UE for UE strengthening and endurance  Skilled Therapeutic Interventions/Progress Updates:    Pt cleared for therapies today. With much encouragement due to anxiety and fatigue, focused on functional transfers including bed -> w/c and then w/c > toilet, w/c mobility in the room, and hygiene tasks at the sink from w/c level. Pt required extra time and several attempts for transfers with overall max assist for sit <> stand and min assist for stand step part of transfer with cues for technique, hand placement, and upright posture. At sink, required set-up and verbal cues for sequencing of functional tasks. Pt reported needing to have BM and had started to have it in her brief before transfer to [facility] could be completed. RN present and handoff to further assist patient with toileting needs.   Therapy Documentation Precautions:  Precautions Precautions: Fall Restrictions Weight Bearing Restrictions: Yes LLE Weight Bearing: Weight bearing as tolerated  Pain: Premedicated for L hip pain.     See Function Navigator for Current Functional Status.   Therapy/Group: Individual Therapy  [name], PT, DPT  01/20/2016, 12:13 PM

## 2016-01-20 NOTE — Progress Notes (Signed)
ANTICOAGULATION CONSULT NOTE - Initial Consult  Pharmacy Consult for Hepairn Indication: h/o RA thrombus/PE and Afib, now new DVTs  Allergies  Allergen Reactions  . Hydrocodone     Confusion/sedation    Patient Measurements: Height: 5\' 7"  (170.2 cm) Weight: 137 lb 6.4 oz (62.324 kg) IBW/kg (Calculated) : 61.6  Vital Signs: Temp: 98.3 F (36.8 C) (07/11 0529) Temp Source: Oral (07/11 0529) BP: 120/64 mmHg (07/11 0529) Pulse Rate: 101 (07/11 0529)  Labs: No results for input(s): HGB, HCT, PLT, APTT, LABPROT, INR, HEPARINUNFRC, HEPRLOWMOCWT, CREATININE, CKTOTAL, CKMB, TROPONINI in the last 72 hours.  Estimated Creatinine Clearance: 60 mL/min (by C-G formula based on Cr of 0.48).   Medical History: Past Medical History  Diagnosis Date  . Acute pulmonary embolism ([place]) 12/28/2015  . Cardiac arrest ([place]) 12/2015  . Fracture of femoral neck, left ([place]) 01/2016  . Acute respiratory failure with hypoxia ([place]) 01/2016  . Arterial hemorrhage 01/2016  . Atrial mass 01/2016  . Female pelvic hematoma 01/2016  . Hypernatremia 01/2016  . 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 acetaminophen  650 mg Oral TID WC & HS  . cephALEXin  250 mg Oral Q8H  . feeding supplement (ENSURE ENLIVE)  237 mL Oral TID BM  . metoprolol tartrate  25 mg Oral TID  . pantoprazole sodium  40 mg Oral Daily  . polyethylene glycol  17 g Oral Daily  . senna-docusate  1 tablet Oral BID    Assessment: 75 y.o. female with new DVTs for heparin.  MD requests no bolus and slow titration due to h/o GI bleed and pelvic hematoma.  Previously therapeutic on heparin at 750 units/hr during inpatient admission 12/2015.  Goal of Therapy:  Heparin level 0.3-0.7 units/ml Monitor platelets by anticoagulation protocol: Yes   Plan:  Start heparin 500 units/hr Check heparin level in 8 hours.   Caryl Pina 01/20/2016,7:23 AM

## 2016-01-20 NOTE — Progress Notes (Signed)
[facility] PHYSICAL MEDICINE & REHABILITATION     PROGRESS NOTE  Subjective/Complaints:  Pt shivering in bed.  She states she is very cold sensitive and this happens to her every time she drinks iced tea, but she loves iced tea.  Pt seen by consulting physicians yesterday.  She is awaiting her heparin, so that she may resume therapies.    ROS: Denies CP, SOB, N/V/D.  Objective: Vital Signs: Blood pressure 120/64, pulse 101, temperature 98.3 F (36.8 C), temperature source Oral, resp. rate 18, height 5\' 7"  (1.702 m), weight 62.324 kg (137 lb 6.4 oz), SpO2 95 %. No results found. No results for input(s): WBC, HGB, HCT, PLT in the last 72 hours. No results for input(s): NA, K, CL, GLUCOSE, BUN, CREATININE, CALCIUM in the last 72 hours.  Invalid input(s): CO CBG (last 3)   Recent Labs  01/17/16 1125 01/19/16 1120 01/19/16 1637  GLUCAP 102* 144* 125*    Wt Readings from Last 3 Encounters:  01/20/16 62.324 kg (137 lb 6.4 oz)  01/16/16 63.4 kg (139 lb 12.4 oz)    Physical Exam:  BP 120/64 mmHg  Pulse 101  Temp(Src) 98.3 F (36.8 C) (Oral)  Resp 18  Ht 5\' 7"  (1.702 m)  Wt 62.324 kg (137 lb 6.4 oz)  BMI 21.51 kg/m2  SpO2 95% Constitutional: She appears well-developed and well-nourished. NAD.  HENT: Normocephalic and atraumatic.  Eyes: Conjunctivae and EOM are normal.  Cardiovascular: Normal rate and regular rhythm.  Respiratory: Effort normal and breath sounds normal. No stridor. No respiratory distress. She has no wheezes.  GI: +Firm lower abdomen. She exhibits distension. There is no tenderness.  Musculoskeletal: She exhibits edema and tenderness.  Edema LLE.  Neurological: She is alert and oriented.  Able to follow commands without difficulty.  Motor: B/l UE: 4+/5 proximal to distal RLE: Hip flexion 2+/5, knee extension 3/5, ankle dorsi/plantar flexion 5/5 LLE: Hip flexion 2+/5, knee extension 3/5, ankle dorsi/plantar flexion 5/5 Skin: Skin is warm and dry.  Left  anterior hip incision healing with steri-strips in place.  Psychiatric: She is slowed. Cognition and memory are normal.   Assessment/Plan: 1. Functional deficits secondary to debility after left hip fracture complicated by arterial bleed, hemorrhagic shock, bilateral PE, PEA, Atrial thrombus, GIB. medical issues. which require 3+ hours per day of interdisciplinary therapy in a comprehensive inpatient rehab setting. Physiatrist is providing close team supervision and 24 hour management of active medical problems listed below. Physiatrist and rehab team continue to assess barriers to discharge/monitor patient progress toward functional and medical goals.  Function:  Bathing Bathing position   Position: Wheelchair/chair at sink  Bathing parts Body parts bathed by patient: Right arm, Left arm, Chest, Abdomen Body parts bathed by helper: Buttocks, Back  Bathing assist Assist Level: Touching or steadying assistance(Pt > 75%)      Upper Body Dressing/Undressing Upper body dressing Upper body dressing/undressing activity did not occur: Refused What is the patient wearing?: Hospital gown                Upper body assist Assist Level: Set up   Set up : To obtain clothing/put away  Lower Body Dressing/Undressing Lower body dressing   What is the patient wearing?: Non-skid slipper socks, Ted Hose           Non-skid slipper socks- Performed by helper: Don/doff right sock, Don/doff left sock               TED Hose - Performed by  helper: Don/doff right TED hose, Don/doff left TED hose  Lower body assist Assist for lower body dressing:  (max a)      Toileting Toileting Toileting activity did not occur: No continent bowel/bladder event   Toileting steps completed by helper: Adjust clothing prior to toileting, Performs perineal hygiene, Adjust clothing after toileting Toileting Assistive Devices: Grab bar or rail  Toileting assist Assist level: More than reasonable time, Two  helpers   Transfers Chair/bed transfer Chair/bed transfer activity did not occur: Refused Chair/bed transfer method: Stand pivot Chair/bed transfer assist level: Moderate assist (Pt 50 - 74%/lift or lower) Chair/bed transfer assistive device: Medical sales representative Ambulation activity did not occur: Refused   Max distance: 10 Assist level: Touching or steadying assistance (Pt > 75%)   Wheelchair   Type: Manual Max wheelchair distance: 75 ft Assist Level: Touching or steadying assistance (Pt > 75%)  Cognition Comprehension Comprehension assist level: Follows basic conversation/direction with no assist  Expression Expression assist level: Expresses basic needs/ideas: With no assist  Social Interaction Social Interaction assist level: Interacts appropriately 90% of the time - Needs monitoring or encouragement for participation or interaction.  Problem Solving Problem solving assist level: Solves complex 90% of the time/cues < 10% of the time  Memory Memory assist level: Recognizes or recalls 90% of the time/requires cueing < 10% of the time    Medical Problem List and Plan: 1. Abnormality of gait, weakness, low endurance secondary to debility after left hip fracture complicated by arterial bleed, hemorrhagic shock, bilateral PE, PEA, Atrial thrombus, GIB. medical issues.  -Resume CIR after initiation of IV Heparin 2. Bilateral PE/DVT Prophylaxis/Anticoagulation:   SCDs d/ced due to clots  IVC filter in place.   Hx of GIB.   LE U/S showing extensive b/l DVTs, per Heme will start IV Heparin without bolus and consider transition to Lovenox in ~5 days if no bleeding. Appreciate recs. 3. Pain Management: Will schedule tylenol. Tramadol added for prn use.  4. Mood: Showing activation and humor. LCSW to follow for evaluation and support.  5. Neuropsych: This patient is capable of making decisions on her own behalf. 6. Skin/Wound Care: Routine pressure relief measures.  Monitor incision daily for healing.  7. Fluids/Electrolytes/Nutrition: Monitor I/O.   BMP within acceptable range on 7/8  Advanced to dysphagia 3, thin diet, will cont to advance as tolerated 8. RA thrombus: Resolved post thrombolysis  Repeat Echo ordered per Cardiology 9. PAF: In NSR on Cardizem, cardiology consulted and diltiazem d/ced.  Will titrate metoprolol as needed.    Monitor HR bid.  10 GIB:H/H stable. Continue PPI.   Will continue to monitor for recurrent melena or hematochezia.  Confounding bladder scans.  KUB on 7/7 reviewed, showing moderate stool  Will order abd CT tomorrow to evaluate compression of veins and monitor bleeding 11. ABLA:   Has been treated with multiple units PRBC post arterial hemorrhage/GIB.   Hb 9.8 on 7/8  Serial CBCs ordered 12. Dysphagia: Tolerating diet advancement. Po intake improving 13. Chronic low back pain: Lumbar films reviewed, 7/7 stable.  Added kpad for local measures 14. Constipation  Increased bowel reg again on 7/9  Suppository ordered on 7/9 15. UTI  UA with bacteria  Ucx pending  Empiric keflex started on 7/10  LOS (Days) 4 A FACE TO FACE EVALUATION WAS PERFORMED  Mandell Pangborn Lorie Phenix 01/20/2016 8:22 AM

## 2016-01-20 NOTE — Progress Notes (Signed)
Speech Language Pathology Daily Session Note  Patient Details  Name: MARYETTA CSASZAR MRN: [id number] Date of Birth: [birth year]  Today's Date: 01/20/2016 SLP Individual Time: 1430-1500 SLP Individual Time Calculation (min): 30 min  Short Term Goals: Week 1: SLP Short Term Goal 1 (Week 1): Pt will consume dys 3 textures and thin liquids with mod I use of swallowing precautions and minimal overt s/s of aspiration or fatigue over 3 consecutive sessions.  SLP Short Term Goal 2 (Week 1): Pt will consume trials of regular textures with mod I use of swallowing precautions and no overt s/s of aspiration or fatigue over 3 consecutive sessions prior to advancement.   SLP Short Term Goal 3 (Week 1): Pt will utilize external aids to recall new, semi-complex information Mod I SLP Short Term Goal 4 (Week 1): Pt will utilize word finding strategies during verbal expression of complex ideas Mod I  SLP Short Term Goal 5 (Week 1): Pt will solve complex problem realted to home management tasks with Supervision level verbal cues   Skilled Therapeutic Interventions: Skilled treatment session focused on cognitive goals. SLP facilitated session by providing supervision question cues for recall of events from previous therapy sessions. Patient also participated in a basic money management task and completed with extra time and supervision question cues. Patient left upright in recliner with all needs within reach. Continue with current plan of care.    Function:  Cognition Comprehension Comprehension assist level: Follows basic conversation/direction with no assist  Expression   Expression assist level: Expresses basic needs/ideas: With no assist  Social Interaction Social Interaction assist level: Interacts appropriately 90% of the time - Needs monitoring or encouragement for participation or interaction.  Problem Solving Problem solving assist level: Solves basic 90% of the time/requires cueing < 10% of the  time  Memory Memory assist level: Recognizes or recalls 90% of the time/requires cueing < 10% of the time    Pain Pain Assessment Pain Score: 7   Therapy/Group: Individual Therapy  Latonya Nelon 01/20/2016, 3:19 PM

## 2016-01-20 NOTE — Progress Notes (Signed)
Subjective:  Patient well known to me after prior hospitalization detailed in chart with acute PE, RA thrombus and DVT following pelvic bleeding.  Had thrombolysis done but unable to anticoagulate due to recurrent bleeding and filter placed in IVC.  She also had a fib but is back in sinus rhythm now.  Had AI and MR also noted when prior ECHO done. SHe has had an amazing recovery and is now on rehab.  I was asked to see about d/c of diltiazem as she and husband feel it is associated with lethargy and side effects. Has acute DVT again and seen by hematology.   Objective:  Vital Signs in the last 24 hours: BP 118/57 mmHg  Pulse 97  Temp(Src) 98.9 F (37.2 C) (Oral)  Resp 18  Ht 5\' 7"  (1.702 m)  Wt 64.8 kg (142 lb 13.7 oz)  BMI 22.37 kg/m2  SpO2 96%  Physical Exam: Pleasant WF in NAD Lungs:  Clear  Cardiac:  Regular rhythm, normal S1 and S2, no S3, faint systolic murmur Abdomen:  Soft, nontender, no masses Extremities:  1 + edema present  Intake/Output from previous day: 07/10 0701 - 07/11 0700 In: 817 [P.O.:817] Out: -  Weight Filed Weights   01/17/16 0416 01/18/16 0414 01/19/16 0500  Weight: 65 kg (143 lb 4.8 oz) 64.2 kg (141 lb 8.6 oz) 64.8 kg (142 lb 13.7 oz)    Lab Results: Basic Metabolic Panel:  Recent Labs  01/17/16 0421  NA 136  K 3.6  CL 104  CO2 27  GLUCOSE 126*  BUN 11  CREATININE 0.48    CBC:  Recent Labs  01/17/16 0421  WBC 8.7  NEUTROABS 7.2  HGB 9.8*  HCT 30.0*  MCV 96.2  PLT 273   Assessment/Plan:  1. Paroxsymal atrial fib occurring in the setting of complicated post op course following hip surgery.  Now in NSR 2. Prior bilat DVT and right heart strain in setting of large RA thrombus.  3. DVT currently not good candidate for anticoag orally but plans for possible heparin  Rec:  OK to d/c diltiazem and increase beta blocker for now.  I would like to get another ECHO to assess the regurgitation and chamber sizes now that things are  settling down.      Kerry Hough  MD [facility] Card[facility]  01/20/2016, 12:37 AM

## 2016-01-20 NOTE — Progress Notes (Signed)
Echocardiogram 2D Echocardiogram has been performed.  Tresa Res 01/20/2016, 10:55 AM

## 2016-01-20 NOTE — Progress Notes (Signed)
Physical Therapy Session Note  Patient Details  Name: Bonnie Salazar MRN: [id number] Date of Birth: 1940/11/26  Today's Date: 01/20/2016 PT Individual Time: 1335-1415 PT Individual Time Calculation (min): 40 min   Short Term Goals: Week 1:  PT Short Term Goal 1 (Week 1): Pt will perform bed mobility with mod A and 50% cues for initiation and sequencing PT Short Term Goal 2 (Week 1): Pt will perform bed <> chair transfers stand pivot with mod A and 50% cues to initiate and sequence PT Short Term Goal 3 (Week 1): Pt will perform gait x 50' with RW and mod A  PT Short Term Goal 4 (Week 1): Pt will negotiate 4 stairs with 2 rails for LE strengthening and mod A PT Short Term Goal 5 (Week 1): Pt will perform w/c mobility x 100' with bilat UE for UE strengthening and endurance  Skilled Therapeutic Interventions/Progress Updates:   Session focused on functional toilet transfers with RW due to need for urine sample RN with max assist for sit <> stand and mod verbal cues for sequencing and hand placement, short distance gait from w/c to recliner with RW with min assist and verbal cues for positioning of RW and upright posture, and education with pt and husband on positioning for edema control in BLE (especially since unable to use compression hose at this time due to +DVTs). Upon PT arrival, pt's LE's were not on legrests and in dependent position with noted increased edema. Recommended use of recliner for rest breaks for improved elevation of BLE and PT placed pillows with education on ankle pumps. Pt appears down during session, verbalizing that she was "never like this before" but discouraged that she will regain what she has lost from this illness. Emotional support and encouragement provided trying to engage pt in discussion about things she likes to do. Pt would benefit from Neuropsych evaluation for further assessment and coping.   Therapy Documentation Precautions:  Precautions Precautions:  Fall Restrictions Weight Bearing Restrictions: Yes LLE Weight Bearing: Weight bearing as tolerated  Pain: Reports pain in LLE and increased edema noted (pt had legs in dependent position also). Premedicated.    See Function Navigator for Current Functional Status.   Therapy/Group: Individual Therapy  [name], PT, DPT  01/20/2016, 3:38 PM

## 2016-01-21 ENCOUNTER — Inpatient Hospital Stay (HOSPITAL_COMMUNITY): Payer: Medicare HMO | Admitting: Speech Pathology

## 2016-01-21 ENCOUNTER — Inpatient Hospital Stay (HOSPITAL_COMMUNITY): Payer: Medicare HMO | Admitting: Occupational Therapy

## 2016-01-21 ENCOUNTER — Encounter (HOSPITAL_COMMUNITY): Payer: Self-pay | Admitting: Radiology

## 2016-01-21 ENCOUNTER — Inpatient Hospital Stay (HOSPITAL_COMMUNITY): Payer: Medicare HMO | Admitting: Physical Therapy

## 2016-01-21 ENCOUNTER — Inpatient Hospital Stay (HOSPITAL_COMMUNITY): Payer: Medicare HMO

## 2016-01-21 DIAGNOSIS — K661 Hemoperitoneum: Secondary | ICD-10-CM | POA: Insufficient documentation

## 2016-01-21 LAB — COMPREHENSIVE METABOLIC PANEL
ALT: 159 U/L — ABNORMAL HIGH (ref 14–54)
ANION GAP: 10 (ref 5–15)
AST: 109 U/L — AB (ref 15–41)
Albumin: 2 g/dL — ABNORMAL LOW (ref 3.5–5.0)
Alkaline Phosphatase: 261 U/L — ABNORMAL HIGH (ref 38–126)
BUN: 8 mg/dL (ref 6–20)
CO2: 26 mmol/L (ref 22–32)
Calcium: 8.3 mg/dL — ABNORMAL LOW (ref 8.9–10.3)
Chloride: 99 mmol/L — ABNORMAL LOW (ref 101–111)
Creatinine, Ser: 0.62 mg/dL (ref 0.44–1.00)
GFR calc Af Amer: >60 mL/min (ref >60)
GFR calc non Af Amer: >60 mL/min (ref >60)
GLUCOSE: 207 mg/dL — AB (ref 65–99)
POTASSIUM: 2.5 mmol/L — AB (ref 3.5–5.1)
Sodium: 135 mmol/L (ref 135–145)
Total Bilirubin: 0.5 mg/dL (ref 0.3–1.2)
Total Protein: 5.3 g/dL — ABNORMAL LOW (ref 6.5–8.1)

## 2016-01-21 LAB — BASIC METABOLIC PANEL
ANION GAP: 8 (ref 5–15)
BUN: 8 mg/dL (ref 6–20)
CHLORIDE: 99 mmol/L — AB (ref 101–111)
CO2: 27 mmol/L (ref 22–32)
Calcium: 8.5 mg/dL — ABNORMAL LOW (ref 8.9–10.3)
Creatinine, Ser: 0.54 mg/dL (ref 0.44–1.00)
GFR calc non Af Amer: >60 mL/min (ref >60)
Glucose, Bld: 151 mg/dL — ABNORMAL HIGH (ref 65–99)
Potassium: 2.9 mmol/L — ABNORMAL LOW (ref 3.5–5.1)
Sodium: 134 mmol/L — ABNORMAL LOW (ref 135–145)

## 2016-01-21 LAB — CBC
HEMATOCRIT: 30.9 % — AB (ref 36.0–46.0)
Hemoglobin: 9.5 g/dL — ABNORMAL LOW (ref 12.0–15.0)
MCH: 29.5 pg (ref 26.0–34.0)
MCHC: 30.7 g/dL (ref 30.0–36.0)
MCV: 96 fL (ref 78.0–100.0)
PLATELETS: 260 10*3/uL (ref 150–400)
RBC: 3.22 MIL/uL — ABNORMAL LOW (ref 3.87–5.11)
RDW: 16.3 % — AB (ref 11.5–15.5)
WBC: 6.7 10*3/uL (ref 4.0–10.5)

## 2016-01-21 LAB — URINE CULTURE

## 2016-01-21 LAB — HEPARIN LEVEL (UNFRACTIONATED)
Heparin Unfractionated: 0.12 IU/mL — ABNORMAL LOW (ref 0.30–0.70)
Heparin Unfractionated: 0.2 IU/mL — ABNORMAL LOW (ref 0.30–0.70)
Heparin Unfractionated: 0.22 IU/mL — ABNORMAL LOW (ref 0.30–0.70)

## 2016-01-21 MED ORDER — POTASSIUM CHLORIDE 20 MEQ/15ML (10%) PO SOLN
20.0000 meq | Freq: Once | ORAL | Status: AC
  Administered 2016-01-21: 20 meq via ORAL
  Filled 2016-01-21: qty 15

## 2016-01-21 MED ORDER — IOPAMIDOL (ISOVUE-300) INJECTION 61%
INTRAVENOUS | Status: AC
  Administered 2016-01-21: 100 mL
  Filled 2016-01-21: qty 100

## 2016-01-21 MED ORDER — POTASSIUM CHLORIDE 10 MEQ/100ML IV SOLN
10.0000 meq | INTRAVENOUS | Status: AC
  Administered 2016-01-21 (×2): 10 meq via INTRAVENOUS
  Filled 2016-01-21 (×5): qty 100

## 2016-01-21 MED ORDER — POTASSIUM CHLORIDE 20 MEQ/15ML (10%) PO SOLN
40.0000 meq | Freq: Two times a day (BID) | ORAL | Status: DC
Start: 2016-01-21 — End: 2016-01-23
  Administered 2016-01-21 – 2016-01-22 (×3): 40 meq via ORAL
  Filled 2016-01-21 (×4): qty 30

## 2016-01-21 MED ORDER — POTASSIUM CHLORIDE 20 MEQ/15ML (10%) PO SOLN: 40.0000 meq | Freq: Two times a day (BID) | ORAL | Status: DC

## 2016-01-21 NOTE — Progress Notes (Signed)
Subjective:  Echo yesterday showed improvement in her right ventricular function.  Still with moderate posterior directed mitral regurgitation with some mitral valve prolapse.  Aortic regurgitation has improved.  LV function is normal.  No real shortness of breath.  Objective:  Vital Signs in the last 24 hours: BP 127/54 mmHg  Pulse 91  Temp(Src) 99.4 F (37.4 C) (Oral)  Resp 16  Ht 5\' 7"  (1.702 m)  Wt 61.1 kg (134 lb 11.2 oz)  BMI 21.09 kg/m2  SpO2 94%  Physical Exam: Thin pleasant female in no acute distress Lungs:  Clear  Cardiac:  Regular rhythm, normal S1 and S2, no S3 Extremities:  1+ edema present  Intake/Output from previous day: 07/11 0701 - 07/12 0700 In: 720 [P.O.:720] Out: -  Weight Filed Weights   01/19/16 0500 01/20/16 0529 01/21/16 0447  Weight: 64.8 kg (142 lb 13.7 oz) 62.324 kg (137 lb 6.4 oz) 61.1 kg (134 lb 11.2 oz)   Assessment/Plan:  1.  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  Atrial thrombus is now resolved 3.  Mild to moderate mitral regurgitation 4.  History of paroxysmal atrial fibrillation in sinus rhythm  Recommendations:  Currently off of diltiazem and on beta blockers alone.  Continue IV heparin for now.Kerry Hough  MD [facility] Cardiology  01/21/2016, 9:05 AM

## 2016-01-21 NOTE — Progress Notes (Signed)
Speech Language Pathology Daily Session Note  Patient Details  Name: Bonnie Salazar MRN: [id number] Date of Birth: 08/27/1940  Today's Date: 01/21/2016 SLP Individual Time: 1130-1200 SLP Individual Time Calculation (min): 30 min  Short Term Goals: Week 1: SLP Short Term Goal 1 (Week 1): Pt will consume dys 3 textures and thin liquids with mod I use of swallowing precautions and minimal overt s/s of aspiration or fatigue over 3 consecutive sessions.  SLP Short Term Goal 2 (Week 1): Pt will consume trials of regular textures with mod I use of swallowing precautions and no overt s/s of aspiration or fatigue over 3 consecutive sessions prior to advancement.   SLP Short Term Goal 3 (Week 1): Pt will utilize external aids to recall new, semi-complex information Mod I SLP Short Term Goal 4 (Week 1): Pt will utilize word finding strategies during verbal expression of complex ideas Mod I  SLP Short Term Goal 5 (Week 1): Pt will solve complex problem realted to home management tasks with Supervision level verbal cues   Skilled Therapeutic Interventions:  Pt was seen for skilled ST targeting dysphagia goals.  Therapist facilitated the session with trials of regular textures to continue working towards diet progression.  Pt consumed advanced consistencies with mod I use of swallowing precautions.  Pt required increased time for mastication of regular textures but demonstrated improved endurance in comparison to initial evaluation. No overt s/s of aspiration with solids or liquids.  Pt requested that orders be modified to allow for meds to be administered whole.  Recommend that large pills be administered whole with puree but smaller meds may be given with liquids. Continue per current plan of care.     Function:  Eating Eating   Modified Consistency Diet: Yes Eating Assist Level: More than reasonable amount of time           Cognition Comprehension Comprehension assist level: Follows basic  conversation/direction with extra time/assistive device  Expression   Expression assist level: Expresses basic needs/ideas: With extra time/assistive device  Social Interaction Social Interaction assist level: Interacts appropriately 90% of the time - Needs monitoring or encouragement for participation or interaction.  Problem Solving Problem solving assist level: Solves basic 75 - 89% of the time/requires cueing 10 - 24% of the time  Memory Memory assist level: Recognizes or recalls 75 - 89% of the time/requires cueing 10 - 24% of the time    Pain Pain Assessment Pain Assessment: No/denies pain  Therapy/Group: Individual Therapy  Genessa Beman, Selinda Orion 01/21/2016, 12:09 PM

## 2016-01-21 NOTE — Progress Notes (Signed)
ANTICOAGULATION CONSULT NOTE - Follow Up Consult  Pharmacy Consult for heparin Indication: New DVTs  Allergies  Allergen Reactions  . Hydrocodone     Confusion/sedation    Patient Measurements: Height: 5\' 7"  (170.2 cm) Weight: 134 lb 11.2 oz (61.1 kg) IBW/kg (Calculated) : 61.6 Heparin Dosing Weight: 62 kg  Vital Signs: Temp: 98.2 F (36.8 C) (07/12 1427) Temp Source: Oral (07/12 1427) BP: 120/69 mmHg (07/12 1427) Pulse Rate: 95 (07/12 1427)  Labs:  Recent Labs  01/21/16 0018 01/21/16 0817 01/21/16 1301 01/21/16 1839  HGB  --  9.5*  --   --   HCT  --  30.9*  --   --   PLT  --  260  --   --   HEPARINUNFRC 0.12* 0.20*  --  0.22*  CREATININE  --  0.62 0.54  --     Estimated Creatinine Clearance: 59.5 mL/min (by C-G formula based on Cr of 0.54).   Assessment: 75 y.o. female with new DVTs on IV heparin. MD requests no bolus and slow titration due to h/o GI bleed and pelvic hematoma.heparin level 0.2, remains subtherapeutic on 850 units/hr. Hgb 9.5, low stable, pltc 260K. No bleeding noted per chart.   Repeat HL remains subtherapeutic at 0.22 on heparin 950 units/hr. Nurse reports no issues with infusion or bleeding.   Goal of Therapy:  Heparin level 0.3-0.7 units/ml Monitor platelets by anticoagulation protocol: Yes   Plan:  Increase heparin to 1150 units/hr 8h HL Monitor daily HL, CBC, s/s of bleed   Andrey Cota. Diona Foley, PharmD, [place] Clinical Pharmacist Pager [phone] 01/21/2016 7:17 PM

## 2016-01-21 NOTE — Progress Notes (Signed)
Speech Language Pathology Daily Session Note  Patient Details  Name: Bonnie Salazar MRN: [id number] Date of Birth: 02-12-1941  Today's Date: 01/21/2016 SLP Individual Time: 1447-1530 SLP Individual Time Calculation (min): 43 min  Short Term Goals: Week 1: SLP Short Term Goal 1 (Week 1): Pt will consume dys 3 textures and thin liquids with mod I use of swallowing precautions and minimal overt s/s of aspiration or fatigue over 3 consecutive sessions.  SLP Short Term Goal 2 (Week 1): Pt will consume trials of regular textures with mod I use of swallowing precautions and no overt s/s of aspiration or fatigue over 3 consecutive sessions prior to advancement.   SLP Short Term Goal 3 (Week 1): Pt will utilize external aids to recall new, semi-complex information Mod I SLP Short Term Goal 4 (Week 1): Pt will utilize word finding strategies during verbal expression of complex ideas Mod I  SLP Short Term Goal 5 (Week 1): Pt will solve complex problem realted to home management tasks with Supervision level verbal cues   Skilled Therapeutic Interventions: Skilled treatment session focused on cognitive goals. SLP facilitated session by providing extra time and supervision verbal cues for problem solving with a basic money management task. Patient demonstrated decreased recall during task and required total A for utilization of visual aids. Patient became tearful throughout session due to feeling"overwhelmed by everything." SLP provided emotional support. Patient left upright in recliner with husband present and all needs within reach. Continue with current plan of care.    Function:   Cognition Comprehension Comprehension assist level: Follows basic conversation/direction with extra time/assistive device  Expression   Expression assist level: Expresses basic needs/ideas: With extra time/assistive device  Social Interaction Social Interaction assist level: Interacts appropriately 90% of the time -  Needs monitoring or encouragement for participation or interaction.  Problem Solving Problem solving assist level: Solves basic 75 - 89% of the time/requires cueing 10 - 24% of the time  Memory Memory assist level: Recognizes or recalls 75 - 89% of the time/requires cueing 10 - 24% of the time    Pain Pain Assessment Pain Assessment: 0-10 Pain Score: 3  Pain Type: Acute pain Pain Location: Back Pain Orientation: Lower Pain Descriptors / Indicators: Aching Pain Onset: On-going Patients Stated Pain Goal: 3 Pain Intervention(s): Medication (See eMAR)  Therapy/Group: Individual Therapy  Airel Magadan 01/21/2016, 4:19 PM

## 2016-01-21 NOTE — Progress Notes (Signed)
[place] for Hepairn Indication: h/o RA thrombus/PE and Afib, now new DVTs  Allergies  Allergen Reactions  . Hydrocodone     Confusion/sedation    Patient Measurements: Height: 5\' 7"  (170.2 cm) Weight: 137 lb 6.4 oz (62.324 kg) IBW/kg (Calculated) : 61.6  Vital Signs: Temp: 99.3 F (37.4 C) (07/11 1431) Temp Source: Oral (07/11 1431) BP: 112/54 mmHg (07/11 2020) Pulse Rate: 84 (07/11 1451)  Labs:  Recent Labs  01/20/16 1559 01/21/16 0018  HEPARINUNFRC <0.10* 0.12*    Estimated Creatinine Clearance: 60 mL/min (by C-G formula based on Cr of 0.48).  Assessment: 75 y.o. female with new DVTs for heparin.  Goal of Therapy:  Heparin level 0.3-0.7 units/ml Monitor platelets by anticoagulation protocol: Yes   Plan:  Increase Heparin 850 units/hr Check heparin level in 8 hours.  Caryl Pina 01/21/2016,1:03 AM

## 2016-01-21 NOTE — Patient Care Conference (Signed)
Inpatient RehabilitationTeam Conference and Plan of Care Update Date: 01/20/2016   Time: 11:30 AM    Patient Name: Bonnie Salazar      Medical Record Number: [id number]  Date of Birth: 10-11-1940 Sex: Female         Room/Bed: 4M01C/4M01C-01 Payor Info: Payor: [employer] MEDICARE / Plan: [employer] MEDICARE HMO/PPO / Product Type: *No Product type* /    Admitting Diagnosis: Lf Hip Fx  Admit Date/Time:  01/16/2016  2:30 PM Admission Comments: No comment available   Primary Diagnosis:  <principal problem not specified> Principal Problem: <principal problem not specified>  Patient Active Problem List   Diagnosis Date Noted  . Peritoneal bleeding   . Edema   . Acute lower UTI   . Slow transit constipation   . Debility 01/16/2016  . Pain aggravated by sitting   . Abnormality of gait   . S/p left hip fracture   . Pulmonary embolism without acute cor pulmonale ([place])   . Post-operative pain   . Depression   . Bleeding gastrointestinal   . PAF (paroxysmal atrial fibrillation) ([place])   . Acute blood loss anemia   . Dysphagia   . Chronic low back pain   . Female pelvic hematoma   . Hemorrhage   . Blood per rectum   . Pulmonary embolus, left ([place])   . Pulmonary embolus, right ([place])   . Acute respiratory failure with hypoxia ([place])   . PEA (Pulseless electrical activity) ([place])   . Right atrial thrombus ([place])   . Arterial hemorrhage   . Hypovolemic shock ([place])   . Hypokalemia   . Hypernatremia   . Gastrointestinal hemorrhage with melena   . Acute pulmonary embolus ([place])   . Atrial mass   . Bleeding   . Acute pulmonary embolism ([place]) 12/28/2015  . Atrial thrombus ([place])   . Acute respiratory failure ([place])   . Hemorrhagic shock   . Cardiac arrest ([place])   . Fracture of femoral neck, left ([place]) 12/26/2015  . Hip fracture ([place]) 12/25/2015  . Leukocytosis 12/25/2015  . Dehydration 12/25/2015    Expected Discharge Date: Expected Discharge Date: 02/04/16  Team Members Present: Physician  leading conference: Dr. Delice Lesch Social Worker Present: Lennart Pall, LCSW Nurse Present: Heather Roberts, RN PT Present: Canary Brim, PT OT Present: Benay Pillow, OT     Current Status/Progress Goal Weekly Team Focus  Medical   Abnormality of gait, weakness, low endurance secondary to debility after left hip fracture complicated by arterial bleed, hemorrhagic shock, bilateral PE, PEA, Atrial thrombus, GIB. medical issues  Multiple medical issues, endurance  See above   Bowel/Bladder   Continent of Bowel/Bladder. LBM 7/10  Patient to reamin continent of bowel/bladder with min assist  monitor bowel/bladder and timed toilet q2hrs   Swallow/Nutrition/ Hydration   Dys. 3 textures with thin liuqids, intermittent supervision  Mod I  tolerance of current diet, use of swallow strategies, possible trials of upgraded textures   ADL's   Max A sit <> stand, LB dressing/ bathing and functional transfers; Supervision/ VCs upper; total A toileting  Supervision- min A overall  activity tolerance, ADL re-training, functional transfers, OOB tolerance, pain management   Mobility   max assist on eval but limited due to fatigue; pt has been on bedrest since 7/9 due to multiple + DVT  supervision/min assist overall  activity tolerance, strengthening, transfers, gait, family education, OOB   Biomedical engineer  Observations  Min A  Mod I  complex problem solving and recall   Pain   Complained of hip pain. Schedule Tylenol given  <3  Monitor and treat pain q shift and as needed   Skin   Left hip surgical wound with adhesive strips. redness on her bott with barrier cream  patient skin to be free of new breakdown/infection while in [facility]  Monitor skin q shift and as needed     Rehab Goals Patient on target to meet rehab goals: Yes *See Care Plan and progress notes for long and short-term goals.  Barriers to Discharge: PE, DVTs, Cardiac meds, dysphagia, GIB, ABLA, ?UTI     Possible Resolutions to Barriers:  Cards, Heme/Onc consults, follow labs, advace diet as tolerated, echo, CT abd    Discharge Planning/Teaching Needs:  Plan to d/c home with husband able to provide 24/7 assistance.  Teaching to be scheduled closer to d/c   Team Discussion:  Multiple medical issues.  Multiple DVTs and now off bedrest.  Cardiology and HemOnc consulting.  Started heparin this am and will monitor closely.  Currently a total assist for toileting and able to stand ~ 4 mins.  Pt HIGHLY anxious.  Very picky with food.  Goals set for supervison/ min assist overall with short distance ambulation goals.  Revisions to Treatment Plan:  None   Continued Need for Acute Rehabilitation Level of Care: The patient requires daily medical management by a physician with specialized training in physical medicine and rehabilitation for the following conditions: Daily direction of a multidisciplinary physical rehabilitation program to ensure safe treatment while eliciting the highest outcome that is of practical value to the patient.: Yes Daily medical management of patient stability for increased activity during participation in an intensive rehabilitation regime.: Yes Daily analysis of laboratory values and/or radiology reports with any subsequent need for medication adjustment of medical intervention for : Cardiac problems;Pulmonary problems;Post surgical problems  Bonnie Salazar 01/21/2016, 3:43 PM

## 2016-01-21 NOTE — Progress Notes (Signed)
Social Work Patient ID: Bonnie Salazar, female   DOB: 09-12-1940, 75 y.o.   MRN: [id number]   Met with pt and spouse yesterday following team conference. Both aware a targeted d/c date set for 7/26 and pt is hopeful that will be moved up considerably. Discussed consults that were to be done and that medical picture is still being considered which can affect d/c date. Agreeable with overall targeted goals of supervision/ min assist.  Continue to follow.  Jamarian Jacinto, LCSW

## 2016-01-21 NOTE — Progress Notes (Signed)
Occupational Therapy Session Note  Patient Details  Name: Bonnie Salazar MRN: [id number] Date of Birth: [birth year]  Today's Date: 01/21/2016 OT Individual Time: 1030-1130 OT Individual Time Calculation (min): 60 min    Short Term Goals: Week 1:  OT Short Term Goal 1 (Week 1): Pt will engage in 5 minutes of functional task before needing rest break secondary to fatigue.  OT Short Term Goal 2 (Week 1): Pt will perform toilet transfer with mod A in order to increase I with functional transfers.  OT Short Term Goal 3 (Week 1): Pt will perform UB dressing with set up A in order to increase I with self care.  OT Short Term Goal 4 (Week 1): Pt will perform LB dressing with max A in order to decrease level of assistance with self care.  Skilled Therapeutic Interventions/Progress Updates:    Pt seen for OT ADL bathing/dressing session. Pt in supine upon arrival, requiring increased time and encouragement for participation in therapy session. Educated regarding importance of mobility and participation with ADLs in order to increase functional activity tolerance/ endurance and to return to her PLOF, her stated goal. Encouragement and therapeutic listening provided with pt becoming tearful at times when thinking of all she has been through since admission. She voiced increased "burning" at active IV site, RN aware and ice pack provided for comfort. With encouragement and max cuing for intiation/ technique, pt transferred to sitting EOB. She completed bathing/dressing seated EOB, requiring mod cuing for basic problem solving of clothing management. Sit <> stands at [facility] completed with max A to stand and min steadying assist while pt attempted to pull pants up, therapist ultimately assisting due to tight fitting pants.  She completed stand pivot transfer to recliner, requiring min A once in standing with VCs for technique of transfer. She brushed teeth sitting in recliner, with min cuing for sequencing.  Pt  left sitting in recliner at end of session, all needs in reach.   Throughout session, pt becoming anxious and tearful, requiring max cuing and demonstration for deep breathing techniques.   Therapy Documentation Precautions:  Precautions Precautions: Fall Restrictions Weight Bearing Restrictions: Yes LLE Weight Bearing: Weight bearing as tolerated Pain: Pain Assessment Pain Score: 4   See Function Navigator for Current Functional Status.   Therapy/Group: Individual Therapy  Lewis, Shontia Gillooly C 01/21/2016, 11:50 AM

## 2016-01-21 NOTE — Progress Notes (Signed)
Physical Therapy Session Note  Patient Details  Name: Bonnie Salazar MRN: [id number] Date of Birth: June 06, 1941  Today's Date: 01/21/2016 PT Individual Time: 1300-1400 PT Individual Time Calculation (min): 60 min   Short Term Goals: Week 1:  PT Short Term Goal 1 (Week 1): Pt will perform bed mobility with mod A and 50% cues for initiation and sequencing PT Short Term Goal 2 (Week 1): Pt will perform bed <> chair transfers stand pivot with mod A and 50% cues to initiate and sequence PT Short Term Goal 3 (Week 1): Pt will perform gait x 50' with RW and mod A  PT Short Term Goal 4 (Week 1): Pt will negotiate 4 stairs with 2 rails for LE strengthening and mod A PT Short Term Goal 5 (Week 1): Pt will perform w/c mobility x 100' with bilat UE for UE strengthening and endurance  Skilled Therapeutic Interventions/Progress Updates:    Pt presented in recliner with c/o pain at IV site 2/2 medication being administered.  Pt required encouragement to participate in therapy stating that unable to tolerate 2/2 pain.  Pt eventually agreeable to perform chair therex.  Pt participated in ankle pumps, LAQ, hip flexion, hip abd (small range), pillow squeezes 2x10 with therapeutic rests required in between due to c/o pain and fatigue.  Pt then stating urgent need to use toilet, une of BSC 2/2 increased urgency.  [facility]  Pt requiring mod cues for sequencing with transfers.  Once returned to recliner pt continued to practice sit to stand transfer from recliner chair with PTA having pt verbalize sequencing with improved results. Pt left in recliner with call bell within reach, needs met and nurse present.   Therapy Documentation Precautions:  Precautions Precautions: Fall Restrictions Weight Bearing Restrictions: Yes LLE Weight Bearing: Weight bearing as tolerated General:   Vital Signs: Therapy Vitals Temp: 98.2 F (36.8 C) Temp Source: Oral Pulse Rate: 95 Resp: 18 BP:  120/69 mmHg Patient Position (if appropriate): Sitting Oxygen Therapy SpO2: 95 % O2 Device: Not Delivered Pain: Pain Assessment Pain Assessment: 0-10 Pain Score: 3  Pain Type: Acute pain Pain Location: Back Pain Orientation: Lower Pain Descriptors / Indicators: Aching Pain Onset: On-going Patients Stated Pain Goal: 3 Pain Intervention(s): Medication (See eMAR)   See Function Navigator for Current Functional Status.   Therapy/Group: Individual Therapy  Cerena Baine  Juliano Mceachin, PTA  01/21/2016, 3:00 PM

## 2016-01-21 NOTE — Progress Notes (Signed)
ANTICOAGULATION CONSULT NOTE - Follow Up Consult  Pharmacy Consult for heparin Indication: New DVTs  Allergies  Allergen Reactions  . Hydrocodone     Confusion/sedation    Patient Measurements: Height: 5\' 7"  (170.2 cm) Weight: 134 lb 11.2 oz (61.1 kg) IBW/kg (Calculated) : 61.6 Heparin Dosing Weight: 62 kg  Vital Signs: Temp: 99.4 F (37.4 C) (07/12 0447) Temp Source: Oral (07/12 0447) BP: 127/54 mmHg (07/12 0447) Pulse Rate: 91 (07/12 0447)  Labs:  Recent Labs  01/20/16 1559 01/21/16 0018 01/21/16 0817  HGB  --   --  9.5*  HCT  --   --  30.9*  PLT  --   --  260  HEPARINUNFRC <0.10* 0.12* 0.20*  CREATININE  --   --  0.62    Estimated Creatinine Clearance: 59.5 mL/min (by C-G formula based on Cr of 0.62).   Assessment: 75 y.o. female with new DVTs on IV heparin. MD requests no bolus and slow titration due to h/o GI bleed and pelvic hematoma.heparin level 0.2, remains subtherapeutic on 850 units/hr. Hgb 9.5, low stable, pltc 260K. No bleeding noted per chart.   Goal of Therapy:  Heparin level 0.3-0.7 units/ml Monitor platelets by anticoagulation protocol: Yes   Plan:  Increase heparin gtt 950 units/hr Check 8 hr HL at 1800 Monitor daily HL, CBC, s/s of bleed   Maryanna Shape, PharmD, BCPS  Clinical Pharmacist  Pager: [phone]   01/21/2016 9:36 AM

## 2016-01-21 NOTE — Progress Notes (Signed)
[facility] PHYSICAL MEDICINE & REHABILITATION     PROGRESS NOTE  Subjective/Complaints:  Pt laying in bed this AM.  She does not report any bleeding.  She has questions about her PPI.  She states she is tired from therapies yesterday.  Overall, she is more positive.   ROS: Denies CP, SOB, N/V/D.  Objective: Vital Signs: Blood pressure 127/54, pulse 91, temperature 99.4 F (37.4 C), temperature source Oral, resp. rate 16, height 5\' 7"  (1.702 m), weight 61.1 kg (134 lb 11.2 oz), SpO2 94 %. No results found. No results for input(s): WBC, HGB, HCT, PLT in the last 72 hours. No results for input(s): NA, K, CL, GLUCOSE, BUN, CREATININE, CALCIUM in the last 72 hours.  Invalid input(s): CO CBG (last 3)   Recent Labs  01/19/16 1120 01/19/16 1637  GLUCAP 144* 125*    Wt Readings from Last 3 Encounters:  01/21/16 61.1 kg (134 lb 11.2 oz)  01/16/16 63.4 kg (139 lb 12.4 oz)    Physical Exam:  BP 127/54 mmHg  Pulse 91  Temp(Src) 99.4 F (37.4 C) (Oral)  Resp 16  Ht 5\' 7"  (1.702 m)  Wt 61.1 kg (134 lb 11.2 oz)  BMI 21.09 kg/m2  SpO2 94% Constitutional: She appears well-developed and well-nourished. NAD.  HENT: Normocephalic and atraumatic.  Eyes: Conjunctivae and EOM are normal.  Cardiovascular: Normal rate and regular rhythm.  Respiratory: Effort normal and breath sounds normal. No stridor. No respiratory distress. She has no wheezes.  GI: +Firm lower abdomen. She exhibits distension. There is no tenderness.  Musculoskeletal: She exhibits edema and tenderness.  Edema LLE.  Neurological: She is alert and oriented.  Able to follow commands without difficulty.  Motor: B/l UE: 4+/5 proximal to distal RLE: Hip flexion 2+/5, knee extension 3/5, ankle dorsi/plantar flexion 5/5 LLE: Hip flexion 2+/5, knee extension 3/5, ankle dorsi/plantar flexion 5/5 Skin: Skin is warm and dry.  Left anterior hip incision healing with steri-strips in place.  Psychiatric: She is slowed.  Cognition and memory are normal.   Assessment/Plan: 1. Functional deficits secondary to debility after left hip fracture complicated by arterial bleed, hemorrhagic shock, bilateral PE, PEA, Atrial thrombus, GIB. medical issues. which require 3+ hours per day of interdisciplinary therapy in a comprehensive inpatient rehab setting. Physiatrist is providing close team supervision and 24 hour management of active medical problems listed below. Physiatrist and rehab team continue to assess barriers to discharge/monitor patient progress toward functional and medical goals.  Function:  Bathing Bathing position   Position: Wheelchair/chair at sink  Bathing parts Body parts bathed by patient: Right arm, Left arm, Chest, Abdomen Body parts bathed by helper: Buttocks, Back  Bathing assist Assist Level: Touching or steadying assistance(Pt > 75%)      Upper Body Dressing/Undressing Upper body dressing Upper body dressing/undressing activity did not occur: Refused What is the patient wearing?: Hospital gown                Upper body assist Assist Level: Set up   Set up : To obtain clothing/put away  Lower Body Dressing/Undressing Lower body dressing   What is the patient wearing?: Non-skid slipper socks, Ted Hose           Non-skid slipper socks- Performed by helper: Don/doff right sock, Don/doff left sock               TED Hose - Performed by helper: Don/doff right TED hose, Don/doff left TED hose  Lower body assist Assist for lower  body dressing:  (max a)      Naval architect activity did not occur: No continent bowel/bladder event   Toileting steps completed by helper: Adjust clothing prior to toileting, Performs perineal hygiene, Adjust clothing after toileting Toileting Assistive Devices: Other (comment) (RW and BSC)  Toileting assist Assist level: Touching or steadying assistance (Pt.75%)   Transfers Chair/bed transfer Chair/bed transfer activity did  not occur: Refused Chair/bed transfer method: Stand pivot Chair/bed transfer assist level: Maximal assist (Pt 25 - 49%/lift and lower) Chair/bed transfer assistive device: Medical sales representative Ambulation activity did not occur: Refused   Max distance: 10 Assist level: Touching or steadying assistance (Pt > 75%)   Wheelchair   Type: Manual Max wheelchair distance: 10' (pt refused to leave room) Assist Level: Supervision or verbal cues  Cognition Comprehension Comprehension assist level: Follows basic conversation/direction with no assist  Expression Expression assist level: Expresses basic needs/ideas: With no assist  Social Interaction Social Interaction assist level: Interacts appropriately 90% of the time - Needs monitoring or encouragement for participation or interaction.  Problem Solving Problem solving assist level: Solves complex 90% of the time/cues < 10% of the time  Memory Memory assist level: Recognizes or recalls 90% of the time/requires cueing < 10% of the time    Medical Problem List and Plan: 1. Abnormality of gait, weakness, low endurance secondary to debility after left hip fracture complicated by arterial bleed, hemorrhagic shock, bilateral PE, PEA, Atrial thrombus, GIB. medical issues.  -Cont CIR  2. Bilateral PE/DVT Prophylaxis/Anticoagulation:   SCDs d/ced due to clots  IVC filter in place.   Hx of GIB.   LE U/S showing extensive b/l DVTs, per Heme will start IV Heparin without bolus and consider transition to Lovenox in ~5 days (7/16) if no bleeding. Appreciate recs. 3. Pain Management: Will schedule tylenol. Tramadol added for prn use.  4. Mood: Showing activation and humor. LCSW to follow for evaluation and support.  5. Neuropsych: This patient is capable of making decisions on her own behalf. 6. Skin/Wound Care: Routine pressure relief measures. Monitor incision daily for healing.  7. Fluids/Electrolytes/Nutrition: Monitor I/O.   BMP  within acceptable range on 7/8  Advanced to dysphagia 3, thin diet, will cont to advance as tolerated 8. RA thrombus: Resolved post thrombolysis  Repeat Echo ordered per Cardiology, reviewed, no thrombus, slightly improved 9. PAF: In NSR on Cardizem, cardiology consulted and diltiazem d/ced.    Will titrate metoprolol as needed, stable at present.    Monitor HR bid.  10 GIB:H/H stable. Continue PPI.   Will continue to monitor for recurrent melena or hematochezia.  Confounding bladder scans.  KUB on 7/7 reviewed, showing moderate stool  Will order abd CT tomorrow to evaluate compression of veins and monitor bleeding 11. ABLA:   Has been treated with multiple units PRBC post arterial hemorrhage/GIB.   Hb 9.8 on 7/8  Serial CBCs ordered, pending today 12. Dysphagia: Tolerating diet advancement. Po intake improving 13. Chronic low back pain: Lumbar films reviewed, 7/7 stable.  Added kpad for local measures 14. Constipation: Resolved  Increased bowel reg again on 7/9  Suppository ordered on 7/9 15. UTI  UA with bacteria  Ucx pending  Empiric keflex started on 7/10  LOS (Days) 5 A FACE TO FACE EVALUATION WAS PERFORMED  Adewale Pucillo Lorie Phenix 01/21/2016 8:04 AM

## 2016-01-22 ENCOUNTER — Inpatient Hospital Stay (HOSPITAL_COMMUNITY): Payer: Medicare HMO | Admitting: Speech Pathology

## 2016-01-22 ENCOUNTER — Inpatient Hospital Stay (HOSPITAL_COMMUNITY): Payer: Medicare HMO | Admitting: Physical Therapy

## 2016-01-22 ENCOUNTER — Inpatient Hospital Stay (HOSPITAL_COMMUNITY): Payer: Medicare HMO | Admitting: Occupational Therapy

## 2016-01-22 DIAGNOSIS — E876 Hypokalemia: Secondary | ICD-10-CM

## 2016-01-22 DIAGNOSIS — I82413 Acute embolism and thrombosis of femoral vein, bilateral: Secondary | ICD-10-CM | POA: Insufficient documentation

## 2016-01-22 LAB — HEPARIN LEVEL (UNFRACTIONATED)
HEPARIN UNFRACTIONATED: 0.56 [IU]/mL (ref 0.30–0.70)
HEPARIN UNFRACTIONATED: 0.49 [IU]/mL (ref 0.30–0.70)

## 2016-01-22 LAB — BASIC METABOLIC PANEL
ANION GAP: 7 (ref 5–15)
BUN: 5 mg/dL — ABNORMAL LOW (ref 6–20)
CALCIUM: 8.8 mg/dL — AB (ref 8.9–10.3)
CO2: 28 mmol/L (ref 22–32)
CREATININE: 0.45 mg/dL (ref 0.44–1.00)
Chloride: 101 mmol/L (ref 101–111)
GFR calc non Af Amer: >60 mL/min (ref >60)
Glucose, Bld: 118 mg/dL — ABNORMAL HIGH (ref 65–99)
Potassium: 3.2 mmol/L — ABNORMAL LOW (ref 3.5–5.1)
SODIUM: 136 mmol/L (ref 135–145)

## 2016-01-22 LAB — CBC
HEMATOCRIT: 32.5 % — AB (ref 36.0–46.0)
HEMOGLOBIN: 10.1 g/dL — AB (ref 12.0–15.0)
MCH: 29.6 pg (ref 26.0–34.0)
MCHC: 31.1 g/dL (ref 30.0–36.0)
MCV: 95.3 fL (ref 78.0–100.0)
Platelets: 263 10*3/uL (ref 150–400)
RBC: 3.41 MIL/uL — ABNORMAL LOW (ref 3.87–5.11)
RDW: 16.5 % — AB (ref 11.5–15.5)
WBC: 7.1 10*3/uL (ref 4.0–10.5)

## 2016-01-22 MED ORDER — ENSURE ENLIVE PO LIQD
237.0000 mL | Freq: Two times a day (BID) | ORAL | Status: DC
  Administered 2016-01-23 – 2016-01-31 (×14): 237 mL via ORAL

## 2016-01-22 NOTE — Progress Notes (Signed)
Speech Language Pathology Daily Session Note  Patient Details  Name: Bonnie Salazar MRN: [id number] Date of Birth: August 12, 1940  Today's Date: 01/22/2016 SLP Individual Time: 0800-0900 SLP Individual Time Calculation (min): 60 min  Short Term Goals: Week 1: SLP Short Term Goal 1 (Week 1): Pt will consume dys 3 textures and thin liquids with mod I use of swallowing precautions and minimal overt s/s of aspiration or fatigue over 3 consecutive sessions.  SLP Short Term Goal 2 (Week 1): Pt will consume trials of regular textures with mod I use of swallowing precautions and no overt s/s of aspiration or fatigue over 3 consecutive sessions prior to advancement.   SLP Short Term Goal 3 (Week 1): Pt will utilize external aids to recall new, semi-complex information Mod I SLP Short Term Goal 4 (Week 1): Pt will utilize word finding strategies during verbal expression of complex ideas Mod I  SLP Short Term Goal 5 (Week 1): Pt will solve complex problem realted to home management tasks with Supervision level verbal cues   Skilled Therapeutic Interventions: Pt  alert sitting upright in bed. Observed with breakfast. Discussed rationale for intermittent throat clear to clear airway of any penetrated material. Reviewed additional swallow precautions. Pt verbalized confusion re: timeline of hospitalization. Pt required multiple repetitions of admit date to retain information when trialed in 5 minute increments. Pt unable to communicate the major events of her hospitalization or why she is on any of her meds. Began medication education with RN present. Discussed diagnosis of A Fib and whya controlled heart reate is important. Discussed risk factors of A Fib  including stroke as well as s/s A Fib.Pt able to generally recall informaation basics, but unable to communicate specifics. Pt unable to facilitate oral transit of meds with pudding- improved with water.    Function:  Eating Eating   Modified Consistency  Diet: Yes Eating Assist Level: More than reasonable amount of time;Supervision or verbal cues           Cognition Comprehension Comprehension assist level: Understands basic 75 - 89% of the time/ requires cueing 10 - 24% of the time  Expression   Expression assist level: Expresses basic needs/ideas: With no assist  Social Interaction Social Interaction assist level: Interacts appropriately 75 - 89% of the time - Needs redirection for appropriate language or to initiate interaction.  Problem Solving Problem solving assist level: Solves basic 75 - 89% of the time/requires cueing 10 - 24% of the time  Memory Memory assist level: Recognizes or recalls 50 - 74% of the time/requires cueing 25 - 49% of the time    Pain Pain Assessment Pain Assessment: 0-10 Pain Score: 7  Pain Type: Chronic pain Pain Location: Back Pain Orientation: Lower Pain Onset: On-going Patients Stated Pain Goal: 0 Pain Intervention(s): RN made aware;Distraction;Repositioned  Therapy/Group: Individual Therapy  Vinetta Bergamo MA, CCC-SLP 01/22/2016, 10:46 AM

## 2016-01-22 NOTE — Progress Notes (Signed)
Nutrition Follow-up  DOCUMENTATION CODES:   Not applicable  INTERVENTION:  Provide Ensure Enlive po BID, each supplement provides 350 kcal and 20 grams of protein.  Encourage adequate PO intake.   NUTRITION DIAGNOSIS:   Inadequate oral intake related to dysphagia as evidenced by per patient/family report; improving  GOAL:   Patient will meet greater than or equal to 90% of their needs; met  MONITOR:   PO intake, Supplement acceptance, Diet advancement  REASON FOR ASSESSMENT:   Malnutrition Screening Tool    ASSESSMENT:   75 y.o. female admitted on 12/25/15 after a fall with subsequent left femoral neck fracture. She underwent left anterior hip arthroplasty by Dr. Fredonia Highland. Post op course complicated by abdominal distension with hypotension due to bleeding from distal iliac artery. Presents with Debility after left hip fracture complicated by arterial bleed, hemorrhagic shock, bilateral PE, PEA, Atrial thrombus, GIB.  Meal completion has been 90-100% since diet has been advanced. Intake has improved. Pt additionally has been consuming her Ensure Shakes. RD to decrease orders as intake has improved. Pt encouraged to eat her food at meals.  Diet Order:  DIET DYS 3 Room service appropriate?: Yes; Fluid consistency:: Thin  Skin:  Wound (see comment) (Wound on coccyx, incision on L hip)  Last BM:  7/12  Height:   Ht Readings from Last 1 Encounters:  01/16/16 '5\' 7"'$  (1.702 m)    Weight:   Wt Readings from Last 1 Encounters:  01/22/16 140 lb 9.6 oz (63.776 kg)    Ideal Body Weight:  61.36 kg  BMI:  Body mass index is 22.02 kg/(m^2).  Estimated Nutritional Needs:   Kcal:  1600-1800  Protein:  75-85 grams  Fluid:  1.6 - 1.8 L/day  EDUCATION NEEDS:   No education needs identified at this time  Corrin Parker, MS, RD, LDN Pager # [phone] After hours/ weekend pager # [phone]

## 2016-01-22 NOTE — Progress Notes (Signed)
ANTICOAGULATION CONSULT NOTE - Follow Up Consult  Pharmacy Consult for heparin Indication: New DVTs  Allergies  Allergen Reactions  . Hydrocodone     Confusion/sedation    Patient Measurements: Height: 5\' 7"  (170.2 cm) Weight: 134 lb 11.2 oz (61.1 kg) IBW/kg (Calculated) : 61.6 Heparin Dosing Weight: 62 kg  Vital Signs: BP: 103/50 mmHg (07/12 1952) Pulse Rate: 83 (07/12 1952)  Labs:  Recent Labs  01/21/16 0817 01/21/16 1301 01/21/16 1839 01/22/16 0415 01/22/16 0416  HGB 9.5*  --   --  10.1*  --   HCT 30.9*  --   --  32.5*  --   PLT 260  --   --  263  --   HEPARINUNFRC 0.20*  --  0.22*  --  0.56  CREATININE 0.62 0.54  --  0.45  --     Estimated Creatinine Clearance: 59.5 mL/min (by C-G formula based on Cr of 0.45).   Assessment: 75 y.o. female with new DVTs on IV heparin. MD requests no bolus and slow titration due to h/o GI bleed and pelvic hematoma.Heparin level therapeutic on 1150 units/hr. CBC stable. No bleeding noted per chart.   Goal of Therapy:  Heparin level 0.3-0.7 units/ml Monitor platelets by anticoagulation protocol: Yes   Plan:  Continue heparin at 1150 units/hr Will f/u 6h confirmatory heparin level  Sherlon Handing, PharmD, BCPS Clinical pharmacist, pager [phone] 01/22/2016 5:23 AM

## 2016-01-22 NOTE — Progress Notes (Signed)
Occupational Therapy Session Note  Patient Details  Name: Bonnie Salazar MRN: [id number] Date of Birth: April 26, 1941  Today's Date: 01/22/2016 OT Individual Time: 0900-1000 OT Individual Time Calculation (min): 60 min    Short Term Goals: Week 1:  OT Short Term Goal 1 (Week 1): Pt will engage in 5 minutes of functional task before needing rest break secondary to fatigue.  OT Short Term Goal 2 (Week 1): Pt will perform toilet transfer with mod A in order to increase I with functional transfers.  OT Short Term Goal 3 (Week 1): Pt will perform UB dressing with set up A in order to increase I with self care.  OT Short Term Goal 4 (Week 1): Pt will perform LB dressing with max A in order to decrease level of assistance with self care.  Skilled Therapeutic Interventions/Progress Updates:    Pt seen for OT ADL bathing/dressing session. Pt in supine upon arrival and with encouragement, agreeable to tx session. She voiced 8/10 pain in her back and reported being premedicated prior to tx session. She transferred to EOB using hospital bed functions with assist of bed pad to shift hips She completed bathing/dressing seated EOB, requiring cues and encouragement for participation and sequencing. Pt very self-limiting, however, given increased amount of time and encouragement pt able to complete basic bathing/dressing tasks. She completed stand pivot transfer to [facility] with RW, requiring increased assist for sit <> stand and min A for stand pivot. Required total A for toileting needs. She ambulated ~81ft to w/c with min A, and completed grooming tasks seated in w/c at sink with increased time.  Pt left seated in w/c at end of session, all needs in reach.  Educated extensively regarding role of OT, importance of participation with ADLs, benefits of participation with tx session, and d/c planning.   Therapy Documentation Precautions:  Precautions Precautions: Fall Restrictions Weight Bearing Restrictions:  Yes LLE Weight Bearing: Weight bearing as tolerated Pain: Pain Assessment Pain Assessment: 0-10 Pain Score: 7  Pain Type: Chronic pain Pain Location: Leg (& back) Pain Orientation: Left Pain Onset: On-going Patients Stated Pain Goal: 0 Pain Intervention(s): RN made aware  See Function Navigator for Current Functional Status.   Therapy/Group: Individual Therapy  Lewis, Enolia Koepke C 01/22/2016, 6:35 AM

## 2016-01-22 NOTE — Progress Notes (Signed)
Speech Language Pathology Daily Session Note  Patient Details  Name: Bonnie Salazar MRN: [id number] Date of Birth: 03-24-1941  Today's Date: 01/22/2016 SLP Individual Time: 1135-1205 SLP Individual Time Calculation (min): 30 min  Short Term Goals: Week 1: SLP Short Term Goal 1 (Week 1): Pt will consume dys 3 textures and thin liquids with mod I use of swallowing precautions and minimal overt s/s of aspiration or fatigue over 3 consecutive sessions.  SLP Short Term Goal 2 (Week 1): Pt will consume trials of regular textures with mod I use of swallowing precautions and no overt s/s of aspiration or fatigue over 3 consecutive sessions prior to advancement.   SLP Short Term Goal 3 (Week 1): Pt will utilize external aids to recall new, semi-complex information Mod I SLP Short Term Goal 4 (Week 1): Pt will utilize word finding strategies during verbal expression of complex ideas Mod I  SLP Short Term Goal 5 (Week 1): Pt will solve complex problem realted to home management tasks with Supervision level verbal cues   Skilled Therapeutic Interventions: Skilled treatment session focused on cognitive and dysphagia goals. Patient independently requested to use the bathroom and required Max A verbal cues for sequencing and problem solving with transfer to the [facility]. SLP also facilitated session by providing skilled observation with lunch meal of Dys. 3 textures with thin liquids. Patient consumed meal without overt s/s of aspiration and was Mod I for use of swallow strategies. Patient left upright in wheelchair with family present. Continue with current plan of care.    Function:  Eating Eating   Modified Consistency Diet: Yes Eating Assist Level: More than reasonable amount of time;Supervision or verbal cues           Cognition Comprehension Comprehension assist level: Understands basic 75 - 89% of the time/ requires cueing 10 - 24% of the time  Expression   Expression assist level: Expresses  basic needs/ideas: With no assist  Social Interaction Social Interaction assist level: Interacts appropriately 75 - 89% of the time - Needs redirection for appropriate language or to initiate interaction.  Problem Solving Problem solving assist level: Solves basic 75 - 89% of the time/requires cueing 10 - 24% of the time  Memory Memory assist level: Recognizes or recalls 50 - 74% of the time/requires cueing 25 - 49% of the time    Pain Pain Assessment Pain Assessment: 0-10 Pain Score: 5  Pain Location: Leg (& back) Pain Orientation: Left Pain Intervention(s): RN made aware  Therapy/Group: Individual Therapy  Alfred Eckley 01/22/2016, 3:15 PM

## 2016-01-22 NOTE — Progress Notes (Addendum)
ANTICOAGULATION CONSULT NOTE - Follow Up Consult  Pharmacy Consult for heparin Indication: New DVTs  Allergies  Allergen Reactions  . Hydrocodone     Confusion/sedation    Patient Measurements: Height: 5\' 7"  (170.2 cm) Weight: 140 lb 9.6 oz (63.776 kg) IBW/kg (Calculated) : 61.6 Heparin Dosing Weight: 62 kg  Vital Signs: Temp: 99 F (37.2 C) (07/13 0500) Temp Source: Oral (07/13 0500) BP: 122/62 mmHg (07/13 0825) Pulse Rate: 74 (07/13 0825)  Labs:  Recent Labs  01/21/16 0817 01/21/16 1301 01/21/16 1839 01/22/16 0415 01/22/16 0416 01/22/16 1132  HGB 9.5*  --   --  10.1*  --   --   HCT 30.9*  --   --  32.5*  --   --   PLT 260  --   --  263  --   --   HEPARINUNFRC 0.20*  --  0.22*  --  0.56 0.49  CREATININE 0.62 0.54  --  0.45  --   --     Estimated Creatinine Clearance: 60 mL/min (by C-G formula based on Cr of 0.45).   Assessment: 75 y.o. female with new DVTs on IV heparin. MD requests no bolus and slow titration due to h/o GI bleed and pelvic hematoma.heparin level 0.49, therapeutic on 1150 units/hr. CBC stable, No bleeding noted per chart.   Goal of Therapy:  Heparin level 0.3-0.7 units/ml Monitor platelets by anticoagulation protocol: Yes   Plan:  Continue heparin 1150 units/hr Monitor daily HL, CBC, s/s of bleed  Maryanna Shape, PharmD, BCPS  Clinical Pharmacist  Pager: [phone]   01/22/2016 1:05 PM

## 2016-01-22 NOTE — Progress Notes (Signed)
[facility] PHYSICAL MEDICINE & REHABILITATION     PROGRESS NOTE  Subjective/Complaints:  Pt laying in bed.  She did not sleep well because of people coming in and out of her room.  Overall, she had a good day in therapies yesterday.   ROS: Denies CP, SOB, N/V/D.  Objective: Vital Signs: Blood pressure 126/62, pulse 88, temperature 99 F (37.2 C), temperature source Oral, resp. rate 18, height 5\' 7"  (1.702 m), weight 63.776 kg (140 lb 9.6 oz), SpO2 94 %. Ct Abdomen Pelvis W Contrast  01/21/2016  CLINICAL DATA:  Lower abdominal pain, swelling, possible small bowel obstruction. EXAM: CT ABDOMEN AND PELVIS WITH CONTRAST TECHNIQUE: Multidetector CT imaging of the abdomen and pelvis was performed using the standard protocol following bolus administration of intravenous contrast. CONTRAST:  161mL ISOVUE-300 IOPAMIDOL (ISOVUE-300) INJECTION 61% COMPARISON:  12/28/2015 FINDINGS: The lung bases are unremarkable. Heart size within normal limits. Enhanced liver is unremarkable. No calcified gallstones are noted within gallbladder. Enhanced pancreas is unremarkable. Enhanced spleen is unremarkable. 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 There is no gastric outlet obstruction. No small bowel obstruction. Atherosclerotic calcifications of abdominal aorta and iliac arteries again noted. No aortic aneurysm. IVC filter in place again noted. Again noted again noted mixed density pelvic hematoma measures 15 x 13.5 cm. The hematoma is extending in anterior lower abdomen about 14.5 cm cranial caudally without significant change from prior exam. Again noted mass effect on anterior aspect of the urinary bladder. 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 There is some heterogeneous low density resolving hematoma within left psoas muscle axial image 64 measures 2.4 cm. Some hematoma is noted in left iliopsoas muscle axial image 68 measures 2.1 cm. There is  also resolving hematoma just anterior to hip prosthesis within anterior thigh muscle on the left side axial image 95 measures 4.4 cm. There is no evidence of active bleeding or contrast extravasation. Bilateral mild hydronephrosis. No left hydroureter. Mild delay excretion bilateral ureter on delayed images. Mild right hydroureter. No destructive bony lesions are noted within pelvis. Small amount of air within urinary bladder probable post instrumentation. Sagittal images of the spine shows stable degenerative changes lumbar spine. There is no evidence of free abdominal air. No evidence of distal colonic obstruction. No pericecal inflammation. Terminal ileum is unremarkable. Mild anasarca infiltration subcutaneous fat bilateral pelvic wall. IMPRESSION: 1. There is no evidence of small bowel obstruction or colonic obstruction. 2. Again noted mixed density resolving hematoma in lower abdomen and pelvis. The hematoma measures 15 x 13.5 by 14.5 cm. Some old appearing hematoma is noted in left psoas muscle and left iliopsoas muscle. There is old appearing hematoma just anterolateral to left hip joint in left upper thigh muscle axial image 92 measures at least 4.4 cm. No evidence of active bleeding or contrast extravasation. 3. IVC filter in place. 4. Mild bilateral hydronephrosis. Mild right hydroureter. No ureteral calculi are noted bilaterally. 5. No evidence of free abdominal air. 6. 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 Electronically Signed   By: Lahoma Crocker M.D.   On: 01/21/2016 17:08    Recent Labs  01/21/16 0817 01/22/16 0415  WBC 6.7 7.1  HGB 9.5* 10.1*  HCT 30.9* 32.5*  PLT 260 263    Recent Labs  01/21/16 1301 01/22/16 0415  NA 134* 136  K 2.9* 3.2*  CL 99* 101  GLUCOSE 151* 118*  BUN 8 5*  CREATININE 0.54  0.45  CALCIUM 8.5* 8.8*   CBG (last 3)   Recent Labs  01/19/16 1120 01/19/16 1637  GLUCAP 144* 125*    Wt Readings from Last 3 Encounters:   01/22/16 63.776 kg (140 lb 9.6 oz)  01/16/16 63.4 kg (139 lb 12.4 oz)    Physical Exam:  BP 126/62 mmHg  Pulse 88  Temp(Src) 99 F (37.2 C) (Oral)  Resp 18  Ht 5\' 7"  (1.702 m)  Wt 63.776 kg (140 lb 9.6 oz)  BMI 22.02 kg/m2  SpO2 94% Constitutional: She appears well-developed and well-nourished. NAD.  HENT: Normocephalic and atraumatic.  Eyes: Conjunctivae and EOM are normal.  Cardiovascular: Normal rate and regular rhythm.  Respiratory: Effort normal and breath sounds normal. No stridor. No respiratory distress. She has no wheezes.  GI: +Firm lower abdomen. She exhibits distension. There is no tenderness.  Musculoskeletal: She exhibits edema and tenderness.  Edema LLE.  Neurological: She is alert and oriented.  Able to follow commands without difficulty.  Motor: B/l UE: 4+/5 proximal to distal RLE: Hip flexion 4/5, knee extension 4/5, ankle dorsi/plantar flexion 5/5 LLE: Hip flexion 3/5, knee extension 3/5, ankle dorsi/plantar flexion 5/5 Skin: Skin is warm and dry.  Left anterior hip incision healing.  Psychiatric: She is slowed. Cognition and memory are normal.   Assessment/Plan: 1. Functional deficits secondary to debility after left hip fracture complicated by arterial bleed, hemorrhagic shock, bilateral PE, PEA, Atrial thrombus, GIB. medical issues. which require 3+ hours per day of interdisciplinary therapy in a comprehensive inpatient rehab setting. Physiatrist is providing close team supervision and 24 hour management of active medical problems listed below. Physiatrist and rehab team continue to assess barriers to discharge/monitor patient progress toward functional and medical goals.  Function:  Bathing Bathing position   Position: Sitting EOB  Bathing parts Body parts bathed by patient: Right arm, Left arm, Chest, Abdomen, Right upper leg, Left upper leg Body parts bathed by helper: Right lower leg, Left lower leg, Back  Bathing assist Assist Level:  Touching or steadying assistance(Pt > 75%)      Upper Body Dressing/Undressing Upper body dressing Upper body dressing/undressing activity did not occur: Refused What is the patient wearing?: Pull over shirt/dress     Pull over shirt/dress - Perfomed by patient: Thread/unthread right sleeve, Put head through opening, Pull shirt over trunk Pull over shirt/dress - Perfomed by helper: Thread/unthread left sleeve        Upper body assist Assist Level: Touching or steadying assistance(Pt > 75%)   Set up : To obtain clothing/put away  Lower Body Dressing/Undressing Lower body dressing   What is the patient wearing?: Pants, Non-skid slipper socks     Pants- Performed by patient: Thread/unthread right pants leg Pants- Performed by helper: Thread/unthread left pants leg, Pull pants up/down   Non-skid slipper socks- Performed by helper: Don/doff right sock, Don/doff left sock               TED Hose - Performed by helper: Don/doff right TED hose, Don/doff left TED hose  Lower body assist Assist for lower body dressing:  (max a)      Toileting Toileting Toileting activity did not occur: No continent bowel/bladder event   Toileting steps completed by helper: Adjust clothing prior to toileting, Performs perineal hygiene, Adjust clothing after toileting Toileting Assistive Devices: Other (comment) (RW and BSC)  Toileting assist Assist level: Touching or steadying assistance (Pt.75%)   Transfers Chair/bed transfer Chair/bed transfer activity did not occur: Refused Chair/bed  transfer method: Stand pivot Chair/bed transfer assist level: Maximal assist (Pt 25 - 49%/lift and lower) Chair/bed transfer assistive device: Medical sales representative Ambulation activity did not occur: Refused   Max distance: 5 Assist level: Touching or steadying assistance (Pt > 75%)   Wheelchair   Type: Manual Max wheelchair distance: 10' (pt refused to leave room) Assist Level: Supervision or  verbal cues  Cognition Comprehension Comprehension assist level: Follows basic conversation/direction with no assist  Expression Expression assist level: Expresses basic needs/ideas: With no assist  Social Interaction Social Interaction assist level: Interacts appropriately 90% of the time - Needs monitoring or encouragement for participation or interaction.  Problem Solving Problem solving assist level: Solves basic 90% of the time/requires cueing < 10% of the time  Memory Memory assist level: Recognizes or recalls 90% of the time/requires cueing < 10% of the time    Medical Problem List and Plan: 1. Abnormality of gait, weakness, low endurance secondary to debility after left hip fracture complicated by arterial bleed, hemorrhagic shock, bilateral PE, PEA, Atrial thrombus, GIB. medical issues.  -Cont CIR  2. Bilateral PE/DVT Prophylaxis/Anticoagulation:   SCDs d/ced due to clots  IVC filter in place.   Hx of GIB.   LE U/S showing extensive b/l DVTs, per Heme started IV Heparin without bolus and consider transition to Lovenox in ~5 days (7/16) if no bleeding. Appreciate recs. 3. Pain Management: Will schedule tylenol. Tramadol added for prn use.  4. Mood: Showing activation and humor. LCSW to follow for evaluation and support.  5. Neuropsych: This patient is capable of making decisions on her own behalf. 6. Skin/Wound Care: Routine pressure relief measures. Monitor incision daily for healing.  7. Fluids/Electrolytes/Nutrition: Monitor I/O.   BMP within acceptable range on 7/8  Advanced to dysphagia 3, thin diet, will cont to advance as tolerated 8. RA thrombus: Resolved post thrombolysis  Repeat Echo ordered per Cardiology, reviewed, no thrombus, slightly improved 9. PAF: In NSR on Cardizem, cardiology consulted and diltiazem d/ced, per pt preference.    Will titrate metoprolol as needed, stable at present.    Monitor HR bid.  10 GIB:H/H stable. Continue PPI.   Will continue to  monitor for recurrent melena or hematochezia.  Confounding bladder scans.  KUB on 7/7 reviewed, showing moderate stool  Abd CT reviewed from 7/12, showing resolving hematoma 11. ABLA:   Has been treated with multiple units PRBC post arterial hemorrhage/GIB.   Hb 10.1 on 7/13 (trending up)  Serial CBCs ordered 12. Dysphagia: Tolerating diet advancement. Po intake improving 13. Chronic low back pain: Lumbar films reviewed, 7/7 stable.  Added kpad for local measures 14. Constipation: Resolved  Increased bowel reg again on 7/9  Suppository ordered on 7/9 15. UTI  UA with bacteria  Ucx with multiple species, reorded  Empiric keflex started on 7/10 16. Hypokalemia  K+ 3.2 on 7/13  K+ 40 daily supplement  LOS (Days) 6 A FACE TO FACE EVALUATION WAS PERFORMED  Ankit Lorie Phenix 01/22/2016 7:53 AM

## 2016-01-22 NOTE — Progress Notes (Signed)
Physical Therapy Session Note  Patient Details  Name: Bonnie Salazar MRN: [id number] Date of Birth: 1940-12-24  Today's Date: 01/22/2016 PT Individual Time: 1030-1130 PT Individual Time Calculation (min): 60 min   Short Term Goals: Week 1:  PT Short Term Goal 1 (Week 1): Pt will perform bed mobility with mod A and 50% cues for initiation and sequencing PT Short Term Goal 2 (Week 1): Pt will perform bed <> chair transfers stand pivot with mod A and 50% cues to initiate and sequence PT Short Term Goal 3 (Week 1): Pt will perform gait x 50' with RW and mod A  PT Short Term Goal 4 (Week 1): Pt will negotiate 4 stairs with 2 rails for LE strengthening and mod A PT Short Term Goal 5 (Week 1): Pt will perform w/c mobility x 100' with bilat UE for UE strengthening and endurance  Skilled Therapeutic Interventions/Progress Updates:    Pt received in w/c & agreeable to PT, noting 7-8/10 back & LLE pain; RN notified. Pt performed BLE ankle pumps then propelled w/c x 120 ft for BUE strengthening & endurance training with supervision, cuing for steering to complete turns & significantly extra time. Pt required physical min A to lock w/c brakes as pt with decreased strength & inability to do so independently.  Pt able to complete sit<>stand transfers with mod A, with cuing to scoot forward in chair, anterior weight shift & to push up with BUE from w/c armrests. Pt also requires cuing to square buttocks up to chair & reach back with BUE prior to sitting. Pt required mod A for balance during transfers. Gait training x 10 ft + 15 ft + 20 ft with RW & min A with pt demonstrating decreased step length BLE, decreased stride length, decreased gait speed, & step-to gait pattern leading with LLE. Pt reports significant fatigue after each ambulation trial; educated pt on pursed lip breathing. At end of session pt left sitting in w/c in room with all needs within reach.  Therapy Documentation Precautions:   Precautions Precautions: Fall Restrictions Weight Bearing Restrictions: Yes LLE Weight Bearing: Weight bearing as tolerated  Pain: Pain Assessment Pain Assessment: 0-10 Pain Score: 7  Pain Location: Back & LLE Pain Intervention(s): RN made aware   See Function Navigator for Current Functional Status.   Therapy/Group: Individual Therapy  Waunita Schooner 01/22/2016, 12:10 PM

## 2016-01-22 NOTE — Progress Notes (Signed)
[facility] of 07/10 and 7/11 shows multispecies. Has been on Keflex for 3+ days and will discontinue to avoid SE. Received two runs of K with po 100 meq KCL with rise to 3.2. Will recheck BMET in am.

## 2016-01-23 ENCOUNTER — Inpatient Hospital Stay (HOSPITAL_COMMUNITY): Payer: Medicare HMO | Admitting: Speech Pathology

## 2016-01-23 ENCOUNTER — Inpatient Hospital Stay (HOSPITAL_COMMUNITY): Payer: Medicare HMO | Admitting: Occupational Therapy

## 2016-01-23 ENCOUNTER — Inpatient Hospital Stay (HOSPITAL_COMMUNITY): Payer: Medicare HMO

## 2016-01-23 ENCOUNTER — Inpatient Hospital Stay (HOSPITAL_COMMUNITY): Payer: Medicare HMO | Admitting: Physical Therapy

## 2016-01-23 DIAGNOSIS — R339 Retention of urine, unspecified: Secondary | ICD-10-CM | POA: Insufficient documentation

## 2016-01-23 LAB — BASIC METABOLIC PANEL
ANION GAP: 7 (ref 5–15)
BUN: 5 mg/dL — ABNORMAL LOW (ref 6–20)
CALCIUM: 9 mg/dL (ref 8.9–10.3)
CO2: 28 mmol/L (ref 22–32)
Chloride: 99 mmol/L — ABNORMAL LOW (ref 101–111)
Creatinine, Ser: 0.52 mg/dL (ref 0.44–1.00)
GFR calc Af Amer: >60 mL/min (ref >60)
Glucose, Bld: 109 mg/dL — ABNORMAL HIGH (ref 65–99)
POTASSIUM: 3.7 mmol/L (ref 3.5–5.1)
SODIUM: 134 mmol/L — AB (ref 135–145)

## 2016-01-23 LAB — CBC
HCT: 34.1 % — ABNORMAL LOW (ref 36.0–46.0)
HEMOGLOBIN: 10.5 g/dL — AB (ref 12.0–15.0)
MCH: 29.5 pg (ref 26.0–34.0)
MCHC: 30.8 g/dL (ref 30.0–36.0)
MCV: 95.8 fL (ref 78.0–100.0)
PLATELETS: 289 10*3/uL (ref 150–400)
RBC: 3.56 MIL/uL — AB (ref 3.87–5.11)
RDW: 16.7 % — ABNORMAL HIGH (ref 11.5–15.5)
WBC: 6.7 10*3/uL (ref 4.0–10.5)

## 2016-01-23 LAB — HEPARIN LEVEL (UNFRACTIONATED): HEPARIN UNFRACTIONATED: 0.55 [IU]/mL (ref 0.30–0.70)

## 2016-01-23 MED ORDER — POTASSIUM CHLORIDE 20 MEQ/15ML (10%) PO SOLN
20.0000 meq | Freq: Two times a day (BID) | ORAL | Status: DC
  Administered 2016-01-23 – 2016-01-31 (×18): 20 meq via ORAL
  Filled 2016-01-23 (×17): qty 15

## 2016-01-23 NOTE — Progress Notes (Signed)
ANTICOAGULATION CONSULT NOTE - Follow Up Consult  Pharmacy Consult for Heparin Indication: DVT (new)  Allergies  Allergen Reactions  . Hydrocodone     Confusion/sedation    Patient Measurements: Height: 5\' 7"  (170.2 cm) Weight: 135 lb (61.236 kg) IBW/kg (Calculated) : 61.6 Vital Signs: Temp: 98.7 F (37.1 C) (07/14 0547) Temp Source: Oral (07/14 0547) BP: 135/68 mmHg (07/14 0547) Pulse Rate: 94 (07/14 0547)  Labs:  Recent Labs  01/21/16 0817 01/21/16 1301  01/22/16 0415 01/22/16 0416 01/22/16 1132 01/23/16 0337  HGB 9.5*  --   --  10.1*  --   --  10.5*  HCT 30.9*  --   --  32.5*  --   --  34.1*  PLT 260  --   --  263  --   --  289  HEPARINUNFRC 0.20*  --   < >  --  0.56 0.49 0.55  CREATININE 0.62 0.54  --  0.45  --   --  0.52  < > = values in this interval not displayed.  Estimated Creatinine Clearance: 59.6 mL/min (by C-G formula based on Cr of 0.52).   Assessment: 75 y.o. female with new DVTs on IV heparin. MD requests no bolus and slow titration due to h/o GI bleed and pelvic hematoma.Heparin level 0.55, therapeutic on 1150 units/hr & within range of levels on current rate. CBC stable, No bleeding noted per chart.  Goal of Therapy:  Heparin level 0.3-0.7 units/ml Monitor platelets by anticoagulation protocol: Yes   Plan:  Continue heparin 1150 units/hr Watch for s/s of bleeding F/U AM labs   Jaquita Folds 01/23/2016,8:00 AM

## 2016-01-23 NOTE — Progress Notes (Signed)
Physical Therapy Weekly Progress Note  Patient Details  Name: Bonnie Salazar MRN: [id number] Date of Birth: 1941-07-03  Beginning of progress report period: January 17, 2016 End of progress report period: January 23, 2016  Today's Date: 01/23/2016 PT Individual Time: 0900-1000 PT Individual Time Calculation (min): 60 min  Session focused on bed mobility re-training on flat bed (mod verbal cues for sequencing and to problem solve technique) with min assist to manage LLE, functional transfers in room including toilet transfers, dynamic standing balance for toileting needs and hygiene, set-up for oral hygiene at sink from w/c level, and car transfer training (overall min assist). Rest breaks needed throughout due to fatigue and encouragement/education about recovery process ongoing. Pt required overall min assist today with mobility.    Patient has met 3 of 5 short term goals.  Pt is making slow but functional progress. Continues to be limited by generalized debility, pain, and decreased motivation. Currently pt requires min to mod assist for short distance mobility and transfers.   Patient continues to demonstrate the following deficits: decreased strength, decreased balance, decreased ROM, edema, decreased functional mobility, decreased activity tolerance, pain and therefore will continue to benefit from skilled PT intervention to enhance overall performance with activity tolerance, balance, ability to compensate for deficits, functional use of  left lower extremity, attention and awareness.  Patient progressing toward long term goals..  Continue plan of care.  PT Short Term Goals Week 1:  PT Short Term Goal 1 (Week 1): Pt will perform bed mobility with mod A and 50% cues for initiation and sequencing PT Short Term Goal 1 - Progress (Week 1): Met PT Short Term Goal 2 (Week 1): Pt will perform bed <> chair transfers stand pivot with mod A and 50% cues to initiate and sequence PT Short Term Goal 2 -  Progress (Week 1): Met PT Short Term Goal 3 (Week 1): Pt will perform gait x 50' with RW and mod A  PT Short Term Goal 3 - Progress (Week 1): Not met PT Short Term Goal 4 (Week 1): Pt will negotiate 4 stairs with 2 rails for LE strengthening and mod A PT Short Term Goal 4 - Progress (Week 1): Not met PT Short Term Goal 5 (Week 1): Pt will perform w/c mobility x 100' with bilat UE for UE strengthening and endurance PT Short Term Goal 5 - Progress (Week 1): Met Week 2:  PT Short Term Goal 1 (Week 2): Pt will be able to perform basic transfers consistently with min assist and min verbal cues  PT Short Term Goal 2 (Week 2): Pt will be able to progress gait up to 50' with min assist PT Short Term Goal 3 (Week 2): Pt will be able to negotiate 4 stairs with 2 rails for LE strengthening and mod assist  Skilled Therapeutic Interventions/Progress Updates:  Ambulation/gait training;Discharge planning;Functional mobility training;Psychosocial support;Therapeutic Activities;Balance/vestibular training;Neuromuscular re-education;Therapeutic Exercise;Wheelchair propulsion/positioning;Cognitive remediation/compensation;DME/adaptive equipment instruction;Pain management;Splinting/orthotics;UE/LE Strength taining/ROM;Community reintegration;Patient/family education;Stair training;Skin care/wound management;UE/LE Coordination activities   Therapy Documentation Precautions:  Precautions Precautions: Fall Restrictions Weight Bearing Restrictions: Yes LLE Weight Bearing: Weight bearing as tolerated  Pain: Premedicated - no complaints.  See Function Navigator for Current Functional Status.  Therapy/Group: Individual Therapy  [name], PT, DPT  01/23/2016, 12:07 PM

## 2016-01-23 NOTE — Progress Notes (Signed)
Physical Therapy Session Note  Patient Details  Name: Bonnie Salazar MRN: [id number] Date of Birth: Feb 12, 1941  Today's Date: 01/23/2016 PT Individual Time: 1300-1330 PT Individual Time Calculation (min): 30 min    Skilled Therapeutic Interventions/Progress Updates:    Pt received seated in w/c, c/o pain as described below and agreeable to treatment. Gait x50' with RW with min guard faded to close S; slow speed. Performed toilet transfer to [facility] in room with min guard and RW. Pt performed doffing pants/brief and initial attempt at hygiene, however requested therapist wipe again to ensure she was clean, and unable to manage brief/pants without assist. Gait x10' with RW and S to return to recliner. Remained seated in recliner with all needs in reach and husband present at end of session.   Therapy Documentation Precautions:  Precautions Precautions: Fall Restrictions Weight Bearing Restrictions: Yes LLE Weight Bearing: Weight bearing as tolerated Pain: Pain Assessment Pain Score: 5  Pain Type: Chronic pain Pain Location: Back Patients Stated Pain Goal: 0 Pain Intervention(s): Repositioned   See Function Navigator for Current Functional Status.   Therapy/Group: Individual Therapy  Luberta Mutter 01/23/2016, 3:49 PM

## 2016-01-23 NOTE — Progress Notes (Signed)
Speech Language Pathology Weekly Progress and Session Note  Patient Details  Name: Bonnie Salazar MRN: [id number] Date of Birth: 06-06-1941  Beginning of progress report period: January 17, 2016 End of progress report period: January 23, 2016  Today's Date: 01/23/2016 SLP Individual Time: 0800-0900 SLP Individual Time Calculation (min): 60 min  Short Term Goals: Week 1: SLP Short Term Goal 1 (Week 1): Pt will consume dys 3 textures and thin liquids with mod I use of swallowing precautions and minimal overt s/s of aspiration or fatigue over 3 consecutive sessions.  SLP Short Term Goal 1 - Progress (Week 1): Partly met SLP Short Term Goal 2 (Week 1): Pt will consume trials of regular textures with mod I use of swallowing precautions and no overt s/s of aspiration or fatigue over 3 consecutive sessions prior to advancement.   SLP Short Term Goal 2 - Progress (Week 1): Partly met SLP Short Term Goal 3 (Week 1): Pt will utilize external aids to recall new, semi-complex information Mod I SLP Short Term Goal 3 - Progress (Week 1): Progressing toward goal SLP Short Term Goal 4 (Week 1): Pt will utilize word finding strategies during verbal expression of complex ideas Mod I  SLP Short Term Goal 4 - Progress (Week 1): Progressing toward goal SLP Short Term Goal 5 (Week 1): Pt will solve complex problem realted to home management tasks with Supervision level verbal cues  SLP Short Term Goal 5 - Progress (Week 1): Progressing toward goal    New Short Term Goals: Week 2: SLP Short Term Goal 1 (Week 2): Pt will consume current diet with mod I use of swallowing precautions and minimal overt s/s of aspiration or fatigue over 3 consecutive sessions.  SLP Short Term Goal 2 (Week 2): Pt will consume trials of regular textures with mod I use of swallowing precautions withou overt s/s of aspiration or fatigue to demonstrate readiness for  advancement.   SLP Short Term Goal 3 (Week 2): Pt will utilize external  aids to recall new, semi-complex information Mod I SLP Short Term Goal 4 (Week 2): Pt will solve complex problem related to home management tasks with Supervision level verbal cues  SLP Short Term Goal 5 (Week 2): Pt will utilize word finding strategies during verbal expression of complex ideas Mod I   Weekly Progress Updates:  Primary focus of this initial week on rehab has been pt's dysphagia. Pt is demonstrating improved awareness of need for compensatory strategies and compliance with strategies with only occasional verbal cueing. Goals have been added to address cognition. Pt is quite anxious which is an interfering component with therapy, but she benefits from positive encouragement.    Intensity: Minumum of 1-2 x/day, 30 to 90 minutes Frequency: 3 to 5 out of 7 days Duration/Length of Stay: 14-21 days  Treatment/Interventions: Cueing hierarchy;Environmental controls;Functional tasks;Internal/external aids;Patient/family education;Cognitive remediation/compensation;Medication managment;Speech/Language facilitation   Daily Session  Skilled Therapeutic Interventions: Pt seen with Dys 3 tray and trials of regular. Adequate mastication noted, however pt continues to be fatigued. Discussed foods to avoid on a regular diet secondary to fatigue- pt's spouse indicated they would avoid tough meats and salads. Will consider upgrade to regular early next week with pt's spouse ordering food. Mod I this date for swallow strategies with 1 episode of coughing following liquids noted (that was not part of a strategy). Pt required mod A to recall education from previous date re: admission to hospital and medications. Pt able to recall general ideas re:  reason for meds and diagnoses with min A of verbal cueing.      Function:   Eating Eating   Modified Consistency Diet: Yes Eating Assist Level: More than reasonable amount of time;Supervision or verbal cues;Set up assist for   Eating Set Up Assist For:  Opening containers;Cutting food       Cognition Comprehension Comprehension assist level: Understands basic 75 - 89% of the time/ requires cueing 10 - 24% of the time  Expression   Expression assist level: Expresses basic needs/ideas: With no assist  Social Interaction Social Interaction assist level: Interacts appropriately 75 - 89% of the time - Needs redirection for appropriate language or to initiate interaction.  Problem Solving Problem solving assist level: Solves basic 75 - 89% of the time/requires cueing 10 - 24% of the time  Memory Memory assist level: Recognizes or recalls 50 - 74% of the time/requires cueing 25 - 49% of the time   General    Pain Pain Assessment Pain Score: 5  Pain Type: Chronic pain Pain Location: Back Patients Stated Pain Goal: 0 Pain Intervention(s): Repositioned  Therapy/Group: Individual Therapy  Vinetta Bergamo MA, CCC-SLP 01/23/2016, 12:13 PM

## 2016-01-23 NOTE — Progress Notes (Signed)
Occupational Therapy Weekly Progress Note  Patient Details  Name: Bonnie Salazar MRN: [id number] Date of Birth: June 05, 1941  Beginning of progress report period: January 17, 2016 End of progress report period: January 23, 2016  Today's Date: 01/23/2016 OT Individual Time: 1100-1200 OT Individual Time Calculation (min): 60 min    Patient has met 1 of 3 short term goals.  Pt making slow progress towards OT goals. She is severely limited by impaired cognition, perseveration of weaknesses/ current status, self limitation and generalized weakness.  LTG remain appropriate at this time, however, if slow progress cont may need to downgrade goals.  Patient continues to demonstrate the following deficits: cognitive deficits, muscle weakness (generalized) and pain in lower back and L hip and therefore will continue to benefit from skilled OT intervention to enhance overall performance with BADL and Reduce care partner burden.  Patient progressing toward long term goals..  Continue plan of care.   OT Short Term Goals Week 1:  OT Short Term Goal 1 (Week 1): Pt will engage in 5 minutes of functional task before needing rest break secondary to fatigue.  OT Short Term Goal 1 - Progress (Week 1): Not met OT Short Term Goal 2 (Week 1): Pt will perform toilet transfer with mod A in order to increase I with functional transfers.  OT Short Term Goal 2 - Progress (Week 1): Not met OT Short Term Goal 3 (Week 1): Pt will perform UB dressing with set up A in order to increase I with self care.  OT Short Term Goal 3 - Progress (Week 1): Met OT Short Term Goal 4 (Week 1): Pt will perform LB dressing with max A in order to decrease level of assistance with self care. Week 2:  OT Short Term Goal 1 (Week 2): Pt will perform toileting task with min A OT Short Term Goal 2 (Week 2): Pt will dress LB using AE without need for VCs OT Short Term Goal 3 (Week 2): Pt will complete 1 grooming task standing at sink in order to  increase functional activity tolerance  Skilled Therapeutic Interventions/Progress Updates:    Pt seen for OT ADL bathing/dressing session. Pt sitting up in w/c upon arrival voicing need for toileting task. Husband present, however, left during session. Min A stand pivot completed to [facility] with VCs for sequencing and safety awareness with IV lines. She completed bathing/dressing from [facility] with set-up and VCs for sequencing. With increased time, encouragement, and cues for use pt used reacher, LH sponge, and sock aid to assist with LB bathing/dressing. Pt with decreased recall of techniques of AE, requiring cues throughout for proper use. Sit <> stand at [place] completed from [facility] for hygiene to be completed with total A. Once pants within reach pt able to pull pants up with steadying assist. She returned to w/c and cont to practice donning/doffing socks with use of AE. Educated throughout session regarding importance of participation and independence with ADLs, benefits of mobility, AE, and d/c planning.  Pt cont to be limited in therapy session by self limiting behaviors, however, with much better participation and performance today compared to previous sessions.   Therapy Documentation Precautions:  Precautions Precautions: Fall Restrictions Weight Bearing Restrictions: Yes LLE Weight Bearing: Weight bearing as tolerated Pain: Pain Assessment Pain Assessment: No/denies pain  See Function Navigator for Current Functional Status.   Therapy/Group: Individual Therapy  Lewis, Deliana Avalos C 01/23/2016, 7:09 AM

## 2016-01-23 NOTE — Progress Notes (Signed)
Bonnie Salazar PHYSICAL MEDICINE & REHABILITATION     PROGRESS NOTE  Subjective/Complaints:  Pt seen laying in bed.  Husband at bedside.  They have questions about the IV heparin and ask if one of her IVs may be d/ced.  ROS: Denies CP, SOB, N/V/D.  Objective: Vital Signs: Blood pressure 135/68, pulse 94, temperature 98.7 F (37.1 C), temperature source Oral, resp. rate 18, height 5\' 7"  (1.702 m), weight 61.236 kg (135 lb), SpO2 96 %. Ct Abdomen Pelvis W Contrast  01/21/2016  CLINICAL DATA:  Lower abdominal pain, swelling, possible small bowel obstruction. EXAM: CT ABDOMEN AND PELVIS WITH CONTRAST TECHNIQUE: Multidetector CT imaging of the abdomen and pelvis was performed using the standard protocol following bolus administration of intravenous contrast. CONTRAST:  19mL ISOVUE-300 IOPAMIDOL (ISOVUE-300) INJECTION 61% COMPARISON:  12/28/2015 FINDINGS: The lung bases are unremarkable. Heart size within normal limits. Enhanced liver is unremarkable. No calcified gallstones are noted within gallbladder. Enhanced pancreas is unremarkable. Enhanced spleen is unremarkable. 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 There is no gastric outlet obstruction. No small bowel obstruction. Atherosclerotic calcifications of abdominal aorta and iliac arteries again noted. No aortic aneurysm. IVC filter in place again noted. Again noted again noted mixed density pelvic hematoma measures 15 x 13.5 cm. The hematoma is extending in anterior lower abdomen about 14.5 cm cranial caudally without significant change from prior exam. Again noted mass effect on anterior aspect of the urinary bladder. 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 There is some heterogeneous low density resolving hematoma within left psoas muscle axial image 64 measures 2.4 cm. Some hematoma is noted in left iliopsoas muscle axial image 68 measures 2.1 cm. There is also resolving hematoma  just anterior to hip prosthesis within anterior thigh muscle on the left side axial image 95 measures 4.4 cm. There is no evidence of active bleeding or contrast extravasation. Bilateral mild hydronephrosis. No left hydroureter. Mild delay excretion bilateral ureter on delayed images. Mild right hydroureter. No destructive bony lesions are noted within pelvis. Small amount of air within urinary bladder probable post instrumentation. Sagittal images of the spine shows stable degenerative changes lumbar spine. There is no evidence of free abdominal air. No evidence of distal colonic obstruction. No pericecal inflammation. Terminal ileum is unremarkable. Mild anasarca infiltration subcutaneous fat bilateral pelvic wall. IMPRESSION: 1. There is no evidence of small bowel obstruction or colonic obstruction. 2. Again noted mixed density resolving hematoma in lower abdomen and pelvis. The hematoma measures 15 x 13.5 by 14.5 cm. Some old appearing hematoma is noted in left psoas muscle and left iliopsoas muscle. There is old appearing hematoma just anterolateral to left hip joint in left upper thigh muscle axial image 92 measures at least 4.4 cm. No evidence of active bleeding or contrast extravasation. 3. IVC filter in place. 4. Mild bilateral hydronephrosis. Mild right hydroureter. No ureteral calculi are noted bilaterally. 5. No evidence of free abdominal air. 6. 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 Electronically Signed   By: Lahoma Crocker M.D.   On: 01/21/2016 17:08    Recent Labs  01/22/16 0415 01/23/16 0337  WBC 7.1 6.7  HGB 10.1* 10.5*  HCT 32.5* 34.1*  PLT 263 289    Recent Labs  01/22/16 0415 01/23/16 0337  NA 136 134*  K 3.2* 3.7  CL 101 99*  GLUCOSE 118* 109*  BUN 5* 5*  CREATININE 0.45 0.52  CALCIUM 8.8* 9.0  CBG (last 3)  No results for input(s): GLUCAP in the last 72 hours.  Wt Readings from Last 3 Encounters:  01/23/16 61.236 kg (135 lb)  01/16/16  63.4 kg (139 lb 12.4 oz)    Physical Exam:  BP 135/68 mmHg  Pulse 94  Temp(Src) 98.7 F (37.1 C) (Oral)  Resp 18  Ht 5\' 7"  (1.702 m)  Wt 61.236 kg (135 lb)  BMI 21.14 kg/m2  SpO2 96% Constitutional: She appears well-developed and well-nourished. NAD.  HENT: Normocephalic and atraumatic.  Eyes: Conjunctivae and EOM are normal.  Cardiovascular: Normal rate and regular rhythm.  Respiratory: Effort normal and breath sounds normal. No stridor. No respiratory distress. She has no wheezes.  GI: +Firm lower abdomen (slowly improving). She exhibits distension. There is no tenderness.  Musculoskeletal: She exhibits edema and tenderness.  Edema LLE.  Neurological: She is alert and oriented.  Able to follow commands without difficulty.  Motor: B/l UE: 4+/5 proximal to distal RLE: Hip flexion 4/5, knee extension 4/5, ankle dorsi/plantar flexion 5/5 LLE: Hip flexion 3/5, knee extension 3/5, ankle dorsi/plantar flexion 5/5 Skin: Skin is warm and dry.  Left anterior hip incision healing.  Psychiatric: She is slowed. Cognition and memory are normal.   Assessment/Plan: 1. Functional deficits secondary to debility after left hip fracture complicated by arterial bleed, hemorrhagic shock, bilateral PE, PEA, Atrial thrombus, GIB. medical issues. which require 3+ hours per day of interdisciplinary therapy in a comprehensive inpatient rehab setting. Physiatrist is providing close team supervision and 24 hour management of active medical problems listed below. Physiatrist and rehab team continue to assess barriers to discharge/monitor patient progress toward functional and medical goals.  Function:  Bathing Bathing position   Position: Sitting EOB  Bathing parts Body parts bathed by patient: Right arm, Left arm, Chest, Abdomen, Right upper leg, Left upper leg, Right lower leg, Left lower leg, Front perineal area Body parts bathed by helper: Buttocks, Back  Bathing assist Assist Level: Touching  or steadying assistance(Pt > 75%) (LH sponge)      Upper Body Dressing/Undressing Upper body dressing Upper body dressing/undressing activity did not occur: Refused What is the patient wearing?: Pull over shirt/dress     Pull over shirt/dress - Perfomed by patient: Thread/unthread right sleeve, Put head through opening, Pull shirt over trunk, Thread/unthread left sleeve Pull over shirt/dress - Perfomed by helper: Thread/unthread left sleeve        Upper body assist Assist Level: Supervision or verbal cues, Set up   Set up : To obtain clothing/put away  Lower Body Dressing/Undressing Lower body dressing   What is the patient wearing?: Pants, Non-skid slipper socks     Pants- Performed by patient: Thread/unthread right pants leg, Thread/unthread left pants leg Pants- Performed by helper: Pull pants up/down   Non-skid slipper socks- Performed by helper: Don/doff right sock, Don/doff left sock               TED Hose - Performed by helper: Don/doff right TED hose, Don/doff left TED hose  Lower body assist Assist for lower body dressing: 2 Helpers      Toileting Toileting Toileting activity did not occur: No continent bowel/bladder event   Toileting steps completed by helper: Adjust clothing prior to toileting, Performs perineal hygiene, Adjust clothing after toileting Toileting Assistive Devices:  (RW and BSC)  Toileting assist Assist level: Two helpers   Transfers Chair/bed transfer Chair/bed transfer activity did not occur: Refused Chair/bed transfer method: Ambulatory Chair/bed transfer assist level: Moderate assist (  Pt 50 - 74%/lift or lower) Chair/bed transfer assistive device: Financial risk analyst activity did not occur: Refused   Max distance: 20 ft Assist level: Touching or steadying assistance (Pt > 75%)   Wheelchair   Type: Manual Max wheelchair distance: 160 ft Assist Level: Supervision or verbal cues  Cognition Comprehension  Comprehension assist level: Understands basic 75 - 89% of the time/ requires cueing 10 - 24% of the time  Expression Expression assist level: Expresses complex ideas: With no assist  Social Interaction Social Interaction assist level: Interacts appropriately 75 - 89% of the time - Needs redirection for appropriate language or to initiate interaction.  Problem Solving Problem solving assist level: Solves basic 75 - 89% of the time/requires cueing 10 - 24% of the time  Memory Memory assist level: Recognizes or recalls 50 - 74% of the time/requires cueing 25 - 49% of the time    Medical Problem List and Plan: 1. Abnormality of gait, weakness, low endurance secondary to debility after left hip fracture complicated by arterial bleed, hemorrhagic shock, bilateral PE, PEA, Atrial thrombus, GIB. medical issues.  -Cont CIR  2. Bilateral PE/DVT Prophylaxis/Anticoagulation:   SCDs d/ced due to clots  IVC filter in place.   Hx of GIB.   LE U/S showing extensive b/l DVTs, per Heme started IV Heparin without bolus and consider transition to Lovenox in ~5 days (7/16) if no bleeding. Appreciate recs. 3. Pain Management: Will schedule tylenol. Tramadol added for prn use.  4. Mood: Showing activation and humor. LCSW to follow for evaluation and support.  5. Neuropsych: This patient is capable of making decisions on her own behalf. 6. Skin/Wound Care: Routine pressure relief measures. Monitor incision daily for healing.  7. Fluids/Electrolytes/Nutrition: Monitor I/O.   BMP within acceptable range on 7/8  Advanced to dysphagia 3, thin diet, will cont to advance as tolerated 8. RA thrombus: Resolved post thrombolysis  Repeat Echo ordered per Cardiology, reviewed, no thrombus, slightly improved 9. PAF: In NSR on Cardizem, cardiology consulted and diltiazem d/ced, per pt preference.    Will titrate metoprolol as needed, stable at present.    Monitor HR bid.  10 GIB:H/H stable. Continue PPI.   Will  continue to monitor for recurrent melena or hematochezia.  Confounding bladder scans.  KUB on 7/7 reviewed, showing moderate stool  Abd CT reviewed from 7/12, showing resolving hematoma 11. ABLA:   Has been treated with multiple units PRBC post arterial hemorrhage/GIB.   Hb 10.5 on 7/14 (trending up)  Serial CBCs ordered 12. Dysphagia: Tolerating diet advancement. Po intake improving 13. Chronic low back pain: Lumbar films reviewed, 7/7 stable.  Added kpad for local measures 14. Constipation: Resolved  Increased bowel reg again on 7/9  Suppository ordered on 7/9 15. UTI  UA with bacteria  Ucx with multiple species, reorder pending  Empiric keflex started on 7/10 16. Hypokalemia  K+ 3.7 on 7/14  K+ decreased to 20+ daily supplement  LOS (Days) 7 A FACE TO FACE EVALUATION WAS PERFORMED  Ankit Lorie Phenix 01/23/2016 8:16 AM

## 2016-01-24 ENCOUNTER — Inpatient Hospital Stay (HOSPITAL_COMMUNITY): Payer: Medicare HMO | Admitting: Speech Pathology

## 2016-01-24 LAB — URINE CULTURE

## 2016-01-24 LAB — CBC
HCT: 33.8 % — ABNORMAL LOW (ref 36.0–46.0)
HEMOGLOBIN: 10.6 g/dL — AB (ref 12.0–15.0)
MCH: 29.9 pg (ref 26.0–34.0)
MCHC: 31.4 g/dL (ref 30.0–36.0)
MCV: 95.5 fL (ref 78.0–100.0)
Platelets: 295 10*3/uL (ref 150–400)
RBC: 3.54 MIL/uL — AB (ref 3.87–5.11)
RDW: 16.7 % — ABNORMAL HIGH (ref 11.5–15.5)
WBC: 8 10*3/uL (ref 4.0–10.5)

## 2016-01-24 LAB — GLUCOSE, CAPILLARY: Glucose-Capillary: 117 mg/dL — ABNORMAL HIGH (ref 65–99)

## 2016-01-24 LAB — HEPARIN LEVEL (UNFRACTIONATED): HEPARIN UNFRACTIONATED: 0.38 [IU]/mL (ref 0.30–0.70)

## 2016-01-24 MED ORDER — LOPERAMIDE HCL 2 MG PO CAPS
2.0000 mg | ORAL_CAPSULE | Freq: Three times a day (TID) | ORAL | Status: DC | PRN
  Filled 2016-01-24: qty 1

## 2016-01-24 MED ORDER — CIPROFLOXACIN HCL 500 MG PO TABS
250.0000 mg | ORAL_TABLET | Freq: Two times a day (BID) | ORAL | Status: DC
  Administered 2016-01-24 – 2016-01-30 (×13): 250 mg via ORAL
  Filled 2016-01-24 (×13): qty 1

## 2016-01-24 MED ORDER — SENNOSIDES-DOCUSATE SODIUM 8.6-50 MG PO TABS: 1.0000 | ORAL_TABLET | Freq: Two times a day (BID) | ORAL | Status: DC | PRN

## 2016-01-24 MED ORDER — POLYETHYLENE GLYCOL 3350 17 G PO PACK: 17.0000 g | PACK | Freq: Every day | ORAL | Status: DC | PRN

## 2016-01-24 NOTE — Progress Notes (Signed)
Patient experiencing new onset diarrhea. Contacted MD. Orders given to monitor and offer PRN immodium. Also family requested MD review UA results. Orders given to start Cipro.

## 2016-01-24 NOTE — Progress Notes (Signed)
Patient ID: Bonnie Salazar, female   DOB: 1941-03-20, 75 y.o.   MRN: [id number]   01/24/16.  [facility]     PROGRESS NOTE  Subjective/Complaints:  Pt seen laying in bed.  Husband at bedside.  Remains on IV heparin.  Only complaint is urinary frequency and occasional incontinence.   ROS: Denies CP, SOB, N/V/D.  Objective: Vital Signs: Blood pressure 129/60, pulse 88, temperature 98.5 F (36.9 C), temperature source Oral, resp. rate 18, height 5\' 7"  (1.702 m), weight 133 lb (60.328 kg), SpO2 95 %. No results found.  Recent Labs  01/23/16 0337 01/24/16 0533  WBC 6.7 8.0  HGB 10.5* 10.6*  HCT 34.1* 33.8*  PLT 289 295    Recent Labs  01/22/16 0415 01/23/16 0337  NA 136 134*  K 3.2* 3.7  CL 101 99*  GLUCOSE 118* 109*  BUN 5* 5*  CREATININE 0.45 0.52  CALCIUM 8.8* 9.0   CBG (last 3)   Recent Labs  01/24/16 0646  GLUCAP 117*    Wt Readings from Last 3 Encounters:  01/24/16 133 lb (60.328 kg)  01/16/16 139 lb 12.4 oz (63.4 kg)    Physical Exam:  BP 129/60 mmHg  Pulse 88  Temp(Src) 98.5 F (36.9 C) (Oral)  Resp 18  Ht 5\' 7"  (1.702 m)  Wt 133 lb (60.328 kg)  BMI 20.83 kg/m2  SpO2 95% Constitutional: She appears well-developed and well-nourished. NAD.  HENT: Normocephalic and atraumatic.  Eyes: Conjunctivae and EOM are normal.  Cardiovascular: Normal rate and regular rhythm.  Respiratory: Effort normal and breath sounds normal. No stridor. No respiratory distress. She has no wheezes.  GI: Soft and nontender  Edema LLE.  Neurological: She is alert and oriented.  Able to follow commands without difficulty.   Skin: Skin is warm and dry.  IV infusion, right arm Left anterior hip incision healing.  Psychiatric: She is slowed. Cognition and memory are normal.     Medical Problem List and Plan: 1. Abnormality of gait, weakness, low endurance secondary to debility after left hip fracture complicated by arterial  bleed, hemorrhagic shock, bilateral PE, PEA, Atrial thrombus, GIB. medical issues.  -Cont CIR  2. Bilateral PE/DVT Prophylaxis/Anticoagulation:     IVC filter in place.   Hx of GIB.   LE U/S showing extensive b/l DVTs, per Heme started IV Heparin without bolus and consider transition to Lovenox in ~5 days (7/16) if no bleeding.  3. Pain Management: Will schedule tylenol. Tramadol added for prn use.   4. Skin/Wound Care: Routine pressure relief measures. Monitor incision daily for healing.   5. PAF: In NSR on Cardizem, cardiology consulted and diltiazem d/ced, per pt preference.    Will titrate metoprolol as needed, stable at present.    Monitor HR bid.  6.  GIB:H/H stable. Continue PPI.   Will continue to monitor for recurrent melena or hematochezia.   Abd CT reviewed from 7/12, showing resolving hematoma 7. ABLA:   Has been treated with multiple units PRBC post arterial hemorrhage/GIB.   Hb 10.5 on 7/14 (trending up)  Serial CBCs ordered   8. Hypokalemia  K+ 3.7 on 7/14  K+ decreased to 20+ daily supplement  LOS (Days) 8 A FACE TO FACE EVALUATION WAS PERFORMED  Nyoka Cowden 01/24/2016 8:45 AM

## 2016-01-24 NOTE — Progress Notes (Signed)
ANTICOAGULATION CONSULT NOTE - Follow Up Consult  Pharmacy Consult for Heparin Indication: DVT (new)  Allergies  Allergen Reactions  . Hydrocodone     Confusion/sedation    Patient Measurements: Height: 5\' 7"  (170.2 cm) Weight: 133 lb (60.328 kg) IBW/kg (Calculated) : 61.6 Vital Signs: Temp: 98.5 F (36.9 C) (07/15 0436) Temp Source: Oral (07/15 0436) BP: 121/59 mmHg (07/15 0859) Pulse Rate: 101 (07/15 0859)  Labs:  Recent Labs  01/21/16 1301  01/22/16 0415  01/22/16 1132 01/23/16 0337 01/24/16 0533  HGB  --   < > 10.1*  --   --  10.5* 10.6*  HCT  --   --  32.5*  --   --  34.1* 33.8*  PLT  --   --  263  --   --  289 295  HEPARINUNFRC  --   < >  --   < > 0.49 0.55 0.38  CREATININE 0.54  --  0.45  --   --  0.52  --   < > = values in this interval not displayed.  Estimated Creatinine Clearance: 58.7 mL/min (by C-G formula based on Cr of 0.52).   Assessment: 75 y.o. female with new DVTs on IV heparin. MD requests no bolus and slow titration due to h/o GI bleed and pelvic hematoma.Heparin level 0.38, therapeutic on 1150 units/hr & within range of levels on current rate. CBC stable, No bleeding noted per chart.  Goal of Therapy:  Heparin level 0.3-0.7 units/ml Monitor platelets by anticoagulation protocol: Yes   Plan:  Continue heparin 1150 units/hr Check anti-Xa level daily while on heparin Continue to monitor H&H and platelets   Thank you for allowing [place] to participate in this patients care. Jens Som, PharmD Pager: [phone]  01/24/2016,9:35 AM

## 2016-01-24 NOTE — Progress Notes (Signed)
Speech Language Pathology Daily Session Note  Patient Details  Name: LAINA GUERRIERI MRN: [id number] Date of Birth: 1941-05-07  Today's Date: 01/24/2016 SLP Individual Time: 1415-1500 SLP Individual Time Calculation (min): 45 min  Short Term Goals: Week 2: SLP Short Term Goal 1 (Week 2): Pt will consume current diet with mod I use of swallowing precautions and minimal overt s/s of aspiration or fatigue over 3 consecutive sessions.  SLP Short Term Goal 2 (Week 2): Pt will consume trials of regular textures with mod I use of swallowing precautions withou overt s/s of aspiration or fatigue to demonstrate readiness for  advancement.   SLP Short Term Goal 3 (Week 2): Pt will utilize external aids to recall new, semi-complex information Mod I SLP Short Term Goal 4 (Week 2): Pt will solve complex problem related to home management tasks with Supervision level verbal cues  SLP Short Term Goal 5 (Week 2): Pt will utilize word finding strategies during verbal expression of complex ideas Mod I  SLP Short Term Goal 5 - Progress (Week 2): Partly met  Skilled Therapeutic Interventions: Skilled treatment session focused on cognition goals. SLP facilitated the session by providing supervision level verbal cues for complex problem solving related to home management tasks. Pt able to utilize external aids to recall ne semi-complex information with supervision cues. Pt communicated semi-complex ideas without word finding deficits this session. Pt left upright in recliner, in husband's care and all needs within reach. Continue current plan of care.   Function:    Cognition Comprehension Comprehension assist level: Understands basic 75 - 89% of the time/ requires cueing 10 - 24% of the time  Expression   Expression assist level: Expresses basic needs/ideas: With no assist  Social Interaction Social Interaction assist level: Interacts appropriately 75 - 89% of the time - Needs redirection for appropriate  language or to initiate interaction.  Problem Solving Problem solving assist level: Solves basic 75 - 89% of the time/requires cueing 10 - 24% of the time  Memory Memory assist level: Recognizes or recalls 50 - 74% of the time/requires cueing 25 - 49% of the time    Pain Pain Assessment Pain Assessment: 0-10 Pain Score: 7  Pain Type: Chronic pain Pain Location: Back Pain Orientation: Lower Pain Onset: On-going Pain Intervention(s): Medication (See eMAR)  Therapy/Group: Individual Therapy  Megann Easterwood 01/24/2016, 3:30 PM

## 2016-01-25 LAB — CBC
HEMATOCRIT: 32.2 % — AB (ref 36.0–46.0)
Hemoglobin: 10.3 g/dL — ABNORMAL LOW (ref 12.0–15.0)
MCH: 30.6 pg (ref 26.0–34.0)
MCHC: 32 g/dL (ref 30.0–36.0)
MCV: 95.5 fL (ref 78.0–100.0)
Platelets: 308 10*3/uL (ref 150–400)
RBC: 3.37 MIL/uL — AB (ref 3.87–5.11)
RDW: 17.1 % — ABNORMAL HIGH (ref 11.5–15.5)
WBC: 7.2 10*3/uL (ref 4.0–10.5)

## 2016-01-25 LAB — HEPARIN LEVEL (UNFRACTIONATED)
Heparin Unfractionated: <0.1 IU/mL — ABNORMAL LOW (ref 0.30–0.70)
Heparin Unfractionated: 0.38 IU/mL (ref 0.30–0.70)

## 2016-01-25 MED ORDER — PANTOPRAZOLE SODIUM 40 MG PO TBEC
40.0000 mg | DELAYED_RELEASE_TABLET | Freq: Every day | ORAL | Status: DC
  Administered 2016-01-25 – 2016-02-04 (×11): 40 mg via ORAL
  Filled 2016-01-25 (×11): qty 1

## 2016-01-25 NOTE — Progress Notes (Signed)
Patient ID: Bonnie Salazar, female   DOB: 10-Jun-1941, 75 y.o.   MRN: [id number]   Patient ID: Bonnie Salazar, female   DOB: July 15, 1940, 75 y.o.   MRN: [id number]   01/25/16.  [facility]     PROGRESS NOTE  Subjective/Complaints:  Doing well.  Husband at bedside.  Remains on IV heparin.  Only complaint is urinary frequency and occasional incontinence.  Started on Cipro, first dose today for pseudomonal UTI.  ROS: Denies CP, SOB, N/V/D.  Objective: Vital Signs: Blood pressure 117/62, pulse 88, temperature 98.2 F (36.8 C), temperature source Oral, resp. rate 18, height 5\' 7"  (1.702 m), weight 134 lb 11.2 oz (61.1 kg), SpO2 95 %. No results found.  Recent Labs  01/24/16 0533 01/25/16 0426  WBC 8.0 7.2  HGB 10.6* 10.3*  HCT 33.8* 32.2*  PLT 295 308    Recent Labs  01/23/16 0337  NA 134*  K 3.7  CL 99*  GLUCOSE 109*  BUN 5*  CREATININE 0.52  CALCIUM 9.0   CBG (last 3)   Recent Labs  01/24/16 0646  GLUCAP 117*    Wt Readings from Last 3 Encounters:  01/25/16 134 lb 11.2 oz (61.1 kg)  01/16/16 139 lb 12.4 oz (63.4 kg)    Physical Exam:  BP 117/62 mmHg  Pulse 88  Temp(Src) 98.2 F (36.8 C) (Oral)  Resp 18  Ht 5\' 7"  (1.702 m)  Wt 134 lb 11.2 oz (61.1 kg)  BMI 21.09 kg/m2  SpO2 95% Constitutional: She appears well-developed and well-nourished. NAD.  HENT: Normocephalic and atraumatic.  Eyes: Conjunctivae and EOM are normal.  Cardiovascular: Normal rate and regular rhythm.  Respiratory: Effort normal and breath sounds normal. No stridor. No respiratory distress. She has no wheezes.  GI: Soft and nontender  Edema LLE.  Neurological: She is alert and oriented.  Able to follow commands without difficulty.   Skin: Skin is warm and dry.  IV infusion, right arm Left anterior hip incision healing.  Psychiatric: She is slowed. Cognition and memory are normal.     Medical Problem List and Plan: 1. Abnormality of  gait, weakness, low endurance secondary to debility after left hip fracture complicated by arterial bleed, hemorrhagic shock, bilateral PE, PEA, Atrial thrombus, GIB. medical issues.  -Cont CIR  2. Bilateral PE/DVT Prophylaxis/Anticoagulation:     IVC filter in place.   Hx of GIB.   LE U/S showing extensive b/l DVTs, per Heme started IV Heparin without bolus and consider transition to Lovenox in ~5 days (7/16) if no bleeding.  3. Pain Management: Will schedule tylenol. Tramadol added for prn use.   4. Skin/Wound Care: Routine pressure relief measures. Monitor incision daily for healing.   5. PAF: In NSR on Cardizem, cardiology consulted and diltiazem d/ced, per pt preference.    Will titrate metoprolol as needed, stable at present.    Monitor HR bid.  6.  GIB:H/H stable. Continue PPI.   Will continue to monitor for recurrent melena or hematochezia.   Abd CT reviewed from 7/12, showing resolving hematoma 7. ABLA:   Has been treated with multiple units PRBC post arterial hemorrhage/GIB.   Hb 10.5 on 7/14 (trending up)  Serial CBCs ordered   8. Hypokalemia  K+ 3.7 on 7/14  K+ decreased to 20+ daily supplement  9.  Pseudomonal UTI.  Cipro 250 twice a day for 7 days  LOS (Days) 9 A FACE TO FACE EVALUATION WAS PERFORMED  Nyoka Cowden 01/25/2016  8:27 AM

## 2016-01-25 NOTE — Progress Notes (Signed)
ANTICOAGULATION CONSULT NOTE - Follow Up Consult  Pharmacy Consult for Heparin Indication: DVT (new)  Allergies  Allergen Reactions  . Hydrocodone     Confusion/sedation    Patient Measurements: Height: 5\' 7"  (170.2 cm) Weight: 134 lb 11.2 oz (61.1 kg) IBW/kg (Calculated) : 61.6 Vital Signs: Temp: 98.2 F (36.8 C) (07/16 0522) Temp Source: Oral (07/16 0522) BP: 129/53 mmHg (07/16 0943) Pulse Rate: 98 (07/16 0943)  Labs:  Recent Labs  01/23/16 0337 01/24/16 0533 01/25/16 0426 01/25/16 1233  HGB 10.5* 10.6* 10.3*  --   HCT 34.1* 33.8* 32.2*  --   PLT 289 295 308  --   HEPARINUNFRC 0.55 0.38 <0.10* <0.10*  CREATININE 0.52  --   --   --     Estimated Creatinine Clearance: 59.5 mL/min (by C-G formula based on Cr of 0.52).   Assessment: 75 y.o. female with new DVTs on IV heparin. MD requests no bolus and slow titration due to h/o GI bleed and pelvic hematoma.Heparin level came back undetectable this morning and this afternoon. Spoke to RN and she believes the heparin was off overnight and says she restarted it this morning at 1250 units/hr. I will adjust the rate to her previously therapeutic rate of 1150 units/hr  and follow up with an 8 hour heparin level. CBC remains stable, No bleeding noted.  Goal of Therapy:  Heparin level 0.3-0.7 units/ml Monitor platelets by anticoagulation protocol: Yes   Plan:  Decrease heparin to 1150 units/hr Check anti-Xa level in 8 hours and daily while on heparin Continue to monitor H&H and platelets   Thank you for allowing [place] to participate in this patients care. Jens Som, PharmD Pager: [phone]  01/25/2016,2:16 PM

## 2016-01-25 NOTE — Progress Notes (Signed)
ANTICOAGULATION CONSULT NOTE - Follow Up Consult  Pharmacy Consult for heparin Indication: DVT  Labs:  Recent Labs  01/23/16 0337 01/24/16 0533 01/25/16 0426  HGB 10.5* 10.6* 10.3*  HCT 34.1* 33.8* 32.2*  PLT 289 295 308  HEPARINUNFRC 0.55 0.38 <0.10*  CREATININE 0.52  --   --     Assessment: 75yo female now undetectable on heparin after several levels at goal though had begun to trend down. No gtt or other issues overnight per RN.  Goal of Therapy:  Heparin level 0.3-0.7 units/ml   Plan:  Will increase heparin gtt conservatively to 1250 units/hr and check level in 6hr.  Wynona Neat, PharmD, BCPS  01/25/2016,6:25 AM

## 2016-01-25 NOTE — Progress Notes (Signed)
ANTICOAGULATION CONSULT NOTE - Follow Up Consult  Pharmacy Consult for Heparin Indication: DVT (new)  Allergies  Allergen Reactions  . Hydrocodone     Confusion/sedation    Patient Measurements: Height: 5\' 7"  (170.2 cm) Weight: 134 lb 11.2 oz (61.1 kg) IBW/kg (Calculated) : 61.6 Vital Signs: Temp: 98.4 F (36.9 C) (07/16 1645) Temp Source: Oral (07/16 1645) BP: 112/50 mmHg (07/16 2041) Pulse Rate: 86 (07/16 2041)  Labs:  Recent Labs  01/23/16 0337 01/24/16 0533 01/25/16 0426 01/25/16 1233 01/25/16 2100  HGB 10.5* 10.6* 10.3*  --   --   HCT 34.1* 33.8* 32.2*  --   --   PLT 289 295 308  --   --   HEPARINUNFRC 0.55 0.38 <0.10* <0.10* 0.38  CREATININE 0.52  --   --   --   --     Estimated Creatinine Clearance: 59.5 mL/min (by C-G formula based on Cr of 0.52).   Assessment: 75 y.o. female with new DVTs on IV heparin. MD requests no bolus and slow titration due to h/o GI bleed and pelvic hematoma.Heparin level therapeutic on 1150 units/hr. N bleeding noted.  Goal of Therapy:  Heparin level 0.3-0.7 units/ml Monitor platelets by anticoagulation protocol: Yes   Plan:  Continue heparin at 1150 units/hr F/u a.m. heparin level to confirm therapeutic  Sherlon Handing, PharmD, BCPS Clinical pharmacist, pager [phone] 01/25/2016,11:14 PM

## 2016-01-26 ENCOUNTER — Encounter (HOSPITAL_COMMUNITY): Payer: Medicare HMO

## 2016-01-26 ENCOUNTER — Inpatient Hospital Stay (HOSPITAL_COMMUNITY): Payer: Medicare HMO | Admitting: Occupational Therapy

## 2016-01-26 ENCOUNTER — Inpatient Hospital Stay (HOSPITAL_COMMUNITY): Payer: Medicare HMO | Admitting: Speech Pathology

## 2016-01-26 ENCOUNTER — Inpatient Hospital Stay (HOSPITAL_COMMUNITY): Payer: Medicare HMO

## 2016-01-26 LAB — CBC
HEMATOCRIT: 35.8 % — AB (ref 36.0–46.0)
HEMOGLOBIN: 11.1 g/dL — AB (ref 12.0–15.0)
MCH: 30.1 pg (ref 26.0–34.0)
MCHC: 31 g/dL (ref 30.0–36.0)
MCV: 97 fL (ref 78.0–100.0)
Platelets: 325 10*3/uL (ref 150–400)
RBC: 3.69 MIL/uL — ABNORMAL LOW (ref 3.87–5.11)
RDW: 17.3 % — AB (ref 11.5–15.5)
WBC: 7.6 10*3/uL (ref 4.0–10.5)

## 2016-01-26 LAB — HEPARIN LEVEL (UNFRACTIONATED): Heparin Unfractionated: 0.63 IU/mL (ref 0.30–0.70)

## 2016-01-26 MED ORDER — ENOXAPARIN SODIUM 60 MG/0.6ML ~~LOC~~ SOLN
60.0000 mg | Freq: Two times a day (BID) | SUBCUTANEOUS | Status: DC
  Administered 2016-01-26 – 2016-01-27 (×3): 60 mg via SUBCUTANEOUS
  Filled 2016-01-26 (×3): qty 0.6

## 2016-01-26 NOTE — Progress Notes (Signed)
Physical Therapy Session Note  Patient Details  Name: Bonnie Salazar MRN: [id number] Date of Birth: 1941/05/10  Today's Date: 01/26/2016 PT Individual Time: 1300-1400 PT Individual Time Calculation (min): 60 min   Short Term Goals: Week 2:  PT Short Term Goal 1 (Week 2): Pt will be able to perform basic transfers consistently with min assist and min verbal cues  PT Short Term Goal 2 (Week 2): Pt will be able to progress gait up to 50' with min assist PT Short Term Goal 3 (Week 2): Pt will be able to negotiate 4 stairs with 2 rails for LE strengthening and mod assist  Skilled Therapeutic Interventions/Progress Updates:    Session focused on w/c mobility for general strengthening and endurance, stair negotiation for community mobility and general strengthening, issued HEP for LLE strengthening and ROM and education on importance, gait training with RW with focus on step through pattern, positioning of RW, and upright posture, and toilet transfers once back in room. Supine therex included heel slides (AAROM), SAQ, LAQ, and quad sets x 10 reps each. Handout given but will review standing therex another day as pt unable to tolerate further due to pain.   Pt requires supervision to min assist with basic mobility using RW at this time with verbal cues for sequencing and hand placement as well as overall encouragement. Discussed with pt and husband at end of session overall goals and pt's good progress while on CIR per his questions. Set up in recliner with LE's elevated for edema control.   Therapy Documentation Precautions:  Precautions Precautions: Fall Restrictions Weight Bearing Restrictions: Yes LLE Weight Bearing: Weight bearing as tolerated  Pain: Pain in LLE during mobility - rest breaks as needed.    See Function Navigator for Current Functional Status.   Therapy/Group: Individual Therapy  [name], PT, DPT  01/26/2016, 3:28 PM

## 2016-01-26 NOTE — Progress Notes (Addendum)
ANTICOAGULATION CONSULT NOTE - Follow Up Consult  Pharmacy Consult for heparin Indication: DVT  Allergies  Allergen Reactions  . Hydrocodone     Confusion/sedation    Patient Measurements: Height: 5\' 7"  (170.2 cm) Weight: 132 lb 4.8 oz (60.011 kg) IBW/kg (Calculated) : 61.6 Heparin Dosing Weight:   Vital Signs: BP: 112/50 mmHg (07/16 2041) Pulse Rate: 86 (07/16 2041)  Labs:  Recent Labs  01/24/16 0533 01/25/16 0426 01/25/16 1233 01/25/16 2100  HGB 10.6* 10.3*  --   --   HCT 33.8* 32.2*  --   --   PLT 295 308  --   --   HEPARINUNFRC 0.38 <0.10* <0.10* 0.38    Estimated Creatinine Clearance: 58.4 mL/min (by C-G formula based on Cr of 0.52).   Medications:  Scheduled:  . acetaminophen  650 mg Oral TID WC & HS  . ciprofloxacin  250 mg Oral BID  . feeding supplement (ENSURE ENLIVE)  237 mL Oral BID BM  . Gerhardt's butt cream   Topical BID  . hydrocortisone-pramoxine  1 applicator Rectal BID  . metoprolol tartrate  25 mg Oral TID  . pantoprazole  40 mg Oral Daily  . potassium chloride  20 mEq Oral BID   Infusions:  . heparin 1,150 Units/hr (01/25/16 1410)    [name] is currently on therapeutic heparin.  Heparin level is 0.63; CBC stable.  Goal of Therapy:  Heparin level 0.3-0.7 units/ml Monitor platelets by anticoagulation protocol: Yes   Plan:  - continue heparin at 1150 units/hr - daily heparin level and CBC  Ronya Gilcrest, Tsz-Yin 01/26/2016,8:08 AM  Addendum. Changing heparin to lovenox for DVT.  CrCl 58; Hgb 11.1 and Plt 325 K  Plan: D/c heparin and heparin level.  Start lovenox 60 mg sq q12h 1 hr after CBC every 72 hours

## 2016-01-26 NOTE — Progress Notes (Signed)
Occupational Therapy Session Note  Patient Details  Name: Bonnie Salazar MRN: [id number] Date of Birth: [birth year]  Today's Date: 01/26/2016 OT Individual Time: 7353-2992 OT Individual Time Calculation (min): 73 min    Short Term Goals: Week 1:  OT Short Term Goal 1 (Week 1): Pt will engage in 5 minutes of functional task before needing rest break secondary to fatigue.  OT Short Term Goal 1 - Progress (Week 1): Not met OT Short Term Goal 2 (Week 1): Pt will perform toilet transfer with mod A in order to increase I with functional transfers.  OT Short Term Goal 2 - Progress (Week 1): Not met OT Short Term Goal 3 (Week 1): Pt will perform UB dressing with set up A in order to increase I with self care.  OT Short Term Goal 3 - Progress (Week 1): Met OT Short Term Goal 4 (Week 1): Pt will perform LB dressing with max A in order to decrease level of assistance with self care.  Skilled Therapeutic Interventions/Progress Updates:    Pt seen for OT session focusing on ADL re-training, functional ambulation, and caregiver training. Pt sitting in w/c upon arrival, agreeable to tx session. [name] present upon arrival, however, left room until near the end of tx session.  Pt still with continuous IV, so bathing/dressing completed from w/c level at sink. LH sponge utilized to assist with LB bathing with VCs for initiation and encouragement. She used reacher and sock aid to assist with LB dressing, able to recall technique for use with 1 VC.  She completed grooming tasks standing at sink with one UE support on sink ledge, tolerating ~2 minutes of static standing. She ambulated throughout unit with RW and CGA. +2 present to manage IV pole and w/c follow. She ambulated 64f and following seated rest break ambulated 74 ft. VCs and instruction provided for reciprocal step with RW with cues for 1-2 count to assist with rhythmic walking. She demonstrated much improved ambulation speed with this  method. Pt returned to room total A in w/c. Husband present in room and desiring to be checked off to [facility]. Demonstration provided and husband return demonstrated. Pt not wanting husband to assist with hygiene, and plans to cont to call nursing to assist with this aspect of toileting task. NT made aware. Pt with completed of urine frequency with suddent urge. Educated regarding timed toileting as method to reduce urgent and resulting unssafe toilet transfers or pt going in her brief if she is unable to get there in time. Pt left sitting in w/c at end of session, all needs in reach and husband present.   Therapy Documentation Precautions:  Precautions Precautions: Fall Restrictions Weight Bearing Restrictions: Yes LLE Weight Bearing: Weight bearing as tolerated Pain:   No/ denies pain  See Function Navigator for Current Functional Status.   Therapy/Group: Individual Therapy  Lewis, Luvern Mcisaac C 01/26/2016, 7:07 AM

## 2016-01-26 NOTE — Progress Notes (Signed)
[place] PHYSICAL MEDICINE & REHABILITATION     PROGRESS NOTE  Subjective/Complaints:  Having urinary frequency. Left leg still heavy.   ROS: Denies CP, SOB, N/V/D.  Objective: Vital Signs: Blood pressure 112/50, pulse 86, temperature 98.4 F (36.9 C), temperature source Oral, resp. rate 16, height 5\' 7"  (1.702 m), weight 60.011 kg (132 lb 4.8 oz), SpO2 96 %. No results found.  Recent Labs  01/25/16 0426 01/26/16 0743  WBC 7.2 7.6  HGB 10.3* 11.1*  HCT 32.2* 35.8*  PLT 308 325   No results for input(s): NA, K, CL, GLUCOSE, BUN, CREATININE, CALCIUM in the last 72 hours.  Invalid input(s): CO CBG (last 3)   Recent Labs  01/24/16 0646  GLUCAP 117*    Wt Readings from Last 3 Encounters:  01/25/16 60.011 kg (132 lb 4.8 oz)  01/16/16 63.4 kg (139 lb 12.4 oz)    Physical Exam:  BP 112/50 mmHg  Pulse 86  Temp(Src) 98.4 F (36.9 C) (Oral)  Resp 16  Ht 5\' 7"  (1.702 m)  Wt 60.011 kg (132 lb 4.8 oz)  BMI 20.72 kg/m2  SpO2 96% Constitutional: She appears well-developed and well-nourished. NAD.  HENT: Normocephalic and atraumatic.  Eyes: Conjunctivae and EOM are normal.  Cardiovascular: Normal rate and regular rhythm.  Respiratory: Effort normal and breath sounds normal. No stridor. No respiratory distress. She has no wheezes.  GI: + BS. She exhibits distension. There is no tenderness.  Musculoskeletal: She exhibits edema and tenderness.  Edema LLE 2+.  Neurological: She is alert and oriented.  Able to follow commands without difficulty.  Motor: B/l UE: 4+/5 proximal to distal RLE: Hip flexion 4/5, knee extension 4/5, ankle dorsi/plantar flexion 5/5 LLE: Hip flexion 3/5, knee extension 3/5, ankle dorsi/plantar flexion 5/5 Skin: Skin is warm and dry.  Left anterior hip incision healed  Psychiatric: She is slowed. Cognition and memory are normal.   Assessment/Plan: 1. Functional deficits secondary to debility after left hip fracture complicated by arterial  bleed, hemorrhagic shock, bilateral PE, PEA, Atrial thrombus, GIB. medical issues. which require 3+ hours per day of interdisciplinary therapy in a comprehensive inpatient rehab setting. Physiatrist is providing close team supervision and 24 hour management of active medical problems listed below. Physiatrist and rehab team continue to assess barriers to discharge/monitor patient progress toward functional and medical goals.  Function:  Bathing Bathing position   Position: Other (comment) (Sitting on BSC)  Bathing parts Body parts bathed by patient: Right arm, Left arm, Chest, Abdomen, Right upper leg, Left upper leg, Right lower leg, Left lower leg, Front perineal area Body parts bathed by helper: Buttocks, Back  Bathing assist Assist Level: Touching or steadying assistance(Pt > 75%) (LH sponge)      Upper Body Dressing/Undressing Upper body dressing Upper body dressing/undressing activity did not occur: Refused What is the patient wearing?: Pull over shirt/dress     Pull over shirt/dress - Perfomed by patient: Thread/unthread right sleeve, Put head through opening, Pull shirt over trunk, Thread/unthread left sleeve Pull over shirt/dress - Perfomed by helper: Thread/unthread left sleeve        Upper body assist Assist Level: Supervision or verbal cues, Set up   Set up : To obtain clothing/put away  Lower Body Dressing/Undressing Lower body dressing   What is the patient wearing?: Pants, Non-skid slipper socks     Pants- Performed by patient: Thread/unthread right pants leg, Thread/unthread left pants leg, Pull pants up/down Pants- Performed by helper: Pull pants up/down Non-skid slipper socks-  Performed by patient: Don/doff right sock, Don/doff left sock Non-skid slipper socks- Performed by helper: Don/doff right sock, Don/doff left sock               TED Hose - Performed by helper: Don/doff right TED hose, Don/doff left TED hose  Lower body assist Assist for lower body  dressing: Touching or steadying assistance (Pt > 75%)      Toileting Toileting Toileting activity did not occur: No continent bowel/bladder event Toileting steps completed by patient: Adjust clothing prior to toileting Toileting steps completed by helper: Adjust clothing prior to toileting, Performs perineal hygiene, Adjust clothing after toileting Toileting Assistive Devices: Other (comment)  Toileting assist Assist level: Supervision or verbal cues   Transfers Chair/bed transfer Chair/bed transfer activity did not occur: Refused Chair/bed transfer method: Ambulatory Chair/bed transfer assist level: Supervision or verbal cues Chair/bed transfer assistive device: Medical sales representative Ambulation activity did not occur: Refused   Max distance: 50 Assist level: Touching or steadying assistance (Pt > 75%)   Wheelchair   Type: Manual Max wheelchair distance: 160 ft Assist Level: Supervision or verbal cues  Cognition Comprehension Comprehension assist level: Follows basic conversation/direction with no assist  Expression Expression assist level: Expresses basic needs/ideas: With no assist  Social Interaction Social Interaction assist level: Interacts appropriately 90% of the time - Needs monitoring or encouragement for participation or interaction.  Problem Solving Problem solving assist level: Solves basic problems with no assist  Memory Memory assist level: Recognizes or recalls 90% of the time/requires cueing < 10% of the time    Medical Problem List and Plan: 1. Abnormality of gait, weakness, low endurance secondary to debility after left hip fracture complicated by arterial bleed, hemorrhagic shock, bilateral PE, PEA, Atrial thrombus, GIB. medical issues.  -Cont CIR   -making gradual gains 2. Bilateral PE/DVT Prophylaxis/Anticoagulation:   SCDs d/ced due to clots  IVC filter in place.   Hx of GIB.   LE U/S showing extensive b/l DVTs  -appreciate Heme  recs  -hgb stable/increasing.   -will convert to treatment dose lovenox today  -check labs again tomorrow including BMET 3. Pain Management: Will schedule tylenol. Tramadol added for prn use.  4. Mood: Showing activation and humor. LCSW to follow for evaluation and support.  5. Neuropsych: This patient is capable of making decisions on her own behalf. 6. Skin/Wound Care: Routine pressure relief measures. Monitor incision daily for healing.  7. Fluids/Electrolytes/Nutrition: Monitor I/O.   BMP within acceptable range on 7/14---recheck tomorrow  Advanced to dysphagia 3, thin diet, will cont to advance as tolerated 8. RA thrombus: Resolved post thrombolysis  Repeat Echo ordered per Cardiology, reviewed, no thrombus, slightly improved 9. PAF: In NSR on Cardizem, cardiology consulted and diltiazem d/ced, per pt preference.    Will titrate metoprolol as needed, stable at present.    Monitor HR bid.  10 GIB:H/H stable. Continue PPI.   Will continue to monitor for recurrent melena or hematochezia.  Confounding bladder scans.  KUB on 7/7 reviewed, showing moderate stool  Abd CT reviewed from 7/12, showing resolving hematoma 11. ABLA:   Has been treated with multiple units PRBC post arterial hemorrhage/GIB.   Hb 11.1--trending up. Labs personally reviewed 12. Dysphagia: Tolerating diet advancement. Po intake improving 13. Chronic low back pain: Lumbar films reviewed, 7/7 stable.  Added kpad for local measures 14. Constipation: Resolved    15. UTI  Pseudomonas--60k--cipro started on 7/15 16. Hypokalemia  K+ 3.7 on 7/14  K+ decreased to 20+ daily   LOS (Days) 10 A FACE TO FACE EVALUATION WAS PERFORMED  Breyden Jeudy T 01/26/2016 9:28 AM

## 2016-01-26 NOTE — Progress Notes (Signed)
Speech Language Pathology Daily Session Note  Patient Details  Name: Bonnie Salazar MRN: [id number] Date of Birth: 09-03-1940  Today's Date: 01/26/2016 SLP Individual Time: 1100-1200 SLP Individual Time Calculation (min): 60 min  Short Term Goals: Week 2: SLP Short Term Goal 1 (Week 2): Pt will consume current diet with mod I use of swallowing precautions and minimal overt s/s of aspiration or fatigue over 3 consecutive sessions.  SLP Short Term Goal 2 (Week 2): Pt will consume trials of regular textures with mod I use of swallowing precautions withou overt s/s of aspiration or fatigue to demonstrate readiness for  advancement.   SLP Short Term Goal 3 (Week 2): Pt will utilize external aids to recall new, semi-complex information Mod I SLP Short Term Goal 4 (Week 2): Pt will solve complex problem related to home management tasks with Supervision level verbal cues  SLP Short Term Goal 5 (Week 2): Pt will utilize word finding strategies during verbal expression of complex ideas Mod I  SLP Short Term Goal 5 - Progress (Week 2): Partly met  Skilled Therapeutic Interventions: Skilled treatment session focused on addressing dysphagia and cognition goals. SLP facilitated session by providing skilled observation of advanced textures at lunch.  SLP facilitated session with set-up assist and patient required increased time to demonstrate effective mastication and oral clearance of regular textures. Patient also Mod I with carryover of safe swallow strategies, which effectively prevented overt s/s of aspiration throughout meal.  SLP also facilitated session with increased time to recall daily events without the use of an external aid; patient able to recall recent medical recommendations/updates.  Recommend to continue with diet upgrade.      Function:  Eating Eating   Modified Consistency Diet: No Eating Assist Level: More than reasonable amount of time   Eating Set Up Assist For: Opening  containers       Cognition Comprehension Comprehension assist level: Follows complex conversation/direction with extra time/assistive device  Expression   Expression assist level: Expresses complex ideas: With extra time/assistive device  Social Interaction Social Interaction assist level: Interacts appropriately 90% of the time - Needs monitoring or encouragement for participation or interaction.  Problem Solving Problem solving assist level: Solves complex 90% of the time/cues < 10% of the time  Memory Memory assist level: More than reasonable amount of time    Pain Pain Assessment Pain Assessment: No/denies pain  Therapy/Group: Individual Therapy  Carmelia Roller., CCC-SLP [phone]  [place] 01/26/2016, 12:51 PM

## 2016-01-26 NOTE — Progress Notes (Signed)
Occupational Therapy Session Note  Patient Details  Name: Bonnie Salazar MRN: [id number] Date of Birth: Jul 27, 1940  Today's Date: 01/26/2016 OT Individual Time: [id number] OT Individual Time Calculation (min): 27 min    Skilled Therapeutic Interventions/Progress Updates:    Pt worked on Counsellor transfers using RW and 3:1.  Pt reports having two seats in the shower but is unsure if they are usable based on location and size.  Had pt step posteriorly over the shower edge using the RW for support to get into the shower with min assist.  Min assist for stepping forward over the edge to get back out of the shower.  Discussed that her husband may put in grab bars as well.  Finished session with work on Customer service manager to remove gripper sock on the left foot with min assist and then donn the sock back using the sockaide.  Min assist needed for donning sock over sockaide as well, but she was able to donn it over her foot with supervision after this.  Pt left in bedside recliner at end of session with call button in reach.   Therapy Documentation Precautions:  Precautions Precautions: Fall Restrictions Weight Bearing Restrictions: No LLE Weight Bearing: Weight bearing as tolerated  Pain: Pain Assessment Pain Assessment: Faces Faces Pain Scale: Hurts a little bit Pain Type: Acute pain Pain Location: Hip Pain Orientation: Left Pain Intervention(s): Repositioned ADL: See Function Navigator for Current Functional Status.   Therapy/Group: Individual Therapy  Modell Fendrick OTR/L 01/26/2016, 4:12 PM

## 2016-01-27 ENCOUNTER — Inpatient Hospital Stay (HOSPITAL_COMMUNITY): Payer: Medicare HMO | Admitting: Occupational Therapy

## 2016-01-27 ENCOUNTER — Inpatient Hospital Stay (HOSPITAL_COMMUNITY): Payer: Medicare HMO | Admitting: Speech Pathology

## 2016-01-27 ENCOUNTER — Inpatient Hospital Stay (HOSPITAL_COMMUNITY): Payer: Medicare HMO

## 2016-01-27 LAB — CBC
HCT: 33.4 % — ABNORMAL LOW (ref 36.0–46.0)
HEMOGLOBIN: 10.3 g/dL — AB (ref 12.0–15.0)
MCH: 29.9 pg (ref 26.0–34.0)
MCHC: 30.8 g/dL (ref 30.0–36.0)
MCV: 97.1 fL (ref 78.0–100.0)
Platelets: 335 10*3/uL (ref 150–400)
RBC: 3.44 MIL/uL — ABNORMAL LOW (ref 3.87–5.11)
RDW: 17.4 % — AB (ref 11.5–15.5)
WBC: 7.1 10*3/uL (ref 4.0–10.5)

## 2016-01-27 LAB — BASIC METABOLIC PANEL
Anion gap: 6 (ref 5–15)
BUN: 5 mg/dL — ABNORMAL LOW (ref 6–20)
CALCIUM: 9.2 mg/dL (ref 8.9–10.3)
CO2: 27 mmol/L (ref 22–32)
CREATININE: 0.56 mg/dL (ref 0.44–1.00)
Chloride: 104 mmol/L (ref 101–111)
GFR calc non Af Amer: >60 mL/min (ref >60)
Glucose, Bld: 105 mg/dL — ABNORMAL HIGH (ref 65–99)
Potassium: 3.7 mmol/L (ref 3.5–5.1)
SODIUM: 137 mmol/L (ref 135–145)

## 2016-01-27 MED ORDER — ENOXAPARIN SODIUM 60 MG/0.6ML ~~LOC~~ SOLN
60.0000 mg | Freq: Two times a day (BID) | SUBCUTANEOUS | Status: DC
  Administered 2016-01-28 – 2016-02-02 (×12): 60 mg via SUBCUTANEOUS
  Filled 2016-01-27 (×12): qty 0.6

## 2016-01-27 MED ORDER — LIDOCAINE HCL 2 % EX GEL
CUTANEOUS | Status: DC | PRN
  Filled 2016-01-27: qty 5

## 2016-01-27 MED ORDER — DICLOFENAC SODIUM 1 % TD GEL
2.0000 g | Freq: Four times a day (QID) | TRANSDERMAL | Status: DC
  Administered 2016-01-27 – 2016-02-04 (×30): 2 g via TOPICAL
  Filled 2016-01-27 (×2): qty 100

## 2016-01-27 MED ORDER — TAMSULOSIN HCL 0.4 MG PO CAPS
0.4000 mg | ORAL_CAPSULE | Freq: Every day | ORAL | Status: DC
  Administered 2016-01-27 – 2016-02-01 (×6): 0.4 mg via ORAL
  Filled 2016-01-27 (×6): qty 1

## 2016-01-27 MED ORDER — ENOXAPARIN SODIUM 60 MG/0.6ML ~~LOC~~ SOLN
60.0000 mg | Freq: Once | SUBCUTANEOUS | Status: AC
  Administered 2016-01-27: 60 mg via SUBCUTANEOUS
  Filled 2016-01-27: qty 0.6

## 2016-01-27 NOTE — Progress Notes (Signed)
Speech Language Pathology Daily Session Notes  Patient Details  Name: Bonnie Salazar MRN: [id number] Date of Birth: 04/22/1941  Today's Date: 01/27/2016  Session 1 SLP Individual Time: 0900-0945 SLP Individual Time Calculation (min): 45 min Session 2 SLP Individual Time: 1300-1330 SLP Individual Time Calculation (min): 30 min  Short Term Goals: Week 2: SLP Short Term Goal 1 (Week 2): Pt will consume current diet with mod I use of swallowing precautions and minimal overt s/s of aspiration or fatigue over 3 consecutive sessions.  SLP Short Term Goal 2 (Week 2): Pt will consume trials of regular textures with mod I use of swallowing precautions withou overt s/s of aspiration or fatigue to demonstrate readiness for  advancement.   SLP Short Term Goal 3 (Week 2): Pt will utilize external aids to recall new, semi-complex information Mod I SLP Short Term Goal 4 (Week 2): Pt will solve complex problem related to home management tasks with Supervision level verbal cues  SLP Short Term Goal 5 (Week 2): Pt will utilize word finding strategies during verbal expression of complex ideas Mod I  SLP Short Term Goal 5 - Progress (Week 2): Partly met  Skilled Therapeutic Interventions: Session 1 Skilled treatment session focused on addressing cognition goals.  Patient required increased wait time to recall 4/5 current medication functions.  SLP facilitated session by providing Min verbal cues to recall last medication and Max assist to recall names.  SLP educated patient on recommended strategies for medication management for discharge.  Patient independently requested assist to the bathroom and directed SLP.  SLP provided Min physical assist to and from the bathroom.  Continue with current plan of care.    Session 2 Upon SLP arrival patient and husband reported that she had just recently finished lunch with no reports of fatigue with mastication of regular textures.  Skilled treatment session focused on  addressing cognition goals. SLP facilitated session by providing Max assist choice of two to problem solve accurately loading and medication box.  Cues were quickly faded to Min verbal assist due to good carryover following initial instruction and initiation of task.  Continue with current plan of care.    Function:  Eating Eating   Modified Consistency Diet: No Eating Assist Level: More than reasonable amount of time   Eating Set Up Assist For: Opening containers;Cutting food       Cognition Comprehension Comprehension assist level: Follows basic conversation/direction with extra time/assistive device  Expression   Expression assist level: Expresses basic needs/ideas: With no assist  Social Interaction Social Interaction assist level: Interacts appropriately 90% of the time - Needs monitoring or encouragement for participation or interaction.  Problem Solving Problem solving assist level: Solves basic 90% of the time/requires cueing < 10% of the time  Memory Memory assist level: Recognizes or recalls 75 - 89% of the time/requires cueing 10 - 24% of the time    Pain Pain Assessment Pain Assessment: No/denies pain x2  Therapy/Group: Individual Therapy x2  Melissa Bowie, M.A., CCC-SLP [phone]  BOWIE,MELISSA 01/27/2016, 12:35 PM   

## 2016-01-27 NOTE — Progress Notes (Signed)
[place] PHYSICAL MEDICINE & REHABILITATION     PROGRESS NOTE  Subjective/Complaints:  Had a better night. More rest with IV out. No abdominal pain, distention   ROS: Denies CP, SOB, N/V/D.  Objective: Vital Signs: Blood pressure 132/60, pulse 86, temperature 98.1 F (36.7 C), temperature source Oral, resp. rate 18, height 5\' 7"  (1.702 m), weight 60.374 kg (133 lb 1.6 oz), SpO2 94 %. No results found.  Recent Labs  01/26/16 0743 01/27/16 0514  WBC 7.6 7.1  HGB 11.1* 10.3*  HCT 35.8* 33.4*  PLT 325 335    Recent Labs  01/27/16 0514  NA 137  K 3.7  CL 104  GLUCOSE 105*  BUN 5*  CREATININE 0.56  CALCIUM 9.2   CBG (last 3)  No results for input(s): GLUCAP in the last 72 hours.  Wt Readings from Last 3 Encounters:  01/27/16 60.374 kg (133 lb 1.6 oz)  01/16/16 63.4 kg (139 lb 12.4 oz)    Physical Exam:  BP 132/60 mmHg  Pulse 86  Temp(Src) 98.1 F (36.7 C) (Oral)  Resp 18  Ht 5\' 7"  (1.702 m)  Wt 60.374 kg (133 lb 1.6 oz)  BMI 20.84 kg/m2  SpO2 94% Constitutional: She appears well-developed and well-nourished. NAD.  HENT: Normocephalic and atraumatic.  Eyes: Conjunctivae and EOM are normal.  Cardiovascular: Normal rate and regular rhythm.  Respiratory: Effort normal and breath sounds normal. No stridor. No respiratory distress. She has no wheezes.  GI: + BS. She exhibits distension. There is no tenderness. Belly a little firm Musculoskeletal: She exhibits edema and tenderness.  Edema LLE 1+.  Neurological: She is alert and oriented.  Able to follow commands without difficulty.  Motor: B/l UE: 4+/5 proximal to distal RLE: Hip flexion 4/5, knee extension 4/5, ankle dorsi/plantar flexion 5/5 LLE: Hip flexion 3/5, knee extension 3/5, ankle dorsi/plantar flexion 5/5 Skin: Skin is warm and dry.  Left anterior hip incision healed  Psychiatric: affect more dynamic. Cognition and memory are normal.   Assessment/Plan: 1. Functional deficits secondary to  debility after left hip fracture complicated by arterial bleed, hemorrhagic shock, bilateral PE, PEA, Atrial thrombus, GIB. medical issues. which require 3+ hours per day of interdisciplinary therapy in a comprehensive inpatient rehab setting. Physiatrist is providing close team supervision and 24 hour management of active medical problems listed below. Physiatrist and rehab team continue to assess barriers to discharge/monitor patient progress toward functional and medical goals.  Function:  Bathing Bathing position   Position: Wheelchair/chair at sink  Bathing parts Body parts bathed by patient: Right arm, Left arm, Chest, Abdomen, Right upper leg, Left upper leg, Right lower leg, Left lower leg Body parts bathed by helper: Back  Bathing assist Assist Level: Touching or steadying assistance(Pt > 75%) (LH sponge)      Upper Body Dressing/Undressing Upper body dressing Upper body dressing/undressing activity did not occur: Refused What is the patient wearing?: Pull over shirt/dress     Pull over shirt/dress - Perfomed by patient: Thread/unthread right sleeve, Put head through opening, Pull shirt over trunk, Thread/unthread left sleeve Pull over shirt/dress - Perfomed by helper: Thread/unthread left sleeve        Upper body assist Assist Level: Supervision or verbal cues, Set up   Set up : To obtain clothing/put away  Lower Body Dressing/Undressing Lower body dressing   What is the patient wearing?: Pants, Non-skid slipper socks     Pants- Performed by patient: Thread/unthread right pants leg, Thread/unthread left pants leg, Pull pants  up/down Pants- Performed by helper: Pull pants up/down Non-skid slipper socks- Performed by patient: Don/doff right sock, Don/doff left sock Non-skid slipper socks- Performed by helper: Don/doff right sock, Don/doff left sock               TED Hose - Performed by helper: Don/doff right TED hose, Don/doff left TED hose  Lower body assist  Assist for lower body dressing: Touching or steadying assistance (Pt > 75%) (Reacher, sock aid)      Toileting Toileting Toileting activity did not occur: No continent bowel/bladder event Toileting steps completed by patient: Adjust clothing prior to toileting Toileting steps completed by helper: Adjust clothing prior to toileting, Performs perineal hygiene, Adjust clothing after toileting Toileting Assistive Devices: Other (comment)  Toileting assist Assist level: Supervision or verbal cues   Transfers Chair/bed transfer Chair/bed transfer activity did not occur: Refused Chair/bed transfer method: Ambulatory Chair/bed transfer assist level: Touching or steadying assistance (Pt > 75%) Chair/bed transfer assistive device: Armrests, Walker     Locomotion Ambulation Ambulation activity did not occur: Refused   Max distance: 5' Assist level: Touching or steadying assistance (Pt > 75%)   Wheelchair   Type: Manual Max wheelchair distance: 120' Assist Level: Supervision or verbal cues  Cognition Comprehension Comprehension assist level: Follows complex conversation/direction with extra time/assistive device  Expression Expression assist level: Expresses complex ideas: With extra time/assistive device  Social Interaction Social Interaction assist level: Interacts appropriately 90% of the time - Needs monitoring or encouragement for participation or interaction.  Problem Solving Problem solving assist level: Solves complex 90% of the time/cues < 10% of the time  Memory Memory assist level: Recognizes or recalls 90% of the time/requires cueing < 10% of the time    Medical Problem List and Plan: 1. Abnormality of gait, weakness, low endurance secondary to debility after left hip fracture complicated by arterial bleed, hemorrhagic shock, bilateral PE, PEA, Atrial thrombus, GIB. medical issues.  -Cont CIR   -team conf today 2. Bilateral PE/DVT Prophylaxis/Anticoagulation:   SCDs d/ced  due to clots  IVC filter in place.   Hx of GIB.   LE U/S showing extensive b/l DVTs  -appreciate Heme recs  -hgb stable  -lovenox initiated yesterday 3. Pain Management: Will schedule tylenol. Tramadol added for prn use.  4. Mood: Showing activation and humor. LCSW to follow for evaluation and support.  5. Neuropsych: This patient is capable of making decisions on her own behalf. 6. Skin/Wound Care: Routine pressure relief measures. Monitor incision daily for healing.  7. Fluids/Electrolytes/Nutrition: Monitor I/O.   BMP within acceptable range on today--personally reviewed  Advanced to dysphagia 3, thin diet, will cont to advance as tolerated 8. RA thrombus: Resolved post thrombolysis  Repeat Echo ordered per Cardiology, reviewed, no thrombus, slightly improved 9. PAF: In NSR on Cardizem, cardiology consulted and diltiazem d/ced, per pt preference.    Will titrate metoprolol as needed, stable at present.    Monitor HR bid.  10 GIB:H/H stable. Continue PPI.   Will continue to monitor for recurrent melena or hematochezia.  -exam benign 11. ABLA:   Has been treated with multiple units PRBC post arterial hemorrhage/GIB.   Hb 10.3. Holding fairly stable---continue daily cbc 12. Dysphagia: Tolerating diet advancement. Po intake improving 13. Chronic low back pain: Lumbar films reviewed, 7/7 stable.  Added kpad for local measures 14. Constipation: Resolved    15. UTI  Pseudomonas--60k--cipro started on 7/15 16. Hypokalemia  K+ 3.7 today  Continue daily kdur   LOS (  Days) 11 A FACE TO FACE EVALUATION WAS PERFORMED  SWARTZ,ZACHARY T 01/27/2016 8:50 AM

## 2016-01-27 NOTE — Progress Notes (Signed)
Physical Therapy Session Note  Patient Details  Name: Bonnie Salazar MRN: [id number] Date of Birth: 1941/02/26  Today's Date: 01/27/2016 PT Individual Time: 0805-0900 PT Individual Time Calculation (min): 55 min   Short Term Goals: Week 2:  PT Short Term Goal 1 (Week 2): Pt will be able to perform basic transfers consistently with min assist and min verbal cues  PT Short Term Goal 2 (Week 2): Pt will be able to progress gait up to 50' with min assist PT Short Term Goal 3 (Week 2): Pt will be able to negotiate 4 stairs with 2 rails for LE strengthening and mod assist  Skilled Therapeutic Interventions/Progress Updates:   Session focused on functional bed mobility re-training, transfers, gait training, balance, and standing therex. Pt required extra time and mod verbal cues for technique for bed mobility but able to perform with supervision with flat bed and no rails. Required several attempts and min verbal cues for initial transfer OOB for sit <> stand with min assist and other transfers during session with close supervision to steadying assist. Pt required close supervision to steadying assist for gait with cues for posture to and from therapy session. Reviewed standing exercises including hip abduction, extension, and hamstring curls x 10 reps each with min assist for balance. End of session set up in w/c with all needs in reach and husband at bedside.  Therapy Documentation Precautions:  Precautions Precautions: Fall Restrictions Weight Bearing Restrictions: No LLE Weight Bearing: Weight bearing as tolerated General: PT Amount of Missed Time (min): 5 Minutes PT Missed Treatment Reason: Nursing care  Pain: Premedicated for LLE pain.    See Function Navigator for Current Functional Status.   Therapy/Group: Individual Therapy  [name], PT, DPT  01/27/2016, 9:28 AM

## 2016-01-27 NOTE — Progress Notes (Signed)
Occupational Therapy Session Note  Patient Details  Name: Bonnie Salazar MRN: [id number] Date of Birth: 28-Nov-1940  Today's Date: 01/27/2016 OT Individual Time: 1000-1100 OT Individual Time Calculation (min): 60 min    Short Term Goals: Week 2:  OT Short Term Goal 1 (Week 2): Pt will perform toileting task with min A OT Short Term Goal 2 (Week 2): Pt will dress LB using AE without need for VCs OT Short Term Goal 3 (Week 2): Pt will complete 1 grooming task standing at sink in order to increase functional activity tolerance  Skilled Therapeutic Interventions/Progress Updates:    Pt seen for OT ADL bathing/dressing session. Pt sitting up in w/c upon arrival, husband present however did not stay for session. She completed sit <> stands at [facility] throughout session initially requiring min A however, progressing to supervision. She ambulated throughout room using RW with supervision. She bathed seated on tub bench requiring VCs throughout for initiation and sequencing of basic bathing tasks. She dressed seated on BSC, recalling use of AE and able to dress with supervision while utilizing sock aid and reacher.  She ambulated to sink and completed grooming tasks standing at sink, demonstrating much improved functional activity tolerance. Pt returned to w/c at end of session, left with all needs in reach. She voiced discomfort in R CMC joint, iced applied for comfort and RN already aware.   Therapy Documentation Precautions:  Precautions Precautions: Fall Restrictions Weight Bearing Restrictions: No LLE Weight Bearing: Weight bearing as tolerated  See Function Navigator for Current Functional Status.   Therapy/Group: Individual Therapy  Lewis, Summers Buendia C 01/27/2016, 6:42 AM

## 2016-01-27 NOTE — Consult Note (Signed)
  INITIAL DIAGNOSTIC EVALUATION - CONFIDENTIAL  Inpatient Rehabilitation   MEDICAL NECESSITY:  Bonnie Salazar was seen on the [facility] for an initial diagnostic evaluation.    Records indicate that Bonnie Salazar is a "75 y.o. female admitted on 12/25/15 after a fall with subsequent left femoral neck fracture. She underwent left anterior hip arthroplasty by Dr. Fredonia Highland" with multiple postoperative complications (see history for details).   During today's visit, Bonnie Salazar was accompanied by her Husband. He assisted with the history. Cognitively, she denied experiencing any major issues but said that her "mind is cloudy" secondary to medication side effects and poor sleep. Her husband agreed. She mentioned that she has always been susceptible to medication side effects.   From an emotional standpoint, Bonnie Salazar denied ever being diagnosed with a mental health disorder, though ~20 years ago she did see a counselor secondary to certain social stressors. She otherwise denied experiencing any prolonged periods of depression or anxiety throughout her life. However, she has been suffering from depression of late because she has been hospitalized so long (>30 days) and because she does not feel she is making much progress in therapy. These feelings are present most days and last most of the day. She also does not have a good understanding of recovery prognosis and where she is expected to be when she discharges home and this is stressful. Suicidal/homicidal ideation, plan or intent was denied. No manic or hypomanic episodes were reported. The patient denied ever experiencing any auditory/visual hallucinations. No major behavioral or personality changes were endorsed.   Bonnie Salazar feels that she has had a lot of setbacks in therapy because of multiple medical complications. This has purportedly been the biggest barrier to therapy. She has been mostly  satisfied with the rehabilitation staff. She presently lives with her husband and the plan is to return to this situation. He is her biggest supporter.   PROCEDURES: [1 unit 90791] Diagnostic clinical interview  Review of available records   IMPRESSION: Overall, Bonnie Salazar denied experiencing any major cognitive issues, though certain medication side effects reportedly make her thinking suboptimal. Regardless, no overt issues were observed. Emotionally, she is suffering from significant depressive symptoms secondary to her present medical circumstances. However, she is not amenable to treatment with psychotropic medications and she was not particularly interested in counseling. I suggested that she and her husband monitor her mood for worsening and to inform the staff if any changes occur so that a plan can be formulated at that time. Otherwise, it is my hope that most of her symptoms will resolve naturally once she discharges home. I will informally check-in with her next week, though she does not need to be placed on my schedule unless requested by the patient or staff.   DIAGNOSIS:  Adjustment Disorder with depressed mood     Rutha Bouchard, Psy.D., ABN Board-Certified Clinical Neuropsychologist

## 2016-01-28 ENCOUNTER — Inpatient Hospital Stay (HOSPITAL_COMMUNITY): Payer: Medicare HMO | Admitting: Physical Therapy

## 2016-01-28 ENCOUNTER — Inpatient Hospital Stay (HOSPITAL_COMMUNITY): Payer: Medicare HMO | Admitting: Occupational Therapy

## 2016-01-28 ENCOUNTER — Inpatient Hospital Stay (HOSPITAL_COMMUNITY): Payer: Medicare HMO | Admitting: Speech Pathology

## 2016-01-28 LAB — HEMOGLOBIN AND HEMATOCRIT, BLOOD
HEMATOCRIT: 34 % — AB (ref 36.0–46.0)
HEMOGLOBIN: 10.7 g/dL — AB (ref 12.0–15.0)

## 2016-01-28 MED ORDER — METHOCARBAMOL 500 MG PO TABS: 250.0000 mg | ORAL_TABLET | Freq: Four times a day (QID) | ORAL | Status: DC | PRN

## 2016-01-28 MED ORDER — METHOCARBAMOL 500 MG PO TABS
250.0000 mg | ORAL_TABLET | Freq: Four times a day (QID) | ORAL | Status: DC | PRN
  Administered 2016-01-28: 500 mg via ORAL
  Filled 2016-01-28: qty 1

## 2016-01-28 NOTE — Plan of Care (Signed)
Problem: RH PAIN MANAGEMENT Goal: RH STG PAIN MANAGED AT OR BELOW PT'S PAIN GOAL Less than 3,on 1 to 3 scale.  Outcome: Not Progressing Consistently rating pain at 7+/10; new onset pain in L knee. Agreed to try tramadol.

## 2016-01-28 NOTE — Progress Notes (Signed)
Speech Language Pathology Daily Session Note  Patient Details  Name: Bonnie Salazar MRN: [id number] Date of Birth: 1940-07-17  Today's Date: 01/28/2016 SLP Individual Time: 0900-1000 SLP Individual Time Calculation (min): 60 min  Short Term Goals: Week 2: SLP Short Term Goal 1 (Week 2): Pt will consume current diet with mod I use of swallowing precautions and minimal overt s/s of aspiration or fatigue over 3 consecutive sessions.  SLP Short Term Goal 2 (Week 2): Pt will consume trials of regular textures with mod I use of swallowing precautions withou overt s/s of aspiration or fatigue to demonstrate readiness for  advancement.   SLP Short Term Goal 3 (Week 2): Pt will utilize external aids to recall new, semi-complex information Mod I SLP Short Term Goal 4 (Week 2): Pt will solve complex problem related to home management tasks with Supervision level verbal cues  SLP Short Term Goal 5 (Week 2): Pt will utilize word finding strategies during verbal expression of complex ideas Mod I  SLP Short Term Goal 5 - Progress (Week 2): Partly met  Skilled Therapeutic Interventions: Skilled treatment session focused on addressing dysphagia and cognition goals. SLP facilitated session by providing skilled observation of thin liquids via straw sips with no reflexive s/s of aspiration and Mod I carryover of volitional cough following sips.  SLP also facilitated session with medication management task with Mod cues to problem solve where task was stopped yesterday.  Patient then required Min faded to Supervision level verbal cues to carryover accurate completion of task.  Recommend next session target money management.       Function:  Eating Eating   Modified Consistency Diet: No Eating Assist Level: More than reasonable amount of time           Cognition Comprehension Comprehension assist level: Follows basic conversation/direction with extra time/assistive device  Expression   Expression  assist level: Expresses basic needs/ideas: With no assist  Social Interaction Social Interaction assist level: Interacts appropriately 90% of the time - Needs monitoring or encouragement for participation or interaction.  Problem Solving Problem solving assist level: Solves basic 90% of the time/requires cueing < 10% of the time  Memory Memory assist level: Recognizes or recalls 75 - 89% of the time/requires cueing 10 - 24% of the time    Pain Pain Assessment Pain Assessment: No/denies pain  Therapy/Group: Individual Therapy  Carmelia Roller., [place] [phone]  [place] 01/28/2016, 12:25 PM

## 2016-01-28 NOTE — Progress Notes (Signed)
Nutrition Follow-up  DOCUMENTATION CODES:   Not applicable  INTERVENTION:  Continue Ensure Enlive po BID, each supplement provides 350 kcal and 20 grams of protein  Encourage adequate PO intake.   NUTRITION DIAGNOSIS:   Inadequate oral intake related to dysphagia as evidenced by per patient/family report; improved  GOAL:   Patient will meet greater than or equal to 90% of their needs; met  MONITOR:   PO intake, Supplement acceptance, Weight trends, Labs, I & O's  REASON FOR ASSESSMENT:   Malnutrition Screening Tool    ASSESSMENT:   75 y.o. female admitted on 12/25/15 after a fall with subsequent left femoral neck fracture. She underwent left anterior hip arthroplasty by Dr. Fredonia Highland. Post op course complicated by abdominal distension with hypotension due to bleeding from distal iliac artery. Presents with Debility after left hip fracture complicated by arterial bleed, hemorrhagic shock, bilateral PE, PEA, Atrial thrombus, GIB.  Meal completion has been 100%. Pt has been consuming her Ensure Shakes. Will continue with current orders. Labs and medications reviewed.   Diet Order:  Diet regular Room service appropriate?: Yes; Fluid consistency:: Thin  Skin:  Wound (see comment) (Wound on coccyx, incision on L hip)  Last BM:  7/17  Height:   Ht Readings from Last 1 Encounters:  01/16/16 '5\' 7"'$  (1.702 m)    Weight:   Wt Readings from Last 1 Encounters:  01/28/16 129 lb 6.6 oz (58.7 kg)    Ideal Body Weight:  61.36 kg  BMI:  Body mass index is 20.26 kg/(m^2).  Estimated Nutritional Needs:   Kcal:  1600-1800  Protein:  75-85 grams  Fluid:  1.6 - 1.8 L/day  EDUCATION NEEDS:   No education needs identified at this time  Corrin Parker, MS, RD, LDN Pager # [phone] After hours/ weekend pager # [phone]

## 2016-01-28 NOTE — Progress Notes (Signed)
[place] PHYSICAL MEDICINE & REHABILITATION     PROGRESS NOTE  Subjective/Complaints:  Left knee sore. More bothersome overnight. Was not an issue during day. Able to sleep.   ROS: Denies CP, SOB, N/V/D.  Objective: Vital Signs: Blood pressure 122/54, pulse 104, temperature 97.8 F (36.6 C), temperature source Oral, resp. rate 20, height 5\' 7"  (1.702 m), weight 58.7 kg (129 lb 6.6 oz), SpO2 96 %. No results found.  Recent Labs  01/26/16 0743 01/27/16 0514 01/28/16 0632  WBC 7.6 7.1  --   HGB 11.1* 10.3* 10.7*  HCT 35.8* 33.4* 34.0*  PLT 325 335  --     Recent Labs  01/27/16 0514  NA 137  K 3.7  CL 104  GLUCOSE 105*  BUN 5*  CREATININE 0.56  CALCIUM 9.2   CBG (last 3)  No results for input(s): GLUCAP in the last 72 hours.  Wt Readings from Last 3 Encounters:  01/28/16 58.7 kg (129 lb 6.6 oz)  01/16/16 63.4 kg (139 lb 12.4 oz)    Physical Exam:  BP 122/54 mmHg  Pulse 104  Temp(Src) 97.8 F (36.6 C) (Oral)  Resp 20  Ht 5\' 7"  (1.702 m)  Wt 58.7 kg (129 lb 6.6 oz)  BMI 20.26 kg/m2  SpO2 96% Constitutional: She appears well-developed and well-nourished. NAD.  HENT: Normocephalic and atraumatic.  Eyes: Conjunctivae and EOM are normal.  Cardiovascular: Normal rate and regular rhythm.  Respiratory: Effort normal and breath sounds normal. No stridor. No respiratory distress. She has no wheezes.  GI: + BS. She exhibits distension. There is no tenderness. Belly a little firm Musculoskeletal: She exhibits edema and tenderness. Mild tenderness in left popliteal fossa  Edema LLE 1+--decreasing Neurological: She is alert and oriented.  Able to follow commands without difficulty.  Motor: B/l UE: 4+/5 proximal to distal RLE: Hip flexion 4/5, knee extension 4/5, ankle dorsi/plantar flexion 5/5 LLE: Hip flexion 3/5, knee extension 3/5, ankle dorsi/plantar flexion 5/5 Skin: Skin is warm and dry.  Left anterior hip incision healed  Psychiatric: affect more dynamic.  Cognition and memory are normal.   Assessment/Plan: 1. Functional deficits secondary to debility after left hip fracture complicated by arterial bleed, hemorrhagic shock, bilateral PE, PEA, Atrial thrombus, GIB. medical issues. which require 3+ hours per day of interdisciplinary therapy in a comprehensive inpatient rehab setting. Physiatrist is providing close team supervision and 24 hour management of active medical problems listed below. Physiatrist and rehab team continue to assess barriers to discharge/monitor patient progress toward functional and medical goals.  Function:  Bathing Bathing position   Position: Shower  Bathing parts Body parts bathed by patient: Right arm, Left arm, Chest, Abdomen, Right upper leg, Left upper leg, Right lower leg, Left lower leg, Front perineal area, Buttocks, Back Body parts bathed by helper: Back  Bathing assist Assist Level: Supervision or verbal cues      Upper Body Dressing/Undressing Upper body dressing Upper body dressing/undressing activity did not occur: Refused What is the patient wearing?: Pull over shirt/dress     Pull over shirt/dress - Perfomed by patient: Thread/unthread right sleeve, Put head through opening, Pull shirt over trunk, Thread/unthread left sleeve Pull over shirt/dress - Perfomed by helper: Thread/unthread left sleeve        Upper body assist Assist Level: Set up   Set up : To obtain clothing/put away  Lower Body Dressing/Undressing Lower body dressing   What is the patient wearing?: Pants, Non-skid slipper socks     Pants- Performed  by patient: Thread/unthread right pants leg, Thread/unthread left pants leg, Pull pants up/down Pants- Performed by helper: Pull pants up/down Non-skid slipper socks- Performed by patient: Don/doff right sock, Don/doff left sock (Using sock aid) Non-skid slipper socks- Performed by helper: Don/doff right sock, Don/doff left sock               TED Hose - Performed by helper:  Don/doff right TED hose, Don/doff left TED hose  Lower body assist Assist for lower body dressing: Supervision or verbal cues      Toileting Toileting Toileting activity did not occur: No continent bowel/bladder event Toileting steps completed by patient: Adjust clothing prior to toileting Toileting steps completed by helper: Adjust clothing prior to toileting, Performs perineal hygiene, Adjust clothing after toileting Toileting Assistive Devices: Other (comment)  Toileting assist Assist level: Supervision or verbal cues   Transfers Chair/bed transfer Chair/bed transfer activity did not occur: Refused Chair/bed transfer method: Ambulatory Chair/bed transfer assist level: Touching or steadying assistance (Pt > 75%) Chair/bed transfer assistive device: Armrests, Walker     Locomotion Ambulation Ambulation activity did not occur: Refused   Max distance: 34' Assist level: Touching or steadying assistance (Pt > 75%)   Wheelchair   Type: Manual Max wheelchair distance: 120' Assist Level: Supervision or verbal cues  Cognition Comprehension Comprehension assist level: Follows complex conversation/direction with extra time/assistive device  Expression Expression assist level: Expresses complex ideas: With no assist  Social Interaction Social Interaction assist level: Interacts appropriately 90% of the time - Needs monitoring or encouragement for participation or interaction.  Problem Solving Problem solving assist level: Solves complex 90% of the time/cues < 10% of the time  Memory Memory assist level: Recognizes or recalls 75 - 89% of the time/requires cueing 10 - 24% of the time    Medical Problem List and Plan: 1. Abnormality of gait, weakness, low endurance secondary to debility after left hip fracture complicated by arterial bleed, hemorrhagic shock, bilateral PE, PEA, Atrial thrombus, GIB. medical issues.  -Cont CIR   -ELOS 7.26 2. Bilateral PE/DVT  Prophylaxis/Anticoagulation:  IVC filter in place.   Hx of GIB.   LE U/S showing extensive b/l DVTs  -appreciate Heme recs  -hgb stable  -lovenox initiated Monday 3. Pain Management: scheduled tylenol. Tramadol prn  -ice prn to left knee--suspect some of discomfort is from exercise/thrombus  4. Mood: Showing activation and humor. LCSW to follow for evaluation and support.  5. Neuropsych: This patient is capable of making decisions on her own behalf. 6. Skin/Wound Care: Routine pressure relief measures. Monitor incision daily for healing.  7. Fluids/Electrolytes/Nutrition: Monitor I/O.   BMP within acceptable range on today--personally reviewed  Advanced to dysphagia 3, thin diet, will cont to advance as tolerated 8. RA thrombus: Resolved post thrombolysis  Repeat Echo ordered per Cardiology, reviewed, no thrombus, slightly improved 9. PAF: In NSR on Cardizem, cardiology consulted and diltiazem d/ced, per pt preference.    Will titrate metoprolol as needed, stable at present.    Monitor HR bid.  10 GIB:H/H stable. Continue PPI.   Will continue to monitor for recurrent melena or hematochezia.  -exam benign 11. ABLA:   Has been treated with multiple units PRBC post arterial hemorrhage/GIB.   Hb 10.7. Holding fairly stable, re-check Friday 12. Dysphagia: Tolerating diet advancement. Po intake improving 13. Chronic low back pain: Lumbar films reviewed, 7/7 stable.  Added kpad for local measures 14. Constipation: Resolved    15. UTI  Pseudomonas--60k--cipro started on 7/15 16. Hypokalemia  K+ 3.7 most recently  Continue daily kdur   LOS (Days) 12 A FACE TO FACE EVALUATION WAS PERFORMED  SWARTZ,ZACHARY T 01/28/2016 9:25 AM

## 2016-01-28 NOTE — Progress Notes (Signed)
Left knee hurting. Pam Love notified, encouraged patient to try tramadol for pain. Tramadol given, explained increased activity can cause soreness and may need stronger pain control than tylenol. Will continue to monitor.

## 2016-01-28 NOTE — Progress Notes (Signed)
Physical Therapy Session Note  Patient Details  Name: Bonnie Salazar MRN: [id number] Date of Birth: 1941-04-25  Today's Date: 01/28/2016 PT Individual Time: 1310-1404 PT Individual Time Calculation (min): 54 min   Short Term Goals: Week 2:  PT Short Term Goal 1 (Week 2): Pt will be able to perform basic transfers consistently with min assist and min verbal cues  PT Short Term Goal 2 (Week 2): Pt will be able to progress gait up to 50' with min assist PT Short Term Goal 3 (Week 2): Pt will be able to negotiate 4 stairs with 2 rails for LE strengthening and mod assist  Skilled Therapeutic Interventions/Progress Updates:    Pt received in w/c & agreeable to PT; pt noted no pain at rest and 10/10 with movement but RN aware of this and pt premedicated. Pt propelled w/c room>gym x 100 ft with BUE & supervision for cardiovascular endurance training. In gym pt agreeable to gait training x 25 ft + 40 ft with RW & steady A. Pt with significantly decreased gait speed & step length BLE. Pt able to complete sit<>stand transfers with steady A & proper hand placement. Encouraged pt to attempt stair negotiation but pt became tearful regarding pain setting her back in therapy; therapist provided emotional support & listening. Pt performed LLE long arc quads for ROM & pt unable to demonstrate full knee extension 2/2 pain and weakness. Pt propelled w/c back to room with BUE & supervision. Pt left in room with LLE elevated on leg rest & hot pack applied; pt's husband present & all needs within reach.   Therapy Documentation Precautions:  Precautions Precautions: Fall Restrictions Weight Bearing Restrictions: Yes LLE Weight Bearing: Weight bearing as tolerated    See Function Navigator for Current Functional Status.   Therapy/Group: Individual Therapy  Waunita Schooner 01/28/2016, 2:20 PM

## 2016-01-28 NOTE — Progress Notes (Signed)
Occupational Therapy Session Note  Patient Details  Name: CLEONE HULICK MRN: [id number] Date of Birth: 07-17-1940   Today's Date: 01/28/2016 OT Individual Time:  6015- 6153   OT Treatment Time: 60 min   Short Term Goals: Week 2:  OT Short Term Goal 1 (Week 2): Pt will perform toileting task with min A OT Short Term Goal 2 (Week 2): Pt will dress LB using AE without need for VCs OT Short Term Goal 3 (Week 2): Pt will complete 1 grooming task standing at sink in order to increase functional activity tolerance  Skilled Therapeutic Interventions/Progress Updates:  Pt seen for skilled OT session focusing on self care, activity tolerance and pain management. Pt sitting in w/c upon arrival and agreeable to tx session. Pt complained of 10/10 pain in L knee. RN notified and made PA aware. PA granted permission to cont with therapy session. Pt completed UB bathing/dressing at sitting at sink with VC for sequencing. Pt required increased assist and reacher to thread pants needing min A to stand at RW to pull up pants. Pt ambulated approximately 10 feet with RW to w/c to self propel w/c using BUE to therapy gym. Pt transferred to mat using RW and min A. Pt laid supine on mat needing assist to bring legs up onto mat. Pt unable to tolerate full LLE  due to severe L knee pain. Therefore OT positioned pillow and icepack under knee and had pt perform 2x10 reps of UB exercises using dowel rod in supine. Pt continued to experience severe pain in L knee. Therefore OT provided stretch to pt hamstring and calf muscles with pt verbalizing severe pain. Pt attempted bending knee to roll EOM, but required manual facilitation due to pain, thus pt transferred EOM needing assist walking legs over mat. Pt stood with min A at RW and pivoted to w/c. OT propelled pt to room and left with all needs met.   Therapy Documentation Precautions:  Precautions Precautions: Fall Restrictions Weight Bearing Restrictions: Yes LLE  Weight Bearing: Weight bearing as tolerated  See Function Navigator for Current Functional Status.   Therapy/Group: Individual Therapy  Matilde Bash 01/28/2016, 12:27 PM

## 2016-01-28 NOTE — Patient Care Conference (Signed)
Inpatient RehabilitationTeam Conference and Plan of Care Update Date: 01/27/2016   Time: 2:05 PM    Patient Name: Bonnie Salazar      Medical Record Number: [id number]  Date of Birth: [birth year] Sex: Female         Room/Bed: 4M01C/4M01C-01 Payor Info: Payor: [employer] MEDICARE / Plan: [employer] MEDICARE HMO/PPO / Product Type: *No Product type* /    Admitting Diagnosis: Lf Hip Fx  Admit Date/Time:  01/16/2016  2:30 PM Admission Comments: No comment available   Primary Diagnosis:  <principal problem not specified> Principal Problem: <principal problem not specified>  Patient Active Problem List   Diagnosis Date Noted  . Urinary retention   . Acute bilateral deep vein thrombosis (DVT) of femoral veins ([place])   . Peritoneal bleeding   . Edema   . Acute lower UTI   . Slow transit constipation   . Debility 01/16/2016  . Pain aggravated by sitting   . Abnormality of gait   . S/p left hip fracture   . Pulmonary embolism without acute cor pulmonale ([place])   . Post-operative pain   . Depression   . Bleeding gastrointestinal   . PAF (paroxysmal atrial fibrillation) ([place])   . Acute blood loss anemia   . Dysphagia   . Chronic low back pain   . Female pelvic hematoma   . Hemorrhage   . Blood per rectum   . Pulmonary embolus, left ([place])   . Pulmonary embolus, right ([place])   . Acute respiratory failure with hypoxia ([place])   . PEA (Pulseless electrical activity) ([place])   . Right atrial thrombus ([place])   . Arterial hemorrhage   . Hypovolemic shock ([place])   . Hypokalemia   . Hypernatremia   . Gastrointestinal hemorrhage with melena   . Acute pulmonary embolus ([place])   . Atrial mass   . Bleeding   . Acute pulmonary embolism ([place]) 12/28/2015  . Atrial thrombus ([place])   . Acute respiratory failure ([place])   . Hemorrhagic shock   . Cardiac arrest ([place])   . Fracture of femoral neck, left ([place]) 12/26/2015  . Hip fracture ([place]) 12/25/2015  . Leukocytosis 12/25/2015  . Dehydration 12/25/2015     Expected Discharge Date: Expected Discharge Date: 02/04/16  Team Members Present: Physician leading conference: Dr. Alger Simons Social Worker Present: Lennart Pall, LCSW Nurse Present: Dorien Chihuahua, RN PT Present: Canary Brim, PT OT Present: Napoleon Form, OT PPS Coordinator present : Daiva Nakayama, RN, CRRN     Current Status/Progress Goal Weekly Team Focus  Medical   now on lovenox for dvt. pain improving as well as strength. pushing po. hgb stable--following serially  improve activity tolerance,   intake, pain, hgb, rx dvt   Bowel/Bladder   Cont of bowel, incont of bladder   Maintain minimal assistance with toileting, patient will experience less incont bladder episodes.   nursing will continue bladder program    Swallow/Nutrition/ Hydration   regular and thin with intermittent supervision   least restrictive PO intake with Mod I   tolerance of recent diet upgrade and education    ADL's   Supervision-min A toilet transfers; min A using AE for LB bathing/ dressing tasks; set-up UB bathing/dressing  Supervision- min A overall  Activity tolerance; functional standing balance/ endurance; family training   Mobility   min assist overall  supervision to min assist overall  activity tolerance, strengthening, transfers, pain management, family education, stairs, gait,balance   Communication   Supervision-Mod I   Mod  I   education    Safety/Cognition/ Behavioral Observations  Supevision with verbak and Min assist with functional   Mod I  id and implementation of solutions to problems, use of memory aids    Pain   Continue to experience pain   Maintain pain level at zero to 3, or  as tolerable   Nursing staff will continue to administer pain regimen as ordered.    Skin   OTA incision, no noted signs or symptoms of infections   Remain free of infection, continue to use proper hand hygiene, and keep patient dry   Nursing staff will continue to assess skin, maintain dryness, and continue to  educate on hand hygiene       *See Care Plan and progress notes for long and short-term goals.  Barriers to Discharge: multiple medical, fatigue/weakness    Possible Resolutions to Barriers:  ongoing physical training, daily mgt of medical issues, family ed    Discharge Planning/Teaching Needs:  Plan to d/c home with husband able to provide 24/7 assistance.  TEaching has been ongoing with husband.   Team Discussion:  Monitor labs;  Needs close medical observation.  No longer receiving IVFs. Husband has been cleared to assist to toilet.  Intake improved with change to reg diet.  Poor problem solving and still requires max cueing.  ?Learned helplessness vs encephalopathy vs both.  Supervision goals overall and on track to meet.  Revisions to Treatment Plan:  None   Continued Need for Acute Rehabilitation Level of Care: The patient requires daily medical management by a physician with specialized training in physical medicine and rehabilitation for the following conditions: Daily direction of a multidisciplinary physical rehabilitation program to ensure safe treatment while eliciting the highest outcome that is of practical value to the patient.: Yes Daily medical management of patient stability for increased activity during participation in an intensive rehabilitation regime.: Yes Daily analysis of laboratory values and/or radiology reports with any subsequent need for medication adjustment of medical intervention for : Nutritional problems;Post surgical problems;Cardiac problems  Edu On 01/28/2016, 6:28 PM

## 2016-01-29 ENCOUNTER — Inpatient Hospital Stay (HOSPITAL_COMMUNITY): Payer: Medicare HMO

## 2016-01-29 ENCOUNTER — Inpatient Hospital Stay (HOSPITAL_COMMUNITY): Payer: Medicare HMO | Admitting: Speech Pathology

## 2016-01-29 ENCOUNTER — Inpatient Hospital Stay (HOSPITAL_COMMUNITY): Payer: Medicare HMO | Admitting: Occupational Therapy

## 2016-01-29 MED ORDER — ALPRAZOLAM 0.25 MG PO TABS: 0.5000 mg | ORAL_TABLET | Freq: Once | ORAL | Status: DC

## 2016-01-29 MED ORDER — METHOCARBAMOL 500 MG PO TABS
500.0000 mg | ORAL_TABLET | Freq: Four times a day (QID) | ORAL | Status: DC
  Administered 2016-01-29 – 2016-02-04 (×23): 500 mg via ORAL
  Filled 2016-01-29 (×23): qty 1

## 2016-01-29 NOTE — Progress Notes (Signed)
[facility] PHYSICAL MEDICINE & REHABILITATION     PROGRESS NOTE  Subjective/Complaints:  Left leg still sore, swollen. Bladder emptying better during the day.    ROS: Denies CP, SOB, N/V/D.  Objective: Vital Signs: Blood pressure 106/51, pulse 92, temperature 97.7 F (36.5 C), temperature source Oral, resp. rate 18, height 5\' 7"  (1.702 m), weight 61 kg (134 lb 7.7 oz), SpO2 95 %. No results found.  Recent Labs  01/27/16 0514 01/28/16 0632  WBC 7.1  --   HGB 10.3* 10.7*  HCT 33.4* 34.0*  PLT 335  --     Recent Labs  01/27/16 0514  NA 137  K 3.7  CL 104  GLUCOSE 105*  BUN 5*  CREATININE 0.56  CALCIUM 9.2   CBG (last 3)  No results for input(s): GLUCAP in the last 72 hours.  Wt Readings from Last 3 Encounters:  01/29/16 61 kg (134 lb 7.7 oz)  01/16/16 63.4 kg (139 lb 12.4 oz)    Physical Exam:  BP 106/51 mmHg  Pulse 92  Temp(Src) 97.7 F (36.5 C) (Oral)  Resp 18  Ht 5\' 7"  (1.702 m)  Wt 61 kg (134 lb 7.7 oz)  BMI 21.06 kg/m2  SpO2 95% Constitutional: She appears well-developed and well-nourished. NAD.  HENT: Normocephalic and atraumatic.  Eyes: Conjunctivae and EOM are normal.  Cardiovascular: Normal rate and regular rhythm.  Respiratory: Effort normal and breath sounds normal. No stridor. No respiratory distress. She has no wheezes.  GI: + BS. She exhibits distension. There is no tenderness. Belly a little firm Musculoskeletal: She exhibits edema and tenderness. Mild tenderness in left popliteal fossa  Edema LLE 1++ Neurological: She is alert and oriented.  Able to follow commands without difficulty.  Motor: B/l UE: 4+/5 proximal to distal RLE: Hip flexion 4/5, knee extension 4/5, ankle dorsi/plantar flexion 5/5 LLE: Hip flexion 3/5, knee extension 3/5, ankle dorsi/plantar flexion 5/5 Skin: Skin is warm and dry.  Left anterior hip incision healed  Psychiatric: affect more dynamic. Cognition and memory are normal.   Assessment/Plan: 1. Functional  deficits secondary to debility after left hip fracture complicated by arterial bleed, hemorrhagic shock, bilateral PE, PEA, Atrial thrombus, GIB. medical issues. which require 3+ hours per day of interdisciplinary therapy in a comprehensive inpatient rehab setting. Physiatrist is providing close team supervision and 24 hour management of active medical problems listed below. Physiatrist and rehab team continue to assess barriers to discharge/monitor patient progress toward functional and medical goals.  Function:  Bathing Bathing position   Position: Wheelchair/chair at sink  Bathing parts Body parts bathed by patient: Right arm, Left arm, Chest, Abdomen Body parts bathed by helper: Back  Bathing assist Assist Level: Supervision or verbal cues      Upper Body Dressing/Undressing Upper body dressing Upper body dressing/undressing activity did not occur: Refused What is the patient wearing?: Pull over shirt/dress     Pull over shirt/dress - Perfomed by patient: Thread/unthread right sleeve, Put head through opening, Pull shirt over trunk, Thread/unthread left sleeve Pull over shirt/dress - Perfomed by helper: Thread/unthread left sleeve        Upper body assist Assist Level: Set up   Set up : To obtain clothing/put away  Lower Body Dressing/Undressing Lower body dressing   What is the patient wearing?: Pants, Non-skid slipper socks     Pants- Performed by patient: Thread/unthread right pants leg, Thread/unthread left pants leg, Pull pants up/down Pants- Performed by helper: Pull pants up/down Non-skid slipper socks- Performed  by patient: Don/doff right sock, Don/doff left sock (Using sock aid) Non-skid slipper socks- Performed by helper: Don/doff right sock, Don/doff left sock               TED Hose - Performed by helper: Don/doff right TED hose, Don/doff left TED hose  Lower body assist Assist for lower body dressing: Touching or steadying assistance (Pt > 75%)       Toileting Toileting Toileting activity did not occur: No continent bowel/bladder event Toileting steps completed by patient: Adjust clothing prior to toileting, Performs perineal hygiene, Adjust clothing after toileting Toileting steps completed by helper: Adjust clothing prior to toileting, Performs perineal hygiene, Adjust clothing after toileting Toileting Assistive Devices: Grab bar or rail  Toileting assist Assist level: Supervision or verbal cues   Transfers Chair/bed transfer Chair/bed transfer activity did not occur: Refused Chair/bed transfer method: Ambulatory Chair/bed transfer assist level: Touching or steadying assistance (Pt > 75%) Chair/bed transfer assistive device: Armrests, Medical sales representative Ambulation activity did not occur: Refused   Max distance: 40 ft Assist level: Touching or steadying assistance (Pt > 75%)   Wheelchair   Type: Manual Max wheelchair distance: 100 ft Assist Level: Supervision or verbal cues  Cognition Comprehension Comprehension assist level: Follows basic conversation/direction with no assist  Expression Expression assist level: Expresses basic needs/ideas: With no assist  Social Interaction Social Interaction assist level: Interacts appropriately 90% of the time - Needs monitoring or encouragement for participation or interaction.  Problem Solving Problem solving assist level: Solves basic problems with no assist  Memory Memory assist level: Recognizes or recalls 90% of the time/requires cueing < 10% of the time    Medical Problem List and Plan: 1. Abnormality of gait, weakness, low endurance secondary to debility after left hip fracture complicated by arterial bleed, hemorrhagic shock, bilateral PE, PEA, Atrial thrombus, GIB. medical issues.  -Cont CIR   -ELOS 7.26 2. Bilateral PE/DVT Prophylaxis/Anticoagulation:  IVC filter in place.   Hx of GIB.   LE U/S showing extensive b/l DVTs  -appreciate Heme recs  -hgb  stable---recheck tomorrow  -lovenox initiated Monday 3. Pain Management: scheduled tylenol. Tramadol prn  -ice prn to left knee--suspect some of discomfort is from exercise/thrombus  4. Mood: Showing activation and humor. LCSW to follow for evaluation and support.  5. Neuropsych: This patient is capable of making decisions on her own behalf. 6. Skin/Wound Care: Routine pressure relief measures. Monitor incision daily for healing.  7. Fluids/Electrolytes/Nutrition: Monitor I/O.   BMP within acceptable range on today--personally reviewed  Advanced to dysphagia 3, thin diet, will cont to advance as tolerated 8. RA thrombus: Resolved post thrombolysis  Repeat Echo ordered per Cardiology, reviewed, no thrombus, slightly improved 9. PAF: In NSR on Cardizem, cardiology consulted and diltiazem d/ced, per pt preference.    Will titrate metoprolol as needed, stable at present.    Monitor HR bid.  10 GIB:H/H stable. Continue PPI.   Will continue to monitor for recurrent melena or hematochezia.  -exam benign 11. ABLA:   Has been treated with multiple units PRBC post arterial hemorrhage/GIB.   Hb 10.7. Holding fairly stable, re-check Friday 12. Dysphagia: Tolerating diet advancement. Po intake improving 13. Chronic low back pain: Lumbar films reviewed, 7/7 stable.  Added kpad for local measures 14. Constipation: Resolved    15. UTI  Pseudomonas--60k--cipro started on 7/15 16. Hypokalemia  K+ 3.7 most recently  Continue daily kdur   LOS (Days) 13 A FACE TO FACE  EVALUATION WAS PERFORMED  SWARTZ,ZACHARY T 01/29/2016 8:28 AM

## 2016-01-29 NOTE — Progress Notes (Signed)
Occupational Therapy Session Note  Patient Details  Name: Bonnie Salazar MRN: [id number] Date of Birth: 1940/11/09  Today's Date: 01/29/2016 OT Individual Time: 0850-1000 OT Individual Time Calculation (min): 70 min    Short Term Goals: Week 2:  OT Short Term Goal 1 (Week 2): Pt will perform toileting task with min A OT Short Term Goal 2 (Week 2): Pt will dress LB using AE without need for VCs OT Short Term Goal 3 (Week 2): Pt will complete 1 grooming task standing at sink in order to increase functional activity tolerance  Skilled Therapeutic Interventions/Progress Updates:    Pt seen for OT ADL bathing/dressing session. Pt sitting up in w/c upon arrival, agreeable to tx session. She denied pain at rest, however, voiced increased discomfort in L knee with mobility, requiring VCs for deep breathing technique.  She ambulated throughout session with RW and CGA due to L knee discomfort, requiring VCs to pick up L LE when ambulating.  She bathed seated on tub transfer bench, demonstrating improved basic problem solving skills and carry over of education for implementation of LH sponge compared to previous sessions. VC provided to intiate Lateral leans for buttock hyigene. She dressed seated in w/c using reacher to assist and increased encouragement for participation with dressing tasks. Reacher and sock aid used to assist with LB dressing.  She ambulated to sink and completed grooming tasks in standing, observed not to be weightbearing through L LE due to pain.  She returned to w/c and left sitting with all needs in reach in prep for hand off to SLP. K-pad placed around L knee per request of PA who spoke with pt during OT session.  Rest breaks throughout session due to pain/ anxiety with cuing provided for deep breathing techniques. .   Therapy Documentation Precautions:  Precautions Precautions: Fall Restrictions Weight Bearing Restrictions: Yes LLE Weight Bearing: Weight bearing as  tolerated  See Function Navigator for Current Functional Status.   Therapy/Group: Individual Therapy  Lewis, Lynda Wanninger C 01/29/2016, 6:56 AM

## 2016-01-29 NOTE — Progress Notes (Deleted)
At around 9:30 pm, patient requested for Xanax and Trazodone for sleep, Xanax 0.5 mg and Trazodone 100 mg was given but she continue to complain of not able to sleep; By 1:30 am Linna Hoff PA was paged and he order Xanax 1 mg one time dose then patient was able to sleep. We continue to monitor.

## 2016-01-29 NOTE — Progress Notes (Signed)
Speech Language Pathology Daily Session Note  Patient Details  Name: Bonnie Salazar MRN: [id number] Date of Birth: 03-07-1941  Today's Date: 01/29/2016 SLP Individual Time: 1000-1100 SLP Individual Time Calculation (min): 60 min  Short Term Goals: Week 2: SLP Short Term Goal 1 (Week 2): Pt will consume current diet with mod I use of swallowing precautions and minimal overt s/s of aspiration or fatigue over 3 consecutive sessions.  SLP Short Term Goal 2 (Week 2): Pt will consume trials of regular textures with mod I use of swallowing precautions withou overt s/s of aspiration or fatigue to demonstrate readiness for  advancement.   SLP Short Term Goal 3 (Week 2): Pt will utilize external aids to recall new, semi-complex information Mod I SLP Short Term Goal 4 (Week 2): Pt will solve complex problem related to home management tasks with Supervision level verbal cues  SLP Short Term Goal 5 (Week 2): Pt will utilize word finding strategies during verbal expression of complex ideas Mod I  SLP Short Term Goal 5 - Progress (Week 2): Partly met  Skilled Therapeutic Interventions: Skilled treatment session focused on cognition goals. SLP facilitated session by providing Max encouragement for pt's emotion/discouragement regarding multiple medical conditions (currently being addressed by nursing). Pt not agreeable to trials of PO in session. Pt required Mod A question cues for recall of compensatory swallow strategies. She required Min A to supervision verbal cues for completion of complex money management tasks. Pt without any word finding strategies during verbal expression of semi-complex ideas. Pt returned to room, left in upright in wheelchair with husband entering room. Continue current plan of care.   Function:  Cognition Comprehension Comprehension assist level: Follows basic conversation/direction with no assist  Expression   Expression assist level: Expresses basic needs/ideas: With no  assist  Social Interaction Social Interaction assist level: Interacts appropriately 90% of the time - Needs monitoring or encouragement for participation or interaction.  Problem Solving Problem solving assist level: Solves basic problems with no assist  Memory Memory assist level: More than reasonable amount of time    Pain Pain Assessment Pain Assessment: 0-10 Pain Score: 2  Pain Type: Acute pain Pain Location: Knee Pain Orientation: Left Pain Descriptors / Indicators: Aching Pain Frequency: Intermittent Pain Onset: On-going Patients Stated Pain Goal: 2 Pain Intervention(s): Medication (See eMAR) Multiple Pain Sites: No  Therapy/Group: Individual Therapy  Keishana Klinger 01/29/2016, 12:27 PM

## 2016-01-29 NOTE — Progress Notes (Signed)
Physical Therapy Session Note  Patient Details  Name: Bonnie Salazar MRN: [id number] Date of Birth: March 11, 1941  Today's Date: 01/29/2016 PT Individual Time: 1300-1400 PT Individual Time Calculation (min): 60 min   Short Term Goals: Week 2:  PT Short Term Goal 1 (Week 2): Pt will be able to perform basic transfers consistently with min assist and min verbal cues  PT Short Term Goal 2 (Week 2): Pt will be able to progress gait up to 50' with min assist PT Short Term Goal 3 (Week 2): Pt will be able to negotiate 4 stairs with 2 rails for LE strengthening and mod assist  Skilled Therapeutic Interventions/Progress Updates:   Husband assisting patient with toileting needs upon PT entering room. Pt able to propel w/c down to therapy gym with extra time and supervision for general strengthening and endurance. Nustep for NWB activity (due to increased L knee pain with ambulation, pt declining any standing) but able to work on strengthening and endurance as well as ROM x 10 min total (1 rest break after 4:30) on level 2. Took pt off unit for mood support down to gift shop and worked on w/c mobility in community environment with supervision to min assist needed in tight spaces. End of session, pt request to stay in w/c, so PT elevated BLE with pillows for edema control.   Therapy Documentation Precautions:  Precautions Precautions: Fall Restrictions Weight Bearing Restrictions: Yes LLE Weight Bearing: Weight bearing as tolerated  Pain: c/o L knee pain - premedicated but reporting it hasn't been helping (neither ice or heat either). Reports pain began yesterday.     See Function Navigator for Current Functional Status.   Therapy/Group: Individual Therapy  [name], PT, DPT  01/29/2016, 2:35 PM

## 2016-01-29 NOTE — Progress Notes (Signed)
ANTICOAGULATION CONSULT NOTE - Follow Up Consult  Pharmacy Consult for lovenox Indication: DVT  Allergies  Allergen Reactions  . Hydrocodone     Confusion/sedation    Patient Measurements: Height: 5\' 7"  (170.2 cm) Weight: 134 lb 7.7 oz (61 kg) IBW/kg (Calculated) : 61.6 Heparin Dosing Weight:   Vital Signs: Temp: 97.7 F (36.5 C) (07/20 0438) Temp Source: Oral (07/20 0438) BP: 106/51 mmHg (07/20 0438) Pulse Rate: 92 (07/20 0438)  Labs:  Recent Labs  01/27/16 0514 01/28/16 0632  HGB 10.3* 10.7*  HCT 33.4* 34.0*  PLT 335  --   CREATININE 0.56  --     Estimated Creatinine Clearance: 59.4 mL/min (by C-G formula based on Cr of 0.56).   Medications:  Scheduled:  . acetaminophen  650 mg Oral TID WC & HS  . ciprofloxacin  250 mg Oral BID  . diclofenac sodium  2 g Topical QID  . enoxaparin (LOVENOX) injection  60 mg Subcutaneous Q12H  . feeding supplement (ENSURE ENLIVE)  237 mL Oral BID BM  . Gerhardt's butt cream   Topical BID  . hydrocortisone-pramoxine  1 applicator Rectal BID  . metoprolol tartrate  25 mg Oral TID  . pantoprazole  40 mg Oral Daily  . potassium chloride  20 mEq Oral BID  . tamsulosin  0.4 mg Oral QPC supper   Infusions:    [name] is currently on treatment dose of lovenox.  Last Hgb was 10.7 on 07/18 and Plt 335 K.  Goal of Therapy:  Anti-Xa level 0.6-1 units/ml 4hrs after LMWH dose given Monitor platelets by anticoagulation protocol: Yes   Plan:  Continue lovenox 60 mg sq q12h  CBC every 72 hours F/u oral anticoag  Bonnie Salazar, Tsz-Yin 01/29/2016,8:01 AM

## 2016-01-30 ENCOUNTER — Inpatient Hospital Stay (HOSPITAL_COMMUNITY): Payer: Medicare HMO | Admitting: Occupational Therapy

## 2016-01-30 ENCOUNTER — Inpatient Hospital Stay (HOSPITAL_COMMUNITY): Payer: Medicare HMO

## 2016-01-30 LAB — CBC
HEMATOCRIT: 32.7 % — AB (ref 36.0–46.0)
Hemoglobin: 10.2 g/dL — ABNORMAL LOW (ref 12.0–15.0)
MCH: 30.2 pg (ref 26.0–34.0)
MCHC: 31.2 g/dL (ref 30.0–36.0)
MCV: 96.7 fL (ref 78.0–100.0)
PLATELETS: 403 10*3/uL — AB (ref 150–400)
RBC: 3.38 MIL/uL — AB (ref 3.87–5.11)
RDW: 17.3 % — ABNORMAL HIGH (ref 11.5–15.5)
WBC: 9 10*3/uL (ref 4.0–10.5)

## 2016-01-30 NOTE — Progress Notes (Signed)
Occupational Therapy Session Note  Patient Details  Name: Bonnie Salazar MRN: [id number] Date of Birth: 08-19-1940  Today's Date: 01/30/2016 OT Individual Time:  1100- 1200 Treatment Time: 60 min   Short Term Goals: Week 2:  OT Short Term Goal 1 (Week 2): Pt will perform toileting task with min A OT Short Term Goal 2 (Week 2): Pt will dress LB using AE without need for VCs OT Short Term Goal 3 (Week 2): Pt will complete 1 grooming task standing at sink in order to increase functional activity tolerance  Skilled Therapeutic Interventions/Progress Updates:    Pt seen for skilled OT session focusing on functional mobility and activity tolerance. Pt sitting in w/c with husband present (left throughout session) and agreeable to tx session.  Pt ambulated with RW and close supervision throughout session requiring frequent rest breaks. Pt ambulated to ADL apartment where OT educated pt on step in shower transfer. After demonstration from OT pt able to perform step in shower transfer onto shower chair with CGA for balance. Pt will be purchasing her own shower chair with armrests. Pt ambulated to kitchen and completed dynamic standing balance activities including retrieving items from fridge and cabinets.Pt required frequent rest breaks during activities. OT educated on RW safety in kitchen and energy conservation techniques. Pt ambulated to hospital room and transferred to w/c. Pt left in w/c with husband present and all needs met.   Therapy Documentation Precautions:  Precautions Precautions: Fall Restrictions Weight Bearing Restrictions: Yes LLE Weight Bearing: Weight bearing as tolerated  Pain: Pain Assessment Pain Assessment: 0-10 Pain Score: 8  Pain Type: Acute pain Pain Location:  (knee) Pain Orientation: Left Pain Descriptors / Indicators: Aching Pain Frequency: Intermittent Pain Onset: On-going Patients Stated Pain Goal: 2 Pain Intervention(s): Heat  applied;Distraction Multiple Pain Sites: No  See Function Navigator for Current Functional Status.   Therapy/Group: Individual Therapy  Matilde Bash 01/30/2016, 12:11 PM

## 2016-01-30 NOTE — Progress Notes (Signed)
Occupational Therapy Session Note  Patient Details  Name: Bonnie Salazar MRN: [id number] Date of Birth: Dec 04, 1940  Today's Date: 01/30/2016 OT Individual Time: 0905-1005 OT Individual Time Calculation (min): 60 min   Short Term Goals: Week 1:  OT Short Term Goal 1 (Week 1): Pt will engage in 5 minutes of functional task before needing rest break secondary to fatigue.  OT Short Term Goal 1 - Progress (Week 1): Not met OT Short Term Goal 2 (Week 1): Pt will perform toilet transfer with mod A in order to increase I with functional transfers.  OT Short Term Goal 2 - Progress (Week 1): Not met OT Short Term Goal 3 (Week 1): Pt will perform UB dressing with set up A in order to increase I with self care.  OT Short Term Goal 3 - Progress (Week 1): Met OT Short Term Goal 4 (Week 1): Pt will perform LB dressing with max A in order to decrease level of assistance with self care.   Week 2:  OT Short Term Goal 1 (Week 2): Pt will perform toileting task with min A OT Short Term Goal 2 (Week 2): Pt will dress LB using AE without need for VCs OT Short Term Goal 3 (Week 2): Pt will complete 1 grooming task standing at sink in order to increase functional activity tolerance  Skilled Therapeutic Interventions/Progress Updates:  Patient found supine in bed, trying to get up with husband to go use restroom. Pt with no complaints of pain and stated today feels better then yesterday. Therapist educated pt and husband on correct bed mobility technique for safety and efficiency. Pt able to perform bed mobility with min guard assist. From here, pt stood with RW and ambulated into BR. Pt with incontinence in brief. Pt sat on BSC with min assist and required max assist for toileting needs (clothing management and peri care). Pt stood with RW and ambulated into shower for shower stall transfer onto tub transfer bench. Pt completed UB/LB bathing in sit to/from stand position, using LH sponge to increase independence.  Pt dried and ambulated to w/c set-up near sink. UB/LB dressing completed in sit to/from stand position using reacher to increase independence, due to time therapist donned bilateral socks. Pt stood at sink for grooming tasks of brushing hair and brushing teeth. At end of session, left pt seated in w/c with husband present and all needs within reach.   Therapy Documentation Precautions:  Precautions Precautions: Fall Restrictions Weight Bearing Restrictions: Yes LLE Weight Bearing: Weight bearing as tolerated  Vital Signs: Therapy Vitals Temp: 98.2 F (36.8 C) Temp Source: Oral Pulse Rate: 93 Resp: 18 BP: (!) 119/53 mmHg Patient Position (if appropriate): Lying Oxygen Therapy SpO2: 96 % O2 Device: Not Delivered  See Function Navigator for Current Functional Status.  Therapy/Group: Individual Therapy  Chrys Racer , MS, OTR/L, CLT  01/30/2016, 10:47 AM

## 2016-01-30 NOTE — Progress Notes (Signed)
Physical Therapy Session Note  Patient Details  Name: Bonnie Salazar MRN: [id number] Date of Birth: 1941/02/23  Today's Date: 01/30/2016 PT Individual Time: 1300-1415 PT Individual Time Calculation (min): 75 min   Short Term Goals: Week 2:  PT Short Term Goal 1 (Week 2): Pt will be able to perform basic transfers consistently with min assist and min verbal cues  PT Short Term Goal 2 (Week 2): Pt will be able to progress gait up to 50' with min assist PT Short Term Goal 3 (Week 2): Pt will be able to negotiate 4 stairs with 2 rails for LE strengthening and mod assist  Skilled Therapeutic Interventions/Progress Updates:   Session focused on w/c mobility for general strengthening and endurance, transfers at supervision level with RW and extra time, dynamic standing balance and tolerance activity on compliant surface with and without UE support to pitch horseshoes, gait training with RW with close supervision to tolerance due to L knee pain x 50', and therex for LE strengthening and ROM. Instructed in LAQ, standing heel raises, seated toe raises but then due to L knee pain, suggested Nustep and pt agreeable (x 10 min on level 3). Pt requires encouragement throughout session. Discussed d/c planning and concerns for home as well as how follow up therapies work during therapeutic rest breaks throughout session. Pt able to gait part of the way back to room until L knee pain increased and fatigued. Checked husband off on ambulating with patient to/from the bathroom and put on safety plan.   Therapy Documentation Precautions:  Precautions Precautions: Fall Restrictions Weight Bearing Restrictions: Yes LLE Weight Bearing: Weight bearing as tolerated   Pain: c/o pain in L knee - premedicated and using heating pad.     See Function Navigator for Current Functional Status.   Therapy/Group: Individual Therapy  [name], PT, DPT  01/30/2016, 2:20 PM

## 2016-01-30 NOTE — Progress Notes (Signed)
[place] PHYSICAL MEDICINE & REHABILITATION     PROGRESS NOTE  Subjective/Complaints:  Left leg feeling better but still sore.   Able to participate in therapy yesterday. In good spirits  ROS: Denies CP, SOB, N/V/D.  Objective: Vital Signs: Blood pressure 119/53, pulse 93, temperature 98.2 F (36.8 C), temperature source Oral, resp. rate 18, height  (1.702 m), weight 62.6 kg (138 lb 0.1 oz), SpO2 96 %. No results found.  Recent Labs  01/28/16 0632 01/30/16 0542  WBC  --  9.0  HGB 10.7* 10.2*  HCT 34.0* 32.7*  PLT  --  403*   No results for input(s): NA, K, CL, GLUCOSE, BUN, CREATININE, CALCIUM in the last 72 hours.  Invalid input(s): CO CBG (last 3)  No results for input(s): GLUCAP in the last 72 hours.  Wt Readings from Last 3 Encounters:  01/30/16 62.6 kg (138 lb 0.1 oz)  01/16/16 63.4 kg (139 lb 12.4 oz)    Physical Exam:  BP 119/53 mmHg  Pulse 93  Temp(Src) 98.2 F (36.8 C) (Oral)  Resp 18  Ht  (1.702 m)  Wt 62.6 kg (138 lb 0.1 oz)  BMI 21.61 kg/m2  SpO2 96% Constitutional: She appears well-developed and well-nourished. NAD.  HENT: Normocephalic and atraumatic.  Eyes: Conjunctivae and EOM are normal.  Cardiovascular: Normal rate and regular rhythm.  Respiratory: Effort normal and breath sounds normal. No stridor. No respiratory distress. She has no wheezes.  GI: + BS. She exhibits distension. There is no tenderness. Belly a little firm Musculoskeletal: She exhibits edema and tenderness. Mild tenderness in left popliteal fossa  Edema LLE 1++ Neurological: She is alert and oriented.  Able to follow commands without difficulty.  Motor: B/l UE: 4+/5 proximal to distal RLE: Hip flexion 4/5, knee extension 4/5, ankle dorsi/plantar flexion 5/5 LLE: Hip flexion 3/5, knee extension 3/5, ankle dorsi/plantar flexion 5/5 Skin: Skin is warm and dry.  Left anterior hip incision healed  Psychiatric: affect more dynamic. Cognition and memory are normal.    Assessment/Plan: 1. Functional deficits secondary to debility after left hip fracture complicated by arterial bleed, hemorrhagic shock, bilateral PE, PEA, Atrial thrombus, GIB. medical issues. which require 3+ hours per day of interdisciplinary therapy in a comprehensive inpatient rehab setting. Physiatrist is providing close team supervision and 24 hour management of active medical problems listed below. Physiatrist and rehab team continue to assess barriers to discharge/monitor patient progress toward functional and medical goals.  Function:  Bathing Bathing position   Position: Shower  Bathing parts Body parts bathed by patient: Right arm, Left arm, Chest, Abdomen, Front perineal area, Buttocks, Right upper leg, Left upper leg, Right lower leg, Left lower leg Body parts bathed by helper: Back  Bathing assist Assist Level: Supervision or verbal cues      Upper Body Dressing/Undressing Upper body dressing Upper body dressing/undressing activity did not occur: Refused What is the patient wearing?: Pull over shirt/dress     Pull over shirt/dress - Perfomed by patient: Thread/unthread right sleeve, Put head through opening, Pull shirt over trunk, Thread/unthread left sleeve Pull over shirt/dress - Perfomed by helper: Thread/unthread left sleeve        Upper body assist Assist Level: Set up   Set up : To obtain clothing/put away  Lower Body Dressing/Undressing Lower body dressing   What is the patient wearing?: Pants, Non-skid slipper socks     Pants- Performed by patient: Thread/unthread right pants leg, Thread/unthread left pants leg, Pull pants up/down Pants-  Performed by helper: Pull pants up/down Non-skid slipper socks- Performed by patient: Don/doff right sock, Don/doff left sock (Sock aid) Non-skid slipper socks- Performed by helper: Don/doff right sock, Don/doff left sock               TED Hose - Performed by helper: Don/doff right TED hose, Don/doff left TED  hose  Lower body assist Assist for lower body dressing: Touching or steadying assistance (Pt > 75%)   Set up : To obtain clothing/put away  Toileting Toileting Toileting activity did not occur: No continent bowel/bladder event Toileting steps completed by patient: Adjust clothing prior to toileting, Performs perineal hygiene, Adjust clothing after toileting Toileting steps completed by helper: Adjust clothing prior to toileting, Performs perineal hygiene, Adjust clothing after toileting Toileting Assistive Devices: Grab bar or rail  Toileting assist Assist level: Supervision or verbal cues   Transfers Chair/bed transfer Chair/bed transfer activity did not occur: Refused Chair/bed transfer method: Ambulatory Chair/bed transfer assist level: Touching or steadying assistance (Pt > 75%) Chair/bed transfer assistive device: Armrests, Medical sales representative Ambulation activity did not occur: Refused   Max distance: 40 ft Assist level: Touching or steadying assistance (Pt > 75%)   Wheelchair   Type: Manual Max wheelchair distance: 100 ft Assist Level: Supervision or verbal cues  Cognition Comprehension Comprehension assist level: Follows basic conversation/direction with no assist  Expression Expression assist level: Expresses basic needs/ideas: With no assist  Social Interaction Social Interaction assist level: Interacts appropriately 90% of the time - Needs monitoring or encouragement for participation or interaction.  Problem Solving Problem solving assist level: Solves basic problems with no assist  Memory Memory assist level: Recognizes or recalls 90% of the time/requires cueing < 10% of the time    Medical Problem List and Plan: 1. Abnormality of gait, weakness, low endurance secondary to debility after left hip fracture complicated by arterial bleed, hemorrhagic shock, bilateral PE, PEA, Atrial thrombus, GIB. medical issues.  -Cont CIR   -ELOS 7/26 2. Bilateral  PE/DVT Prophylaxis/Anticoagulation:  IVC filter in place.   Hx of GIB.   LE U/S showing extensive b/l DVTs  -appreciate Heme recs  -hgb have been stable between 10 and 11 (10.2 today)--no signs of blood loss  -lovenox initiated Monday  -recheck Monday 3. Pain Management: scheduled tylenol. Tramadol prn  -ice prn to left knee--suspect some of discomfort is from exercise/thrombus  4. Mood: Showing activation and humor. LCSW to follow for evaluation and support.  5. Neuropsych: This patient is capable of making decisions on her own behalf. 6. Skin/Wound Care: Routine pressure relief measures. Monitor incision daily for healing.  7. Fluids/Electrolytes/Nutrition: Monitor I/O.   Continue to encourage PO  Advanced to dysphagia 3, thin diet, will cont to advance as tolerated 8. RA thrombus: Resolved post thrombolysis  Repeat Echo ordered per Cardiology, reviewed, no thrombus, slightly improved 9. PAF: In NSR on Cardizem, cardiology consulted and diltiazem d/ced, per pt preference.    Will titrate metoprolol as needed, stable at present.    Monitor HR bid.  10 GIB:H/H stable. Continue PPI.   Will continue to monitor for recurrent melena or hematochezia.  -exam benign 11. ABLA:   Has been treated with multiple units PRBC post arterial hemorrhage/GIB.   Hb 10.2 as above 12. Dysphagia: Tolerating diet advancement. Po intake improving 13. Chronic low back pain: Lumbar films reviewed, 7/7 stable.  Added kpad for local measures 14. Constipation: Resolved    15. UTI  Pseudomonas--60k--cipro started  on 7/15 16. Hypokalemia  K+ 3.7 most recently  Continue daily kdur   -recheck labs monday  LOS (Days) 14 A FACE TO FACE EVALUATION WAS PERFORMED  Brit Wernette T 01/30/2016 8:57 AM

## 2016-01-31 ENCOUNTER — Inpatient Hospital Stay (HOSPITAL_COMMUNITY): Payer: Medicare HMO | Admitting: Speech Pathology

## 2016-01-31 ENCOUNTER — Inpatient Hospital Stay (HOSPITAL_COMMUNITY): Payer: Medicare HMO

## 2016-01-31 NOTE — Progress Notes (Signed)
[place] PHYSICAL MEDICINE & REHABILITATION     PROGRESS NOTE  Subjective/Complaints:  Nocturnal incont, voids in diaper  ROS: Denies CP, SOB, N/V/D.  Objective: Vital Signs: Blood pressure 116/62, pulse 96, temperature 98.2 F (36.8 C), temperature source Oral, resp. rate 17, height 5\' 7"  (1.702 m), weight 58.8 kg (129 lb 10.1 oz), SpO2 97 %. No results found.  Recent Labs  01/28/16 0632 01/30/16 0542  WBC  --  9.0  HGB 10.7* 10.2*  HCT 34.0* 32.7*  PLT  --  403*   No results for input(s): NA, K, CL, GLUCOSE, BUN, CREATININE, CALCIUM in the last 72 hours.  Invalid input(s): CO CBG (last 3)  No results for input(s): GLUCAP in the last 72 hours.  Wt Readings from Last 3 Encounters:  01/31/16 58.8 kg (129 lb 10.1 oz)  01/16/16 63.4 kg (139 lb 12.4 oz)    Physical Exam:  BP 116/62 mmHg  Pulse 96  Temp(Src) 98.2 F (36.8 C) (Oral)  Resp 17  Ht 5\' 7"  (1.702 m)  Wt 58.8 kg (129 lb 10.1 oz)  BMI 20.30 kg/m2  SpO2 97% Constitutional: She appears well-developed and well-nourished. NAD.  HENT: Normocephalic and atraumatic.  Eyes: Conjunctivae and EOM are normal.  Cardiovascular: Normal rate and regular rhythm.  Respiratory: Effort normal and breath sounds normal. No stridor. No respiratory distress. She has no wheezes.  GI: + BS. She exhibits distension. There is no tenderness. Belly a little firm Musculoskeletal: She exhibits edema and tenderness. Mild tenderness in left popliteal fossa  Edema LLE 1++ Neurological: She is alert and oriented.  Able to follow commands without difficulty.  Motor: B/l UE: 4+/5 proximal to distal RLE: Hip flexion 4/5, knee extension 4/5, ankle dorsi/plantar flexion 5/5 LLE: Hip flexion 3/5, knee extension 3/5, ankle dorsi/plantar flexion 5/5 Skin: Skin is warm and dry.  Left anterior hip incision healed  Psychiatric: affect more dynamic. Cognition and memory are normal.   Assessment/Plan: 1. Functional deficits secondary to  debility after left hip fracture complicated by arterial bleed, hemorrhagic shock, bilateral PE, PEA, Atrial thrombus, GIB. medical issues. which require 3+ hours per day of interdisciplinary therapy in a comprehensive inpatient rehab setting. Physiatrist is providing close team supervision and 24 hour management of active medical problems listed below. Physiatrist and rehab team continue to assess barriers to discharge/monitor patient progress toward functional and medical goals.  Function:  Bathing Bathing position   Position: Shower  Bathing parts Body parts bathed by patient: Right arm, Left arm, Chest, Abdomen, Front perineal area, Buttocks, Right upper leg, Left upper leg, Right lower leg, Left lower leg Body parts bathed by helper: Back  Bathing assist Assist Level: Supervision or verbal cues      Upper Body Dressing/Undressing Upper body dressing Upper body dressing/undressing activity did not occur: Refused What is the patient wearing?: Pull over shirt/dress     Pull over shirt/dress - Perfomed by patient: Thread/unthread right sleeve, Put head through opening, Pull shirt over trunk, Thread/unthread left sleeve Pull over shirt/dress - Perfomed by helper: Thread/unthread left sleeve        Upper body assist Assist Level: Set up   Set up : To obtain clothing/put away  Lower Body Dressing/Undressing Lower body dressing   What is the patient wearing?: Pants, Non-skid slipper socks     Pants- Performed by patient: Thread/unthread right pants leg, Thread/unthread left pants leg, Pull pants up/down Pants- Performed by helper: Pull pants up/down Non-skid slipper socks- Performed by patient: Don/doff  right sock, Don/doff left sock (Sock aid) Non-skid slipper socks- Performed by helper: Don/doff right sock, Don/doff left sock               TED Hose - Performed by helper: Don/doff right TED hose, Don/doff left TED hose  Lower body assist Assist for lower body dressing:  Touching or steadying assistance (Pt > 75%)   Set up : To obtain clothing/put away  Toileting Toileting Toileting activity did not occur: No continent bowel/bladder event Toileting steps completed by patient: Adjust clothing prior to toileting, Performs perineal hygiene, Adjust clothing after toileting Toileting steps completed by helper: Adjust clothing prior to toileting, Performs perineal hygiene, Adjust clothing after toileting Toileting Assistive Devices: Grab bar or rail  Toileting assist Assist level: Supervision or verbal cues   Transfers Chair/bed transfer Chair/bed transfer activity did not occur: Refused Chair/bed transfer method: Ambulatory Chair/bed transfer assist level: Touching or steadying assistance (Pt > 75%) Chair/bed transfer assistive device: Armrests, Walker     Locomotion Ambulation Ambulation activity did not occur: Refused   Max distance: 4' Assist level: Touching or steadying assistance (Pt > 75%)   Wheelchair   Type: Manual Max wheelchair distance: 120' Assist Level: Supervision or verbal cues  Cognition Comprehension Comprehension assist level: Follows basic conversation/direction with no assist  Expression Expression assist level: Expresses basic needs/ideas: With no assist  Social Interaction Social Interaction assist level: Interacts appropriately 90% of the time - Needs monitoring or encouragement for participation or interaction.  Problem Solving Problem solving assist level: Solves basic problems with no assist  Memory Memory assist level: Recognizes or recalls 90% of the time/requires cueing < 10% of the time    Medical Problem List and Plan: 1. Abnormality of gait, weakness, low endurance secondary to debility after left hip fracture complicated by arterial bleed, hemorrhagic shock, bilateral PE, PEA, Atrial thrombus, GIB. medical issues.  -Cont CIR   -ELOS 7/26 2. Bilateral PE/DVT Prophylaxis/Anticoagulation:  IVC filter in place.    Hx of GIB.   LE U/S showing extensive b/l DVTs  -appreciate Heme recs  -hgb have been stable between 10 and 11 (10.2 today)--no signs of blood loss  -lovenox 1mg /kg   -recheck Monday 3. Pain Management: scheduled tylenol. Tramadol prn  -ice prn to left knee--suspect some of discomfort is from exercise/thrombus  4. Mood: Showing activation and humor. LCSW to follow for evaluation and support.  5. Neuropsych: This patient is capable of making decisions on her own behalf. 6. Skin/Wound Care: Routine pressure relief measures. Monitor incision daily for healing.  7. Fluids/Electrolytes/Nutrition: Monitor I/O.   Continue to encourage PO  Advanced to dysphagia 3, thin diet, will cont to advance as tolerated 8. RA thrombus: Resolved post thrombolysis  Repeat Echo ordered per Cardiology, reviewed, no thrombus, slightly improved 9. PAF: In NSR on Cardizem, cardiology consulted and diltiazem d/ced, per pt preference.    Will titrate metoprolol as needed, stable at present.    Monitor HR bid.  10 GIB:H/H stable. Continue PPI.   Will continue to monitor for recurrent melena or hematochezia.  -exam benign 11. ABLA:   Has been treated with multiple units PRBC post arterial hemorrhage/GIB.   Hb 10.2 as above 12. Dysphagia: Tolerating diet advancement. Po intake improving 13. Chronic low back pain: Lumbar films reviewed, 7/7 stable.  Added kpad for local measures 14. Constipation: Resolved    15. UTI  Pseudomonas--60k--cipro started on 7/15- d/c today 16. Hypokalemia  K+ 3.7 most recently  Continue daily kdur   -  recheck labs monday 17.  Freq urination- BVI inaccurate due to pelvic hematoma anterior to bladder- likely bladder volume is compromised LOS (Days) 15 A FACE TO FACE EVALUATION WAS PERFORMED  Bonnie Salazar 01/31/2016 5:39 AM

## 2016-01-31 NOTE — Progress Notes (Signed)
Speech Language Pathology Weekly Progress and Session Note  Patient Details  Name: Bonnie Salazar MRN: [id number] Date of Birth: 06-Apr-1941  Beginning of progress report period: January 23, 2016 End of progress report period: January 31, 2016  Today's Date: 01/31/2016 SLP Individual Time: 2505-3976 SLP Individual Time Calculation (min): 55 min  Short Term Goals: Week 2: SLP Short Term Goal 1 (Week 2): Pt will consume current diet with mod I use of swallowing precautions and minimal overt s/s of aspiration or fatigue over 3 consecutive sessions.  SLP Short Term Goal 1 - Progress (Week 2): Met SLP Short Term Goal 2 (Week 2): Pt will consume trials of regular textures with mod I use of swallowing precautions withou overt s/s of aspiration or fatigue to demonstrate readiness for  advancement.   SLP Short Term Goal 2 - Progress (Week 2): Met SLP Short Term Goal 3 (Week 2): Pt will utilize external aids to recall new, semi-complex information Mod I SLP Short Term Goal 3 - Progress (Week 2): Not met SLP Short Term Goal 4 (Week 2): Pt will solve complex problem related to home management tasks with Supervision level verbal cues  SLP Short Term Goal 4 - Progress (Week 2): Not met SLP Short Term Goal 5 (Week 2): Pt will utilize word finding strategies during verbal expression of complex ideas Mod I  SLP Short Term Goal 5 - Progress (Week 2): Met    New Short Term Goals: Week 3: SLP Short Term Goal 1 (Week 3): Pt will utilize external aids to recall new, semi-complex information Mod I SLP Short Term Goal 2 (Week 3): Pt will solve complex problem related to home management tasks with Supervision level verbal cues   Weekly Progress Updates: Patient has made functional gains and has met 3 of 5 STG's this reporting period. Currently, patient is consuming regular textures with thin liquids and requires intermittent verbal cues for use of swallowing compensatory strategies. Patient also requires overall Min  A to complete complex tasks safely and for recall of new information. Patient and education is ongoing. Patient would benefit from continued skilled SLP intervention to maximize cognitive function and overall functional independence prior to discharge.     Intensity: Minumum of 1-2 x/day, 30 to 90 minutes Frequency: 3 to 5 out of 7 days Duration/Length of Stay: 7/26 Treatment/Interventions: Cueing hierarchy;Environmental controls;Functional tasks;Internal/external aids;Patient/family education;Cognitive remediation/compensation   Daily Session  Skilled Therapeutic Interventions: Skilled treatment session focused on cognitive and dysphagia goals. Patient reported she needed to use the bathroom and ambulated with the RW and did not require cues for safety. Patient and husband asking appropriate questions in regards to discharge, equipment needs, etc. All questions were answered at this time. Patient also consumed thin liquids via straw with intermittent, subtle throat clearing, suspect due to bolus size. Patient left upright in recliner with all needs within reach. Continue with current plan of care.       Function:   Eating Eating   Modified Consistency Diet: No Eating Assist Level: Supervision or verbal cues           Cognition Comprehension Comprehension assist level: Follows basic conversation/direction with no assist  Expression   Expression assist level: Expresses basic needs/ideas: With no assist  Social Interaction Social Interaction assist level: Interacts appropriately 90% of the time - Needs monitoring or encouragement for participation or interaction.  Problem Solving Problem solving assist level: Solves basic problems with no assist  Memory Memory assist level: Recognizes or recalls  90% of the time/requires cueing < 10% of the time   Pain Pain Assessment Pain Assessment: No/denies pain Pain Score: 0-No pain Pain Type: Acute pain Pain Location: Knee Pain Orientation:  Left Pain Descriptors / Indicators: Aching Pain Frequency: Intermittent Pain Onset: With Activity Pain Intervention(s): Medication (See eMAR);Repositioned;Emotional support Multiple Pain Sites: No  Therapy/Group: Individual Therapy  Sheng Pritz 01/31/2016, 4:13 PM

## 2016-01-31 NOTE — Progress Notes (Signed)
Physical Therapy Session Note  Patient Details  Name: Bonnie Salazar MRN: [id number] Date of Birth: 1940-08-25  Today's Date: 01/31/2016 PT Individual Time: 1445-1525 PT Individual Time Calculation (min): 40 min   Short Term Goals: Week 2:  PT Short Term Goal 1 (Week 2): Pt will be able to perform basic transfers consistently with min assist and min verbal cues  PT Short Term Goal 2 (Week 2): Pt will be able to progress gait up to 50' with min assist PT Short Term Goal 3 (Week 2): Pt will be able to negotiate 4 stairs with 2 rails for LE strengthening and mod assist  Skilled Therapeutic Interventions/Progress Updates:    Pt and husband with questions about DME so discussed recommendations and decided on them getting a transport chair for community mobility and we will order RW. Focused on bed mobility retraining without rails from bed height that husband measured from home (26") and pt able to complete with overall supervision and extra time. Transfers during session today were also consistently at supervision level with RW with min verbal cues for hand placement. Simulated car transfer training (pt able to manage BLE today compared to last time). Practiced ramp negotiation with RW and stairs to simulate community mobility and access to her hair dresser's place (she plans to call to find out if there are railings on the stairs). Gait to/from ortho gym with overall supervision with cues for upright posture and decreased reliance on RW for support. Pt demonstrating improving activity tolerance levels as well. Set up in recliner with LE's elevated at end of session with all needs in reach.   Therapy Documentation Precautions:  Precautions Precautions: Fall Restrictions Weight Bearing Restrictions: Yes LLE Weight Bearing: Weight bearing as tolerated  Pain: Premedicated for LLE pain. Unrated .   See Function Navigator for Current Functional Status.   Therapy/Group: Individual  Therapy  [name], PT, DPT  01/31/2016, 3:35 PM

## 2016-01-31 NOTE — Progress Notes (Signed)
Physical Therapy Weekly Progress Note  Patient Details  Name: Bonnie Salazar MRN: [id number] Date of Birth: Nov 27, 1940  Beginning of progress report period: January 23, 2016 End of progress report period: January 31, 2016   Patient has met 3 of 3 short term goals.  Pt is making good functional progress with overall mobility at overall min assist level. Pt continues to be limited by low endurance, pain in LLE (new onset pain in L knee more so than hip), and anxiety. [name] has been initiated with family education especially for toileting needs and transfers in the room. Planned d/c is 7/26 at overall supervision to min assist level.   Patient continues to demonstrate the following deficits: decreased strength, decreased balance, decreased ROM, edema, pain, decreased endurance/activity tolerance, decreased functional mobility, impaired cognition and therefore will continue to benefit from skilled PT intervention to enhance overall performance with activity tolerance, balance, ability to compensate for deficits, functional use of  left lower extremity and awareness.  Patient progressing toward long term goals..  Continue plan of care.  PT Short Term Goals Week 2:  PT Short Term Goal 1 (Week 2): Pt will be able to perform basic transfers consistently with min assist and min verbal cues  PT Short Term Goal 1 - Progress (Week 2): Met PT Short Term Goal 2 (Week 2): Pt will be able to progress gait up to 50' with min assist PT Short Term Goal 2 - Progress (Week 2): Met PT Short Term Goal 3 (Week 2): Pt will be able to negotiate 4 stairs with 2 rails for LE strengthening and mod assist PT Short Term Goal 3 - Progress (Week 2): Met Week 3:  PT Short Term Goal 1 (Week 3): = LTGS overall supervision to min assist  Skilled Therapeutic Interventions/Progress Updates:  Ambulation/gait training;Discharge planning;Functional mobility training;Psychosocial support;Therapeutic Activities;Balance/vestibular  training;Neuromuscular re-education;Therapeutic Exercise;Wheelchair propulsion/positioning;Cognitive remediation/compensation;DME/adaptive equipment instruction;Pain management;Splinting/orthotics;UE/LE Strength taining/ROM;Community reintegration;Patient/family education;Stair training;Skin care/wound management;UE/LE Coordination activities   Therapy Documentation Precautions:  Precautions Precautions: Fall Restrictions Weight Bearing Restrictions: Yes LLE Weight Bearing: Weight bearing as tolerated    See Function Navigator for Current Functional Status.   [name], PT, DPT  01/31/2016, 12:58 PM

## 2016-02-01 ENCOUNTER — Inpatient Hospital Stay (HOSPITAL_COMMUNITY): Payer: Medicare HMO | Admitting: Physical Therapy

## 2016-02-01 MED ORDER — POTASSIUM CHLORIDE CRYS ER 10 MEQ PO TBCR
20.0000 meq | EXTENDED_RELEASE_TABLET | Freq: Two times a day (BID) | ORAL | Status: DC
  Administered 2016-02-01 – 2016-02-04 (×7): 20 meq via ORAL
  Filled 2016-02-01 (×7): qty 2

## 2016-02-01 NOTE — Progress Notes (Signed)
[place] PHYSICAL MEDICINE & REHABILITATION     PROGRESS NOTE  Subjective/Complaints:  Discussed bladder issues and large pelvic hematoma  ROS: Denies CP, SOB, N/V/D.  Objective: Vital Signs: Blood pressure (!) 105/55, pulse 87, temperature 97.8 F (36.6 C), temperature source Oral, resp. rate 17, height 5\' 7"  (1.702 m), weight 61.5 kg (135 lb 9.3 oz), SpO2 96 %. No results found.  Recent Labs  01/30/16 0542  WBC 9.0  HGB 10.2*  HCT 32.7*  PLT 403*   No results for input(s): NA, K, CL, GLUCOSE, BUN, CREATININE, CALCIUM in the last 72 hours.  Invalid input(s): CO CBG (last 3)  No results for input(s): GLUCAP in the last 72 hours.  Wt Readings from Last 3 Encounters:  02/01/16 61.5 kg (135 lb 9.3 oz)  01/16/16 63.4 kg (139 lb 12.4 oz)    Physical Exam:  BP (!) 105/55 (BP Location: Left Arm)   Pulse 87   Temp 97.8 F (36.6 C) (Oral)   Resp 17   Ht 5\' 7"  (1.702 m)   Wt 61.5 kg (135 lb 9.3 oz)   SpO2 96%   BMI 21.24 kg/m  Constitutional: She appears well-developed and well-nourished. NAD.  HENT: Normocephalic and atraumatic.  Eyes: Conjunctivae and EOM are normal.  Cardiovascular: Normal rate and regular rhythm.  Respiratory: Effort normal and breath sounds normal. No stridor. No respiratory distress. She has no wheezes.  GI: + BS. She exhibits distension. There is no tenderness. Belly a little firm Musculoskeletal: She exhibits edema and tenderness. Mild tenderness in left popliteal fossa  Edema LLE 1++ Neurological: She is alert and oriented.  Able to follow commands without difficulty.  Motor: B/l UE: 4+/5 proximal to distal RLE: Hip flexion 4/5, knee extension 4/5, ankle dorsi/plantar flexion 5/5 LLE: Hip flexion 3/5, knee extension 3/5, ankle dorsi/plantar flexion 5/5 Skin: Skin is warm and dry.  Left anterior hip incision healed  Psychiatric: affect more dynamic. Cognition and memory are normal.   Assessment/Plan: 1. Functional deficits secondary to  debility after left hip fracture complicated by arterial bleed, hemorrhagic shock, bilateral PE, PEA, Atrial thrombus, GIB. medical issues. which require 3+ hours per day of interdisciplinary therapy in a comprehensive inpatient rehab setting. Physiatrist is providing close team supervision and 24 hour management of active medical problems listed below. Physiatrist and rehab team continue to assess barriers to discharge/monitor patient progress toward functional and medical goals.  Function:  Bathing Bathing position   Position: Shower  Bathing parts Body parts bathed by patient: Right arm, Left arm, Chest, Abdomen, Front perineal area, Buttocks, Right upper leg, Left upper leg, Right lower leg, Left lower leg Body parts bathed by helper: Back  Bathing assist Assist Level: Supervision or verbal cues      Upper Body Dressing/Undressing Upper body dressing Upper body dressing/undressing activity did not occur: Refused What is the patient wearing?: Pull over shirt/dress     Pull over shirt/dress - Perfomed by patient: Thread/unthread right sleeve, Put head through opening, Pull shirt over trunk, Thread/unthread left sleeve Pull over shirt/dress - Perfomed by helper: Thread/unthread left sleeve        Upper body assist Assist Level: Set up   Set up : To obtain clothing/put away  Lower Body Dressing/Undressing Lower body dressing   What is the patient wearing?: Pants, Non-skid slipper socks     Pants- Performed by patient: Thread/unthread right pants leg, Thread/unthread left pants leg, Pull pants up/down Pants- Performed by helper: Pull pants up/down Non-skid slipper  socks- Performed by patient: Don/doff right sock, Don/doff left sock (Sock aid) Non-skid slipper socks- Performed by helper: Don/doff right sock, Don/doff left sock               TED Hose - Performed by helper: Don/doff right TED hose, Don/doff left TED hose  Lower body assist Assist for lower body dressing:  Touching or steadying assistance (Pt > 75%)   Set up : To obtain clothing/put away  Toileting Toileting Toileting activity did not occur: Safety/medical concerns Toileting steps completed by patient:  (Patient assisted with tasks ) Toileting steps completed by helper: Adjust clothing prior to toileting, Performs perineal hygiene, Adjust clothing after toileting Toileting Assistive Devices: Grab bar or rail  Toileting assist Assist level: Supervision or verbal cues   Transfers Chair/bed transfer Chair/bed transfer activity did not occur: Refused Chair/bed transfer method: Ambulatory Chair/bed transfer assist level: Supervision or verbal cues Chair/bed transfer assistive device: Armrests, Walker     Locomotion Ambulation Ambulation activity did not occur: Refused   Max distance: 74' Assist level: Supervision or verbal cues   Wheelchair   Type: Manual Max wheelchair distance: 120' Assist Level: Supervision or verbal cues  Cognition Comprehension Comprehension assist level: Follows complex conversation/direction with no assist  Expression Expression assist level: Expresses complex ideas: With no assist  Social Interaction Social Interaction assist level: Interacts appropriately 90% of the time - Needs monitoring or encouragement for participation or interaction.  Problem Solving Problem solving assist level: Solves basic problems with no assist  Memory Memory assist level: Recognizes or recalls 90% of the time/requires cueing < 10% of the time    Medical Problem List and Plan: 1. Abnormality of gait, weakness, low endurance secondary to debility after left hip fracture complicated by arterial bleed, hemorrhagic shock, bilateral PE, PEA, Atrial thrombus, GIB. medical issues.  -Cont CIR   -ELOS 7/26 2. Bilateral PE/DVT Prophylaxis/Anticoagulation:  IVC filter in place.   Hx of GIB.   LE U/S showing extensive b/l DVTs  -appreciate Heme recs  -hgb have been stable between 10  and 11 (10.2 today)--no signs of blood loss  -lovenox 1mg /kg- monitor for expanding pelvic hematoma  -recheck Monday 3. Pain Management: scheduled tylenol. Tramadol prn  -ice prn to left knee--suspect some of discomfort is from exercise/thrombus  4. Mood: Showing activation and humor. LCSW to follow for evaluation and support.  5. Neuropsych: This patient is capable of making decisions on her own behalf. 6. Skin/Wound Care: Routine pressure relief measures. Monitor incision daily for healing.  7. Fluids/Electrolytes/Nutrition: Monitor I/O.   Continue to encourage PO  Advanced to dysphagia 3, thin diet, will cont to advance as tolerated 8. RA thrombus: Resolved post thrombolysis  Repeat Echo ordered per Cardiology, reviewed, no thrombus, slightly improved 9. PAF: In NSR on Cardizem, cardiology consulted and diltiazem d/ced, per pt preference.    Will titrate metoprolol as needed, stable at present.    Monitor HR bid.  10 GIB:H/H stable. Continue PPI.   Will continue to monitor for recurrent melena or hematochezia.  -exam benign 11. ABLA:   Has been treated with multiple units PRBC post arterial hemorrhage/GIB.   Hb 10.2 as above 12. Dysphagia: Tolerating diet advancement. Po intake improving 13. Chronic low back pain: Lumbar films reviewed, 7/7 stable.  Added kpad for local measures 14. Constipation: Resolved    15. UTI  Resolved, re culture if symptomatic beyond just freq 16. Hypokalemia  K+ 3.7 most recently  Continue daily kdur   -recheck  labs monday 17.  Freq urination- BVI inaccurate due to pelvic hematoma anterior to bladder- likely bladder volume is compromised LOS (Days) 16 A FACE TO FACE EVALUATION WAS PERFORMED  KIRSTEINS,ANDREW E 02/01/2016 7:08 AM

## 2016-02-01 NOTE — Progress Notes (Signed)
Physical Therapy Session Note  Patient Details  Name: Bonnie Salazar MRN: [id number] Date of Birth: February 28, 1941  Today's Date: 02/01/2016 PT Individual Time: 0806-0901 PT Individual Time Calculation (min): 55 min    Short Term Goals: Week 3:  PT Short Term Goal 1 (Week 3): = LTGS overall supervision to min assist  Skilled Therapeutic Interventions/Progress Updates:    Pt received in w/c & agreeable to PT, noting 6/10 back & L hip pain & RN notified. Session focused on gait training, stair negotiation & transfers; pt able to complete all tasks at supervision level. Gait training x 100 ft + 200 ft with RW. Stair negotiation x 4 steps (6") with B rails & cuing for compensatory sequencing as pt was unable to independently recall sequencing. Pt continues to require frequent rest breaks 2/2 fatigue. Pt & husband expressed comfort with functional mobility tasks when asked about d/c. Utilized nu-step level 3 x 10 minutes without any rest breaks for endurance training; pt reported 13 on Borg RPE scale during task. Pt with minimal L knee flexion during task & self reported "I'm just so protective of that left leg". At end of session pt left sitting in w/c in room with BLE on ELR's; pt with all needs within reach & husband present.   Therapy Documentation Precautions:  Precautions Precautions: Fall Restrictions Weight Bearing Restrictions: Yes LLE Weight Bearing: Weight bearing as tolerated  Pain: Pain Assessment Pain Assessment: 0-10 Pain Score: 6  Pain Location:  (back & L hip) Pain Intervention(s): RN made aware   See Function Navigator for Current Functional Status.   Therapy/Group: Individual Therapy  Waunita Schooner 02/01/2016, 7:45 AM

## 2016-02-01 NOTE — Progress Notes (Signed)
ANTICOAGULATION CONSULT NOTE - Follow Up Consult  Pharmacy Consult for lovenox Indication: DVT  Allergies  Allergen Reactions  . Hydrocodone     Confusion/sedation    Patient Measurements: Height: 5\' 7"  (170.2 cm) Weight: 135 lb 9.3 oz (61.5 kg) IBW/kg (Calculated) : 61.6 Heparin Dosing Weight:   Vital Signs: Temp: 97.8 F (36.6 C) (07/23 0559) Temp Source: Oral (07/23 0559) BP: 105/55 (07/23 0559) Pulse Rate: 87 (07/23 0559)  Labs:  Recent Labs  01/30/16 0542  HGB 10.2*  HCT 32.7*  PLT 403*    Estimated Creatinine Clearance: 59.9 mL/min (by C-G formula based on SCr of 0.8 mg/dL).   Medications:  Scheduled:  . acetaminophen  650 mg Oral TID WC & HS  . diclofenac sodium  2 g Topical QID  . enoxaparin (LOVENOX) injection  60 mg Subcutaneous Q12H  . feeding supplement (ENSURE ENLIVE)  237 mL Oral BID BM  . Gerhardt's butt cream   Topical BID  . hydrocortisone-pramoxine  1 applicator Rectal BID  . methocarbamol  500 mg Oral QID  . metoprolol tartrate  25 mg Oral TID  . pantoprazole  40 mg Oral Daily  . potassium chloride  20 mEq Oral BID  . tamsulosin  0.4 mg Oral QPC supper   Infusions:    [name] is currently on treatment dose of lovenox.  Last Hgb was 10.2 and Plt 403 K on 7/21.  Goal of Therapy:  Anti-Xa level 0.6-1 units/ml 4hrs after LMWH dose given Monitor platelets by anticoagulation protocol: Yes   Plan:  Continue lovenox 60 mg sq q12h  CBC every 72 hours F/u oral anticoagulation plan eventually.  Uvaldo Rising, BCPS  Clinical Pharmacist Pager [phone]  02/01/2016 9:24 AM

## 2016-02-01 NOTE — Progress Notes (Signed)
Social Work Patient ID: Bonnie Salazar, female   DOB: Sep 12, 1940, 75 y.o.   MRN: [id number]   Spoke with pt to review team conference.  Aware we continue to aim for 7/26 d/c date, however, she still questions if she'll be ready.  Continues to require encouragement but she does appear some brighter in affect.  Continue to follow.  Koty Anctil, LCSW

## 2016-02-02 ENCOUNTER — Inpatient Hospital Stay (HOSPITAL_COMMUNITY): Payer: Medicare HMO | Admitting: Speech Pathology

## 2016-02-02 ENCOUNTER — Inpatient Hospital Stay (HOSPITAL_COMMUNITY): Payer: Medicare HMO | Admitting: Occupational Therapy

## 2016-02-02 ENCOUNTER — Inpatient Hospital Stay (HOSPITAL_COMMUNITY): Payer: Medicare HMO

## 2016-02-02 LAB — BASIC METABOLIC PANEL
ANION GAP: 6 (ref 5–15)
BUN: 6 mg/dL (ref 6–20)
CALCIUM: 9.3 mg/dL (ref 8.9–10.3)
CO2: 28 mmol/L (ref 22–32)
CREATININE: 0.42 mg/dL — AB (ref 0.44–1.00)
Chloride: 104 mmol/L (ref 101–111)
Glucose, Bld: 102 mg/dL — ABNORMAL HIGH (ref 65–99)
Potassium: 4.2 mmol/L (ref 3.5–5.1)
SODIUM: 138 mmol/L (ref 135–145)

## 2016-02-02 LAB — CBC
HCT: 33.9 % — ABNORMAL LOW (ref 36.0–46.0)
HEMOGLOBIN: 10.5 g/dL — AB (ref 12.0–15.0)
MCH: 29.9 pg (ref 26.0–34.0)
MCHC: 31 g/dL (ref 30.0–36.0)
MCV: 96.6 fL (ref 78.0–100.0)
Platelets: 473 10*3/uL — ABNORMAL HIGH (ref 150–400)
RBC: 3.51 MIL/uL — ABNORMAL LOW (ref 3.87–5.11)
RDW: 16.9 % — ABNORMAL HIGH (ref 11.5–15.5)
WBC: 6 10*3/uL (ref 4.0–10.5)

## 2016-02-02 MED ORDER — METOPROLOL TARTRATE 25 MG PO TABS
25.0000 mg | ORAL_TABLET | Freq: Two times a day (BID) | ORAL | Status: DC
  Administered 2016-02-02 – 2016-02-04 (×4): 25 mg via ORAL
  Filled 2016-02-02 (×4): qty 1

## 2016-02-02 NOTE — Progress Notes (Signed)
Speech Language Pathology Daily Session Note  Patient Details  Name: Bonnie Salazar MRN: [id number] Date of Birth: [birth year]  Today's Date: 02/02/2016 SLP Individual Time: 1330-1430 SLP Individual Time Calculation (min): 60 min   Short Term Goals: Week 3: SLP Short Term Goal 1 (Week 3): Pt will utilize external aids to recall new, semi-complex information Mod I SLP Short Term Goal 2 (Week 3): Pt will solve complex problem related to home management tasks with Supervision level verbal cues   Skilled Therapeutic Interventions: Skilled treatment session focused on cognitive goals. SLP facilitated session by providing supervision question cues for anticipatory awareness in regards to generating a list of activities patient can participate in while at home to maximize physical and cognitive activity. Patient also participated in generating a basic schedule/routine to utilize at home in hopes of decreasing anxiety and maximizing problem solving prior to discharge. Patient left upright in recliner with all needs within reach. Continue with current plan of care.   Function:   Cognition Comprehension Comprehension assist level: Understands complex 90% of the time/cues 10% of the time  Expression   Expression assist level: Expresses complex ideas: With no assist  Social Interaction Social Interaction assist level: Interacts appropriately 90% of the time - Needs monitoring or encouragement for participation or interaction.  Problem Solving Problem solving assist level: Solves complex 90% of the time/cues < 10% of the time  Memory Memory assist level: Requires cues to use assistive device;Recognizes or recalls 90% of the time/requires cueing < 10% of the time    Pain No/Denies Pain   Therapy/Group: Individual Therapy  Teresea Donley 02/02/2016, 3:29 PM

## 2016-02-02 NOTE — Progress Notes (Signed)
Occupational Therapy Session Note  Patient Details  Name: Bonnie Salazar MRN: [id number] Date of Birth: 1940-09-30  Today's Date: 02/02/2016 OT Individual Time: 3818-2993 OT Individual Time Calculation (min): 75 min     Short Term Goals:Week 1:  OT Short Term Goal 1 (Week 1): Pt will engage in 5 minutes of functional task before needing rest break secondary to fatigue.  OT Short Term Goal 1 - Progress (Week 1): Not met OT Short Term Goal 2 (Week 1): Pt will perform toilet transfer with mod A in order to increase I with functional transfers.  OT Short Term Goal 2 - Progress (Week 1): Not met OT Short Term Goal 3 (Week 1): Pt will perform UB dressing with set up A in order to increase I with self care.  OT Short Term Goal 3 - Progress (Week 1): Met OT Short Term Goal 4 (Week 1): Pt will perform LB dressing with max A in order to decrease level of assistance with self care. Week 2:  OT Short Term Goal 1 (Week 2): Pt will perform toileting task with min A OT Short Term Goal 2 (Week 2): Pt will dress LB using AE without need for VCs OT Short Term Goal 3 (Week 2): Pt will complete 1 grooming task standing at sink in order to increase functional activity tolerance      Skilled Therapeutic Interventions/Progress Updates:  Pt seen for skilled OT to facilitate functional mobility and activity tolerance with ADLs using AE. Pt initially anxious about going home on Wednesday and how she will do. Spouse stated he is very comfortable with assisting with her mobility and self care as needed and feels he has had adequate education. He opted to go get coffee and requested he stay for her last OT session tomorrow in case of questions.  Pt performed her self care skills extremely well today. Pt only needed S with all mobility and only slight min A to manage reacher to get pants over L leg. Pt was encouraged at end of session with how she accomplished her tasks. Pt was able to stand and ambulate several times  with good endurance. Spouse came back to room and discussed the need for a rubber mat in shower, memory foam mat out of shower, and where to purchase an arm chair to use in bathroom for dressing.  Pt resting in w/c with all needs met.     Therapy Documentation Precautions:  Precautions Precautions: Fall Restrictions Weight Bearing Restrictions: Yes LLE Weight Bearing: Weight bearing as tolerated    Pain: Pain Assessment Pain Assessment: No/denies pain ADL:  See Function Navigator for Current Functional Status.   Therapy/Group: Individual Therapy  [place] 02/02/2016, 12:16 PM

## 2016-02-02 NOTE — Progress Notes (Signed)
Physical Therapy Session Note  Patient Details  Name: Bonnie Salazar MRN: [id number] Date of Birth: 1941-05-23  Today's Date: 02/02/2016 PT Individual Time: 1100-1200 PT Individual Time Calculation (min): 60 min    Short Term Goals: Week 3:  PT Short Term Goal 1 (Week 3): = LTGS overall supervision to min assist  Skilled Therapeutic Interventions/Progress Updates:    Trial with slippers that were bought for her to allow more foot support and determined which was better and wore these during session. Discussed d/c planning with pt and pt's husband and that tomorrow would be final day of therapies reviewing grad day and family education for tomorrow. Gait on unit at overall supervision level with improved gait speed and WB through LLE. Stair negotiation training x 2 reps with L handrail only (min assist needed initially and progressing to close supervision) to simulate community mobility for access to her hairdresser's salon. Nustep for general ROM and strengthening/endurance x 10 min on level 4 with no rest breaks needed today. Educated on elevation and positioning of LLE upon d/c home in home set-up.    Therapy Documentation Precautions:  Precautions Precautions: Fall Restrictions Weight Bearing Restrictions: Yes LLE Weight Bearing: Weight bearing as tolerated  Pain: Premedicated for LLE pain.  See Function Navigator for Current Functional Status.   Therapy/Group: Individual Therapy  [name], PT, DPT  02/02/2016, 12:06 PM

## 2016-02-02 NOTE — Progress Notes (Signed)
[place] PHYSICAL MEDICINE & REHABILITATION     PROGRESS NOTE  Subjective/Complaints:  Had a pretty good weekend. Pain improving. Husband has concerns about lopressor causing bp to be too low,cause fatigue.   ROS: Denies CP, SOB, N/V/D.  Objective: Vital Signs: Blood pressure (!) 117/55, pulse 84, temperature 98 F (36.7 C), temperature source Oral, resp. rate 18, height 5\' 7"  (1.702 m), weight 58.1 kg (128 lb 1.4 oz), SpO2 97 %. No results found.  Recent Labs  02/02/16 0514  WBC 6.0  HGB 10.5*  HCT 33.9*  PLT 473*    Recent Labs  02/02/16 0514  NA 138  K 4.2  CL 104  GLUCOSE 102*  BUN 6  CREATININE 0.42*  CALCIUM 9.3   CBG (last 3)  No results for input(s): GLUCAP in the last 72 hours.  Wt Readings from Last 3 Encounters:  02/02/16 58.1 kg (128 lb 1.4 oz)  01/16/16 63.4 kg (139 lb 12.4 oz)    Physical Exam:  BP (!) 117/55 (BP Location: Right Arm)   Pulse 84   Temp 98 F (36.7 C) (Oral)   Resp 18   Ht 5\' 7"  (1.702 m)   Wt 58.1 kg (128 lb 1.4 oz)   SpO2 97%   BMI 20.06 kg/m  Constitutional: She appears well-developed and well-nourished. NAD.  HENT: Normocephalic and atraumatic.  Eyes: Conjunctivae and EOM are normal.  Cardiovascular: Normal rate and regular rhythm.  Respiratory: Effort normal and breath sounds normal. No stridor. No respiratory distress. She has no wheezes.  GI: + BS. She exhibits distension. There is no tenderness. Belly a little firm Musculoskeletal: She exhibits edema and tenderness. Mild tenderness in left popliteal fossa  Edema LLE 1++ Neurological: She is alert and oriented.  Able to follow commands without difficulty.  Motor: B/l UE: 4+/5 proximal to distal RLE: Hip flexion 4/5, knee extension 4/5, ankle dorsi/plantar flexion 5/5 LLE: Hip flexion 3/5, knee extension 3/5, ankle dorsi/plantar flexion 5/5 Skin: Skin is warm and dry.  Left anterior hip incision healed  Psychiatric: affect more dynamic. Cognition and memory  are normal.   Assessment/Plan: 1. Functional deficits secondary to debility after left hip fracture complicated by arterial bleed, hemorrhagic shock, bilateral PE, PEA, Atrial thrombus, GIB. medical issues. which require 3+ hours per day of interdisciplinary therapy in a comprehensive inpatient rehab setting. Physiatrist is providing close team supervision and 24 hour management of active medical problems listed below. Physiatrist and rehab team continue to assess barriers to discharge/monitor patient progress toward functional and medical goals.  Function:  Bathing Bathing position   Position: Shower  Bathing parts Body parts bathed by patient: Right arm, Left arm, Chest, Abdomen, Front perineal area, Buttocks, Right upper leg, Left upper leg, Right lower leg, Left lower leg Body parts bathed by helper: Back  Bathing assist Assist Level: Supervision or verbal cues      Upper Body Dressing/Undressing Upper body dressing Upper body dressing/undressing activity did not occur: Refused What is the patient wearing?: Pull over shirt/dress     Pull over shirt/dress - Perfomed by patient: Thread/unthread right sleeve, Put head through opening, Pull shirt over trunk, Thread/unthread left sleeve Pull over shirt/dress - Perfomed by helper: Thread/unthread left sleeve        Upper body assist Assist Level: Set up   Set up : To obtain clothing/put away  Lower Body Dressing/Undressing Lower body dressing   What is the patient wearing?: Pants, Non-skid slipper socks     Pants- Performed  by patient: Thread/unthread right pants leg, Thread/unthread left pants leg, Pull pants up/down Pants- Performed by helper: Pull pants up/down Non-skid slipper socks- Performed by patient: Don/doff right sock, Don/doff left sock (Sock aid) Non-skid slipper socks- Performed by helper: Don/doff right sock, Don/doff left sock               TED Hose - Performed by helper: Don/doff right TED hose, Don/doff  left TED hose  Lower body assist Assist for lower body dressing: Touching or steadying assistance (Pt > 75%)   Set up : To obtain clothing/put away  Toileting Toileting Toileting activity did not occur: Safety/medical concerns Toileting steps completed by patient:  (Patient assisted with tasks ) Toileting steps completed by helper: Adjust clothing prior to toileting Toileting Assistive Devices: Grab bar or rail  Toileting assist Assist level: Supervision or verbal cues   Transfers Chair/bed transfer Chair/bed transfer activity did not occur: Refused Chair/bed transfer method: Ambulatory Chair/bed transfer assist level: Supervision or verbal cues Chair/bed transfer assistive device: Armrests, Medical sales representative Ambulation activity did not occur: Refused   Max distance: 200 ft Assist level: Supervision or verbal cues   Wheelchair   Type: Manual Max wheelchair distance: 120' Assist Level: Supervision or verbal cues  Cognition Comprehension Comprehension assist level: Follows complex conversation/direction with no assist  Expression Expression assist level: Expresses complex ideas: With no assist  Social Interaction Social Interaction assist level: Interacts appropriately 90% of the time - Needs monitoring or encouragement for participation or interaction.  Problem Solving Problem solving assist level: Solves basic problems with no assist  Memory Memory assist level: Requires cues to use assistive device    Medical Problem List and Plan: 1. Abnormality of gait, weakness, low endurance secondary to debility after left hip fracture complicated by arterial bleed, hemorrhagic shock, bilateral PE, PEA, Atrial thrombus, GIB. medical issues.  -Cont CIR   -ELOS 7/26 2. Bilateral PE/DVT Prophylaxis/Anticoagulation:  IVC filter in place.   Hx of GIB.   LE U/S showing extensive b/l DVTs  -appreciate Heme recs  -hgb have been stable between 10 and 11 (10.2 today)--no  signs of blood loss  -lovenox 1mg /kg- monitor for expanding pelvic hematoma  -will d/w surgery/heme about changing to oral agent 3. Pain Management: scheduled tylenol. Tramadol prn, heat/ice  -knee pain improving  -continue exercise  4. Mood: Showing activation and humor. LCSW to follow for evaluation and support.  5. Neuropsych: This patient is capable of making decisions on her own behalf. 6. Skin/Wound Care: Routine pressure relief measures. Monitor incision daily for healing.  7. Fluids/Electrolytes/Nutrition: Monitor I/O.   Continue to encourage PO  -now on regular diet 8. RA thrombus: Resolved post thrombolysis  Repeat Echo ordered per Cardiology, reviewed, no thrombus, slightly improved 9. PAF: In NSR on Cardizem, cardiology consulted and diltiazem d/ced, per pt preference.    Husband has concerns about lopressor (bp/fatigue).     -reduce metoprolol to 25mg  BID--may be able to decrease further 10 GIB:H/H stable. Continue PPI.   Will continue to monitor for recurrent melena or hematochezia.  -exam benign 11. ABLA:   Has been treated with multiple units PRBC post arterial hemorrhage/GIB.   Hb 10.5 today. All labs personally reviewed 12. Dysphagia: Tolerating diet advancement. Po intake improving 13. Chronic low back pain: Lumbar films reviewed, 7/7 stable.  Added kpad for local measures 14. Constipation: Resolved    15. UTI  Resolved, re culture if symptomatic  16. Hypokalemia  K+ 4.2  today  Continue daily kdur   -encourage PO. 17.  Freq urination- due to hematoma  -should improve with time  --not sure flomax has helped---stop---should help with hypotension   LOS (Days) 17 A FACE TO FACE EVALUATION WAS PERFORMED  Cecily Lawhorne T 02/02/2016 8:31 AM

## 2016-02-03 ENCOUNTER — Inpatient Hospital Stay (HOSPITAL_COMMUNITY): Payer: Medicare HMO | Admitting: Physical Therapy

## 2016-02-03 ENCOUNTER — Inpatient Hospital Stay (HOSPITAL_COMMUNITY): Payer: Medicare HMO

## 2016-02-03 ENCOUNTER — Inpatient Hospital Stay (HOSPITAL_COMMUNITY): Payer: Medicare HMO | Admitting: Speech Pathology

## 2016-02-03 ENCOUNTER — Inpatient Hospital Stay (HOSPITAL_COMMUNITY): Payer: Medicare HMO | Admitting: Occupational Therapy

## 2016-02-03 MED ORDER — RIVAROXABAN 15 MG PO TABS
15.0000 mg | ORAL_TABLET | Freq: Two times a day (BID) | ORAL | Status: DC
  Administered 2016-02-03 – 2016-02-04 (×3): 15 mg via ORAL
  Filled 2016-02-03 (×5): qty 1

## 2016-02-03 MED ORDER — RIVAROXABAN 20 MG PO TABS: 20.0000 mg | ORAL_TABLET | Freq: Every day | ORAL | Status: DC

## 2016-02-03 NOTE — Progress Notes (Signed)
Physical Therapy Discharge Summary  Patient Details  Name: Bonnie Salazar MRN: [id number] Date of Birth: [birth year]   Patient has met 11 of 11 long term goals due to improved activity tolerance, improved balance, improved postural control, increased strength, increased range of motion, decreased pain, ability to compensate for deficits and functional use of  left lower extremity.  Patient to discharge at a household ambulatory level supervision with RW.Marland Kitchen   Patient's care partner is independent to provide the necessary physical, cognitive and set-up/supervision assistance at discharge.  Reasons goals not met: n/a all goals met at this time  Recommendation:  Patient will benefit from ongoing skilled PT services in home health setting to continue to advance safe functional mobility, address ongoing impairments in ROM, edema, gait, balance, endurance, functional mobility, strength, and minimize fall risk.  Equipment: RW  Reasons for discharge: treatment goals met and discharge from hospital  Patient/family agrees with progress made and goals achieved: Yes  PT Discharge Precautions/Restrictions Precautions Precautions: Fall Restrictions Weight Bearing Restrictions: No LLE Weight Bearing: Weight bearing as tolerated Cognition Safety/Judgment: Appears intact Sensation Sensation Light Touch: Appears Intact Proprioception: Appears Intact Coordination Gross Motor Movements are Fluid and Coordinated:  (LLE still painful and limited) Motor  Motor Motor: Other (comment) (generalized weakness; pain and edema limiting LLE)     Trunk/Postural Assessment  Cervical Assessment Cervical Assessment: Within Functional Limits Thoracic Assessment Thoracic Assessment:  (kyphotic posture) Lumbar Assessment Lumbar Assessment:  (chronic back pain limiting ROM) Postural Control Postural Control: Within Functional Limits  Balance Static Sitting Balance Static Sitting - Level of Assistance: 6:  Modified independent (Device/Increase time) Dynamic Sitting Balance Dynamic Sitting - Level of Assistance: 6: Modified independent (Device/Increase time) Static Standing Balance Static Standing - Level of Assistance: 6: Modified independent (Device/Increase time);5: Stand by assistance Dynamic Standing Balance Dynamic Standing - Level of Assistance: 5: Stand by assistance Extremity Assessment      RLE Assessment RLE Assessment:  (grossly 4-/5) LLE Assessment LLE Assessment: Exceptions to [facility] (3-/5 hip; limited by edema and pain 3+/5 knee and ankle)   See Function Navigator for Current Functional Status.  [name], PT, DPT  Lavone Nian, PT, DPT  02/03/2016, 5:20 PM

## 2016-02-03 NOTE — Progress Notes (Signed)
Physical Therapy Session Note  Patient Details  Name: SHARLETT GRAMLEY MRN: [id number] Date of Birth: [birth year]  Today's Date: 02/03/2016 PT Individual Time: 1105-1200 PT Individual Time Calculation (min): 55 min    Short Term Goals: Week 3:  PT Short Term Goal 1 (Week 3): = LTGS overall supervision to min assist  Skilled Therapeutic Interventions/Progress Updates:    Session focused on grad day activities and family education to prepare for discharge tomorrow. Reviewed car transfers, ramp negotiation with RW, gait over compliant surface with RW, stair negotiation for community mobility, energy conservation, bed mobility on 26" bed height to simulate home, dynamic standing balance and gait activities while dancing to music, and administered TUG. Pt required overall supervision with RW for all mobility demonstrating improved endurance/activity tolerance and decreased cues needed. Pt has made excellent progress  TUG with RW Trial 1 = 25 sec; Trial 2 = 19 sec; Trial 3 = 19 sec Average = 21 sec     Therapy Documentation Precautions:  Precautions Precautions: Fall Restrictions Weight Bearing Restrictions: No LLE Weight Bearing: Weight bearing as tolerated  Pain:  No complaints - ongoing LLE pain.   See Function Navigator for Current Functional Status.   Therapy/Group: Individual Therapy  [name], PT, DPT  02/03/2016, 12:05 PM

## 2016-02-03 NOTE — Progress Notes (Signed)
Occupational Therapy Session Note  Patient Details  Name: Bonnie Salazar MRN: [id number] Date of Birth: 01-19-1941  Today's Date: 02/03/2016 OT Individual Time: [id number] OT Individual Time Calculation (min): 60 min    Short Term Goals:Week 2:  OT Short Term Goal 1 (Week 2): Pt will perform toileting task with min A OT Short Term Goal 2 (Week 2): Pt will dress LB using AE without need for VCs OT Short Term Goal 3 (Week 2): Pt will complete 1 grooming task standing at sink in order to increase functional activity tolerance  Skilled Therapeutic Interventions/Progress Updates:    Pt seen for OT ADL bathing/dressing session with emphasis on family education. Pt sitting on toilet upon arrival with husband present, agreeable to tx session following toileting task. Throughout session, ambulated with RW and supervision. She bathed seated on tub transfer bench, standing with supervision to complete buttock hygiene and LH sponge to assist with LB. She dressed seated in w/c, using reacher to assist with donning pants and shoes. Cont to require some encouragement for participation in more difficulty ADL tasks as pt prefers for husband to assist, however, with encouragement pt able to complete with set-up/ supervision.  Grooming completed mod I statically standing at sink. Pt left sitting in w/c at end of session, all needs in reach and husband present.  Family and pt education throughout session regarding technique for shower stall transfer. Recommended practing home shower transfer during "dry run" once at home and/or to wait until [facility] present to assess as pt's home shower not set up like demo shower on unit. Discussed DME and need for BSC to be used over toilet. Recommended pt use BSC at bedside at night due to urinary frequency and urgency-pt's husband initially reluctant to idea, however, cont to stress importance and decreasing pt's risk of fall.   Therapy Documentation Precautions:   Precautions Precautions: Fall Restrictions Weight Bearing Restrictions: No LLE Weight Bearing: Weight bearing as tolerated Pain:   No/ denies pain  See Function Navigator for Current Functional Status.   Therapy/Group: Individual Therapy  Lewis, Itali Mckendry C 02/03/2016, 7:11 AM

## 2016-02-03 NOTE — Progress Notes (Signed)
Nutrition Follow-up  DOCUMENTATION CODES:   Not applicable  INTERVENTION:  Continue Ensure Enlive po BID, each supplement provides 350 kcal and 20 grams of protein.  Recommend continuation of nutritional supplementation post discharge.   NUTRITION DIAGNOSIS:   Inadequate oral intake related to dysphagia as evidenced by per patient/family report; improved  GOAL:   Patient will meet greater than or equal to 90% of their needs; met  MONITOR:   PO intake, Supplement acceptance, Weight trends, Labs, I & O's  REASON FOR ASSESSMENT:   Malnutrition Screening Tool    ASSESSMENT:   75 y.o. female admitted on 12/25/15 after a fall with subsequent left femoral neck fracture. She underwent left anterior hip arthroplasty by Dr. Fredonia Highland. Post op course complicated by abdominal distension with hypotension due to bleeding from distal iliac artery. Presents with Debility after left hip fracture complicated by arterial bleed, hemorrhagic shock, bilateral PE, PEA, Atrial thrombus, GIB.  Meal completion has been 100%. Intake adequate at meals. Pt has been refusing her Ensure supplements over the past 2 days. Plans for discharge tomorrow. Recommend nutritional supplementation post discharge to aid in caloric and protein needs as well as in prevention of weight loss.   Labs and medications reviewed.   Diet Order:  Diet regular Room service appropriate?: Yes; Fluid consistency:: Thin  Skin:  Wound (see comment) (wound on coccyx, incision on L hip)  Last BM:  7/24  Height:   Ht Readings from Last 1 Encounters:  01/16/16 _0  (1.702 m)    Weight:   Wt Readings from Last 1 Encounters:  02/03/16 126 lb 8.7 oz (57.4 kg)    Ideal Body Weight:  61.36 kg  BMI:  Body mass index is 19.82 kg/m.  Estimated Nutritional Needs:   Kcal:  1600-1800  Protein:  75-85 grams  Fluid:  1.6 - 1.8 L/day  EDUCATION NEEDS:   No education needs identified at this time  Corrin Parker, MS,  RD, LDN Pager # [phone] After hours/ weekend pager # [phone]

## 2016-02-03 NOTE — Progress Notes (Signed)
Occupational Therapy Discharge Summary  Patient Details  Name: Bonnie Salazar MRN: [id number] Date of Birth: 12/14/1940  Patient has met 9 of 9 long term goals due to improved activity tolerance, improved balance, postural control, improved awareness and improved coordination.  Patient to discharge at overall Supervision level.  Patient's care partner is independent to provide the necessary physical assistance at discharge.    All OT goals met. Family training completed with pt's husband with him voicing and demonstrating ability to provide all needed physical assistance at d/c.  Recommending pt keep BSC at bedside during the night due to urinary frequency and urgency in order to reduce risk of fall.   Recommendation:  Patient will benefit from ongoing skilled OT services in home health setting to continue to advance functional skills in the area of BADL, iADL and Reduce care partner burden.  Equipment: BSC, pt's husband privately bought shower chair  Reasons for discharge: treatment goals met and discharge from hospital  Patient/family agrees with progress made and goals achieved: Yes  OT Discharge Precautions/Restrictions  Precautions Precautions: Fall Restrictions Weight Bearing Restrictions: No LLE Weight Bearing: Weight bearing as tolerated Vision/Perception  Vision- History Baseline Vision/History: Wears glasses Wears Glasses: Reading only Patient Visual Report: No change from baseline Vision- Assessment Vision Assessment?: No apparent visual deficits  Cognition Overall Cognitive Status: Within Functional Limits for tasks assessed Arousal/Alertness: Awake/alert Orientation Level: Oriented X4 Sustained Attention: Appears intact Selective Attention: Appears intact Memory: Appears intact Awareness: Appears intact Problem Solving: Appears intact Safety/Judgment: Appears intact Sensation Sensation Light Touch: Appears Intact Proprioception: Appears  Intact Coordination Gross Motor Movements are Fluid and Coordinated: Yes Fine Motor Movements are Fluid and Coordinated: Yes Coordination and Movement Description: Limited by L edema and pain, however, much improved since admission Motor  Motor Motor: Within Functional Limits Motor - Skilled Clinical Observations: Generalized weakness and limited by L LE pain  Trunk/Postural Assessment  Cervical Assessment Cervical Assessment: Within Functional Limits Thoracic Assessment Thoracic Assessment: Exceptions to [facility] (Kyphotic posture) Lumbar Assessment Lumbar Assessment: Within Functional Limits Postural Control Postural Control: Within Functional Limits  Balance Balance Balance Assessed: Yes Static Sitting Balance Static Sitting - Balance Support: Feet supported Static Sitting - Level of Assistance: 6: Modified independent (Device/Increase time) Dynamic Sitting Balance Dynamic Sitting - Balance Support: Feet supported;During functional activity Dynamic Sitting - Level of Assistance: 6: Modified independent (Device/Increase time) Sitting balance - Comments: Sitting to complete bathing task Static Standing Balance Static Standing - Balance Support: During functional activity;Right upper extremity supported;Left upper extremity supported Static Standing - Level of Assistance: 6: Modified independent (Device/Increase time);5: Stand by assistance Static Standing - Comment/# of Minutes: Standing to complete grooming tasks Dynamic Standing Balance Dynamic Standing - Balance Support: Right upper extremity supported;Left upper extremity supported;During functional activity Dynamic Standing - Level of Assistance: 5: Stand by assistance Extremity/Trunk Assessment RUE Assessment RUE Assessment: Within Functional Limits LUE Assessment LUE Assessment: Within Functional Limits   See Function Navigator for Current Functional Status.  Lewis, Kemon Devincenzi C 02/03/2016, 3:01 PM

## 2016-02-03 NOTE — Progress Notes (Signed)
Physical Therapy Session Note  Patient Details  Name: Bonnie Salazar MRN: [id number] Date of Birth: [birth year]  Today's Date: 02/03/2016 PT Individual Time: [id number] PT Individual Time Calculation (min): 58 min    Short Term Goals: Week 3:  PT Short Term Goal 1 (Week 3): = LTGS overall supervision to min assist  Skilled Therapeutic Interventions/Progress Updates:    Pt received in w/c & agreeable to PT, denying c/o pain. [name] present for session & they both voiced comfort regarding d/c home. Gait training room<>solarium with RW & supervision with sitting rest break before returning to unit. Pt able to negotiate various surfaces & thresholds (carpet, tile, elevator) with supervision A. Pt reported need to use restroom and pt able to complete toilet transfers with supervision, (+) void, and supervision while standing for peri-hygiene. Pt propelled w/c mobility room<>gym with BUE for cardiovascular endurance training (100 ft + 100 ft). Utilized nu-step level 4 x 10 minutes with pt reporting 15 on Borg RPE scale for BLE strengthening & endurance training. At end of session pt left sitting in w/c with all needs within reach, set up with meal tray, & husband present.  Therapy Documentation Precautions:  Precautions Precautions: Fall Restrictions Weight Bearing Restrictions: No LLE Weight Bearing: Weight bearing as tolerated   See Function Navigator for Current Functional Status.   Therapy/Group: Individual Therapy  Waunita Schooner 02/03/2016, 12:33 PM

## 2016-02-03 NOTE — Progress Notes (Signed)
Speech Language Pathology Discharge Summary and Final Treatment Note  Patient Details  Name: Bonnie Salazar MRN: [id number] Date of Birth: 30-Oct-1940  Today's Date: 02/03/2016 SLP Individual Time: 8502-7741 SLP Individual Time Calculation (min): 30 min    Skilled Therapeutic Interventions:   Skilled treatment session focused on addressing diagnostic treatment of cognition goals. SLP facilitated session by administering the [facility] Cognitive Assessment, 7.2.  Patient required increased wait time and demonstrated skills consistent with a score of 25/30 with 26 or greater being considered WNL.  Patient Mod I with recall of new information with use of external aids and was able to problem solve complex tasks with extra time and intermittent cues to identify and correct errors. Education completed with patient and spouse for initial Supervision with complex problem solving tasks.        Patient has met 6 of 6 long term goals.  Patient to discharge at overall Modified Independent;Supervision level.  Reasons goals not met: n/a   Clinical Impression/Discharge Summary:    Patient has made functional gains during this rehab admission and has met 6 out of 6 long term goals due to improved functional abilities.  Patient is currently overall Mod I for use of speech and swallow strategies and requires intermittent Supervision assist for self-monitoring and correcting errors with complex cognitive tasks and retrieval of new information.  Patient and family education has been completed. Patient's spouse is supportive and able to assist as needed upon discharge; given that patient will discharge home with 24 hour supervision no skilled SLP services are warranted at this time.  Patient and spouse in agreement that if patient does not return to baseline in a few weeks-a month they will follow up with MD and seek out outpatient follow up SLP services.     Care Partner:  Caregiver Able to Provide Assistance: Yes   Type of Caregiver Assistance: Cognitive  Recommendation:  24 hour supervision/assistance  Rationale for SLP Follow Up: Other (comment) (patient and spouse will seek OP SLP services if needed )   Equipment: none   Reasons for discharge: Treatment goals met;Discharged from hospital   Patient/Family Agrees with Progress Made and Goals Achieved: Yes   Function:  Eating Eating Eating activity did not occur: N/A Modified Consistency Diet: No Eating Assist Level: Swallowing techniques: self managed;Set up assist for   Eating Set Up Assist For: Opening containers       Cognition Comprehension Comprehension assist level: Follows complex conversation/direction with extra time/assistive device  Expression   Expression assist level: Expresses complex ideas: With no assist  Social Interaction Social Interaction assist level: Interacts appropriately 90% of the time - Needs monitoring or encouragement for participation or interaction.  Problem Solving Problem solving assist level: Solves complex 90% of the time/cues < 10% of the time  Memory Memory assist level: Recognizes or recalls 90% of the time/requires cueing < 10% of the time   Carmelia Roller., CCC-SLP [phone]  Bonnie Salazar 02/03/2016, 5:00 PM

## 2016-02-03 NOTE — Progress Notes (Addendum)
[place] PHYSICAL MEDICINE & REHABILITATION     PROGRESS NOTE  Subjective/Complaints:  No new issues. Still frustrated by bladder. Feels more prepared to return home.    ROS: Denies CP, SOB, N/V/D.  Objective: Vital Signs: Blood pressure 128/61, pulse 85, temperature 98.4 F (36.9 C), temperature source Oral, resp. rate 16, height 5\' 7"  (1.702 m), weight 57.4 kg (126 lb 8.7 oz), SpO2 96 %. No results found.  Recent Labs  02/02/16 0514  WBC 6.0  HGB 10.5*  HCT 33.9*  PLT 473*    Recent Labs  02/02/16 0514  NA 138  K 4.2  CL 104  GLUCOSE 102*  BUN 6  CREATININE 0.42*  CALCIUM 9.3   CBG (last 3)  No results for input(s): GLUCAP in the last 72 hours.  Wt Readings from Last 3 Encounters:  02/03/16 57.4 kg (126 lb 8.7 oz)  01/16/16 63.4 kg (139 lb 12.4 oz)    Physical Exam:  BP 128/61 (BP Location: Right Arm)   Pulse 85   Temp 98.4 F (36.9 C) (Oral)   Resp 16   Ht 5\' 7"  (1.702 m)   Wt 57.4 kg (126 lb 8.7 oz)   SpO2 96%   BMI 19.82 kg/m  Constitutional: She appears well-developed and well-nourished. NAD.  HENT: Normocephalic and atraumatic.  Eyes: Conjunctivae and EOM are normal.  Cardiovascular: Normal rate and regular rhythm.  Respiratory: Effort normal and breath sounds normal. No stridor. No respiratory distress. She has no wheezes.  GI: + BS. She exhibits distension. There is no tenderness. Belly a little firm Musculoskeletal: She exhibits edema and tenderness. Mild tenderness in left popliteal fossa  Edema LLE 1++ Neurological: She is alert and oriented.  Able to follow commands without difficulty.  Motor: B/l UE: 4+/5 proximal to distal RLE: Hip flexion 4/5, knee extension 4/5, ankle dorsi/plantar flexion 5/5 LLE: Hip flexion 3/5, knee extension 3/5, ankle dorsi/plantar flexion 5/5 Skin: Skin is warm and dry.  Left anterior hip incision healed  Psychiatric: affect more dynamic. Cognition and memory are normal.   Assessment/Plan: 1.  Functional deficits secondary to debility after left hip fracture complicated by arterial bleed, hemorrhagic shock, bilateral PE, PEA, Atrial thrombus, GIB. medical issues. which require 3+ hours per day of interdisciplinary therapy in a comprehensive inpatient rehab setting. Physiatrist is providing close team supervision and 24 hour management of active medical problems listed below. Physiatrist and rehab team continue to assess barriers to discharge/monitor patient progress toward functional and medical goals.  Function:  Bathing Bathing position   Position: Shower  Bathing parts Body parts bathed by patient: Right arm, Left arm, Chest, Abdomen, Front perineal area, Buttocks, Right upper leg, Left upper leg, Right lower leg, Left lower leg, Back Body parts bathed by helper: Back  Bathing assist Assist Level: Supervision or verbal cues      Upper Body Dressing/Undressing Upper body dressing Upper body dressing/undressing activity did not occur: Refused What is the patient wearing?: Pull over shirt/dress     Pull over shirt/dress - Perfomed by patient: Thread/unthread right sleeve, Put head through opening, Pull shirt over trunk, Thread/unthread left sleeve Pull over shirt/dress - Perfomed by helper: Thread/unthread left sleeve        Upper body assist Assist Level: Set up   Set up : To obtain clothing/put away  Lower Body Dressing/Undressing Lower body dressing   What is the patient wearing?: Pants, Underwear, Shoes Underwear - Performed by patient: Pull underwear up/down, Thread/unthread right underwear leg  Pants- Performed by patient: Thread/unthread right pants leg, Thread/unthread left pants leg, Pull pants up/down Pants- Performed by helper: Pull pants up/down Non-skid slipper socks- Performed by patient: Don/doff right sock, Don/doff left sock (Sock aid) Non-skid slipper socks- Performed by helper: Don/doff right sock, Don/doff left sock     Shoes - Performed by  patient: Don/doff right shoe Shoes - Performed by helper: Don/doff left shoe       TED Hose - Performed by helper: Don/doff right TED hose, Don/doff left TED hose  Lower body assist Assist for lower body dressing: Touching or steadying assistance (Pt > 75%)   Set up : To obtain clothing/put away  Toileting Toileting Toileting activity did not occur: Safety/medical concerns Toileting steps completed by patient: Adjust clothing prior to toileting, Performs perineal hygiene, Adjust clothing after toileting Toileting steps completed by helper: Adjust clothing prior to toileting Toileting Assistive Devices: Grab bar or rail  Toileting assist Assist level: Supervision or verbal cues   Transfers Chair/bed transfer Chair/bed transfer activity did not occur: Refused Chair/bed transfer method: Ambulatory Chair/bed transfer assist level: Supervision or verbal cues Chair/bed transfer assistive device: Armrests, Medical sales representative Ambulation activity did not occur: Refused   Max distance: 150' Assist level: Supervision or verbal cues   Wheelchair   Type: Manual Max wheelchair distance: 120' Assist Level: Supervision or verbal cues  Cognition Comprehension Comprehension assist level: Understands complex 90% of the time/cues 10% of the time  Expression Expression assist level: Expresses complex ideas: With no assist  Social Interaction Social Interaction assist level: Interacts appropriately 90% of the time - Needs monitoring or encouragement for participation or interaction.  Problem Solving Problem solving assist level: Solves complex 90% of the time/cues < 10% of the time  Memory Memory assist level: Requires cues to use assistive device, Recognizes or recalls 90% of the time/requires cueing < 10% of the time    Medical Problem List and Plan: 1. Abnormality of gait, weakness, low endurance secondary to debility after left hip fracture complicated by arterial bleed,  hemorrhagic shock, bilateral PE, PEA, Atrial thrombus, GIB. medical issues.  -Cont CIR   -ELOS 7/26 2. Bilateral PE/DVT Prophylaxis/Anticoagulation:  IVC filter in place.   Hx of GIB.   LE U/S showing extensive b/l DVTs  -appreciate Heme recs  -hgb have been stable between 10 and 11 (10.2 today)--no signs of blood loss  -lovenox 1mg /kg- monitor for expanding pelvic hematoma  -spoke to Dr. Irene Limbo regarding regarding change to xarelto. She has been stable on anticoagulation for 2+ weeks. Ok to change to oral agent. 3. Pain Management: scheduled tylenol. Tramadol prn, heat/ice  -knee pain improving  -continue exercise  4. Mood: Showing activation and humor. LCSW to follow for evaluation and support.  5. Neuropsych: This patient is capable of making decisions on her own behalf. 6. Skin/Wound Care: Routine pressure relief measures. Monitor incision daily for healing.  7. Fluids/Electrolytes/Nutrition: Monitor I/O.   Continue to encourage PO  -now on regular diet 8. RA thrombus: Resolved post thrombolysis  Repeat Echo ordered per Cardiology, reviewed, no thrombus, slightly improved 9. PAF: In NSR on Cardizem, cardiology consulted and diltiazem d/ced, per pt preference.    Husband has concerns about lopressor (bp/fatigue).     -reduced metoprolol to 25mg  BID--may be able to decrease further 10 GIB:H/H stable. Continue PPI.   -no clinical signs of bleeding.  -exam benign 11. ABLA:   Has been treated with multiple units PRBC post arterial hemorrhage/GIB.  Hb 10.5 today. All labs personally reviewed 12. Dysphagia: Tolerating diet advancement. Po intake improving 13. Chronic low back pain: Lumbar films reviewed, 7/7 stable.  Added kpad for local measures 14. Constipation: Resolved    15. UTI  Resolved, re culture if symptomatic  16. Hypokalemia  K+ 4.2 today  Continue daily kdur   -encourage PO. 17.  Freq urination- due to hematoma  -should improve with time  --not sure flomax  has helped---stop---should help with hypotension   LOS (Days) 18 A FACE TO FACE EVALUATION WAS PERFORMED  SWARTZ,ZACHARY T 02/03/2016 8:45 AM

## 2016-02-04 LAB — CBC
HEMATOCRIT: 34.2 % — AB (ref 36.0–46.0)
HEMOGLOBIN: 10.5 g/dL — AB (ref 12.0–15.0)
MCH: 29.7 pg (ref 26.0–34.0)
MCHC: 30.7 g/dL (ref 30.0–36.0)
MCV: 96.6 fL (ref 78.0–100.0)
Platelets: 462 10*3/uL — ABNORMAL HIGH (ref 150–400)
RBC: 3.54 MIL/uL — AB (ref 3.87–5.11)
RDW: 16.6 % — ABNORMAL HIGH (ref 11.5–15.5)
WBC: 6.4 10*3/uL (ref 4.0–10.5)

## 2016-02-04 LAB — COMPREHENSIVE METABOLIC PANEL
ALK PHOS: 321 U/L — AB (ref 38–126)
ALT: 60 U/L — AB (ref 14–54)
AST: 41 U/L (ref 15–41)
Albumin: 2.4 g/dL — ABNORMAL LOW (ref 3.5–5.0)
Anion gap: 9 (ref 5–15)
BILIRUBIN TOTAL: 0.3 mg/dL (ref 0.3–1.2)
CALCIUM: 9.6 mg/dL (ref 8.9–10.3)
CO2: 26 mmol/L (ref 22–32)
CREATININE: 0.45 mg/dL (ref 0.44–1.00)
Chloride: 103 mmol/L (ref 101–111)
Glucose, Bld: 104 mg/dL — ABNORMAL HIGH (ref 65–99)
Potassium: 4.1 mmol/L (ref 3.5–5.1)
Sodium: 138 mmol/L (ref 135–145)
Total Protein: 5.6 g/dL — ABNORMAL LOW (ref 6.5–8.1)

## 2016-02-04 MED ORDER — RIVAROXABAN 15 MG PO TABS: 15.0000 mg | ORAL_TABLET | Freq: Two times a day (BID) | ORAL | 0 refills | Status: DC

## 2016-02-04 MED ORDER — RIVAROXABAN 20 MG PO TABS: 20.0000 mg | ORAL_TABLET | Freq: Every day | ORAL | 0 refills | Status: DC

## 2016-02-04 MED ORDER — METHOCARBAMOL 500 MG PO TABS: 500.0000 mg | ORAL_TABLET | Freq: Four times a day (QID) | ORAL | 0 refills | Status: DC | PRN

## 2016-02-04 MED ORDER — DICLOFENAC SODIUM 1 % TD GEL: 2.0000 g | Freq: Four times a day (QID) | TRANSDERMAL | 0 refills | Status: DC

## 2016-02-04 MED ORDER — POTASSIUM CHLORIDE CRYS ER 20 MEQ PO TBCR: 20.0000 meq | EXTENDED_RELEASE_TABLET | Freq: Two times a day (BID) | ORAL | 0 refills | Status: DC

## 2016-02-04 MED ORDER — METOPROLOL TARTRATE 25 MG PO TABS: 25.0000 mg | ORAL_TABLET | Freq: Two times a day (BID) | ORAL | 0 refills | Status: DC

## 2016-02-04 MED ORDER — TRAMADOL HCL 50 MG PO TABS: 25.0000 mg | ORAL_TABLET | Freq: Four times a day (QID) | ORAL | 0 refills | Status: DC | PRN

## 2016-02-04 MED ORDER — DICLOFENAC SODIUM 1 % TD GEL: 2.0000 g | Freq: Four times a day (QID) | TRANSDERMAL | 0 refills | Status: AC

## 2016-02-04 MED ORDER — PANTOPRAZOLE SODIUM 40 MG PO TBEC: 40.0000 mg | DELAYED_RELEASE_TABLET | Freq: Every day | ORAL | 0 refills | Status: DC

## 2016-02-04 MED ORDER — PANTOPRAZOLE SODIUM 40 MG PO TBEC: 40.0000 mg | DELAYED_RELEASE_TABLET | Freq: Every day | ORAL | 0 refills | Status: AC

## 2016-02-04 NOTE — Progress Notes (Signed)
[place] PHYSICAL MEDICINE & REHABILITATION     PROGRESS NOTE  Subjective/Complaints:  Worked hard in therapy yesterday. Moving better. Edema better LLE. Pain improved. Anxious to get home!    ROS: Denies CP, SOB, N/V/D.  Objective: Vital Signs: Blood pressure 140/65, pulse 97, temperature 98.4 F (36.9 C), temperature source Oral, resp. rate 18, height 5\' 7"  (1.702 m), weight 59.8 kg (131 lb 13.4 oz), SpO2 100 %. No results found.  Recent Labs  02/02/16 0514 02/04/16 0550  WBC 6.0 6.4  HGB 10.5* 10.5*  HCT 33.9* 34.2*  PLT 473* 462*    Recent Labs  02/02/16 0514 02/04/16 0550  NA 138 138  K 4.2 4.1  CL 104 103  GLUCOSE 102* 104*  BUN 6 <5*  CREATININE 0.42* 0.45  CALCIUM 9.3 9.6   CBG (last 3)  No results for input(s): GLUCAP in the last 72 hours.  Wt Readings from Last 3 Encounters:  02/04/16 59.8 kg (131 lb 13.4 oz)  01/16/16 63.4 kg (139 lb 12.4 oz)    Physical Exam:  BP 140/65 (BP Location: Right Arm)   Pulse 97   Temp 98.4 F (36.9 C) (Oral)   Resp 18   Ht 5\' 7"  (1.702 m)   Wt 59.8 kg (131 lb 13.4 oz)   SpO2 100%   BMI 20.65 kg/m  Constitutional: She appears well-developed and well-nourished. NAD.  HENT: Normocephalic and atraumatic.  Eyes: Conjunctivae and EOM are normal.  Cardiovascular: Normal rate and regular rhythm.  Respiratory: Effort normal and breath sounds normal. No stridor. No respiratory distress. She has no wheezes.  GI: + BS. She exhibits distension. There is no tenderness. Belly a little firm Musculoskeletal: She exhibits edema and tenderness. Mild tenderness in left popliteal fossa  Edema LLE 1+--improved Neurological: She is alert and oriented.  Able to follow commands without difficulty.  Motor: B/l UE: 4+/5 proximal to distal RLE: Hip flexion 4/5, knee extension 4/5, ankle dorsi/plantar flexion 5/5 LLE: Hip flexion 3/5, knee extension 3/5, ankle dorsi/plantar flexion 5/5 Skin: Skin is warm and dry.  Left anterior hip  incision healed  Psychiatric: affect more dynamic. Cognition and memory are normal.   Assessment/Plan: 1. Functional deficits secondary to debility after left hip fracture complicated by arterial bleed, hemorrhagic shock, bilateral PE, PEA, Atrial thrombus, GIB. medical issues. which require 3+ hours per day of interdisciplinary therapy in a comprehensive inpatient rehab setting. Physiatrist is providing close team supervision and 24 hour management of active medical problems listed below. Physiatrist and rehab team continue to assess barriers to discharge/monitor patient progress toward functional and medical goals.  Function:  Bathing Bathing position   Position: Shower  Bathing parts Body parts bathed by patient: Right arm, Left arm, Chest, Abdomen, Front perineal area, Buttocks, Right upper leg, Left upper leg, Right lower leg, Left lower leg, Back Body parts bathed by helper: Back  Bathing assist Assist Level: Supervision or verbal cues      Upper Body Dressing/Undressing Upper body dressing Upper body dressing/undressing activity did not occur: Refused What is the patient wearing?: Pull over shirt/dress     Pull over shirt/dress - Perfomed by patient: Thread/unthread right sleeve, Put head through opening, Pull shirt over trunk, Thread/unthread left sleeve Pull over shirt/dress - Perfomed by helper: Thread/unthread left sleeve        Upper body assist Assist Level: Set up   Set up : To obtain clothing/put away  Lower Body Dressing/Undressing Lower body dressing   What is the  patient wearing?: Pants, Shoes Underwear - Performed by patient: Pull underwear up/down, Thread/unthread right underwear leg   Pants- Performed by patient: Thread/unthread right pants leg, Thread/unthread left pants leg, Pull pants up/down Pants- Performed by helper: Pull pants up/down Non-skid slipper socks- Performed by patient: Don/doff right sock, Don/doff left sock (Sock aid) Non-skid slipper  socks- Performed by helper: Don/doff right sock, Don/doff left sock     Shoes - Performed by patient: Don/doff right shoe, Don/doff left shoe Shoes - Performed by helper: Don/doff left shoe       TED Hose - Performed by helper: Don/doff right TED hose, Don/doff left TED hose  Lower body assist Assist for lower body dressing: Supervision or verbal cues   Set up : To obtain clothing/put away  Toileting Toileting Toileting activity did not occur: Safety/medical concerns Toileting steps completed by patient: Adjust clothing prior to toileting, Performs perineal hygiene, Adjust clothing after toileting Toileting steps completed by helper: Adjust clothing prior to toileting [place]: Other (comment) (RW)  Toileting assist Assist level: Supervision or verbal cues   Transfers Chair/bed transfer Chair/bed transfer activity did not occur: Refused Chair/bed transfer method: Ambulatory Chair/bed transfer assist level: Supervision or verbal cues Chair/bed transfer assistive device: Walker, Air cabin crew Ambulation activity did not occur: Refused   Max distance: 300 ft Assist level: Supervision or verbal cues   Wheelchair   Type: Manual Max wheelchair distance: 100 ft Assist Level: Supervision or verbal cues  Cognition Comprehension Comprehension assist level: Follows complex conversation/direction with extra time/assistive device  Expression Expression assist level: Expresses complex ideas: With no assist  Social Interaction Social Interaction assist level: Interacts appropriately 90% of the time - Needs monitoring or encouragement for participation or interaction.  Problem Solving Problem solving assist level: Solves basic 90% of the time/requires cueing < 10% of the time  Memory Memory assist level: Recognizes or recalls 90% of the time/requires cueing < 10% of the time    Medical Problem List and Plan: 1. Abnormality of gait, weakness, low  endurance secondary to debility after left hip fracture complicated by arterial bleed, hemorrhagic shock, bilateral PE, PEA, Atrial thrombus, GIB. medical issues.  -dc home today  -Patient to see me in the office for transitional care encounter in 1-2 weeks.  2. Bilateral PE/DVT Prophylaxis/Anticoagulation:  IVC filter in place.   Hx of GIB.   LE U/S showing extensive b/l DVTs  -appreciate Heme recs  -hgb have been stable between 10 and 11 (10.2 today)--no signs of blood loss  -xarelto initiated. Renal function normal. hgb stable  -LFT's normalizing 3. Pain Management: scheduled tylenol. Tramadol prn, heat/ice  -knee pain improving  -continue exercise  4. Mood: Showing activation and humor. LCSW to follow for evaluation and support.  5. Neuropsych: This patient is capable of making decisions on her own behalf. 6. Skin/Wound Care: Routine pressure relief measures. Monitor incision daily for healing.  7. Fluids/Electrolytes/Nutrition: Monitor I/O.   Continue to encourage PO  -now on regular diet 8. RA thrombus: Resolved post thrombolysis  Repeat Echo ordered per Cardiology, reviewed, no thrombus, slightly improved 9. PAF: In NSR on Cardizem, cardiology consulted and diltiazem d/ced, per pt preference.    Husband has concerns about lopressor (bp/fatigue).     -reduced metoprolol to 25mg  BID--may be able to decrease further 10 GIB:H/H stable. Continue PPI.   -no clinical signs of bleeding.  -exam benign 11. ABLA:   Has been treated with multiple units PRBC post  arterial hemorrhage/GIB.   Hb 10.5 again today. All labs personally reviewed 12. Dysphagia: Tolerating diet advancement. Po intake improving 13. Chronic low back pain: Lumbar films reviewed, 7/7 stable.  Added kpad for local measures 14. Constipation: Resolved    15. UTI  Resolved, re culture if symptomatic  16. Hypokalemia  K+ 4.1 today  Would Continue daily kdur for now  -encourage PO. 17.  Freq urination- due  to hematoma  -should improve with time  --flomax stopped   LOS (Days) 19 A FACE TO FACE EVALUATION WAS PERFORMED  Bonnie Salazar T 02/04/2016 8:30 AM

## 2016-02-04 NOTE — Discharge Instructions (Addendum)
Inpatient Rehab Discharge Instructions  Bonnie Salazar Discharge date and time:  02/04/16  Activities/Precautions/ Functional Status: Activity: no lifting, driving, or strenuous exercise till cleared by MD Diet: cardiac diet Wound Care: Wash with soap and water. Keep wound clean and dry   Functional status:  ___ No restrictions     ___ Walk up steps independently _X__ 24/7 supervision/assistance   ___ Walk up steps with assistance ___ Intermittent supervision/assistance  ___ Bathe/dress independently ___ Walk with walker     _X__ Bathe/dress with assistance ___ Walk Independently    ___ Shower independently ___ Walk with assistance    ___ Shower with assistance _X__ No alcohol     ___ Return to work/school ________     COMMUNITY REFERRALS UPON DISCHARGE:    Home Health:   PT     OT     RN                     Agency:  [facility] Phone: [phone]   Medical Equipment/Items Ordered:  Rolling walker, 3n1 commode                                                      Agency/Supplier: [facility] @ [phone]    Special Instructions:    My questions have been answered and I understand these instructions. I will adhere to these goals and the provided educational materials after my discharge from the hospital.  Patient/Caregiver Signature _______________________________ Date __________  Clinician Signature _______________________________________ Date __________  Please bring this form and your medication list with you to all your follow-up doctor's appointments.   Information on my medicine - XARELTO (rivaroxaban)  This medication education was reviewed with me or my healthcare representative as part of my discharge preparation.   WHY WAS XARELTO PRESCRIBED FOR YOU? Xarelto was prescribed to treat blood clots that may have been found in the veins of your legs (deep vein thrombosis) or in your lungs (pulmonary embolism) and to reduce the risk of them occurring  again.  What do you need to know about Xarelto? The starting dose is one 15 mg tablet taken TWICE daily with food for the FIRST 21 DAYS then on (enter date)  02/24/16  the dose is changed to one 20 mg tablet taken ONCE A DAY with your evening meal.  DO NOT stop taking Xarelto without talking to the health care provider who prescribed the medication.  Refill your prescription for 20 mg tablets before you run out.  After discharge, you should have regular check-up appointments with your healthcare provider that is prescribing your Xarelto.  In the future your dose may need to be changed if your kidney function changes by a significant amount.  What do you do if you miss a dose? If you are taking Xarelto TWICE DAILY and you miss a dose, take it as soon as you remember. You may take two 15 mg tablets (total 30 mg) at the same time then resume your regularly scheduled 15 mg twice daily the next day.  If you are taking Xarelto ONCE DAILY and you miss a dose, take it as soon as you remember on the same day then continue your regularly scheduled once daily regimen the next day. Do not take two doses of Xarelto at the same time.  Important Safety Information Xarelto is a blood thinner medicine that can cause bleeding. You should call your healthcare provider right away if you experience any of the following: ? Bleeding from an injury or your nose that does not stop. ? Unusual colored urine (red or dark brown) or unusual colored stools (red or black). ? Unusual bruising for unknown reasons. ? A serious fall or if you hit your head (even if there is no bleeding).  Some medicines may interact with Xarelto and might increase your risk of bleeding while on Xarelto. To help avoid this, consult your healthcare provider or pharmacist prior to using any new prescription or non-prescription medications, including herbals, vitamins, non-steroidal anti-inflammatory drugs (NSAIDs) and supplements.  This  website has more information on Xarelto: [email or web address]

## 2016-02-04 NOTE — Discharge Summary (Signed)
Physician Discharge Summary  Patient ID: Bonnie Salazar MRN: [id number] DOB/AGE: [birth year] 75 y.o.  Admit date: 01/16/2016 Discharge date: 02/04/2016  Discharge Diagnoses:  Principal Problem:   Debility Active Problems:   Fracture of femoral neck, left (HCC)   Pulmonary embolism without acute cor pulmonale (HCC)   Depression   PAF (paroxysmal atrial fibrillation) (HCC)   Acute blood loss anemia   Chronic low back pain   Slow transit constipation   Edema   Acute lower UTI   Acute bilateral deep vein thrombosis (DVT) of femoral veins ([place])   Urinary retention   Discharged Condition: Stable.   Significant Diagnostic Studies: Dg Lumbar Spine 2-3 Views  Result Date: 01/16/2016 CLINICAL DATA:  Fall in June, left hip fracture, low back pain EXAM: LUMBAR SPINE - 2-3 VIEW COMPARISON:  None. FINDINGS: Evaluation is constrained by difficulty with patient positioning. Five lumbar-type vertebral bodies. Normal lumbar lordosis.  Suspected mild lumbar levoscoliosis. No evidence of fracture or dislocation. Vertebral body heights are maintained. Degenerative changes of the lower lumbar spine. Visualized bony pelvis appears intact. Left hip arthroplasty, incompletely visualized. IVC filter at the L3-4 level. IMPRESSION: No fracture or dislocation is seen. Degenerative changes of the lower lumbar spine. Electronically Signed   By: Julian Hy M.D.   On: 01/16/2016 19:22   Dg Abd 1 View  Result Date: 01/17/2016 CLINICAL DATA:  Acute onset of urinary retention. Initial encounter. EXAM: ABDOMEN - 1 VIEW COMPARISON:  None. FINDINGS: The visualized bowel gas pattern is unremarkable. Scattered air, stool and contrast filled loops of colon are seen; no abnormal dilatation of small bowel loops is seen to suggest small bowel obstruction. No free intra-abdominal air is identified, though evaluation for free air is limited on a single supine view. Mild degenerative change is noted along the lower lumbar  spine. The patient's left hip arthroplasty is grossly unremarkable, though incompletely assessed. An IVC filter is noted. The visualized lung bases are essentially clear. IMPRESSION: Unremarkable bowel gas pattern; no free intra-abdominal air seen. Moderate amount of stool noted in the colon. Electronically Signed   By: Garald Balding M.D.   On: 01/17/2016 02:19   Ct Abdomen Pelvis W Contrast  Result Date: 01/21/2016 CLINICAL DATA:  Lower abdominal pain, swelling, possible small bowel obstruction. EXAM: CT ABDOMEN AND PELVIS WITH CONTRAST TECHNIQUE: Multidetector CT imaging of the abdomen and pelvis was performed using the standard protocol following bolus administration of intravenous contrast. CONTRAST:  150mL ISOVUE-300 IOPAMIDOL (ISOVUE-300) INJECTION 61% COMPARISON:  12/28/2015 FINDINGS: The lung bases are unremarkable. Heart size within normal limits. Enhanced liver is unremarkable. No calcified gallstones are noted within gallbladder. Enhanced pancreas is unremarkable. Enhanced spleen is unremarkable. 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 There is no gastric outlet obstruction. No small bowel obstruction. Atherosclerotic calcifications of abdominal aorta and iliac arteries again noted. No aortic aneurysm. IVC filter in place again noted. Again noted again noted mixed density pelvic hematoma measures 15 x 13.5 cm. The hematoma is extending in anterior lower abdomen about 14.5 cm cranial caudally without significant change from prior exam. Again noted mass effect on anterior aspect of the urinary bladder. 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 There is some heterogeneous low density resolving hematoma within left psoas muscle axial image 64 measures 2.4 cm. Some hematoma is noted in left iliopsoas muscle axial image 68 measures 2.1 cm. There is also resolving hematoma just anterior to hip prosthesis within anterior thigh  muscle on  the left side axial image 95 measures 4.4 cm. There is no evidence of active bleeding or contrast extravasation. Bilateral mild hydronephrosis. No left hydroureter. Mild delay excretion bilateral ureter on delayed images. Mild right hydroureter. No destructive bony lesions are noted within pelvis. Small amount of air within urinary bladder probable post instrumentation. Sagittal images of the spine shows stable degenerative changes lumbar spine. There is no evidence of free abdominal air. No evidence of distal colonic obstruction. No pericecal inflammation. Terminal ileum is unremarkable. Mild anasarca infiltration subcutaneous fat bilateral pelvic wall. IMPRESSION: 1. There is no evidence of small bowel obstruction or colonic obstruction. 2. Again noted mixed density resolving hematoma in lower abdomen and pelvis. The hematoma measures 15 x 13.5 by 14.5 cm. Some old appearing hematoma is noted in left psoas muscle and left iliopsoas muscle. There is old appearing hematoma just anterolateral to left hip joint in left upper thigh muscle axial image 92 measures at least 4.4 cm. No evidence of active bleeding or contrast extravasation. 3. IVC filter in place. 4. Mild bilateral hydronephrosis. Mild right hydroureter. No ureteral calculi are noted bilaterally. 5. No evidence of free abdominal air. 6. 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 Electronically Signed   By: Lahoma Crocker M.D.   On: 01/21/2016 17:08    Labs:  Basic Metabolic Panel: BMP Latest Ref Rng & Units 02/04/2016 02/02/2016 01/27/2016  Glucose 65 - 99 mg/dL 104(H) 102(H) 105(H)  BUN 6 - 20 mg/dL <5(L) 6 5(L)  Creatinine 0.44 - 1.00 mg/dL 0.45 0.42(L) 0.56  Sodium 135 - 145 mmol/L 138 138 137  Potassium 3.5 - 5.1 mmol/L 4.1 4.2 3.7  Chloride 101 - 111 mmol/L 103 104 104  CO2 22 - 32 mmol/L 26 28 27   Calcium 8.9 - 10.3 mg/dL 9.6 9.3 9.2     CBC:  Recent Labs Lab 01/30/16 0542 02/02/16 0514 02/04/16 0550  WBC 9.0  6.0 6.4  HGB 10.2* 10.5* 10.5*  HCT 32.7* 33.9* 34.2*  MCV 96.7 96.6 96.6  PLT 403* 473* 462*    CBG: No results for input(s): GLUCAP in the last 168 hours.   Today's Vitals   02/03/16 2256 02/04/16 0000 02/04/16 0443 02/04/16 1015  BP: (!) 105/51  140/65   Pulse: 87  97   Resp:   18   Temp:   98.4 F (36.9 C)   TempSrc:   Oral   SpO2:   100%   Weight:   59.8 kg (131 lb 13.4 oz)   Height:      PainSc:  Asleep  3     Brief HPI:    TEXIE TAL is a 75 y.o. female admitted on 12/25/15 after a fall with subsequent left femoral neck fracture. She underwent left anterior hip arthroplasty by Dr. Fredonia Highland. Post op course complicated by abdominal distension with hypotension due to bleeding from distal iliac artery. She underwent distal external iliac artery stent down to inguinal ligament. She was treated with 11u PRBC, 7 units FFP, 2 units PLT pheresis and cryoprecipitate for life threatening bleed. Dr. Hulen Skains consulted and recommended monitoring. She tolerated extubation on 06/17 and ortho recommended WBAT with ASA for DVT prophylaxis. BLE dopplers 6/17 were negative for DVT. 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 She developed respiratory distress with marked tachycardia on 06/18 requiring face mask. CTA chest done showing  bilateral PE with completely occlusive thrombus in LLL PA with nearly occlusive segmental pulmonary arteries and right heart strain. Dr. Cyndia Bent consulted and following for input. She underwent catheter lysis of intra-atrial clot by Dr. Earleen Newport. Repeat echo 06/19 revealed that RA thrombus no longer present.   Post procedure developed leg, back and abdominal pain with hypotension requiring neo and was transfused with 2 units PRBC. She was sedated and intubated for airway protection. She developed PEA cardiac arrest later that evening requiring 10 minutes of  resuscitation. CTA abdomen/pelvis showed decrease in extraperitoneal bleed, compression of left external iliac and comon iliac veins by pelvic hematoma, no new pelvic hematoma and enlarging hematoma proximal left thigh. CT head without acute changes. Heparin was discontinued and IVC filter placed on 06/21. Has been treated with antibiotics for fevers as well as IV diuresis for fluid overload. She tolerated extubation on 06/23 and started on dysphagia 1, nectar liquids. She developed bloody stools on 06/26 and Dr. Silverio Decamp consulted and felt that bleeding was due to ischemic colitis v/s hemorrhoids and supportive care recommended.  continue to have DOE but mobility improving and patient remains motivated. CIR recommended for follow up therapy and patient cleared medically for intensive rehab program.     Hospital Course: YEILY KOCUR was admitted to rehab 01/16/2016 for inpatient therapies to consist of PT, ST and OT at least three hours five days a week. Past admission physiatrist, therapy team and rehab RN have worked together to provide customized collaborative inpatient rehab.  She has had improvement in respiratory status as well as overall endurance levels. On 07/09, nursing reported increase in warmth as well as edema and erythema the night before. BLE dopplers were done that am revealing extensive acute thrombosis thoroughout BLE.  She was placed on bed rest and Hem/Onc was consulted for input. Dr. Pixie Casino recommended IV heparin without bolus due to high risk of bleeding with close monitoring of H/H and repeat CT abdomen in 5-7 days. H/H has been stable and repeat CT 7/12 revealed resolving hematoma in lower abdomen and pelvis with old hematoma lateral to left hip joint and mild bilateral hydronephrosis with mild right hydroureter. She was transitioned to treatment dose Lovenox and has tolerated this without complications. H/H has been stable in 10.5 range and she was transitioned to Xarelto on 07/25  per discussion with Dr. Pixie Casino.   She did report intolerance of Cardizem due to excessive lethargy and Dr. Wynonia Lawman was consulted for input on medications. EKG done revealing NSR and repeat  2D echo 7/11 showed improvement in LVEF 55-60%, no thrombus, no wall abnormality and mild to moderate MVR.  Cardizem was discontinued and BB has been titrated for heart rate control. She has tolerated increase in activity level without any cardiac symptoms and husband expressed concerns about BB dose. This was decreased to 25 mg bid on 7/24 and she continues to be in NSR.  Blood pressures have been monitored bid and have been stable.Lumbar films were done due to reports of severe pain and showed degenerative changes in lower lumbar spine. Pain has been managed with scheduled tylenol and tramadol on rare occasions.  She did report tightness left thigh and knee with increase in activity but this has improved and edema LLE is resolving.   Po intake has been good. She did report frequency and urgency with UCS 7/13 showing 60,000 pseudomonas aeruginosa. She did have issues with urinary retention but refused in and out catherization. She was treated with cipro for a  week and Flomax was added with improvement in voiding function.  She has also had issues with hypokalemia requiring additional supplementation. Serial check of lytes shows renal status to be stable and hypokalemia has resolved. She continue on Kdur 20 meq bid at discharge. Abnormal LFTs due to shocked liver are resolving with ALT -60, A phos- 321 and TB- 0.3. She was started on bowel program to help manage constipation and hemorrhoids have improved with use of anusol HC.  She is now continent of bowel and bladder.  Left hip incision is clean, dry and intact with resolution of ecchymosis and decrease in edema.  She has made excellent progress during her rehab stay and will continue to require supervision after discharge. She will continue to receive follow up HHPT, [place] and  HHRN by [facility] after discharge.    Rehab course: During patient's stay in rehab weekly team conferences were held to monitor patient's progress, set goals and discuss barriers to discharge. At admission, patient required max assist with mobility and basic self care tasks. She displayed mild oropharyngeal dysphagia in part due to fatigue and poor endurance. She had mild high level cognitive deficits impacting memory and new complex tasks. She has had improvement in activity tolerance, balance, postural control, awareness and ability to use BLE. She is able to complete ADL tasks with supervision. as well as ability to compensate for deficits. She is able to transfer with  supervision. She is able to ambulate household distances with RW and navigate stairs with supervision.  She is tolerating regular diet without signs or symptoms of aspiration. She is able to utilize speech and swallow strategies independently. She requires supervision for self monitoring, recall and for correcting errors with complex tasks. She has been educated on HEP as well as energy conservation measures.  Husband has been supportive and has been educated on all aspects of physical and cognitive tasks. He will continue to provide supervision after discharge.    Disposition: 01-Home or Self Care  Diet: Regular.   Special Instructions: 1. No driving or strenuous activity till cleared by MD.  2. Needs CBC and BMET rechecked in 7-10 days.       Medication List    STOP taking these medications   aspirin EC 81 MG tablet   diltiazem 30 MG tablet Commonly known as:  CARDIZEM   HYDROcodone-acetaminophen 5-325 MG tablet Commonly known as:  NORCO/VICODIN     TAKE these medications   acetaminophen 325 MG tablet Commonly known as:  TYLENOL Take 2 tablets (650 mg total) by mouth every 6 (six) hours as needed for mild pain.   diclofenac sodium 1 % Gel Commonly known as:  VOLTAREN Apply 2 g topically 4 (four)  times daily.   feeding supplement (ENSURE ENLIVE) Liqd Take 237 mLs by mouth 3 (three) times daily between meals.   fluticasone 50 MCG/ACT nasal spray Commonly known as:  FLONASE Place 2 sprays into the nose daily as needed for allergies.   methocarbamol 500 MG tablet Commonly known as:  ROBAXIN Take 1 tablet (500 mg total) by mouth 4 (four) times daily as needed for muscle spasms. For muscle spasms   metoprolol tartrate 25 MG tablet Commonly known as:  LOPRESSOR Take 1 tablet (25 mg total) by mouth 2 (two) times daily.   multivitamin with minerals Tabs tablet Take 1 tablet by mouth daily.   pantoprazole 40 MG tablet Commonly known as:  PROTONIX Take 1 tablet (40 mg total) by mouth daily.  potassium chloride SA 20 MEQ tablet Commonly known as:  K-DUR,KLOR-CON Take 1 tablet (20 mEq total) by mouth 2 (two) times daily.   Rivaroxaban 15 MG Tabs tablet Commonly known as:  XARELTO Take 1 tablet (15 mg total) by mouth 2 (two) times daily with a meal. Till all the pills are used up. Then transition to once a day   rivaroxaban 20 MG Tabs tablet Commonly known as:  XARELTO Take 1 tablet (20 mg total) by mouth daily with supper. On 02/24/16 Start taking on:  02/24/2016   traMADol 50 MG tablet--Rx #15 pills  Commonly known as:  ULTRAM Take 0.5-1 tablets (25-50 mg total) by mouth every 6 (six) hours as needed for moderate pain.   VITAMIN D PO Take 1 capsule by mouth daily.      Follow-up Information    Meredith Staggers, MD.   Specialty:  Physical Medicine and Rehabilitation Why:  office will call you for follow up appointment Contact information: [place] [address] [phone]        Call today Ezzard Standing, MD.   Specialty:  Cardiology Why:  As needed for follow up Contact information: [place] [address] [phone]        Renette Butters, MD. Call today.   Specialty:  Orthopedic  Surgery Why:  for follow up appointment Contact information: [address] [place] [address] [phone]        Gautam Kale, MD.   Specialties:  Hematology, Oncology Why:  as needed. Contact information: [place] [address] [phone]        KAPLAN,KRISTEN, PA-C Follow up on 02/18/2016.   Specialty:  Family Medicine Why:  @ 10:00 am (hospital follow up) Contact information: [address] [place] [address] [phone]           Signed: Bary Leriche 02/04/2016, 5:11 PM

## 2016-02-04 NOTE — Progress Notes (Signed)
Pt. Got d/c instructions and prescriptions.Medicines has been call to the pharmacy.Pt. Ready to go home with her husband.

## 2016-02-05 NOTE — Progress Notes (Signed)
Social Work  Discharge Note  The overall goal for the admission was met for:   Discharge location: Yes - home with spouse able to provide 24/7 assistance  Length of Stay: Yes  19 days  Discharge activity level: Yes - supervision overall  Home/community participation: Yes  Services provided included: MD, RD, PT, OT, SLP, RN, TR, Pharmacy, Neuropsych and SW  Financial Services: Other: [employer] Medicare  Follow-up services arranged: Home Health: RN, PT, OT via [facility], DME: walker, 3n1 via [place] and Patient/Family has no preference for HH/DME agencies  Comments (or additional information):  Patient/Family verbalized understanding of follow-up arrangements: Yes  Individual responsible for coordination of the follow-up plan: pt  Confirmed correct DME delivered: Donalyn Schneeberger 02/05/2016    Ersel Enslin

## 2016-02-05 NOTE — Patient Care Conference (Signed)
Inpatient RehabilitationTeam Conference and Plan of Care Update Date: 02/03/2016   Time: 2:05 PM    Patient Name: Bonnie Salazar      Medical Record Number: [id number]  Date of Birth: 21-Aug-1940 Sex: Female         Room/Bed: 4M01C/4M01C-01 Payor Info: Payor: [employer] MEDICARE / Plan: [place] MEDICARE HMO/PPO / Product Type: *No Product type* /    Admitting Diagnosis: Lf Hip Fx  Admit Date/Time:  01/16/2016  2:30 PM Admission Comments: No comment available   Primary Diagnosis:  Debility Principal Problem: Debility  Patient Active Problem List   Diagnosis Date Noted  . Urinary retention   . Acute bilateral deep vein thrombosis (DVT) of femoral veins ([place])   . Peritoneal bleeding   . Edema   . Acute lower UTI   . Slow transit constipation   . Debility 01/16/2016  . Pain aggravated by sitting   . Abnormality of gait   . S/p left hip fracture   . Pulmonary embolism without acute cor pulmonale ([place])   . Post-operative pain   . Depression   . Bleeding gastrointestinal   . PAF (paroxysmal atrial fibrillation) ([place])   . Acute blood loss anemia   . Dysphagia   . Chronic low back pain   . Female pelvic hematoma   . Hemorrhage   . Blood per rectum   . Pulmonary embolus, left ([place])   . Pulmonary embolus, right ([place])   . Acute respiratory failure with hypoxia ([place])   . PEA (Pulseless electrical activity) ([place])   . Right atrial thrombus ([place])   . Arterial hemorrhage   . Hypovolemic shock ([place])   . Hypokalemia   . Hypernatremia   . Gastrointestinal hemorrhage with melena   . Acute pulmonary embolus ([place])   . Atrial mass   . Bleeding   . Acute pulmonary embolism ([place]) 12/28/2015  . Atrial thrombus ([place])   . Acute respiratory failure ([place])   . Hemorrhagic shock   . Cardiac arrest ([place])   . Fracture of femoral neck, left ([place]) 12/26/2015  . Hip fracture ([place]) 12/25/2015  . Leukocytosis 12/25/2015  . Dehydration 12/25/2015    Expected Discharge Date: Expected Discharge Date:  02/04/16  Team Members Present: Physician leading conference: Dr. Alger Simons Social Worker Present: Lennart Pall, LCSW Nurse Present: Heather Roberts, RN PT Present: Canary Brim, PT OT Present: Napoleon Form, OT SLP Present: Weston Anna, SLP PPS Coordinator present : Daiva Nakayama, RN, CRRN     Current Status/Progress Goal Weekly Team Focus  Medical   changed to xarelto today to rx dvt. improving stamina and pain. no clinical signs of bleeding  finalize medical issues for dc  see above   Bowel/Bladder   continent during the day, lbm 7/24. incontinent of urine @ night ( wears diaper), she said this will be a concern for her & her husband when she goes home. She said " me & my husband will not be sleeping at night coz I will need my diaper changed many times)  Maintain minimal assistance with toileting, patient will experience less incont bladder episodes.   continue bladder program   Swallow/Nutrition/ Hydration             ADL's   close Supervision with ADL transfers with RW, set up UB self care, min A with LB only to guide reacher or to assist with donning L slipper  Supervision- min A overall  family education with spouse, ADL retraining with AE/DME, functional endurance/activity  tolerance   Mobility   supervision to steady assist overall  supervision to min assist overall  finalize d/c planning, endurance, family education, general strengthening   Communication             Safety/Cognition/ Behavioral Observations            Pain   scheduled tylenol for left leg pain effective  Maintain pain level at zero to 3, or  as tolerable   assess & offer pain meds as ordered   Skin   masd to buttock, incision healed to left hip   Remain free of infection, continue to use proper hand hygiene, and keep patient dry   monitor skin qshift    Rehab Goals Patient on target to meet rehab goals: Yes *See Care Plan and progress notes for long and short-term goals.  Barriers to  Discharge: fatigue/weakness/bladder    Possible Resolutions to Barriers:  timed voids, strength and stamina training, family ed, equipment    Discharge Planning/Teaching Needs:  Plan to d/c home with husband able to provide 24/7 assistance.  TEaching has been ongoing with husband.   Team Discussion:  No concerns.  REady for d/c tomorrow.  Revisions to Treatment Plan:  NA   Continued Need for Acute Rehabilitation Level of Care: The patient requires daily medical management by a physician with specialized training in physical medicine and rehabilitation for the following conditions: Daily medical management of patient stability for increased activity during participation in an intensive rehabilitation regime.: Yes Daily analysis of laboratory values and/or radiology reports with any subsequent need for medication adjustment of medical intervention for : Neurological problems;Post surgical problems;Blood pressure problems;Nutritional problems  Bonnie Salazar 02/05/2016, 11:52 AM

## 2016-02-06 ENCOUNTER — Telehealth: Payer: Self-pay | Admitting: *Deleted

## 2016-02-06 DIAGNOSIS — S72002D Fracture of unspecified part of neck of left femur, subsequent encounter for closed fracture with routine healing: Secondary | ICD-10-CM | POA: Diagnosis not present

## 2016-02-06 DIAGNOSIS — I480 Paroxysmal atrial fibrillation: Secondary | ICD-10-CM | POA: Diagnosis not present

## 2016-02-06 DIAGNOSIS — Z7901 Long term (current) use of anticoagulants: Secondary | ICD-10-CM | POA: Diagnosis not present

## 2016-02-06 DIAGNOSIS — I82413 Acute embolism and thrombosis of femoral vein, bilateral: Secondary | ICD-10-CM | POA: Diagnosis not present

## 2016-02-06 DIAGNOSIS — R69 Illness, unspecified: Secondary | ICD-10-CM | POA: Diagnosis not present

## 2016-02-06 DIAGNOSIS — R339 Retention of urine, unspecified: Secondary | ICD-10-CM | POA: Diagnosis not present

## 2016-02-06 DIAGNOSIS — I2699 Other pulmonary embolism without acute cor pulmonale: Secondary | ICD-10-CM | POA: Diagnosis not present

## 2016-02-06 DIAGNOSIS — M545 Low back pain: Secondary | ICD-10-CM | POA: Diagnosis not present

## 2016-02-06 DIAGNOSIS — G8929 Other chronic pain: Secondary | ICD-10-CM | POA: Diagnosis not present

## 2016-02-06 DIAGNOSIS — R1312 Dysphagia, oropharyngeal phase: Secondary | ICD-10-CM | POA: Diagnosis not present

## 2016-02-06 NOTE — Telephone Encounter (Signed)
Contacted patient for transitional care, pt's caregiver said they were already contacted and confirmed for appt.

## 2016-02-09 ENCOUNTER — Telehealth: Payer: Self-pay | Admitting: Physical Medicine & Rehabilitation

## 2016-02-09 NOTE — Telephone Encounter (Signed)
Bonnie Salazar PT with [facility] would like to get verbal order for patient 3w1; 2w3.  Please call him at [phone].

## 2016-02-09 NOTE — Telephone Encounter (Signed)
Spoke with Cecille Rubin, approved verbal orders per office protocol.

## 2016-02-09 NOTE — Telephone Encounter (Signed)
Left message to call office back to approve verbal orders.

## 2016-02-09 NOTE — Telephone Encounter (Signed)
Returned [employer].  Please leave verbal orders on his phone if he doesn't answer.

## 2016-02-09 NOTE — Telephone Encounter (Signed)
Grayland Ormond RN with [facility] needs to get verbal orders for 1w4.  Please call her at [phone].

## 2016-02-09 NOTE — Telephone Encounter (Signed)
Spoke with Elta [place], approved verbal orders per office protocol.

## 2016-02-10 DIAGNOSIS — R339 Retention of urine, unspecified: Secondary | ICD-10-CM | POA: Diagnosis not present

## 2016-02-10 DIAGNOSIS — R1312 Dysphagia, oropharyngeal phase: Secondary | ICD-10-CM | POA: Diagnosis not present

## 2016-02-10 DIAGNOSIS — S72002D Fracture of unspecified part of neck of left femur, subsequent encounter for closed fracture with routine healing: Secondary | ICD-10-CM | POA: Diagnosis not present

## 2016-02-10 DIAGNOSIS — I82413 Acute embolism and thrombosis of femoral vein, bilateral: Secondary | ICD-10-CM | POA: Diagnosis not present

## 2016-02-10 DIAGNOSIS — Z7901 Long term (current) use of anticoagulants: Secondary | ICD-10-CM | POA: Diagnosis not present

## 2016-02-10 DIAGNOSIS — G8929 Other chronic pain: Secondary | ICD-10-CM | POA: Diagnosis not present

## 2016-02-10 DIAGNOSIS — R69 Illness, unspecified: Secondary | ICD-10-CM | POA: Diagnosis not present

## 2016-02-10 DIAGNOSIS — I2699 Other pulmonary embolism without acute cor pulmonale: Secondary | ICD-10-CM | POA: Diagnosis not present

## 2016-02-10 DIAGNOSIS — M545 Low back pain: Secondary | ICD-10-CM | POA: Diagnosis not present

## 2016-02-10 DIAGNOSIS — I480 Paroxysmal atrial fibrillation: Secondary | ICD-10-CM | POA: Diagnosis not present

## 2016-02-10 NOTE — Procedures (Signed)
OGT Tube Placement By Physician  Post intubation and under direct laryngoscopy, OGT was placed and auscultated via air.  Abd X-ray pending.  Rush Farmer, M.D. [facility] Pulmonary/Critical Care Medicine. Pager: [phone]. After hours pager: [phone].

## 2016-02-11 ENCOUNTER — Encounter: Payer: Self-pay | Admitting: Physical Medicine & Rehabilitation

## 2016-02-11 ENCOUNTER — Telehealth: Payer: Self-pay

## 2016-02-11 ENCOUNTER — Encounter: Payer: Medicare HMO | Attending: Physical Medicine & Rehabilitation | Admitting: Physical Medicine & Rehabilitation

## 2016-02-11 VITALS — BP 98/62 | HR 89

## 2016-02-11 DIAGNOSIS — N9489 Other specified conditions associated with female genital organs and menstrual cycle: Secondary | ICD-10-CM | POA: Diagnosis not present

## 2016-02-11 DIAGNOSIS — E876 Hypokalemia: Secondary | ICD-10-CM | POA: Insufficient documentation

## 2016-02-11 DIAGNOSIS — M25552 Pain in left hip: Secondary | ICD-10-CM | POA: Diagnosis not present

## 2016-02-11 DIAGNOSIS — G8929 Other chronic pain: Secondary | ICD-10-CM | POA: Insufficient documentation

## 2016-02-11 DIAGNOSIS — R5381 Other malaise: Secondary | ICD-10-CM

## 2016-02-11 DIAGNOSIS — D62 Acute posthemorrhagic anemia: Secondary | ICD-10-CM

## 2016-02-11 DIAGNOSIS — Z86718 Personal history of other venous thrombosis and embolism: Secondary | ICD-10-CM | POA: Diagnosis not present

## 2016-02-11 DIAGNOSIS — K640 First degree hemorrhoids: Secondary | ICD-10-CM

## 2016-02-11 DIAGNOSIS — R269 Unspecified abnormalities of gait and mobility: Secondary | ICD-10-CM | POA: Insufficient documentation

## 2016-02-11 DIAGNOSIS — R350 Frequency of micturition: Secondary | ICD-10-CM | POA: Insufficient documentation

## 2016-02-11 DIAGNOSIS — I2699 Other pulmonary embolism without acute cor pulmonale: Secondary | ICD-10-CM

## 2016-02-11 DIAGNOSIS — S72002S Fracture of unspecified part of neck of left femur, sequela: Secondary | ICD-10-CM | POA: Diagnosis not present

## 2016-02-11 DIAGNOSIS — R531 Weakness: Secondary | ICD-10-CM | POA: Diagnosis not present

## 2016-02-11 DIAGNOSIS — M79652 Pain in left thigh: Secondary | ICD-10-CM | POA: Insufficient documentation

## 2016-02-11 MED ORDER — TRAMADOL HCL 50 MG PO TABS: 25.0000 mg | ORAL_TABLET | Freq: Four times a day (QID) | ORAL | 0 refills | Status: DC | PRN

## 2016-02-11 MED ORDER — HYDROCORTISONE ACE-PRAMOXINE 1-1 % RE FOAM: 1.0000 | Freq: Two times a day (BID) | RECTAL | 3 refills | Status: DC

## 2016-02-11 NOTE — Patient Instructions (Signed)
DECREASE POTASSIUM TO ONCE DAILY FOR 2 WEEKS THEN STOP   DECREASE METOPROLOL TO ONCE DAILY FOR  1 WEEK THEN STOP   STRETCHED 1. CROSS LEG STRETCH---LEFT OVER THE RIGHT LEG WHILE PUSHING ON THE LEFT KNEE 2. LYING SUPINE-- BRING UP THE LEFT KNEE TOWARD TO THE CHEST.   ICE 3-4 X PER DAY TO THE LEFT THIGH/KNEE VOLTAREN GEL  KEEP WALKING!!!

## 2016-02-11 NOTE — Telephone Encounter (Signed)
May have tramadol

## 2016-02-11 NOTE — Telephone Encounter (Signed)
Pt was just seen by [facility] today. She is asking for a refill for Tramadol. I see from your note that it is PRN. Is it okay to call in as is, or would you like to adjust sig? Please advise. Thanks!

## 2016-02-11 NOTE — Progress Notes (Signed)
Subjective:    Patient ID: Bonnie Salazar, female    DOB: 12/03/1940, 74 y.o.   MRN: [id number]     TRANSITIONAL CARE VISIT  HPI   Bonnie Salazar is here for a transitional care visit. She has been home from rehab about a week and has been doing well with therapy except for pain in her lateral thigh. The pain starts in her lateral knee and runs up to the lateral hip. Tylenol and muscle relaxant helps at night when she rests. Heat helps a bit. Voltaren gel doesn't help much. She finds that the leg is often more painful at rest than when she ambulates. Therapy has been addressing on their initial visit. She asked if she can take an anti-inflammatory. She hasn't filled the tramadol  Bladder is still active and causing her void frequently. She is up a lot at night too---1-2 hours at night. Her bowels have been more regular.  BP still runs a bit low although she denies any dizziness or tachycardia. Appetite is improving slowly.   Her mood has been positive. She's happy to be home. Husband remains supportive.      Pain Inventory Average Pain 7 Pain Right Now 6 My pain is sharp  In the last 24 hours, has pain interfered with the following? General activity 7 Relation with others 7 Enjoyment of life 7 What TIME of day is your pain at its worst? all Sleep (in general) Poor  Pain is worse with: sitting Pain improves with: therapy/exercise, pacing activities and other Relief from Meds: 1  Mobility walk with assistance use a walker how many minutes can you walk? 10 ability to climb steps?  yes do you drive?  no needs help with transfers Do you have any goals in this area?  yes  Function retired I need assistance with the following:  toileting and household duties  Neuro/Psych bladder control problems trouble walking depression  Prior Studies hospital f/u  Physicians involved in your care hospital f/u   Family History  Problem Relation Age of Onset  . Hodgkin's  lymphoma Mother   . Stroke Father   . Coronary artery disease Father     in his late 54s   Social History   Social History  . Marital status: Married    Spouse name: N/A  . Number of children: N/A  . Years of education: N/A   Occupational History  . Retired Estate manager/land agent    Social History Main Topics  . Smoking status: Former Research scientist (life sciences)  . Smokeless tobacco: Former Systems developer    Quit date: 12/11/1995  . Alcohol use No  . Drug use: No  . Sexual activity: Not Asked   Other Topics Concern  . None   Social History Narrative   Pt lives with her husband, walks daily.   Past Surgical History:  Procedure Laterality Date  . BACK SURGERY    . IVC FILTER PLACEMENT ([facility] HX)    . 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 Acute pulmonary embolism ([place]) 12/28/2015  . Acute respiratory failure with hypoxia ([place]) 01/2016  . Arterial hemorrhage 01/2016  . Atrial mass 01/2016  . Cardiac arrest ([place]) 12/2015  . Female pelvic hematoma 01/2016  . Fracture of femoral neck, left ([place]) 01/2016  . Hypernatremia 01/2016  . Right  atrial thrombus ([place]) 01/2016   BP 98/62 (BP Location: Left Arm, Patient Position: Sitting, Cuff Size: Normal)   Pulse 89   SpO2 96%   Opioid Risk Score:   Fall Risk Score:  `1  Depression screen PHQ 2/9  Depression screen PHQ 2/9 02/11/2016  Decreased Interest 0  Down, Depressed, Hopeless 0  PHQ - 2 Score 0  Altered sleeping 3  Tired, decreased energy 0  Change in appetite 0  Feeling bad or failure about yourself  0  Trouble concentrating 0  Moving slowly or fidgety/restless 0  Suicidal thoughts 0  PHQ-9 Score 3    Review of Systems  Constitutional: Negative.   HENT: Negative.   Eyes: Negative.   Respiratory: Negative.   Cardiovascular: Negative.   Endocrine: Negative.     Genitourinary: Positive for frequency.  Musculoskeletal: Positive for gait problem.  Allergic/Immunologic: Negative.   Psychiatric/Behavioral: Positive for dysphoric mood.  All other systems reviewed and are negative.      Objective:   Physical Exam BP 98/62 (BP Location: Left Arm, Patient Position: Sitting, Cuff Size: Normal)   Pulse 89   SpO2 96% \ Constitutional: She appears well-developed and well-nourished. NAD.  HENT: Normocephalic and atraumatic.  Eyes: Conjunctivae and EOM are normal.  Cardiovascular: Normal rate and regular rhythm. no MRG Respiratory: Effort normal and breath sounds normal. No stridor. No respiratory distress. She has no wheezes.  GI: + BS.  Musculoskeletal: She exhibits edema and tenderness at the left femoral condyle and fibula. Left lateral thigh painful proximally to greater troch. More painful with cross leg maneuver and left hip flexion Edema trace at most LLE Neurological: She is alert and oriented.  Ambuilates with mild antalgia LLE  Motor: B/l UE: 4+/5 proximal to distal RLE: Hip flexion 4/5, knee extension 4/5, ankle dorsi/plantar flexion 5/5 LLE: Hip flexion 3-/5, knee extension 3+/5, ankle dorsi/plantar flexion 5/5 Skin: Skin is warm and dry.  Left anterior hip incision healed  Psychiatric: affect more dynamic. Cognition and memory are normal.       Assessment & Plan:  Medical Problem List and Plan: 1. Abnormality of gait, weakness, low endurance secondary to debility after left hip fracture complicated by arterial bleed, hemorrhagic shock, bilateral PE, PEA, Atrial thrombus, GIB. medical issues.                       -continue HH therapies  2. Bilateral PE/DVT:                       IVC filter in place.                        Hx of GIB.                                        -xarelto over the next 1-3 months at least                        3. 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 Tramadol prn for severe pain----at least at night                       -regular ice   -TFL stretches were provided                      -  continue exercise  4. Fluids/Electrolytes/Nutrition: Monitor I/O.                        Continue to encourage PO                       -now on regular diet 5. RA thrombus: Resolved post thrombolysis                       Repeat Echo ordered per Cardiology, reviewed, no thrombus, slightly improved 9. PAF: In NSR --wean off metoprolol as bp's continue to run low. If HR increases, she is instructed to call me.  10 GIB:H/H stable. Continue PPI.                        -no clinical signs of bleeding.                       -follow up bloodwork potentially at next visit or as symptoms dictate 11. ABLA:                        Has been treated with multiple units PRBC post arterial hemorrhage/GIB.                      14. Constipation: Resolved                            16. Hypokalemia                       --daily kdur for 2 weeks then stop 17.  Freq urination- due to hematoma                       -continue voiding schedule   -should continue to improve with time   Thirty minutes+ of face to face patient care time were spent during this visit. All questions were encouraged and answered. Follow up in one month.

## 2016-02-12 DIAGNOSIS — S72002D Fracture of unspecified part of neck of left femur, subsequent encounter for closed fracture with routine healing: Secondary | ICD-10-CM | POA: Diagnosis not present

## 2016-02-12 DIAGNOSIS — I82413 Acute embolism and thrombosis of femoral vein, bilateral: Secondary | ICD-10-CM | POA: Diagnosis not present

## 2016-02-12 DIAGNOSIS — I480 Paroxysmal atrial fibrillation: Secondary | ICD-10-CM | POA: Diagnosis not present

## 2016-02-12 DIAGNOSIS — R1312 Dysphagia, oropharyngeal phase: Secondary | ICD-10-CM | POA: Diagnosis not present

## 2016-02-12 DIAGNOSIS — R339 Retention of urine, unspecified: Secondary | ICD-10-CM | POA: Diagnosis not present

## 2016-02-12 DIAGNOSIS — G8929 Other chronic pain: Secondary | ICD-10-CM | POA: Diagnosis not present

## 2016-02-12 DIAGNOSIS — R69 Illness, unspecified: Secondary | ICD-10-CM | POA: Diagnosis not present

## 2016-02-12 DIAGNOSIS — M545 Low back pain: Secondary | ICD-10-CM | POA: Diagnosis not present

## 2016-02-12 DIAGNOSIS — Z7901 Long term (current) use of anticoagulants: Secondary | ICD-10-CM | POA: Diagnosis not present

## 2016-02-12 DIAGNOSIS — I2699 Other pulmonary embolism without acute cor pulmonale: Secondary | ICD-10-CM | POA: Diagnosis not present

## 2016-02-13 DIAGNOSIS — M545 Low back pain: Secondary | ICD-10-CM | POA: Diagnosis not present

## 2016-02-13 DIAGNOSIS — S72002D Fracture of unspecified part of neck of left femur, subsequent encounter for closed fracture with routine healing: Secondary | ICD-10-CM | POA: Diagnosis not present

## 2016-02-13 DIAGNOSIS — Z7901 Long term (current) use of anticoagulants: Secondary | ICD-10-CM | POA: Diagnosis not present

## 2016-02-13 DIAGNOSIS — R69 Illness, unspecified: Secondary | ICD-10-CM | POA: Diagnosis not present

## 2016-02-13 DIAGNOSIS — R339 Retention of urine, unspecified: Secondary | ICD-10-CM | POA: Diagnosis not present

## 2016-02-13 DIAGNOSIS — R1312 Dysphagia, oropharyngeal phase: Secondary | ICD-10-CM | POA: Diagnosis not present

## 2016-02-13 DIAGNOSIS — I480 Paroxysmal atrial fibrillation: Secondary | ICD-10-CM | POA: Diagnosis not present

## 2016-02-13 DIAGNOSIS — I82413 Acute embolism and thrombosis of femoral vein, bilateral: Secondary | ICD-10-CM | POA: Diagnosis not present

## 2016-02-13 DIAGNOSIS — I2699 Other pulmonary embolism without acute cor pulmonale: Secondary | ICD-10-CM | POA: Diagnosis not present

## 2016-02-13 DIAGNOSIS — G8929 Other chronic pain: Secondary | ICD-10-CM | POA: Diagnosis not present

## 2016-02-17 DIAGNOSIS — M545 Low back pain: Secondary | ICD-10-CM | POA: Diagnosis not present

## 2016-02-17 DIAGNOSIS — I480 Paroxysmal atrial fibrillation: Secondary | ICD-10-CM | POA: Diagnosis not present

## 2016-02-17 DIAGNOSIS — R1312 Dysphagia, oropharyngeal phase: Secondary | ICD-10-CM | POA: Diagnosis not present

## 2016-02-17 DIAGNOSIS — R339 Retention of urine, unspecified: Secondary | ICD-10-CM | POA: Diagnosis not present

## 2016-02-17 DIAGNOSIS — R69 Illness, unspecified: Secondary | ICD-10-CM | POA: Diagnosis not present

## 2016-02-17 DIAGNOSIS — I82413 Acute embolism and thrombosis of femoral vein, bilateral: Secondary | ICD-10-CM | POA: Diagnosis not present

## 2016-02-17 DIAGNOSIS — Z7901 Long term (current) use of anticoagulants: Secondary | ICD-10-CM | POA: Diagnosis not present

## 2016-02-17 DIAGNOSIS — I2699 Other pulmonary embolism without acute cor pulmonale: Secondary | ICD-10-CM | POA: Diagnosis not present

## 2016-02-17 DIAGNOSIS — S72002D Fracture of unspecified part of neck of left femur, subsequent encounter for closed fracture with routine healing: Secondary | ICD-10-CM | POA: Diagnosis not present

## 2016-02-17 DIAGNOSIS — G8929 Other chronic pain: Secondary | ICD-10-CM | POA: Diagnosis not present

## 2016-02-20 DIAGNOSIS — S72002D Fracture of unspecified part of neck of left femur, subsequent encounter for closed fracture with routine healing: Secondary | ICD-10-CM | POA: Diagnosis not present

## 2016-02-20 DIAGNOSIS — M545 Low back pain: Secondary | ICD-10-CM | POA: Diagnosis not present

## 2016-02-20 DIAGNOSIS — I2699 Other pulmonary embolism without acute cor pulmonale: Secondary | ICD-10-CM | POA: Diagnosis not present

## 2016-02-20 DIAGNOSIS — R69 Illness, unspecified: Secondary | ICD-10-CM | POA: Diagnosis not present

## 2016-02-20 DIAGNOSIS — I82413 Acute embolism and thrombosis of femoral vein, bilateral: Secondary | ICD-10-CM | POA: Diagnosis not present

## 2016-02-20 DIAGNOSIS — Z7901 Long term (current) use of anticoagulants: Secondary | ICD-10-CM | POA: Diagnosis not present

## 2016-02-20 DIAGNOSIS — G8929 Other chronic pain: Secondary | ICD-10-CM | POA: Diagnosis not present

## 2016-02-20 DIAGNOSIS — R1312 Dysphagia, oropharyngeal phase: Secondary | ICD-10-CM | POA: Diagnosis not present

## 2016-02-20 DIAGNOSIS — I480 Paroxysmal atrial fibrillation: Secondary | ICD-10-CM | POA: Diagnosis not present

## 2016-02-20 DIAGNOSIS — R339 Retention of urine, unspecified: Secondary | ICD-10-CM | POA: Diagnosis not present

## 2016-02-24 DIAGNOSIS — Z7901 Long term (current) use of anticoagulants: Secondary | ICD-10-CM | POA: Diagnosis not present

## 2016-02-24 DIAGNOSIS — G8929 Other chronic pain: Secondary | ICD-10-CM | POA: Diagnosis not present

## 2016-02-24 DIAGNOSIS — I2699 Other pulmonary embolism without acute cor pulmonale: Secondary | ICD-10-CM | POA: Diagnosis not present

## 2016-02-24 DIAGNOSIS — I82413 Acute embolism and thrombosis of femoral vein, bilateral: Secondary | ICD-10-CM | POA: Diagnosis not present

## 2016-02-24 DIAGNOSIS — S72002D Fracture of unspecified part of neck of left femur, subsequent encounter for closed fracture with routine healing: Secondary | ICD-10-CM | POA: Diagnosis not present

## 2016-02-24 DIAGNOSIS — M545 Low back pain: Secondary | ICD-10-CM | POA: Diagnosis not present

## 2016-02-24 DIAGNOSIS — I480 Paroxysmal atrial fibrillation: Secondary | ICD-10-CM | POA: Diagnosis not present

## 2016-02-24 DIAGNOSIS — R1312 Dysphagia, oropharyngeal phase: Secondary | ICD-10-CM | POA: Diagnosis not present

## 2016-02-24 DIAGNOSIS — R69 Illness, unspecified: Secondary | ICD-10-CM | POA: Diagnosis not present

## 2016-02-24 DIAGNOSIS — R339 Retention of urine, unspecified: Secondary | ICD-10-CM | POA: Diagnosis not present

## 2016-02-25 DIAGNOSIS — S72002D Fracture of unspecified part of neck of left femur, subsequent encounter for closed fracture with routine healing: Secondary | ICD-10-CM | POA: Diagnosis not present

## 2016-02-25 DIAGNOSIS — R69 Illness, unspecified: Secondary | ICD-10-CM | POA: Diagnosis not present

## 2016-02-25 DIAGNOSIS — M545 Low back pain: Secondary | ICD-10-CM | POA: Diagnosis not present

## 2016-02-25 DIAGNOSIS — R339 Retention of urine, unspecified: Secondary | ICD-10-CM | POA: Diagnosis not present

## 2016-02-25 DIAGNOSIS — I82413 Acute embolism and thrombosis of femoral vein, bilateral: Secondary | ICD-10-CM | POA: Diagnosis not present

## 2016-02-25 DIAGNOSIS — R1312 Dysphagia, oropharyngeal phase: Secondary | ICD-10-CM | POA: Diagnosis not present

## 2016-02-25 DIAGNOSIS — I480 Paroxysmal atrial fibrillation: Secondary | ICD-10-CM | POA: Diagnosis not present

## 2016-02-25 DIAGNOSIS — Z7901 Long term (current) use of anticoagulants: Secondary | ICD-10-CM | POA: Diagnosis not present

## 2016-02-25 DIAGNOSIS — I2699 Other pulmonary embolism without acute cor pulmonale: Secondary | ICD-10-CM | POA: Diagnosis not present

## 2016-02-25 DIAGNOSIS — G8929 Other chronic pain: Secondary | ICD-10-CM | POA: Diagnosis not present

## 2016-02-26 DIAGNOSIS — I82413 Acute embolism and thrombosis of femoral vein, bilateral: Secondary | ICD-10-CM | POA: Diagnosis not present

## 2016-02-26 DIAGNOSIS — R339 Retention of urine, unspecified: Secondary | ICD-10-CM | POA: Diagnosis not present

## 2016-02-26 DIAGNOSIS — R1312 Dysphagia, oropharyngeal phase: Secondary | ICD-10-CM | POA: Diagnosis not present

## 2016-02-26 DIAGNOSIS — I2699 Other pulmonary embolism without acute cor pulmonale: Secondary | ICD-10-CM | POA: Diagnosis not present

## 2016-02-26 DIAGNOSIS — M545 Low back pain: Secondary | ICD-10-CM | POA: Diagnosis not present

## 2016-02-26 DIAGNOSIS — G8929 Other chronic pain: Secondary | ICD-10-CM | POA: Diagnosis not present

## 2016-02-26 DIAGNOSIS — Z7901 Long term (current) use of anticoagulants: Secondary | ICD-10-CM | POA: Diagnosis not present

## 2016-02-26 DIAGNOSIS — R69 Illness, unspecified: Secondary | ICD-10-CM | POA: Diagnosis not present

## 2016-02-26 DIAGNOSIS — I480 Paroxysmal atrial fibrillation: Secondary | ICD-10-CM | POA: Diagnosis not present

## 2016-02-26 DIAGNOSIS — S72002D Fracture of unspecified part of neck of left femur, subsequent encounter for closed fracture with routine healing: Secondary | ICD-10-CM | POA: Diagnosis not present

## 2016-03-02 DIAGNOSIS — R339 Retention of urine, unspecified: Secondary | ICD-10-CM | POA: Diagnosis not present

## 2016-03-02 DIAGNOSIS — R69 Illness, unspecified: Secondary | ICD-10-CM | POA: Diagnosis not present

## 2016-03-02 DIAGNOSIS — I82413 Acute embolism and thrombosis of femoral vein, bilateral: Secondary | ICD-10-CM | POA: Diagnosis not present

## 2016-03-02 DIAGNOSIS — Z7901 Long term (current) use of anticoagulants: Secondary | ICD-10-CM | POA: Diagnosis not present

## 2016-03-02 DIAGNOSIS — I2699 Other pulmonary embolism without acute cor pulmonale: Secondary | ICD-10-CM | POA: Diagnosis not present

## 2016-03-02 DIAGNOSIS — R1312 Dysphagia, oropharyngeal phase: Secondary | ICD-10-CM | POA: Diagnosis not present

## 2016-03-02 DIAGNOSIS — M545 Low back pain: Secondary | ICD-10-CM | POA: Diagnosis not present

## 2016-03-02 DIAGNOSIS — I480 Paroxysmal atrial fibrillation: Secondary | ICD-10-CM | POA: Diagnosis not present

## 2016-03-02 DIAGNOSIS — G8929 Other chronic pain: Secondary | ICD-10-CM | POA: Diagnosis not present

## 2016-03-02 DIAGNOSIS — S72002D Fracture of unspecified part of neck of left femur, subsequent encounter for closed fracture with routine healing: Secondary | ICD-10-CM | POA: Diagnosis not present

## 2016-03-03 DIAGNOSIS — S72002D Fracture of unspecified part of neck of left femur, subsequent encounter for closed fracture with routine healing: Secondary | ICD-10-CM | POA: Diagnosis not present

## 2016-03-04 DIAGNOSIS — R1312 Dysphagia, oropharyngeal phase: Secondary | ICD-10-CM | POA: Diagnosis not present

## 2016-03-04 DIAGNOSIS — I82413 Acute embolism and thrombosis of femoral vein, bilateral: Secondary | ICD-10-CM | POA: Diagnosis not present

## 2016-03-04 DIAGNOSIS — G8929 Other chronic pain: Secondary | ICD-10-CM | POA: Diagnosis not present

## 2016-03-04 DIAGNOSIS — R69 Illness, unspecified: Secondary | ICD-10-CM | POA: Diagnosis not present

## 2016-03-04 DIAGNOSIS — Z7901 Long term (current) use of anticoagulants: Secondary | ICD-10-CM | POA: Diagnosis not present

## 2016-03-04 DIAGNOSIS — M545 Low back pain: Secondary | ICD-10-CM | POA: Diagnosis not present

## 2016-03-04 DIAGNOSIS — I480 Paroxysmal atrial fibrillation: Secondary | ICD-10-CM | POA: Diagnosis not present

## 2016-03-04 DIAGNOSIS — S72002D Fracture of unspecified part of neck of left femur, subsequent encounter for closed fracture with routine healing: Secondary | ICD-10-CM | POA: Diagnosis not present

## 2016-03-04 DIAGNOSIS — R339 Retention of urine, unspecified: Secondary | ICD-10-CM | POA: Diagnosis not present

## 2016-03-04 DIAGNOSIS — I2699 Other pulmonary embolism without acute cor pulmonale: Secondary | ICD-10-CM | POA: Diagnosis not present

## 2016-03-08 DIAGNOSIS — I82413 Acute embolism and thrombosis of femoral vein, bilateral: Secondary | ICD-10-CM | POA: Diagnosis not present

## 2016-03-08 DIAGNOSIS — S72002D Fracture of unspecified part of neck of left femur, subsequent encounter for closed fracture with routine healing: Secondary | ICD-10-CM | POA: Diagnosis not present

## 2016-03-08 DIAGNOSIS — I2699 Other pulmonary embolism without acute cor pulmonale: Secondary | ICD-10-CM | POA: Diagnosis not present

## 2016-03-08 DIAGNOSIS — R1312 Dysphagia, oropharyngeal phase: Secondary | ICD-10-CM | POA: Diagnosis not present

## 2016-03-10 ENCOUNTER — Other Ambulatory Visit: Payer: Self-pay | Admitting: Interventional Radiology

## 2016-03-10 DIAGNOSIS — S72002D Fracture of unspecified part of neck of left femur, subsequent encounter for closed fracture with routine healing: Secondary | ICD-10-CM | POA: Diagnosis not present

## 2016-03-10 DIAGNOSIS — G8929 Other chronic pain: Secondary | ICD-10-CM | POA: Diagnosis not present

## 2016-03-10 DIAGNOSIS — Z95828 Presence of other vascular implants and grafts: Secondary | ICD-10-CM

## 2016-03-10 DIAGNOSIS — I480 Paroxysmal atrial fibrillation: Secondary | ICD-10-CM | POA: Diagnosis not present

## 2016-03-10 DIAGNOSIS — R1312 Dysphagia, oropharyngeal phase: Secondary | ICD-10-CM | POA: Diagnosis not present

## 2016-03-10 DIAGNOSIS — I82413 Acute embolism and thrombosis of femoral vein, bilateral: Secondary | ICD-10-CM | POA: Diagnosis not present

## 2016-03-10 DIAGNOSIS — R339 Retention of urine, unspecified: Secondary | ICD-10-CM | POA: Diagnosis not present

## 2016-03-10 DIAGNOSIS — R69 Illness, unspecified: Secondary | ICD-10-CM | POA: Diagnosis not present

## 2016-03-10 DIAGNOSIS — M545 Low back pain: Secondary | ICD-10-CM | POA: Diagnosis not present

## 2016-03-10 DIAGNOSIS — I2699 Other pulmonary embolism without acute cor pulmonale: Secondary | ICD-10-CM | POA: Diagnosis not present

## 2016-03-10 DIAGNOSIS — Z7901 Long term (current) use of anticoagulants: Secondary | ICD-10-CM | POA: Diagnosis not present

## 2016-03-12 DIAGNOSIS — I2699 Other pulmonary embolism without acute cor pulmonale: Secondary | ICD-10-CM | POA: Diagnosis not present

## 2016-03-12 DIAGNOSIS — R339 Retention of urine, unspecified: Secondary | ICD-10-CM | POA: Diagnosis not present

## 2016-03-12 DIAGNOSIS — I480 Paroxysmal atrial fibrillation: Secondary | ICD-10-CM | POA: Diagnosis not present

## 2016-03-12 DIAGNOSIS — M545 Low back pain: Secondary | ICD-10-CM | POA: Diagnosis not present

## 2016-03-12 DIAGNOSIS — S72002D Fracture of unspecified part of neck of left femur, subsequent encounter for closed fracture with routine healing: Secondary | ICD-10-CM | POA: Diagnosis not present

## 2016-03-12 DIAGNOSIS — R69 Illness, unspecified: Secondary | ICD-10-CM | POA: Diagnosis not present

## 2016-03-12 DIAGNOSIS — R1312 Dysphagia, oropharyngeal phase: Secondary | ICD-10-CM | POA: Diagnosis not present

## 2016-03-12 DIAGNOSIS — I82413 Acute embolism and thrombosis of femoral vein, bilateral: Secondary | ICD-10-CM | POA: Diagnosis not present

## 2016-03-12 DIAGNOSIS — G8929 Other chronic pain: Secondary | ICD-10-CM | POA: Diagnosis not present

## 2016-03-12 DIAGNOSIS — Z7901 Long term (current) use of anticoagulants: Secondary | ICD-10-CM | POA: Diagnosis not present

## 2016-03-15 DIAGNOSIS — R69 Illness, unspecified: Secondary | ICD-10-CM | POA: Diagnosis not present

## 2016-03-15 DIAGNOSIS — I82413 Acute embolism and thrombosis of femoral vein, bilateral: Secondary | ICD-10-CM | POA: Diagnosis not present

## 2016-03-15 DIAGNOSIS — Z7901 Long term (current) use of anticoagulants: Secondary | ICD-10-CM | POA: Diagnosis not present

## 2016-03-15 DIAGNOSIS — G8929 Other chronic pain: Secondary | ICD-10-CM | POA: Diagnosis not present

## 2016-03-15 DIAGNOSIS — S72002D Fracture of unspecified part of neck of left femur, subsequent encounter for closed fracture with routine healing: Secondary | ICD-10-CM | POA: Diagnosis not present

## 2016-03-15 DIAGNOSIS — I2699 Other pulmonary embolism without acute cor pulmonale: Secondary | ICD-10-CM | POA: Diagnosis not present

## 2016-03-15 DIAGNOSIS — I480 Paroxysmal atrial fibrillation: Secondary | ICD-10-CM | POA: Diagnosis not present

## 2016-03-15 DIAGNOSIS — M545 Low back pain: Secondary | ICD-10-CM | POA: Diagnosis not present

## 2016-03-15 DIAGNOSIS — R1312 Dysphagia, oropharyngeal phase: Secondary | ICD-10-CM | POA: Diagnosis not present

## 2016-03-15 DIAGNOSIS — R339 Retention of urine, unspecified: Secondary | ICD-10-CM | POA: Diagnosis not present

## 2016-03-17 ENCOUNTER — Encounter: Payer: Self-pay | Admitting: Physical Medicine & Rehabilitation

## 2016-03-17 ENCOUNTER — Encounter: Payer: Medicare HMO | Attending: Physical Medicine & Rehabilitation | Admitting: Physical Medicine & Rehabilitation

## 2016-03-17 DIAGNOSIS — R350 Frequency of micturition: Secondary | ICD-10-CM | POA: Insufficient documentation

## 2016-03-17 DIAGNOSIS — M7918 Myalgia, other site: Secondary | ICD-10-CM | POA: Insufficient documentation

## 2016-03-17 DIAGNOSIS — E876 Hypokalemia: Secondary | ICD-10-CM | POA: Insufficient documentation

## 2016-03-17 DIAGNOSIS — Z86718 Personal history of other venous thrombosis and embolism: Secondary | ICD-10-CM | POA: Insufficient documentation

## 2016-03-17 DIAGNOSIS — R269 Unspecified abnormalities of gait and mobility: Secondary | ICD-10-CM | POA: Insufficient documentation

## 2016-03-17 DIAGNOSIS — G8929 Other chronic pain: Secondary | ICD-10-CM | POA: Insufficient documentation

## 2016-03-17 DIAGNOSIS — M25552 Pain in left hip: Secondary | ICD-10-CM | POA: Insufficient documentation

## 2016-03-17 DIAGNOSIS — M791 Myalgia: Secondary | ICD-10-CM | POA: Diagnosis not present

## 2016-03-17 DIAGNOSIS — R531 Weakness: Secondary | ICD-10-CM | POA: Diagnosis not present

## 2016-03-17 MED ORDER — MELOXICAM 7.5 MG PO TABS: 7.5000 mg | ORAL_TABLET | Freq: Every day | ORAL | 0 refills | Status: DC

## 2016-03-17 NOTE — Progress Notes (Signed)
Subjective:    Patient ID: Bonnie Salazar, female    DOB: [birth year], 75 y.o.   MRN: [id number]  HPI   Bonnie Salazar is here regarding her left hip fracture and multiple pain complaints. She has continued to have pain in her left hip. She apparently had a left troch injection by Dr. Percell Miller which may have helped some of her lateral pain. She continues to have severe pain in the posterior left hip. Stretching and massage seemed to make it worse. She struggles with sleeping on her back.   She is unable to tolerate tramadol due to sedation. She remains on xarelto for her LE DVT.    Pain Inventory Average Pain 6 Pain Right Now 6 My pain is aching  In the last 24 hours, has pain interfered with the following? General activity 10 Relation with others 2 Enjoyment of life 10 What TIME of day is your pain at its worst? evening Sleep (in general) Poor  Pain is worse with: sitting Pain improves with: heat/ice Relief from Meds: 2  Mobility use a walker  Function retired I need assistance with the following:  meal prep, household duties and shopping  Neuro/Psych bladder control problems  Prior Studies Any changes since last visit?  no  Physicians involved in your care Any changes since last visit?  no   Family History  Problem Relation Age of Onset  . Hodgkin's lymphoma Mother   . Stroke Father   . Coronary artery disease Father     in his late 37s   Social History   Social History  . Marital status: Married    Spouse name: N/A  . Number of children: N/A  . Years of education: N/A   Occupational History  . Retired Estate manager/land agent    Social History Main Topics  . Smoking status: Former Research scientist (life sciences)  . Smokeless tobacco: Former Systems developer    Quit date: 12/11/1995  . Alcohol use No  . Drug use: No  . Sexual activity: Not Asked   Other Topics Concern  . None   Social History Narrative   Pt lives with her husband, walks daily.   Past Surgical History:    Procedure Laterality Date  . BACK SURGERY    . IVC FILTER PLACEMENT ([facility] HX)    . TOTAL HIP ARTHROPLASTY Left 12/26/2015   Procedure: TOTAL HIP ARTHROPLASTY ANTERIOR APPROACH;  Surgeon: Renette Butters, MD;  Location: [facility];  Service: Orthopedics;  Laterality: Left;   Past Medical History:  Diagnosis Date  . Acute pulmonary embolism ([place]) 12/28/2015  . Acute respiratory failure with hypoxia ([place]) 01/2016  . Arterial hemorrhage 01/2016  . Atrial mass 01/2016  . Cardiac arrest ([place]) 12/2015  . Female pelvic hematoma 01/2016  . Fracture of femoral neck, left ([place]) 01/2016  . Hypernatremia 01/2016  . Right atrial thrombus ([place]) 01/2016   BP 108/73 (BP Location: Left Arm, Patient Position: Standing, Cuff Size: Normal)   Pulse (!) 103   SpO2 95%   Opioid Risk Score:   Fall Risk Score:  `1  Depression screen PHQ 2/9  Depression screen [facility] 2/9 03/17/2016 02/11/2016  Decreased Interest 1 0  Down, Depressed, Hopeless 1 0  PHQ - 2 Score 2 0  Altered sleeping - 3  Tired, decreased energy - 0  Change in appetite - 0  Feeling bad or failure about yourself  - 0  Trouble concentrating - 0  Moving slowly or fidgety/restless - 0  Suicidal thoughts -  0  PHQ-9 Score - 3     Review of Systems  All other systems reviewed and are negative.      Objective:   Physical Exam  Constitutional: She appears well-developed and well-nourished. NAD.  HENT: Normocephalic and atraumatic.  Eyes: Conjunctivae and EOM are normal.  Cardiovascular: Normal rate and regular rhythm.no MRG Respiratory: Effort normal and breath sounds normal. No stridor. No respiratory distress. She has no wheezes.  GI: + BS.  Musculoskeletal: She exhibits edemaand tenderness at the left femoral condyle and fibula. Left lateral thigh painful proximally to greater troch. More painful with cross leg maneuver and left hip flexion Edema trace at most LLE Neurological: She is alert and oriented.  Ambuilates with mild antalgia  LLE Motor: B/l UE: 4+/5 proximal to distal RLE: Hip flexion 4/5, knee extension 4/5, ankle dorsi/plantar flexion 5/5 LLE: Hip flexion 3-/5, knee extension 3+/5, ankle dorsi/plantar flexion 5/5 Skin: Skin is warm and dry.  Left anterior hip incision healed Psychiatric: affect more dynamic. Cognition and memory are normal.       Assessment & Plan:  Medical Problem List and Plan: 1.Abnormality of gait, weakness, low endurance secondary to debility after left hip fracture complicated by arterial bleed, hemorrhagic shock, bilateral PE, PEA,Atrial thrombus, GIB. medical issues. -continue HH therapies  2.Bilateral PE/DVT: IVC filter in place.  Hx of GIB. -can stop xarelto today. If she develops phelibitic signs in her legs, we will need to look at resuming   3. 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  Tramadol prn for severe pain----at least at night -regular ice                        -continue PT for rom/modalities  - -will initiate low dose meloxicam for left hip/piriformis pain FOR ONLY  A 2 WEEK COURSE.   After informed consent and preparation of the skin with isopropyl alcohol, I injected the left piriformis/superior gem with 2cc of 1% lidocaine. The patient tolerated well, and no complications were experienced. Post-injection instructions were provided.   5. RA thrombus: Resolved post thrombolysis Repeat Echo ordered per Cardiology, reviewed, no thrombus, slightly improved 9. PAF: In NSR --  off metoprolol   10 GIB:H/H stable. Continue PPI.  -no clinical signs of bleeding.   -careful observation while on  meloxicam  11. ABLA: Has been treated with multiple units PRBC post arterial hemorrhage/GIB.   14. Constipation: Resolved     16. Hypokalemia --daily kdur for 2 weeks then stop 17. Freq urination- due to hematoma -urology referral made   Thirty minutes+ of face to face patient care time were spent during this visit. All questions were encouraged and answered. Follow up in one month.

## 2016-03-17 NOTE — Patient Instructions (Signed)
PLEASE CALL ME WITH ANY PROBLEMS OR QUESTIONS ([phone])  

## 2016-03-18 DIAGNOSIS — I82413 Acute embolism and thrombosis of femoral vein, bilateral: Secondary | ICD-10-CM | POA: Diagnosis not present

## 2016-03-18 DIAGNOSIS — R1312 Dysphagia, oropharyngeal phase: Secondary | ICD-10-CM | POA: Diagnosis not present

## 2016-03-18 DIAGNOSIS — I2699 Other pulmonary embolism without acute cor pulmonale: Secondary | ICD-10-CM | POA: Diagnosis not present

## 2016-03-18 DIAGNOSIS — R339 Retention of urine, unspecified: Secondary | ICD-10-CM | POA: Diagnosis not present

## 2016-03-18 DIAGNOSIS — S72002D Fracture of unspecified part of neck of left femur, subsequent encounter for closed fracture with routine healing: Secondary | ICD-10-CM | POA: Diagnosis not present

## 2016-03-18 DIAGNOSIS — R69 Illness, unspecified: Secondary | ICD-10-CM | POA: Diagnosis not present

## 2016-03-18 DIAGNOSIS — M545 Low back pain: Secondary | ICD-10-CM | POA: Diagnosis not present

## 2016-03-18 DIAGNOSIS — G8929 Other chronic pain: Secondary | ICD-10-CM | POA: Diagnosis not present

## 2016-03-18 DIAGNOSIS — I480 Paroxysmal atrial fibrillation: Secondary | ICD-10-CM | POA: Diagnosis not present

## 2016-03-18 DIAGNOSIS — Z7901 Long term (current) use of anticoagulants: Secondary | ICD-10-CM | POA: Diagnosis not present

## 2016-03-20 DIAGNOSIS — Z13820 Encounter for screening for osteoporosis: Secondary | ICD-10-CM | POA: Diagnosis not present

## 2016-03-24 DIAGNOSIS — R69 Illness, unspecified: Secondary | ICD-10-CM | POA: Diagnosis not present

## 2016-03-24 DIAGNOSIS — Z7901 Long term (current) use of anticoagulants: Secondary | ICD-10-CM | POA: Diagnosis not present

## 2016-03-24 DIAGNOSIS — I2699 Other pulmonary embolism without acute cor pulmonale: Secondary | ICD-10-CM | POA: Diagnosis not present

## 2016-03-24 DIAGNOSIS — G8929 Other chronic pain: Secondary | ICD-10-CM | POA: Diagnosis not present

## 2016-03-24 DIAGNOSIS — I82413 Acute embolism and thrombosis of femoral vein, bilateral: Secondary | ICD-10-CM | POA: Diagnosis not present

## 2016-03-24 DIAGNOSIS — I480 Paroxysmal atrial fibrillation: Secondary | ICD-10-CM | POA: Diagnosis not present

## 2016-03-24 DIAGNOSIS — M545 Low back pain: Secondary | ICD-10-CM | POA: Diagnosis not present

## 2016-03-24 DIAGNOSIS — S72002D Fracture of unspecified part of neck of left femur, subsequent encounter for closed fracture with routine healing: Secondary | ICD-10-CM | POA: Diagnosis not present

## 2016-03-24 DIAGNOSIS — R1312 Dysphagia, oropharyngeal phase: Secondary | ICD-10-CM | POA: Diagnosis not present

## 2016-03-24 DIAGNOSIS — R339 Retention of urine, unspecified: Secondary | ICD-10-CM | POA: Diagnosis not present

## 2016-03-26 DIAGNOSIS — I2699 Other pulmonary embolism without acute cor pulmonale: Secondary | ICD-10-CM | POA: Diagnosis not present

## 2016-03-26 DIAGNOSIS — G8929 Other chronic pain: Secondary | ICD-10-CM | POA: Diagnosis not present

## 2016-03-26 DIAGNOSIS — R69 Illness, unspecified: Secondary | ICD-10-CM | POA: Diagnosis not present

## 2016-03-26 DIAGNOSIS — S72002D Fracture of unspecified part of neck of left femur, subsequent encounter for closed fracture with routine healing: Secondary | ICD-10-CM | POA: Diagnosis not present

## 2016-03-26 DIAGNOSIS — I82413 Acute embolism and thrombosis of femoral vein, bilateral: Secondary | ICD-10-CM | POA: Diagnosis not present

## 2016-03-26 DIAGNOSIS — M545 Low back pain: Secondary | ICD-10-CM | POA: Diagnosis not present

## 2016-03-26 DIAGNOSIS — Z7901 Long term (current) use of anticoagulants: Secondary | ICD-10-CM | POA: Diagnosis not present

## 2016-03-26 DIAGNOSIS — R339 Retention of urine, unspecified: Secondary | ICD-10-CM | POA: Diagnosis not present

## 2016-03-26 DIAGNOSIS — R1312 Dysphagia, oropharyngeal phase: Secondary | ICD-10-CM | POA: Diagnosis not present

## 2016-03-26 DIAGNOSIS — I480 Paroxysmal atrial fibrillation: Secondary | ICD-10-CM | POA: Diagnosis not present

## 2016-03-30 DIAGNOSIS — I2699 Other pulmonary embolism without acute cor pulmonale: Secondary | ICD-10-CM | POA: Diagnosis not present

## 2016-03-30 DIAGNOSIS — I82413 Acute embolism and thrombosis of femoral vein, bilateral: Secondary | ICD-10-CM | POA: Diagnosis not present

## 2016-03-30 DIAGNOSIS — R69 Illness, unspecified: Secondary | ICD-10-CM | POA: Diagnosis not present

## 2016-03-30 DIAGNOSIS — R339 Retention of urine, unspecified: Secondary | ICD-10-CM | POA: Diagnosis not present

## 2016-03-30 DIAGNOSIS — I480 Paroxysmal atrial fibrillation: Secondary | ICD-10-CM | POA: Diagnosis not present

## 2016-03-30 DIAGNOSIS — R1312 Dysphagia, oropharyngeal phase: Secondary | ICD-10-CM | POA: Diagnosis not present

## 2016-03-30 DIAGNOSIS — S72002D Fracture of unspecified part of neck of left femur, subsequent encounter for closed fracture with routine healing: Secondary | ICD-10-CM | POA: Diagnosis not present

## 2016-03-30 DIAGNOSIS — G8929 Other chronic pain: Secondary | ICD-10-CM | POA: Diagnosis not present

## 2016-03-30 DIAGNOSIS — Z7901 Long term (current) use of anticoagulants: Secondary | ICD-10-CM | POA: Diagnosis not present

## 2016-03-30 DIAGNOSIS — M545 Low back pain: Secondary | ICD-10-CM | POA: Diagnosis not present

## 2016-04-02 ENCOUNTER — Telehealth: Payer: Self-pay | Admitting: Physical Medicine & Rehabilitation

## 2016-04-02 DIAGNOSIS — Z7901 Long term (current) use of anticoagulants: Secondary | ICD-10-CM | POA: Diagnosis not present

## 2016-04-02 DIAGNOSIS — R339 Retention of urine, unspecified: Secondary | ICD-10-CM | POA: Diagnosis not present

## 2016-04-02 DIAGNOSIS — S72002D Fracture of unspecified part of neck of left femur, subsequent encounter for closed fracture with routine healing: Secondary | ICD-10-CM | POA: Diagnosis not present

## 2016-04-02 DIAGNOSIS — I82413 Acute embolism and thrombosis of femoral vein, bilateral: Secondary | ICD-10-CM | POA: Diagnosis not present

## 2016-04-02 DIAGNOSIS — R69 Illness, unspecified: Secondary | ICD-10-CM | POA: Diagnosis not present

## 2016-04-02 DIAGNOSIS — M545 Low back pain: Secondary | ICD-10-CM | POA: Diagnosis not present

## 2016-04-02 DIAGNOSIS — I480 Paroxysmal atrial fibrillation: Secondary | ICD-10-CM | POA: Diagnosis not present

## 2016-04-02 DIAGNOSIS — G8929 Other chronic pain: Secondary | ICD-10-CM | POA: Diagnosis not present

## 2016-04-02 DIAGNOSIS — R1312 Dysphagia, oropharyngeal phase: Secondary | ICD-10-CM | POA: Diagnosis not present

## 2016-04-02 DIAGNOSIS — I2699 Other pulmonary embolism without acute cor pulmonale: Secondary | ICD-10-CM | POA: Diagnosis not present

## 2016-04-02 NOTE — Telephone Encounter (Signed)
Patrecia Pace PT with [facility] would like to re certify the patient 2w4.  Please call him with verbal orders at [phone].

## 2016-04-02 NOTE — Telephone Encounter (Signed)
Verbal orders given  

## 2016-04-05 DIAGNOSIS — S72002D Fracture of unspecified part of neck of left femur, subsequent encounter for closed fracture with routine healing: Secondary | ICD-10-CM | POA: Diagnosis not present

## 2016-04-05 DIAGNOSIS — I82413 Acute embolism and thrombosis of femoral vein, bilateral: Secondary | ICD-10-CM | POA: Diagnosis not present

## 2016-04-05 DIAGNOSIS — R339 Retention of urine, unspecified: Secondary | ICD-10-CM | POA: Diagnosis not present

## 2016-04-05 DIAGNOSIS — Z7901 Long term (current) use of anticoagulants: Secondary | ICD-10-CM | POA: Diagnosis not present

## 2016-04-05 DIAGNOSIS — R69 Illness, unspecified: Secondary | ICD-10-CM | POA: Diagnosis not present

## 2016-04-05 DIAGNOSIS — M545 Low back pain: Secondary | ICD-10-CM | POA: Diagnosis not present

## 2016-04-05 DIAGNOSIS — I2699 Other pulmonary embolism without acute cor pulmonale: Secondary | ICD-10-CM | POA: Diagnosis not present

## 2016-04-05 DIAGNOSIS — G8929 Other chronic pain: Secondary | ICD-10-CM | POA: Diagnosis not present

## 2016-04-05 DIAGNOSIS — I480 Paroxysmal atrial fibrillation: Secondary | ICD-10-CM | POA: Diagnosis not present

## 2016-04-05 DIAGNOSIS — R1312 Dysphagia, oropharyngeal phase: Secondary | ICD-10-CM | POA: Diagnosis not present

## 2016-04-06 ENCOUNTER — Ambulatory Visit
Admission: RE | Admit: 2016-04-06 | Discharge: 2016-04-06 | Disposition: A | Discharge status: Home / Self Care | Payer: Medicare HMO | Source: Ambulatory Visit | Attending: Interventional Radiology | Admitting: Interventional Radiology

## 2016-04-06 DIAGNOSIS — R1312 Dysphagia, oropharyngeal phase: Secondary | ICD-10-CM | POA: Diagnosis not present

## 2016-04-06 DIAGNOSIS — Z95828 Presence of other vascular implants and grafts: Secondary | ICD-10-CM

## 2016-04-06 DIAGNOSIS — M545 Low back pain: Secondary | ICD-10-CM | POA: Diagnosis not present

## 2016-04-06 DIAGNOSIS — I82413 Acute embolism and thrombosis of femoral vein, bilateral: Secondary | ICD-10-CM | POA: Diagnosis not present

## 2016-04-06 DIAGNOSIS — S72002D Fracture of unspecified part of neck of left femur, subsequent encounter for closed fracture with routine healing: Secondary | ICD-10-CM | POA: Diagnosis not present

## 2016-04-06 DIAGNOSIS — I480 Paroxysmal atrial fibrillation: Secondary | ICD-10-CM | POA: Diagnosis not present

## 2016-04-06 DIAGNOSIS — Z7901 Long term (current) use of anticoagulants: Secondary | ICD-10-CM | POA: Diagnosis not present

## 2016-04-06 DIAGNOSIS — I2699 Other pulmonary embolism without acute cor pulmonale: Secondary | ICD-10-CM | POA: Diagnosis not present

## 2016-04-06 DIAGNOSIS — R69 Illness, unspecified: Secondary | ICD-10-CM | POA: Diagnosis not present

## 2016-04-06 DIAGNOSIS — S35512A Injury of left iliac artery, initial encounter: Secondary | ICD-10-CM | POA: Diagnosis not present

## 2016-04-06 DIAGNOSIS — G8929 Other chronic pain: Secondary | ICD-10-CM | POA: Diagnosis not present

## 2016-04-06 DIAGNOSIS — R339 Retention of urine, unspecified: Secondary | ICD-10-CM | POA: Diagnosis not present

## 2016-04-06 HISTORY — PX: IR GENERIC HISTORICAL: IMG1180011

## 2016-04-06 NOTE — Progress Notes (Signed)
Chief Complaint: IVC filter  Referring Physician(s): Dr. Judeth Horn, General Surgery  Dr. Edmonia Lynch, Orthopedic Surgery  Dr. Alger Simons, PM & R  Dr. Tollie Eth, Cardiology  Dr. Sullivan Lone, Hematology  Ms Bing Matter, PA, Family Medicine   History of Present Illness: Bonnie Salazar is a 75 y.o. female presenting as a scheduled follow-up to vascular and interventional radiology clinic. She is here today with her husband.  June hospital course: I first met Mr. Gelinas at the beginning of a hospital admission, 12/26/2015, after an episode of life-threatening hemorrhage related to injury to left external iliac artery. On this date and angiogram was performed and we sealed the bleeding artery with a covered stent. Over that weekend, she developed giant atrial thrombus, pulmonary embolism, and associated cardiovascular compromise. Catheter directed lysis of the giant atrial thrombus was performed in lieu of cardiac surgery to decrease morbidity. After the lysis, which was performed and completed on Sunday, June 18, cardiac echo demonstrated resolution of the joint atrial thrombus. Also, CT demonstrated decreased pulmonary embolism burden.  A retrievable IVC filter was then placed 12/28/2015, to protect from any further thromboembolism.  She had a protracted hospital course, and was discharged to rehabilitation July 7, and subsequently discharged from rehabilitation July 26.  Today she tells me that she has recovered for the most part after her hospitalization, though does have ongoing left hip pain. She is still undergoing therapy for her hip pain. The pain is present at rest, and is not elicited or made any worse by short distance ambulation. She does not have typical rest pain symptoms. She denies any hip or buttock claudication. Noninvasive arterial study on today's date demonstrates normal lower extremity vascular exam and she has strong palpable  pulses.  Regarding her retrievable IVC filter, we had a short discussion about retrieving the filter at 6 month time frame after placement. She is not yet ready to discuss a procedure for IVC filter removal.  She had another question about urinary symptoms of frequency, with a prior CT demonstrating significant residual hematoma in her pelvis. Her question was whether urology consultation would be appropriate. She denies any hematuria or incontinence, with her only complaint frequency and urgency.    Past Medical History:  Diagnosis Date  . Acute pulmonary embolism ([place]) 12/28/2015  . Acute respiratory failure with hypoxia ([place]) 01/2016  . Arterial hemorrhage 01/2016  . Atrial mass 01/2016  . Cardiac arrest ([place]) 12/2015  . Female pelvic hematoma 01/2016  . Fracture of femoral neck, left ([place]) 01/2016  . Hypernatremia 01/2016  . 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 BACK SURGERY    . IVC FILTER PLACEMENT ([facility] HX)    . TOTAL HIP ARTHROPLASTY Left 12/26/2015   Procedure: TOTAL HIP ARTHROPLASTY ANTERIOR APPROACH;  Surgeon: Renette Butters, MD;  Location: [facility];  Service: Orthopedics;  Laterality: Left;    Allergies: Hydrocodone  Medications: Prior to Admission medications   Medication Sig Start Date End Date Taking? Authorizing Provider  acetaminophen (TYLENOL) 325 MG tablet Take 2 tablets (650 mg total) by mouth every 6 (six) hours as needed for mild pain. 01/16/16  Yes Clanford Marisa Hua, MD  Cholecalciferol (VITAMIN D PO) Take 1 capsule by mouth daily.   Yes Historical Provider, MD  diclofenac sodium (VOLTAREN) 1 % GEL Apply 2 g topically 4 (four) times daily. 02/04/16  Yes Bary Leriche, PA-C  Multiple Vitamins-Minerals (PRESERVISION  AREDS 2 PO) Take 2 tablets by mouth daily.   Yes Historical Provider, MD  fluticasone (FLONASE) 50 MCG/ACT nasal spray Place 2 sprays into the nose daily as needed for allergies.  11/01/15   Historical  Provider, MD  meloxicam (MOBIC) 7.5 MG tablet Take 1 tablet (7.5 mg total) by mouth daily. Patient not taking: Reported on 04/06/2016 03/17/16   Meredith Staggers, MD  Multiple Vitamin (MULTIVITAMIN WITH MINERALS) TABS tablet Take 1 tablet by mouth daily.    Historical Provider, MD  pantoprazole (PROTONIX) 40 MG tablet Take 1 tablet (40 mg total) by mouth daily. Patient not taking: Reported on 04/06/2016 02/04/16   Bary Leriche, PA-C     Family History  Problem Relation Age of Onset  . Hodgkin's lymphoma Mother   . Stroke Father   . Coronary artery disease Father     in his late 43s    Social History   Social History  . Marital status: Married    Spouse name: N/A  . Number of children: N/A  . Years of education: N/A   Occupational History  . Retired Estate manager/land agent    Social History Main Topics  . Smoking status: Former Research scientist (life sciences)  . Smokeless tobacco: Former Systems developer    Quit date: 12/11/1995  . Alcohol use No  . Drug use: No  . Sexual activity: Not on file   Other Topics Concern  . Not on file   Social History Narrative   Pt lives with her husband, walks daily.    Review of Systems: A 12 point ROS discussed and pertinent positives are indicated in the HPI above.  All other systems are negative.  Review of Systems  Vital Signs: BP 112/66 (BP Location: Left Arm, Patient Position: Sitting, Cuff Size: Normal)   Pulse 78   Temp 97.9 F (36.6 C) (Oral)   Resp 15   Ht '5\' 7"'$  (1.702 m)   Wt 130 lb (59 kg)   SpO2 98%   BMI 20.36 kg/m   Physical Exam  Targeted exam of the lower semi-demonstrates strongly palpable posterior tibial and dorsalis pedis pulses bilateral. No swelling. Negative Homans sign. Nontender abdomen.  Imaging: US Arterial Seg Multiple  Result Date: 04/06/2016 CLINICAL DATA:  75 year old female with a history of intraoperative injury of the distal left external iliac artery just above the inguinal ligament, with life-threatening hemorrhage.  Endovascular repair was performed with a 6 mm diameter by 5 cm length Vi[place]ahn. She returns for evaluation EXAM: NONINVASIVE PHYSIOLOGIC VASCULAR STUDY OF BILATERAL LOWER EXTREMITIES TECHNIQUE: Evaluation of both lower extremities was performed at rest, including calculation of ankle-brachial indices, multiple segmental pressure evaluation, segmental Doppler and segmental pulse volume recording. COMPARISON:  None. FINDINGS: Right: Resting ankle brachial index:  1.13 Segmental blood pressure: Symmetric upper extremity pressures. Appropriate increased to the thigh. No significant drop from segment a segment. Digital pressure measures 98 systolic Doppler: Segmental Doppler of the right lower extremity demonstrates triphasic waveform throughout. Pulse volume recording: Segmental PVR of the right lower extremity demonstrates maintained waveform and amplitude with augmentation maintained. Left: Resting ankle brachial index: 1.10 Segmental blood pressure: Symmetric upper extremity pressures. Appropriate increased to the thigh. No significant drop from segment a segment. Digital pressure measures 83 systolic. Doppler: Segmental Doppler of the left lower extremity demonstrates triphasic waveforms throughout. Pulse volume recording: Segmental PVR of the left lower extremity demonstrates amplitude and quality of the waveform maintained. Augmentation maintained. Additional: IMPRESSION: Resting noninvasive examination demonstrates no evidence of significant  arterial occlusive disease. Signed, Dulcy Fanny. Earleen Newport, DO [facility] Radiology Electronically Signed   By: Corrie Mckusick D.O.   On: 04/06/2016 17:26    Labs:  CBC:  Recent Labs  01/27/16 0514 01/28/16 0632 01/30/16 0542 02/02/16 0514 02/04/16 0550  WBC 7.1  --  9.0 6.0 6.4  HGB 10.3* 10.7* 10.2* 10.5* 10.5*  HCT 33.4* 34.0* 32.7* 33.9* 34.2*  PLT 335  --  403* 473* 462*    COAGS:  Recent Labs   12/26/15 1740 12/27/15 0000 12/27/15 0400 12/31/15 0951 01/06/16 1430  INR 2.01* 1.35 1.37 1.30 1.29  APTT 29  --   --  33  --     BMP:  Recent Labs  01/23/16 0337 01/27/16 0514 02/02/16 0514 02/04/16 0550  NA 134* 137 138 138  K 3.7 3.7 4.2 4.1  CL 99* 104 104 103  CO2 '28 27 28 26  '$ GLUCOSE 109* 105* 102* 104*  BUN 5* 5* 6 <5*  CALCIUM 9.0 9.2 9.3 9.6  CREATININE 0.52 0.56 0.42* 0.45  GFRNONAA >60 >60 >60 >60  GFRAA >60 >60 >60 >60    LIVER FUNCTION TESTS:  Recent Labs  01/14/16 0405 01/17/16 0421 01/21/16 0817 02/04/16 0550  BILITOT 0.9 0.9 0.5 0.3  AST 55* 85* 109* 41  ALT 69* 102* 159* 60*  ALKPHOS 80 156* 261* 321*  PROT 4.9* 5.6* 5.3* 5.6*  ALBUMIN 2.3* 2.3* 2.0* 2.4*    TUMOR MARKERS: No results for input(s): AFPTM, CEA, CA199, CHROMGRNA in the last 8760 hours.  Assessment and Plan:  75 yo female with a history of protracted hospital course in June 2017 secondary to life-threatening abdominal hemorrhage.  She is status post covered stent repair of injury of the left external iliac artery, retrievable IVC filter placement given her pulmonary embolism and giant atrial thrombus, and is also status post catheter directed lysis of giant atrial thrombus.  Our discussion today centered on:  1- surveillance of lower extremity arterial system, status post covered stent. Noninvasive exam is normal today. My feeling is that her ongoing left hip pain is unrelated to vascular compromise, given the absence of typical claudication symptoms. I feel that an annual surveillance ABI/noninvasive exam is reasonable given the stent in the external iliac artery.  2- retrievable IVC filter. My impression is that the risk of performing the filter retrieval is much lower than the long-term risk of considering this a permanent filter, given the risk of ilio caval thrombus, fracture, migration. She is not ready at this time to discuss retrieval, which would not happen until 6  months after placement, sometime in December or January. We can readdress this at another date, but I do think she is a candidate for retrieval.  3- residual intra-abdominal hemorrhage. Prior CT July 12 demonstrated significant hematoma remaining in the pelvis. This may be contributing to her symptoms of urinary frequency. I did offer her CT-guided drainage/aspiration, as a test to see if this decreases her urinary symptoms before any referral to urology. By now, there should be significant liquefaction of the hematoma that may be amenable to aspiration. Again, she feels that she can address this should be symptoms continue.  For now we will plan on seeing her on an as-needed basis and when she is willing to discuss filter retrieval or the aspiration. Until then we will plan on a 1 year surveillance noninvasive study of the lower extremity.  Electronically Signed: Corrie Mckusick 04/06/2016, 5:41 PM  I spent a total of    25 Minutes in face to face in clinical consultation, greater than 50% of which was counseling/coordinating care for pulmonary embolism, arterial injury status post endovascular repair, status post catheter directed atrial thrombus lysis, retrievable IVC filter placement.

## 2016-04-07 DIAGNOSIS — S72002D Fracture of unspecified part of neck of left femur, subsequent encounter for closed fracture with routine healing: Secondary | ICD-10-CM | POA: Diagnosis not present

## 2016-04-09 DIAGNOSIS — S72002D Fracture of unspecified part of neck of left femur, subsequent encounter for closed fracture with routine healing: Secondary | ICD-10-CM | POA: Diagnosis not present

## 2016-04-09 DIAGNOSIS — R339 Retention of urine, unspecified: Secondary | ICD-10-CM | POA: Diagnosis not present

## 2016-04-09 DIAGNOSIS — I82413 Acute embolism and thrombosis of femoral vein, bilateral: Secondary | ICD-10-CM | POA: Diagnosis not present

## 2016-04-09 DIAGNOSIS — G8929 Other chronic pain: Secondary | ICD-10-CM | POA: Diagnosis not present

## 2016-04-09 DIAGNOSIS — R69 Illness, unspecified: Secondary | ICD-10-CM | POA: Diagnosis not present

## 2016-04-09 DIAGNOSIS — R1312 Dysphagia, oropharyngeal phase: Secondary | ICD-10-CM | POA: Diagnosis not present

## 2016-04-09 DIAGNOSIS — I2699 Other pulmonary embolism without acute cor pulmonale: Secondary | ICD-10-CM | POA: Diagnosis not present

## 2016-04-09 DIAGNOSIS — M545 Low back pain: Secondary | ICD-10-CM | POA: Diagnosis not present

## 2016-04-09 DIAGNOSIS — Z7901 Long term (current) use of anticoagulants: Secondary | ICD-10-CM | POA: Diagnosis not present

## 2016-04-09 DIAGNOSIS — I480 Paroxysmal atrial fibrillation: Secondary | ICD-10-CM | POA: Diagnosis not present

## 2016-04-13 DIAGNOSIS — S72002D Fracture of unspecified part of neck of left femur, subsequent encounter for closed fracture with routine healing: Secondary | ICD-10-CM | POA: Diagnosis not present

## 2016-04-13 DIAGNOSIS — R1312 Dysphagia, oropharyngeal phase: Secondary | ICD-10-CM | POA: Diagnosis not present

## 2016-04-13 DIAGNOSIS — M545 Low back pain: Secondary | ICD-10-CM | POA: Diagnosis not present

## 2016-04-13 DIAGNOSIS — I480 Paroxysmal atrial fibrillation: Secondary | ICD-10-CM | POA: Diagnosis not present

## 2016-04-13 DIAGNOSIS — R339 Retention of urine, unspecified: Secondary | ICD-10-CM | POA: Diagnosis not present

## 2016-04-13 DIAGNOSIS — G8929 Other chronic pain: Secondary | ICD-10-CM | POA: Diagnosis not present

## 2016-04-13 DIAGNOSIS — R69 Illness, unspecified: Secondary | ICD-10-CM | POA: Diagnosis not present

## 2016-04-13 DIAGNOSIS — Z7901 Long term (current) use of anticoagulants: Secondary | ICD-10-CM | POA: Diagnosis not present

## 2016-04-13 DIAGNOSIS — I82413 Acute embolism and thrombosis of femoral vein, bilateral: Secondary | ICD-10-CM | POA: Diagnosis not present

## 2016-04-13 DIAGNOSIS — I2699 Other pulmonary embolism without acute cor pulmonale: Secondary | ICD-10-CM | POA: Diagnosis not present

## 2016-04-14 ENCOUNTER — Encounter: Payer: Self-pay | Admitting: Physical Medicine & Rehabilitation

## 2016-04-14 ENCOUNTER — Encounter: Payer: Medicare HMO | Attending: Physical Medicine & Rehabilitation | Admitting: Physical Medicine & Rehabilitation

## 2016-04-14 VITALS — BP 112/78 | HR 90 | Resp 14

## 2016-04-14 DIAGNOSIS — M79652 Pain in left thigh: Secondary | ICD-10-CM

## 2016-04-14 DIAGNOSIS — R269 Unspecified abnormalities of gait and mobility: Secondary | ICD-10-CM | POA: Diagnosis not present

## 2016-04-14 DIAGNOSIS — M47816 Spondylosis without myelopathy or radiculopathy, lumbar region: Secondary | ICD-10-CM | POA: Insufficient documentation

## 2016-04-14 DIAGNOSIS — R52 Pain, unspecified: Secondary | ICD-10-CM

## 2016-04-14 DIAGNOSIS — E876 Hypokalemia: Secondary | ICD-10-CM | POA: Diagnosis not present

## 2016-04-14 DIAGNOSIS — Z86718 Personal history of other venous thrombosis and embolism: Secondary | ICD-10-CM | POA: Diagnosis not present

## 2016-04-14 DIAGNOSIS — R531 Weakness: Secondary | ICD-10-CM | POA: Diagnosis not present

## 2016-04-14 DIAGNOSIS — M25552 Pain in left hip: Secondary | ICD-10-CM | POA: Diagnosis not present

## 2016-04-14 DIAGNOSIS — G8929 Other chronic pain: Secondary | ICD-10-CM | POA: Diagnosis not present

## 2016-04-14 DIAGNOSIS — R350 Frequency of micturition: Secondary | ICD-10-CM | POA: Diagnosis not present

## 2016-04-14 MED ORDER — OXYCODONE-ACETAMINOPHEN 5-325 MG PO TABS: 0.5000 | ORAL_TABLET | Freq: Two times a day (BID) | ORAL | 0 refills | Status: AC | PRN

## 2016-04-14 MED ORDER — DICLOFENAC SODIUM 50 MG PO TBEC: 50.0000 mg | DELAYED_RELEASE_TABLET | Freq: Two times a day (BID) | ORAL | 3 refills | Status: AC

## 2016-04-14 NOTE — Progress Notes (Signed)
Subjective:    Patient ID: Bonnie Salazar, female    DOB: 13-Jan-1941, 75 y.o.   MRN: [id number]  HPI   Bonnie Salazar is back regarding her gait disorder and ongoing low back and left hip pain. She didn't have any relief with the TPI and stretching of her left hip only makes symptoms worse. She had an xray done at [facility] and it appears to show advance scoilosis and spondylosis of the lower lumbar spine (poor reprint).   I reviewed an MRI from 04/2014 which revealed:  L2-L3: Interval progression of disc space loss and circumferential disc osteophyte complex. Progressed facet and ligament flavum hypertrophy. There is new moderate right lateral recess stenosis (series 6, image 17). No significant spinal stenosis. Mild right L2 foraminal stenosis has also increased.  L3-L4: Chronic severe disc space loss and right eccentric circumferential disc osteophyte complex. Increased right subarticular disc material (series 6, image 22) with increased chronic right lateral recess stenosis, now severe. Mild facet hypertrophy appears stable. No spinal stenosis. Stable mild right L3 foraminal stenosis.  L4-L5: Interval disc space loss and progressed left eccentric circumferential disc bulge. Progressed moderate facet and ligament flavum hypertrophy. Increased bilateral lateral recess stenosis, moderate on the left and mild on the right. Minimal to mild spinal stenosis. Severe left L4 foraminal stenosis has progressed (series 4, image 12).  Pain is worse with lying on her back or sitting. She does a little better with side lying. Pain is in the low back and left buttock with some radiation down the left leg, +/- left foot. She also complains of a "band like" sensation at the tops of both of her feet. (she had complained of this while in the hospital  The mobic did not help.   Bladder seems to be slowly improving. She is followed by urology.    Pain Inventory Average Pain 6 Pain  Right Now 7 My pain is dull  In the last 24 hours, has pain interfered with the following? General activity 8 Relation with others 8 Enjoyment of life 8 What TIME of day is your pain at its worst? daytime, evening Sleep (in general) Poor  Pain is worse with: sitting and some activites Pain improves with: rest and heat/ice Relief from Meds: 2  Mobility use a cane use a walker  Function retired  Neuro/Psych bladder control problems  Prior Studies Any changes since last visit?  no  Physicians involved in your care Any changes since last visit?  no   Family History  Problem Relation Age of Onset  . Hodgkin's lymphoma Mother   . Stroke Father   . Coronary artery disease Father     in his late 99s   Social History   Social History  . Marital status: Married    Spouse name: N/A  . Number of children: N/A  . Years of education: N/A   Occupational History  . Retired Estate manager/land agent    Social History Main Topics  . Smoking status: Former Research scientist (life sciences)  . Smokeless tobacco: Former Systems developer    Quit date: 12/11/1995  . Alcohol use No  . Drug use: No  . Sexual activity: Not on file   Other Topics Concern  . Not on file   Social History Narrative   Pt lives with her husband, walks daily.   Past Surgical History:  Procedure Laterality Date  . BACK SURGERY    . IVC FILTER PLACEMENT ([facility] HX)    . TOTAL HIP ARTHROPLASTY  Left 12/26/2015   Procedure: TOTAL HIP ARTHROPLASTY ANTERIOR APPROACH;  Surgeon: Renette Butters, MD;  Location: [facility];  Service: Orthopedics;  Laterality: Left;   Past Medical History:  Diagnosis Date  . Acute pulmonary embolism ([place]) 12/28/2015  . Acute respiratory failure with hypoxia ([place]) 01/2016  . Arterial hemorrhage 01/2016  . Atrial mass 01/2016  . Cardiac arrest ([place]) 12/2015  . Female pelvic hematoma 01/2016  . Fracture of femoral neck, left ([place]) 01/2016  . Hypernatremia 01/2016  . Right atrial thrombus ([place]) 01/2016   BP 112/78   Pulse 90    Resp 14   SpO2 95%   Opioid Risk Score:   Fall Risk Score:  `1  Depression screen PHQ 2/9  Depression screen [facility] 2/9 03/17/2016 02/11/2016  Decreased Interest 1 0  Down, Depressed, Hopeless 1 0  PHQ - 2 Score 2 0  Altered sleeping - 3  Tired, decreased energy - 0  Change in appetite - 0  Feeling bad or failure about yourself  - 0  Trouble concentrating - 0  Moving slowly or fidgety/restless - 0  Suicidal thoughts - 0  PHQ-9 Score - 3    Review of Systems  All other systems reviewed and are negative.      Objective:   Physical Exam  Constitutional: She appears well-developed and well-nourished. NAD.  HENT: Normocephalic and atraumatic.  Eyes: Conjunctivae and EOM are normal.  Cardiovascular: Normal rate and regular rhythm.no MRG Respiratory: Effort normal and breath sounds normal. No stridor. No respiratory distress. She has no wheezes.  GI: + BS.  Musculoskeletal: right lumbar dextroscoliosis. Lower ribs almost meet right iliac crest Edema trace at most LLE Neurological: She is alert and oriented.  Ambuilates with improved balance. No sensory signs. SLR equivocal. Remains tender in the left buttock/PSIS area around to the left greater troch. Motor: B/l UE: 4+/5 proximal to distal RLE: Hip flexion 4/5, knee extension 4/5, ankle dorsi/plantar flexion 5/5 LLE: Hip flexion 3+ to 4/5, knee extension 4/5, ankle dorsi/plantar flexion 5/5 Skin: Skin is warm and dry.  Psychiatric: affect more dynamic. Cognition and memory are normal.     Assessment & Plan:  Medical Problem List and Plan: 1.Abnormality of gait, weakness, low endurance secondary to debility after left hip fracture complicated by arterial bleed, hemorrhagic shock, bilateral PE, PEA,Atrial thrombus, GIB. medical issues  2.Bilateral PE/DVT: IVC filter in place--    3. Left hip pain/low back pain. Advanced lumbar  spondylosis/scoliosis  -likely some referred pain from lower lumbar spine/sacrum. Don't see clear radicular patterns.                        -will send for MRI  -percocet 5/325 for study -continue PT/HEP for basic posture and ROM             -           -diclofenac 50mg  BID.  10 GIB:H/H stable. Continue PPI.  -no clinical signs of bleeding.                         -careful observation while on meloxicam  11. ABLA: Has been treated with multiple units PRBC post arterial hemorrhage/GIB.  17. Freq urination- due to hematoma -urology referral made   Thirty minutes+of face to face patient care time were spent during this visit. All questions were encouraged and answered. Follow up in one month.

## 2016-04-14 NOTE — Patient Instructions (Signed)
PLEASE CALL ME WITH ANY PROBLEMS OR QUESTIONS ([phone])  

## 2016-04-15 DIAGNOSIS — R69 Illness, unspecified: Secondary | ICD-10-CM | POA: Diagnosis not present

## 2016-04-15 DIAGNOSIS — Z7901 Long term (current) use of anticoagulants: Secondary | ICD-10-CM | POA: Diagnosis not present

## 2016-04-15 DIAGNOSIS — I480 Paroxysmal atrial fibrillation: Secondary | ICD-10-CM | POA: Diagnosis not present

## 2016-04-15 DIAGNOSIS — R1312 Dysphagia, oropharyngeal phase: Secondary | ICD-10-CM | POA: Diagnosis not present

## 2016-04-15 DIAGNOSIS — I82413 Acute embolism and thrombosis of femoral vein, bilateral: Secondary | ICD-10-CM | POA: Diagnosis not present

## 2016-04-15 DIAGNOSIS — G8929 Other chronic pain: Secondary | ICD-10-CM | POA: Diagnosis not present

## 2016-04-15 DIAGNOSIS — I2699 Other pulmonary embolism without acute cor pulmonale: Secondary | ICD-10-CM | POA: Diagnosis not present

## 2016-04-15 DIAGNOSIS — M545 Low back pain: Secondary | ICD-10-CM | POA: Diagnosis not present

## 2016-04-15 DIAGNOSIS — R339 Retention of urine, unspecified: Secondary | ICD-10-CM | POA: Diagnosis not present

## 2016-04-15 DIAGNOSIS — S72002D Fracture of unspecified part of neck of left femur, subsequent encounter for closed fracture with routine healing: Secondary | ICD-10-CM | POA: Diagnosis not present

## 2016-04-20 DIAGNOSIS — R339 Retention of urine, unspecified: Secondary | ICD-10-CM | POA: Diagnosis not present

## 2016-04-20 DIAGNOSIS — R69 Illness, unspecified: Secondary | ICD-10-CM | POA: Diagnosis not present

## 2016-04-20 DIAGNOSIS — Z7901 Long term (current) use of anticoagulants: Secondary | ICD-10-CM | POA: Diagnosis not present

## 2016-04-20 DIAGNOSIS — I480 Paroxysmal atrial fibrillation: Secondary | ICD-10-CM | POA: Diagnosis not present

## 2016-04-20 DIAGNOSIS — G8929 Other chronic pain: Secondary | ICD-10-CM | POA: Diagnosis not present

## 2016-04-20 DIAGNOSIS — M545 Low back pain: Secondary | ICD-10-CM | POA: Diagnosis not present

## 2016-04-20 DIAGNOSIS — I82413 Acute embolism and thrombosis of femoral vein, bilateral: Secondary | ICD-10-CM | POA: Diagnosis not present

## 2016-04-20 DIAGNOSIS — R1312 Dysphagia, oropharyngeal phase: Secondary | ICD-10-CM | POA: Diagnosis not present

## 2016-04-20 DIAGNOSIS — S72002D Fracture of unspecified part of neck of left femur, subsequent encounter for closed fracture with routine healing: Secondary | ICD-10-CM | POA: Diagnosis not present

## 2016-04-20 DIAGNOSIS — I2699 Other pulmonary embolism without acute cor pulmonale: Secondary | ICD-10-CM | POA: Diagnosis not present

## 2016-04-22 DIAGNOSIS — Z7901 Long term (current) use of anticoagulants: Secondary | ICD-10-CM | POA: Diagnosis not present

## 2016-04-22 DIAGNOSIS — R69 Illness, unspecified: Secondary | ICD-10-CM | POA: Diagnosis not present

## 2016-04-22 DIAGNOSIS — I2699 Other pulmonary embolism without acute cor pulmonale: Secondary | ICD-10-CM | POA: Diagnosis not present

## 2016-04-22 DIAGNOSIS — M545 Low back pain: Secondary | ICD-10-CM | POA: Diagnosis not present

## 2016-04-22 DIAGNOSIS — R1312 Dysphagia, oropharyngeal phase: Secondary | ICD-10-CM | POA: Diagnosis not present

## 2016-04-22 DIAGNOSIS — I480 Paroxysmal atrial fibrillation: Secondary | ICD-10-CM | POA: Diagnosis not present

## 2016-04-22 DIAGNOSIS — R339 Retention of urine, unspecified: Secondary | ICD-10-CM | POA: Diagnosis not present

## 2016-04-22 DIAGNOSIS — S72002D Fracture of unspecified part of neck of left femur, subsequent encounter for closed fracture with routine healing: Secondary | ICD-10-CM | POA: Diagnosis not present

## 2016-04-22 DIAGNOSIS — G8929 Other chronic pain: Secondary | ICD-10-CM | POA: Diagnosis not present

## 2016-04-22 DIAGNOSIS — I82413 Acute embolism and thrombosis of femoral vein, bilateral: Secondary | ICD-10-CM | POA: Diagnosis not present

## 2016-04-26 DIAGNOSIS — I82413 Acute embolism and thrombosis of femoral vein, bilateral: Secondary | ICD-10-CM | POA: Diagnosis not present

## 2016-04-26 DIAGNOSIS — R339 Retention of urine, unspecified: Secondary | ICD-10-CM | POA: Diagnosis not present

## 2016-04-26 DIAGNOSIS — I2699 Other pulmonary embolism without acute cor pulmonale: Secondary | ICD-10-CM | POA: Diagnosis not present

## 2016-04-26 DIAGNOSIS — R69 Illness, unspecified: Secondary | ICD-10-CM | POA: Diagnosis not present

## 2016-04-26 DIAGNOSIS — G8929 Other chronic pain: Secondary | ICD-10-CM | POA: Diagnosis not present

## 2016-04-26 DIAGNOSIS — M545 Low back pain: Secondary | ICD-10-CM | POA: Diagnosis not present

## 2016-04-26 DIAGNOSIS — S72002D Fracture of unspecified part of neck of left femur, subsequent encounter for closed fracture with routine healing: Secondary | ICD-10-CM | POA: Diagnosis not present

## 2016-04-26 DIAGNOSIS — I480 Paroxysmal atrial fibrillation: Secondary | ICD-10-CM | POA: Diagnosis not present

## 2016-04-26 DIAGNOSIS — Z7901 Long term (current) use of anticoagulants: Secondary | ICD-10-CM | POA: Diagnosis not present

## 2016-04-26 DIAGNOSIS — R1312 Dysphagia, oropharyngeal phase: Secondary | ICD-10-CM | POA: Diagnosis not present

## 2016-04-29 DIAGNOSIS — R69 Illness, unspecified: Secondary | ICD-10-CM | POA: Diagnosis not present

## 2016-04-29 DIAGNOSIS — Z7901 Long term (current) use of anticoagulants: Secondary | ICD-10-CM | POA: Diagnosis not present

## 2016-04-29 DIAGNOSIS — R339 Retention of urine, unspecified: Secondary | ICD-10-CM | POA: Diagnosis not present

## 2016-04-29 DIAGNOSIS — I2699 Other pulmonary embolism without acute cor pulmonale: Secondary | ICD-10-CM | POA: Diagnosis not present

## 2016-04-29 DIAGNOSIS — I480 Paroxysmal atrial fibrillation: Secondary | ICD-10-CM | POA: Diagnosis not present

## 2016-04-29 DIAGNOSIS — S72002D Fracture of unspecified part of neck of left femur, subsequent encounter for closed fracture with routine healing: Secondary | ICD-10-CM | POA: Diagnosis not present

## 2016-04-29 DIAGNOSIS — R1312 Dysphagia, oropharyngeal phase: Secondary | ICD-10-CM | POA: Diagnosis not present

## 2016-04-29 DIAGNOSIS — I82413 Acute embolism and thrombosis of femoral vein, bilateral: Secondary | ICD-10-CM | POA: Diagnosis not present

## 2016-04-29 DIAGNOSIS — G8929 Other chronic pain: Secondary | ICD-10-CM | POA: Diagnosis not present

## 2016-04-29 DIAGNOSIS — M545 Low back pain: Secondary | ICD-10-CM | POA: Diagnosis not present

## 2016-05-04 ENCOUNTER — Ambulatory Visit
Admission: RE | Admit: 2016-05-04 | Discharge: 2016-05-04 | Disposition: A | Discharge status: Home / Self Care | Payer: Medicare HMO | Source: Ambulatory Visit | Attending: Physical Medicine & Rehabilitation | Admitting: Physical Medicine & Rehabilitation

## 2016-05-04 DIAGNOSIS — R52 Pain, unspecified: Secondary | ICD-10-CM

## 2016-05-04 DIAGNOSIS — M79652 Pain in left thigh: Secondary | ICD-10-CM

## 2016-05-04 DIAGNOSIS — M5126 Other intervertebral disc displacement, lumbar region: Secondary | ICD-10-CM | POA: Diagnosis not present

## 2016-05-04 DIAGNOSIS — M47816 Spondylosis without myelopathy or radiculopathy, lumbar region: Secondary | ICD-10-CM

## 2016-05-06 DIAGNOSIS — M5136 Other intervertebral disc degeneration, lumbar region: Secondary | ICD-10-CM | POA: Diagnosis not present

## 2016-05-06 DIAGNOSIS — M5416 Radiculopathy, lumbar region: Secondary | ICD-10-CM | POA: Diagnosis not present

## 2016-05-10 DIAGNOSIS — M545 Low back pain: Secondary | ICD-10-CM | POA: Diagnosis not present

## 2016-05-10 DIAGNOSIS — M6281 Muscle weakness (generalized): Secondary | ICD-10-CM | POA: Diagnosis not present

## 2016-05-10 DIAGNOSIS — M25552 Pain in left hip: Secondary | ICD-10-CM | POA: Diagnosis not present

## 2016-05-11 DIAGNOSIS — M6281 Muscle weakness (generalized): Secondary | ICD-10-CM | POA: Diagnosis not present

## 2016-05-11 DIAGNOSIS — M545 Low back pain: Secondary | ICD-10-CM | POA: Diagnosis not present

## 2016-05-11 DIAGNOSIS — M25552 Pain in left hip: Secondary | ICD-10-CM | POA: Diagnosis not present

## 2016-05-12 DIAGNOSIS — M6281 Muscle weakness (generalized): Secondary | ICD-10-CM | POA: Diagnosis not present

## 2016-05-12 DIAGNOSIS — M545 Low back pain: Secondary | ICD-10-CM | POA: Diagnosis not present

## 2016-05-12 DIAGNOSIS — M25552 Pain in left hip: Secondary | ICD-10-CM | POA: Diagnosis not present

## 2016-05-17 DIAGNOSIS — M545 Low back pain: Secondary | ICD-10-CM | POA: Diagnosis not present

## 2016-05-17 DIAGNOSIS — M25552 Pain in left hip: Secondary | ICD-10-CM | POA: Diagnosis not present

## 2016-05-17 DIAGNOSIS — M6281 Muscle weakness (generalized): Secondary | ICD-10-CM | POA: Diagnosis not present

## 2016-05-19 DIAGNOSIS — M6281 Muscle weakness (generalized): Secondary | ICD-10-CM | POA: Diagnosis not present

## 2016-05-19 DIAGNOSIS — M545 Low back pain: Secondary | ICD-10-CM | POA: Diagnosis not present

## 2016-05-19 DIAGNOSIS — M25552 Pain in left hip: Secondary | ICD-10-CM | POA: Diagnosis not present

## 2016-05-20 DIAGNOSIS — M545 Low back pain: Secondary | ICD-10-CM | POA: Diagnosis not present

## 2016-05-20 DIAGNOSIS — M25552 Pain in left hip: Secondary | ICD-10-CM | POA: Diagnosis not present

## 2016-05-20 DIAGNOSIS — M6281 Muscle weakness (generalized): Secondary | ICD-10-CM | POA: Diagnosis not present

## 2016-05-24 ENCOUNTER — Telehealth: Payer: Self-pay | Admitting: Physical Medicine & Rehabilitation

## 2016-05-24 NOTE — Telephone Encounter (Signed)
She is going to a Chief of Staff about her back and he read her MRi and breakthrough threrapy as well.  She was feeling that at this time she did not need to come back but she needed to make sure.  She is not taking pain medication (only received # 10 pills in October) and so she will call back and reschedule if she needs Dr Naaman Plummer.

## 2016-05-24 NOTE — Telephone Encounter (Signed)
patient called to cancel upcoming appt next week - patient did not want to r/s at this time until speaking with a nurse - has a few questions (( wouldnt specify)) - and what information she receives will determine on if she reschedules her appt.

## 2016-05-25 DIAGNOSIS — M25552 Pain in left hip: Secondary | ICD-10-CM | POA: Diagnosis not present

## 2016-05-25 DIAGNOSIS — M6281 Muscle weakness (generalized): Secondary | ICD-10-CM | POA: Diagnosis not present

## 2016-05-25 DIAGNOSIS — M545 Low back pain: Secondary | ICD-10-CM | POA: Diagnosis not present

## 2016-05-27 DIAGNOSIS — M25552 Pain in left hip: Secondary | ICD-10-CM | POA: Diagnosis not present

## 2016-05-27 DIAGNOSIS — M6281 Muscle weakness (generalized): Secondary | ICD-10-CM | POA: Diagnosis not present

## 2016-05-27 DIAGNOSIS — M545 Low back pain: Secondary | ICD-10-CM | POA: Diagnosis not present

## 2016-05-31 ENCOUNTER — Encounter: Payer: Medicare HMO | Admitting: Physical Medicine & Rehabilitation

## 2016-05-31 DIAGNOSIS — M6281 Muscle weakness (generalized): Secondary | ICD-10-CM | POA: Diagnosis not present

## 2016-05-31 DIAGNOSIS — M545 Low back pain: Secondary | ICD-10-CM | POA: Diagnosis not present

## 2016-05-31 DIAGNOSIS — M25552 Pain in left hip: Secondary | ICD-10-CM | POA: Diagnosis not present

## 2016-06-02 DIAGNOSIS — M25552 Pain in left hip: Secondary | ICD-10-CM | POA: Diagnosis not present

## 2016-06-02 DIAGNOSIS — M6281 Muscle weakness (generalized): Secondary | ICD-10-CM | POA: Diagnosis not present

## 2016-06-02 DIAGNOSIS — M545 Low back pain: Secondary | ICD-10-CM | POA: Diagnosis not present

## 2016-06-09 DIAGNOSIS — M25552 Pain in left hip: Secondary | ICD-10-CM | POA: Diagnosis not present

## 2016-06-09 DIAGNOSIS — M6281 Muscle weakness (generalized): Secondary | ICD-10-CM | POA: Diagnosis not present

## 2016-06-09 DIAGNOSIS — M545 Low back pain: Secondary | ICD-10-CM | POA: Diagnosis not present

## 2016-06-10 DIAGNOSIS — M6281 Muscle weakness (generalized): Secondary | ICD-10-CM | POA: Diagnosis not present

## 2016-06-10 DIAGNOSIS — M25552 Pain in left hip: Secondary | ICD-10-CM | POA: Diagnosis not present

## 2016-06-10 DIAGNOSIS — M545 Low back pain: Secondary | ICD-10-CM | POA: Diagnosis not present

## 2016-06-15 DIAGNOSIS — M545 Low back pain: Secondary | ICD-10-CM | POA: Diagnosis not present

## 2016-06-15 DIAGNOSIS — M25552 Pain in left hip: Secondary | ICD-10-CM | POA: Diagnosis not present

## 2016-06-15 DIAGNOSIS — M6281 Muscle weakness (generalized): Secondary | ICD-10-CM | POA: Diagnosis not present

## 2016-06-17 DIAGNOSIS — M6281 Muscle weakness (generalized): Secondary | ICD-10-CM | POA: Diagnosis not present

## 2016-06-17 DIAGNOSIS — M545 Low back pain: Secondary | ICD-10-CM | POA: Diagnosis not present

## 2016-06-17 DIAGNOSIS — M25552 Pain in left hip: Secondary | ICD-10-CM | POA: Diagnosis not present

## 2016-06-22 DIAGNOSIS — M25552 Pain in left hip: Secondary | ICD-10-CM | POA: Diagnosis not present

## 2016-06-22 DIAGNOSIS — M6281 Muscle weakness (generalized): Secondary | ICD-10-CM | POA: Diagnosis not present

## 2016-06-22 DIAGNOSIS — M545 Low back pain: Secondary | ICD-10-CM | POA: Diagnosis not present

## 2016-06-24 DIAGNOSIS — M6281 Muscle weakness (generalized): Secondary | ICD-10-CM | POA: Diagnosis not present

## 2016-06-24 DIAGNOSIS — M25552 Pain in left hip: Secondary | ICD-10-CM | POA: Diagnosis not present

## 2016-06-24 DIAGNOSIS — M545 Low back pain: Secondary | ICD-10-CM | POA: Diagnosis not present

## 2016-06-30 DIAGNOSIS — M545 Low back pain: Secondary | ICD-10-CM | POA: Diagnosis not present

## 2016-06-30 DIAGNOSIS — M25552 Pain in left hip: Secondary | ICD-10-CM | POA: Diagnosis not present

## 2016-06-30 DIAGNOSIS — M6281 Muscle weakness (generalized): Secondary | ICD-10-CM | POA: Diagnosis not present

## 2016-07-01 DIAGNOSIS — M545 Low back pain: Secondary | ICD-10-CM | POA: Diagnosis not present

## 2016-07-01 DIAGNOSIS — M25552 Pain in left hip: Secondary | ICD-10-CM | POA: Diagnosis not present

## 2016-07-01 DIAGNOSIS — M6281 Muscle weakness (generalized): Secondary | ICD-10-CM | POA: Diagnosis not present

## 2016-07-06 DIAGNOSIS — M6281 Muscle weakness (generalized): Secondary | ICD-10-CM | POA: Diagnosis not present

## 2016-07-06 DIAGNOSIS — M25552 Pain in left hip: Secondary | ICD-10-CM | POA: Diagnosis not present

## 2016-07-06 DIAGNOSIS — M545 Low back pain: Secondary | ICD-10-CM | POA: Diagnosis not present

## 2016-07-08 DIAGNOSIS — M6281 Muscle weakness (generalized): Secondary | ICD-10-CM | POA: Diagnosis not present

## 2016-07-08 DIAGNOSIS — M545 Low back pain: Secondary | ICD-10-CM | POA: Diagnosis not present

## 2016-07-08 DIAGNOSIS — M25552 Pain in left hip: Secondary | ICD-10-CM | POA: Diagnosis not present

## 2016-07-13 DIAGNOSIS — M545 Low back pain: Secondary | ICD-10-CM | POA: Diagnosis not present

## 2016-07-13 DIAGNOSIS — M6281 Muscle weakness (generalized): Secondary | ICD-10-CM | POA: Diagnosis not present

## 2016-07-13 DIAGNOSIS — M25552 Pain in left hip: Secondary | ICD-10-CM | POA: Diagnosis not present

## 2016-07-15 ENCOUNTER — Encounter: Payer: Self-pay | Admitting: Interventional Radiology

## 2016-07-15 DIAGNOSIS — M6281 Muscle weakness (generalized): Secondary | ICD-10-CM | POA: Diagnosis not present

## 2016-07-15 DIAGNOSIS — M545 Low back pain: Secondary | ICD-10-CM | POA: Diagnosis not present

## 2016-07-15 DIAGNOSIS — M25552 Pain in left hip: Secondary | ICD-10-CM | POA: Diagnosis not present

## 2016-07-20 DIAGNOSIS — M6281 Muscle weakness (generalized): Secondary | ICD-10-CM | POA: Diagnosis not present

## 2016-07-20 DIAGNOSIS — M545 Low back pain: Secondary | ICD-10-CM | POA: Diagnosis not present

## 2016-07-20 DIAGNOSIS — M25552 Pain in left hip: Secondary | ICD-10-CM | POA: Diagnosis not present

## 2016-07-22 DIAGNOSIS — M6281 Muscle weakness (generalized): Secondary | ICD-10-CM | POA: Diagnosis not present

## 2016-07-22 DIAGNOSIS — M545 Low back pain: Secondary | ICD-10-CM | POA: Diagnosis not present

## 2016-07-22 DIAGNOSIS — M25552 Pain in left hip: Secondary | ICD-10-CM | POA: Diagnosis not present

## 2016-07-26 DIAGNOSIS — M6281 Muscle weakness (generalized): Secondary | ICD-10-CM | POA: Diagnosis not present

## 2016-07-26 DIAGNOSIS — M25552 Pain in left hip: Secondary | ICD-10-CM | POA: Diagnosis not present

## 2016-07-26 DIAGNOSIS — M545 Low back pain: Secondary | ICD-10-CM | POA: Diagnosis not present

## 2016-08-03 DIAGNOSIS — M545 Low back pain: Secondary | ICD-10-CM | POA: Diagnosis not present

## 2016-08-03 DIAGNOSIS — M25552 Pain in left hip: Secondary | ICD-10-CM | POA: Diagnosis not present

## 2016-08-03 DIAGNOSIS — M6281 Muscle weakness (generalized): Secondary | ICD-10-CM | POA: Diagnosis not present

## 2016-08-05 DIAGNOSIS — M545 Low back pain: Secondary | ICD-10-CM | POA: Diagnosis not present

## 2016-08-05 DIAGNOSIS — M25552 Pain in left hip: Secondary | ICD-10-CM | POA: Diagnosis not present

## 2016-08-05 DIAGNOSIS — M6281 Muscle weakness (generalized): Secondary | ICD-10-CM | POA: Diagnosis not present

## 2016-08-10 DIAGNOSIS — M545 Low back pain: Secondary | ICD-10-CM | POA: Diagnosis not present

## 2016-08-10 DIAGNOSIS — M6281 Muscle weakness (generalized): Secondary | ICD-10-CM | POA: Diagnosis not present

## 2016-08-10 DIAGNOSIS — M25552 Pain in left hip: Secondary | ICD-10-CM | POA: Diagnosis not present

## 2016-08-12 DIAGNOSIS — M545 Low back pain: Secondary | ICD-10-CM | POA: Diagnosis not present

## 2016-08-12 DIAGNOSIS — M25552 Pain in left hip: Secondary | ICD-10-CM | POA: Diagnosis not present

## 2016-08-12 DIAGNOSIS — M6281 Muscle weakness (generalized): Secondary | ICD-10-CM | POA: Diagnosis not present

## 2016-08-16 DIAGNOSIS — M25552 Pain in left hip: Secondary | ICD-10-CM | POA: Diagnosis not present

## 2016-08-16 DIAGNOSIS — M545 Low back pain: Secondary | ICD-10-CM | POA: Diagnosis not present

## 2016-08-16 DIAGNOSIS — M6281 Muscle weakness (generalized): Secondary | ICD-10-CM | POA: Diagnosis not present

## 2016-08-17 DIAGNOSIS — H40013 Open angle with borderline findings, low risk, bilateral: Secondary | ICD-10-CM | POA: Diagnosis not present

## 2016-08-19 DIAGNOSIS — M6281 Muscle weakness (generalized): Secondary | ICD-10-CM | POA: Diagnosis not present

## 2016-08-19 DIAGNOSIS — M25552 Pain in left hip: Secondary | ICD-10-CM | POA: Diagnosis not present

## 2016-08-19 DIAGNOSIS — M545 Low back pain: Secondary | ICD-10-CM | POA: Diagnosis not present

## 2016-09-02 DIAGNOSIS — M25552 Pain in left hip: Secondary | ICD-10-CM | POA: Diagnosis not present

## 2016-09-02 DIAGNOSIS — M545 Low back pain: Secondary | ICD-10-CM | POA: Diagnosis not present

## 2016-09-02 DIAGNOSIS — M6281 Muscle weakness (generalized): Secondary | ICD-10-CM | POA: Diagnosis not present

## 2016-09-06 DIAGNOSIS — M545 Low back pain: Secondary | ICD-10-CM | POA: Diagnosis not present

## 2016-09-06 DIAGNOSIS — M25552 Pain in left hip: Secondary | ICD-10-CM | POA: Diagnosis not present

## 2016-09-06 DIAGNOSIS — M6281 Muscle weakness (generalized): Secondary | ICD-10-CM | POA: Diagnosis not present

## 2016-09-08 DIAGNOSIS — M25552 Pain in left hip: Secondary | ICD-10-CM | POA: Diagnosis not present

## 2016-09-08 DIAGNOSIS — M6281 Muscle weakness (generalized): Secondary | ICD-10-CM | POA: Diagnosis not present

## 2016-09-08 DIAGNOSIS — M545 Low back pain: Secondary | ICD-10-CM | POA: Diagnosis not present

## 2016-09-14 DIAGNOSIS — M545 Low back pain: Secondary | ICD-10-CM | POA: Diagnosis not present

## 2016-09-14 DIAGNOSIS — M6281 Muscle weakness (generalized): Secondary | ICD-10-CM | POA: Diagnosis not present

## 2016-09-14 DIAGNOSIS — M25552 Pain in left hip: Secondary | ICD-10-CM | POA: Diagnosis not present

## 2016-09-16 DIAGNOSIS — M6281 Muscle weakness (generalized): Secondary | ICD-10-CM | POA: Diagnosis not present

## 2016-09-16 DIAGNOSIS — M545 Low back pain: Secondary | ICD-10-CM | POA: Diagnosis not present

## 2016-09-16 DIAGNOSIS — M25552 Pain in left hip: Secondary | ICD-10-CM | POA: Diagnosis not present

## 2016-10-26 DIAGNOSIS — L814 Other melanin hyperpigmentation: Secondary | ICD-10-CM | POA: Diagnosis not present

## 2016-10-26 DIAGNOSIS — Z85828 Personal history of other malignant neoplasm of skin: Secondary | ICD-10-CM | POA: Diagnosis not present

## 2016-10-26 DIAGNOSIS — L723 Sebaceous cyst: Secondary | ICD-10-CM | POA: Diagnosis not present

## 2016-10-26 DIAGNOSIS — D1801 Hemangioma of skin and subcutaneous tissue: Secondary | ICD-10-CM | POA: Diagnosis not present

## 2016-10-26 DIAGNOSIS — L821 Other seborrheic keratosis: Secondary | ICD-10-CM | POA: Diagnosis not present

## 2016-10-26 DIAGNOSIS — D225 Melanocytic nevi of trunk: Secondary | ICD-10-CM | POA: Diagnosis not present

## 2016-10-26 DIAGNOSIS — D485 Neoplasm of uncertain behavior of skin: Secondary | ICD-10-CM | POA: Diagnosis not present

## 2016-11-02 DIAGNOSIS — M545 Low back pain: Secondary | ICD-10-CM | POA: Diagnosis not present

## 2016-11-02 DIAGNOSIS — M25552 Pain in left hip: Secondary | ICD-10-CM | POA: Diagnosis not present

## 2016-11-02 DIAGNOSIS — M6281 Muscle weakness (generalized): Secondary | ICD-10-CM | POA: Diagnosis not present

## 2016-11-24 DIAGNOSIS — M545 Low back pain: Secondary | ICD-10-CM | POA: Diagnosis not present

## 2016-11-24 DIAGNOSIS — M6281 Muscle weakness (generalized): Secondary | ICD-10-CM | POA: Diagnosis not present

## 2016-11-24 DIAGNOSIS — M25552 Pain in left hip: Secondary | ICD-10-CM | POA: Diagnosis not present

## 2016-11-25 DIAGNOSIS — M6281 Muscle weakness (generalized): Secondary | ICD-10-CM | POA: Diagnosis not present

## 2016-11-25 DIAGNOSIS — M545 Low back pain: Secondary | ICD-10-CM | POA: Diagnosis not present

## 2016-11-25 DIAGNOSIS — M25552 Pain in left hip: Secondary | ICD-10-CM | POA: Diagnosis not present

## 2016-11-30 DIAGNOSIS — M545 Low back pain: Secondary | ICD-10-CM | POA: Diagnosis not present

## 2016-11-30 DIAGNOSIS — M6281 Muscle weakness (generalized): Secondary | ICD-10-CM | POA: Diagnosis not present

## 2016-11-30 DIAGNOSIS — M25552 Pain in left hip: Secondary | ICD-10-CM | POA: Diagnosis not present

## 2016-12-01 DIAGNOSIS — M6281 Muscle weakness (generalized): Secondary | ICD-10-CM | POA: Diagnosis not present

## 2016-12-01 DIAGNOSIS — M545 Low back pain: Secondary | ICD-10-CM | POA: Diagnosis not present

## 2016-12-01 DIAGNOSIS — M25552 Pain in left hip: Secondary | ICD-10-CM | POA: Diagnosis not present

## 2016-12-07 DIAGNOSIS — M6281 Muscle weakness (generalized): Secondary | ICD-10-CM | POA: Diagnosis not present

## 2016-12-07 DIAGNOSIS — M545 Low back pain: Secondary | ICD-10-CM | POA: Diagnosis not present

## 2016-12-07 DIAGNOSIS — M25552 Pain in left hip: Secondary | ICD-10-CM | POA: Diagnosis not present

## 2016-12-08 DIAGNOSIS — M25552 Pain in left hip: Secondary | ICD-10-CM | POA: Diagnosis not present

## 2016-12-08 DIAGNOSIS — M6281 Muscle weakness (generalized): Secondary | ICD-10-CM | POA: Diagnosis not present

## 2016-12-08 DIAGNOSIS — M545 Low back pain: Secondary | ICD-10-CM | POA: Diagnosis not present

## 2016-12-13 DIAGNOSIS — M545 Low back pain: Secondary | ICD-10-CM | POA: Diagnosis not present

## 2016-12-13 DIAGNOSIS — M6281 Muscle weakness (generalized): Secondary | ICD-10-CM | POA: Diagnosis not present

## 2016-12-13 DIAGNOSIS — M25552 Pain in left hip: Secondary | ICD-10-CM | POA: Diagnosis not present

## 2016-12-15 DIAGNOSIS — M545 Low back pain: Secondary | ICD-10-CM | POA: Diagnosis not present

## 2016-12-15 DIAGNOSIS — M6281 Muscle weakness (generalized): Secondary | ICD-10-CM | POA: Diagnosis not present

## 2016-12-15 DIAGNOSIS — M25552 Pain in left hip: Secondary | ICD-10-CM | POA: Diagnosis not present

## 2016-12-27 DIAGNOSIS — M6281 Muscle weakness (generalized): Secondary | ICD-10-CM | POA: Diagnosis not present

## 2016-12-27 DIAGNOSIS — M25552 Pain in left hip: Secondary | ICD-10-CM | POA: Diagnosis not present

## 2016-12-27 DIAGNOSIS — M545 Low back pain: Secondary | ICD-10-CM | POA: Diagnosis not present

## 2016-12-29 DIAGNOSIS — M545 Low back pain: Secondary | ICD-10-CM | POA: Diagnosis not present

## 2016-12-29 DIAGNOSIS — M6281 Muscle weakness (generalized): Secondary | ICD-10-CM | POA: Diagnosis not present

## 2016-12-29 DIAGNOSIS — M25552 Pain in left hip: Secondary | ICD-10-CM | POA: Diagnosis not present

## 2017-01-04 DIAGNOSIS — M545 Low back pain: Secondary | ICD-10-CM | POA: Diagnosis not present

## 2017-01-04 DIAGNOSIS — M25552 Pain in left hip: Secondary | ICD-10-CM | POA: Diagnosis not present

## 2017-01-04 DIAGNOSIS — M6281 Muscle weakness (generalized): Secondary | ICD-10-CM | POA: Diagnosis not present

## 2017-01-06 DIAGNOSIS — M25552 Pain in left hip: Secondary | ICD-10-CM | POA: Diagnosis not present

## 2017-01-06 DIAGNOSIS — M545 Low back pain: Secondary | ICD-10-CM | POA: Diagnosis not present

## 2017-01-06 DIAGNOSIS — M6281 Muscle weakness (generalized): Secondary | ICD-10-CM | POA: Diagnosis not present

## 2017-01-10 DIAGNOSIS — M6281 Muscle weakness (generalized): Secondary | ICD-10-CM | POA: Diagnosis not present

## 2017-01-10 DIAGNOSIS — M545 Low back pain: Secondary | ICD-10-CM | POA: Diagnosis not present

## 2017-01-10 DIAGNOSIS — M25552 Pain in left hip: Secondary | ICD-10-CM | POA: Diagnosis not present

## 2017-01-13 DIAGNOSIS — M25552 Pain in left hip: Secondary | ICD-10-CM | POA: Diagnosis not present

## 2017-01-13 DIAGNOSIS — M6281 Muscle weakness (generalized): Secondary | ICD-10-CM | POA: Diagnosis not present

## 2017-01-13 DIAGNOSIS — M545 Low back pain: Secondary | ICD-10-CM | POA: Diagnosis not present

## 2017-01-17 DIAGNOSIS — M6281 Muscle weakness (generalized): Secondary | ICD-10-CM | POA: Diagnosis not present

## 2017-01-17 DIAGNOSIS — M25552 Pain in left hip: Secondary | ICD-10-CM | POA: Diagnosis not present

## 2017-01-17 DIAGNOSIS — M545 Low back pain: Secondary | ICD-10-CM | POA: Diagnosis not present

## 2017-01-19 DIAGNOSIS — M545 Low back pain: Secondary | ICD-10-CM | POA: Diagnosis not present

## 2017-01-19 DIAGNOSIS — M25552 Pain in left hip: Secondary | ICD-10-CM | POA: Diagnosis not present

## 2017-01-19 DIAGNOSIS — M6281 Muscle weakness (generalized): Secondary | ICD-10-CM | POA: Diagnosis not present

## 2017-01-25 DIAGNOSIS — M6281 Muscle weakness (generalized): Secondary | ICD-10-CM | POA: Diagnosis not present

## 2017-01-25 DIAGNOSIS — M25552 Pain in left hip: Secondary | ICD-10-CM | POA: Diagnosis not present

## 2017-01-25 DIAGNOSIS — M545 Low back pain: Secondary | ICD-10-CM | POA: Diagnosis not present

## 2017-01-26 DIAGNOSIS — M25552 Pain in left hip: Secondary | ICD-10-CM | POA: Diagnosis not present

## 2017-01-26 DIAGNOSIS — M545 Low back pain: Secondary | ICD-10-CM | POA: Diagnosis not present

## 2017-01-26 DIAGNOSIS — M6281 Muscle weakness (generalized): Secondary | ICD-10-CM | POA: Diagnosis not present

## 2017-01-31 DIAGNOSIS — M545 Low back pain: Secondary | ICD-10-CM | POA: Diagnosis not present

## 2017-01-31 DIAGNOSIS — M6281 Muscle weakness (generalized): Secondary | ICD-10-CM | POA: Diagnosis not present

## 2017-01-31 DIAGNOSIS — M25552 Pain in left hip: Secondary | ICD-10-CM | POA: Diagnosis not present

## 2017-02-02 DIAGNOSIS — M25552 Pain in left hip: Secondary | ICD-10-CM | POA: Diagnosis not present

## 2017-02-02 DIAGNOSIS — M6281 Muscle weakness (generalized): Secondary | ICD-10-CM | POA: Diagnosis not present

## 2017-02-02 DIAGNOSIS — M545 Low back pain: Secondary | ICD-10-CM | POA: Diagnosis not present

## 2017-02-07 DIAGNOSIS — M25552 Pain in left hip: Secondary | ICD-10-CM | POA: Diagnosis not present

## 2017-02-07 DIAGNOSIS — M545 Low back pain: Secondary | ICD-10-CM | POA: Diagnosis not present

## 2017-02-07 DIAGNOSIS — M6281 Muscle weakness (generalized): Secondary | ICD-10-CM | POA: Diagnosis not present

## 2017-02-09 DIAGNOSIS — M545 Low back pain: Secondary | ICD-10-CM | POA: Diagnosis not present

## 2017-02-09 DIAGNOSIS — M25552 Pain in left hip: Secondary | ICD-10-CM | POA: Diagnosis not present

## 2017-02-09 DIAGNOSIS — M6281 Muscle weakness (generalized): Secondary | ICD-10-CM | POA: Diagnosis not present

## 2017-02-14 DIAGNOSIS — M6281 Muscle weakness (generalized): Secondary | ICD-10-CM | POA: Diagnosis not present

## 2017-02-14 DIAGNOSIS — M545 Low back pain: Secondary | ICD-10-CM | POA: Diagnosis not present

## 2017-02-14 DIAGNOSIS — M25552 Pain in left hip: Secondary | ICD-10-CM | POA: Diagnosis not present

## 2017-02-16 DIAGNOSIS — M25552 Pain in left hip: Secondary | ICD-10-CM | POA: Diagnosis not present

## 2017-02-16 DIAGNOSIS — M545 Low back pain: Secondary | ICD-10-CM | POA: Diagnosis not present

## 2017-02-16 DIAGNOSIS — M6281 Muscle weakness (generalized): Secondary | ICD-10-CM | POA: Diagnosis not present

## 2017-02-21 DIAGNOSIS — M25552 Pain in left hip: Secondary | ICD-10-CM | POA: Diagnosis not present

## 2017-02-21 DIAGNOSIS — M6281 Muscle weakness (generalized): Secondary | ICD-10-CM | POA: Diagnosis not present

## 2017-02-21 DIAGNOSIS — M545 Low back pain: Secondary | ICD-10-CM | POA: Diagnosis not present

## 2017-02-23 DIAGNOSIS — M6281 Muscle weakness (generalized): Secondary | ICD-10-CM | POA: Diagnosis not present

## 2017-02-23 DIAGNOSIS — M545 Low back pain: Secondary | ICD-10-CM | POA: Diagnosis not present

## 2017-02-23 DIAGNOSIS — M25552 Pain in left hip: Secondary | ICD-10-CM | POA: Diagnosis not present

## 2017-03-03 ENCOUNTER — Other Ambulatory Visit: Payer: Self-pay | Admitting: Interventional Radiology

## 2017-03-03 ENCOUNTER — Telehealth: Payer: Self-pay | Admitting: Radiology

## 2017-03-03 DIAGNOSIS — S35512D Injury of left iliac artery, subsequent encounter: Secondary | ICD-10-CM

## 2017-03-03 NOTE — Telephone Encounter (Signed)
Patient left voice mail stating that she is moving to [place] on 03/11/2017.  She further stated that she will be extremely busy preparing for her move and does not want to scheduled a follow up appointment with Dr. Earleen Newport.  Jeromiah Ohalloran Riki Rusk, [place] 03/03/2017 2:14 PM

## 2017-03-21 DIAGNOSIS — H2513 Age-related nuclear cataract, bilateral: Secondary | ICD-10-CM | POA: Diagnosis not present

## 2017-03-28 DIAGNOSIS — H2513 Age-related nuclear cataract, bilateral: Secondary | ICD-10-CM | POA: Diagnosis not present

## 2017-03-28 DIAGNOSIS — H04123 Dry eye syndrome of bilateral lacrimal glands: Secondary | ICD-10-CM | POA: Diagnosis not present

## 2017-03-28 DIAGNOSIS — H40003 Preglaucoma, unspecified, bilateral: Secondary | ICD-10-CM | POA: Diagnosis not present

## 2018-01-26 IMAGING — CR DG CHEST 1V
1 series · 1 of 1 positions shown · non-contrast
Comparison: None.

CLINICAL DATA: Recent fall today with left hip pain, initial
encounter

EXAM:
CHEST 1 VIEW

[t chest supine]
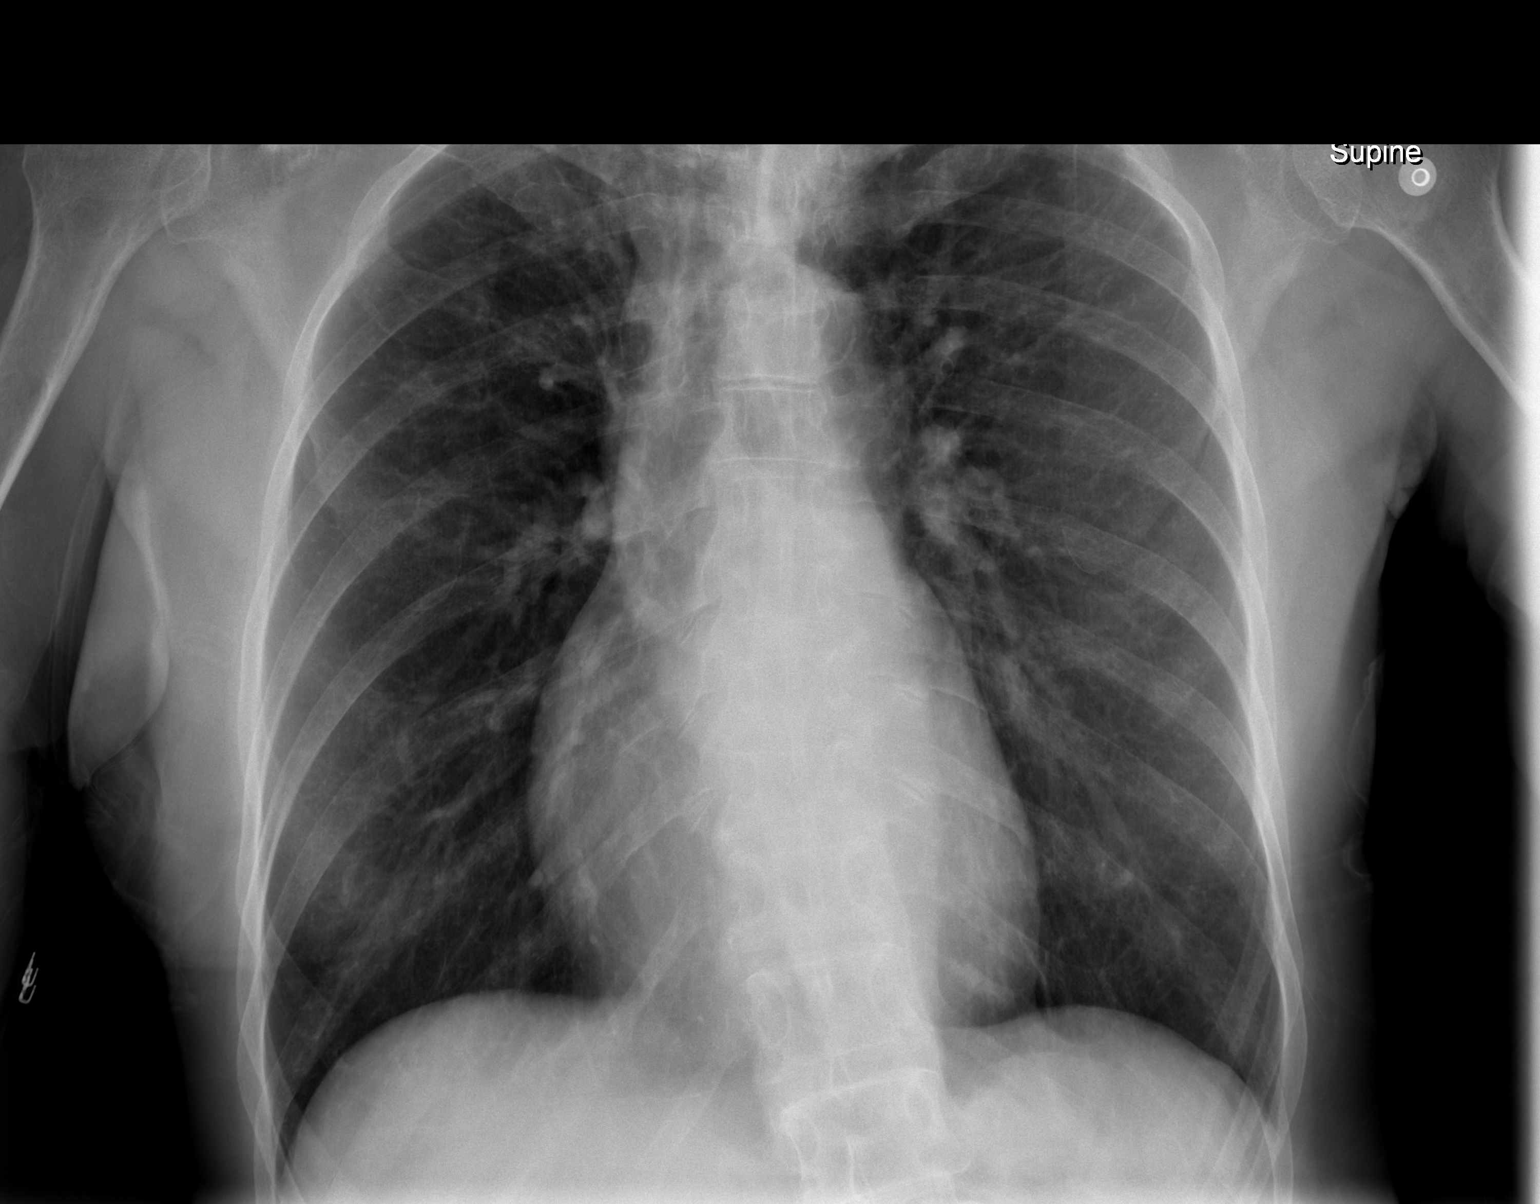

[1 of 1 positions shown; findings below may reference images not displayed]

FINDINGS: Cardiac shadow is within normal limits. The lungs are well aerated
bilaterally. Mild interstitial changes are seen without focal
infiltrate. No acute bony abnormality is seen.
IMPRESSION: Mild interstitial changes likely of a chronic nature. No acute
abnormality seen.

## 2018-01-26 IMAGING — CR DG FEMUR 1V*L*
2 series · 2 of 2 positions shown · non-contrast
Comparison: Left hip 12/25/2015

CLINICAL DATA: Fell with left hip pain.

EXAM:
LEFT FEMUR 1 VIEW

[t femur proximal ap left]
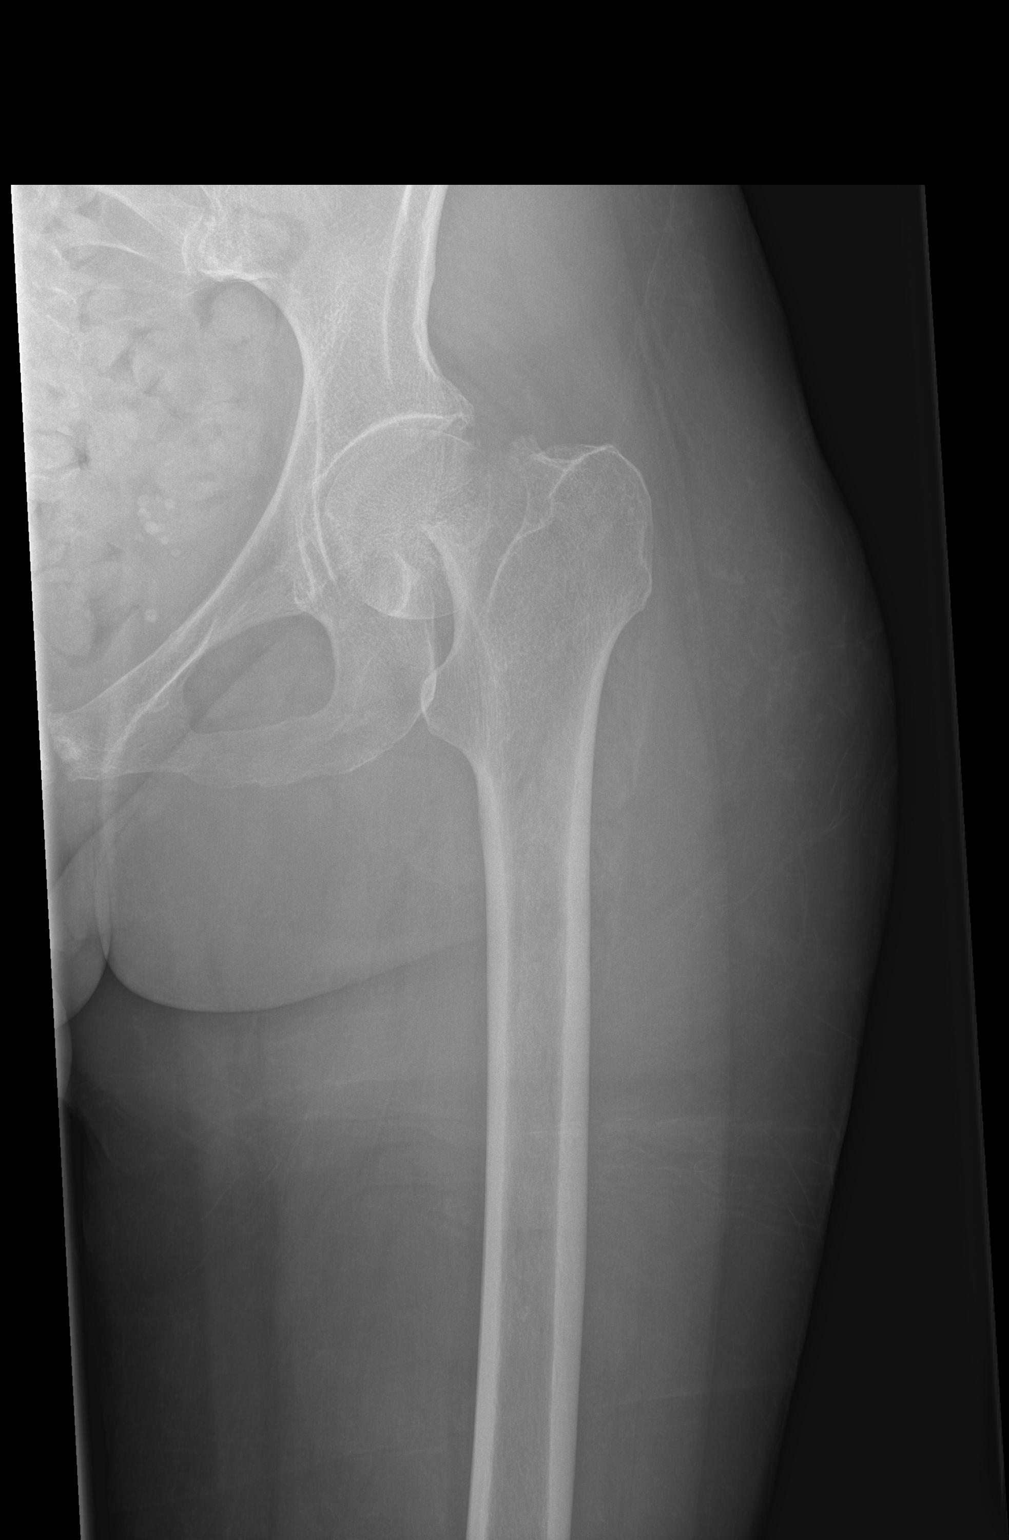

[t femur distal ap left]
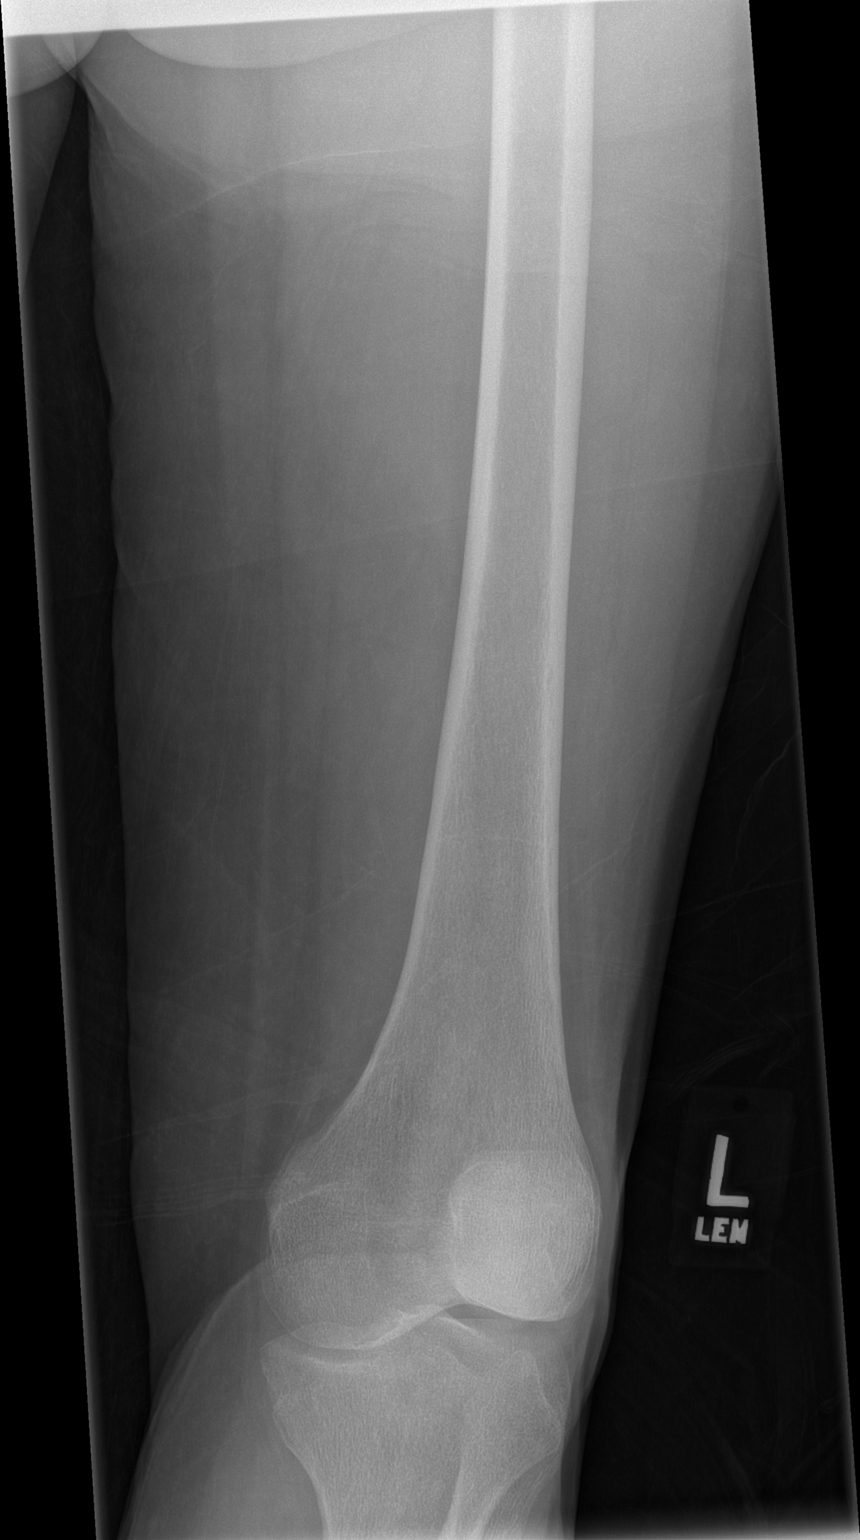

[2 of 2 positions shown; findings below may reference images not displayed]

FINDINGS: Single view of the left femur was obtained. There is a fracture
involving the proximal left femur at the junction of the femoral
head and neck. There is superior displacement of the left femoral
neck. Findings are suggestive for a subcapital hip fracture. The mid
and distal femur appear to be intact on this single view.
IMPRESSION: Fracture of the proximal left femur. Findings are compatible with a
subcapital hip fracture.

## 2018-01-26 IMAGING — CR DG LUMBAR SPINE COMPLETE 4+V
5 series · 5 of 5 positions shown · non-contrast
Comparison: None.

CLINICAL DATA: Fall today with low back pain, initial encounter

EXAM:
LUMBAR SPINE - COMPLETE 4+ VIEW

[t lumbar spine ap]
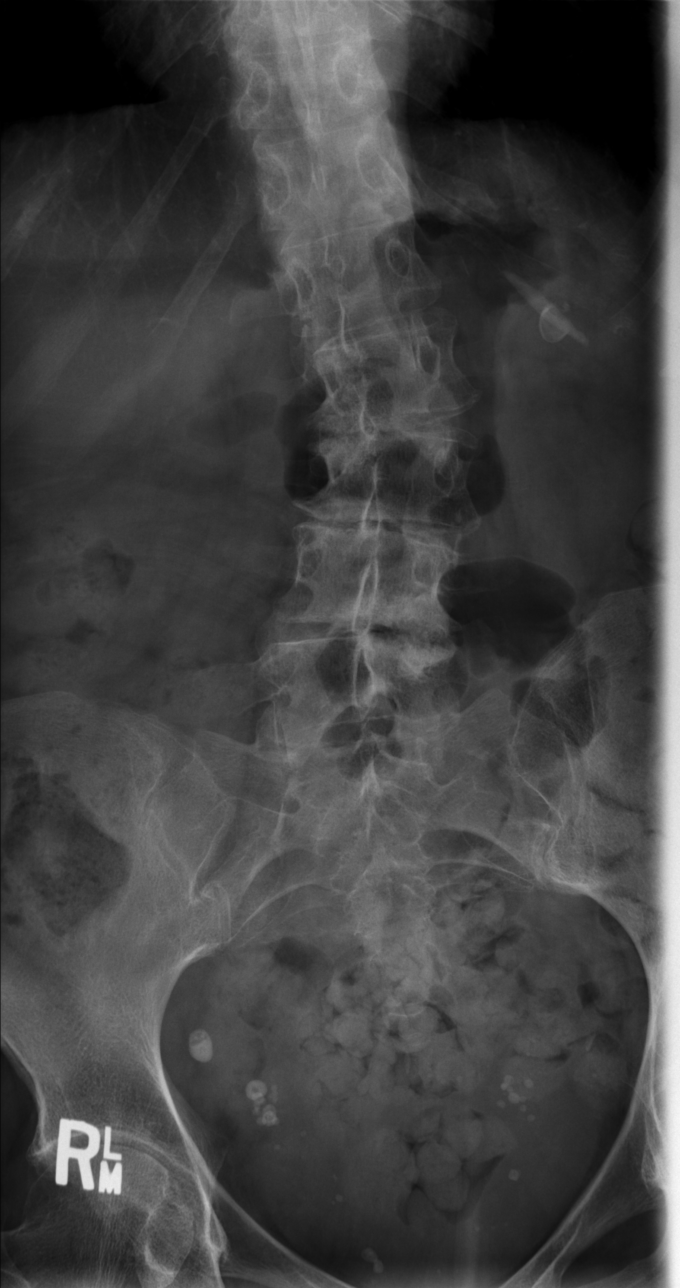

[t lumbar spine obl (1 of 2)]
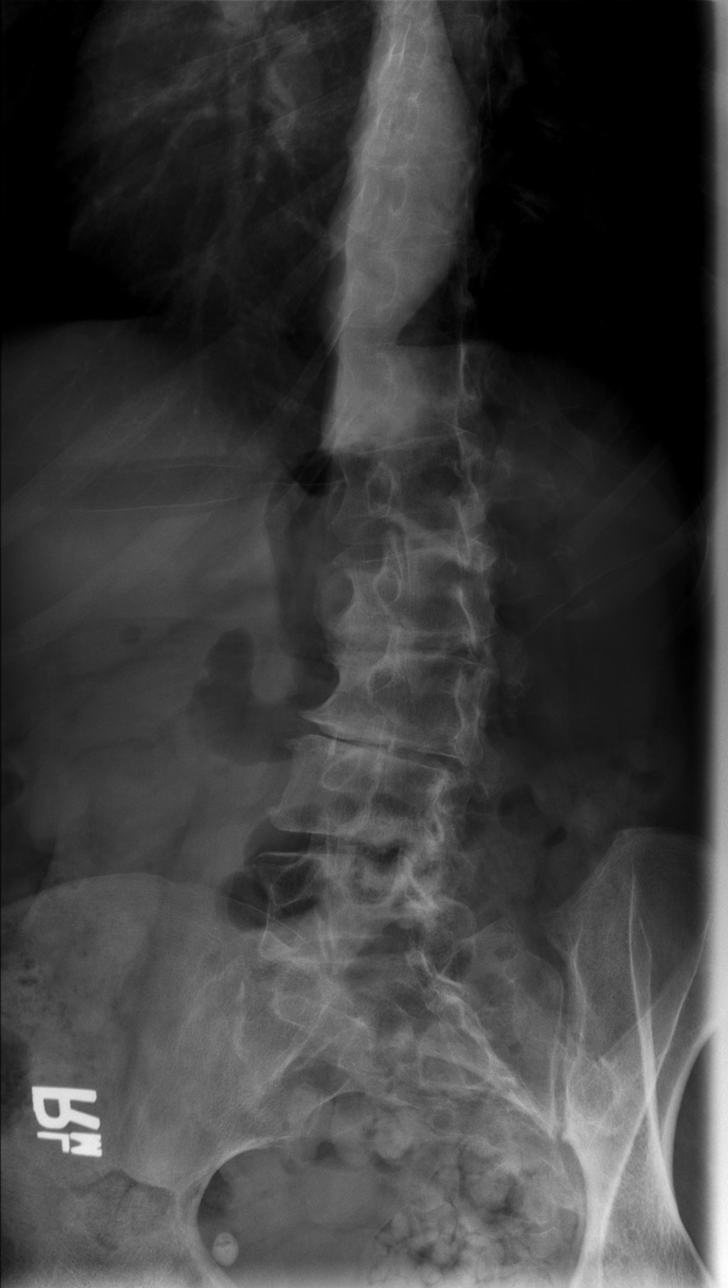

[t lumbar spine obl (2 of 2)]
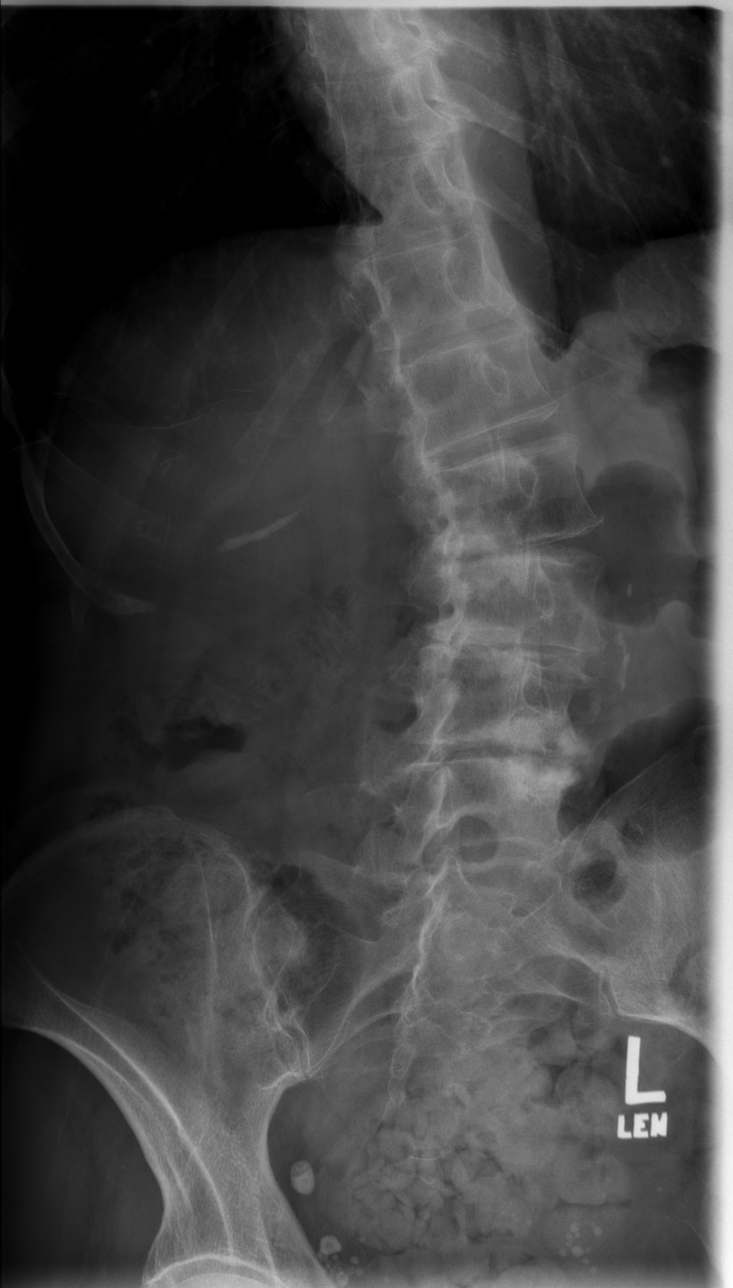

[w lumbar spine lat]
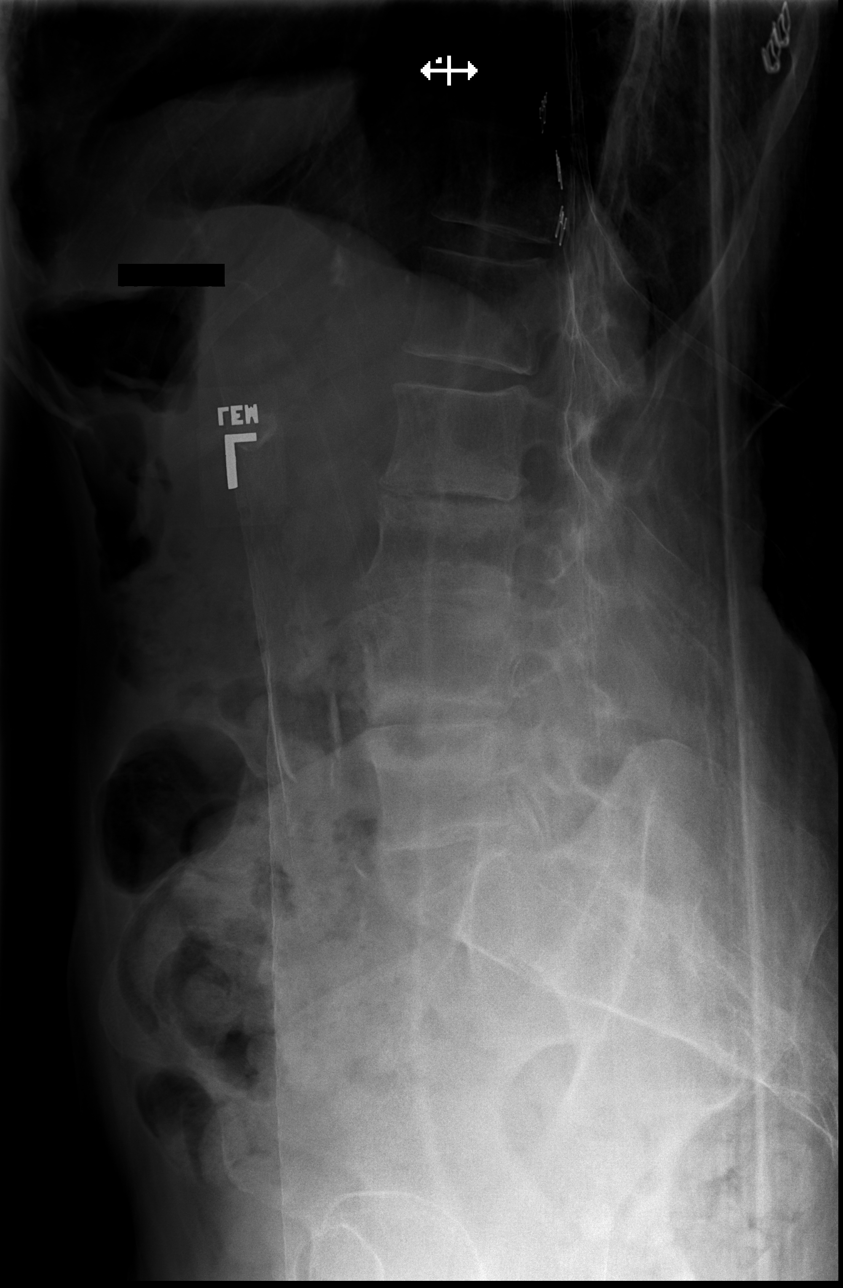

[w lumbar l-5 s-1 spot]
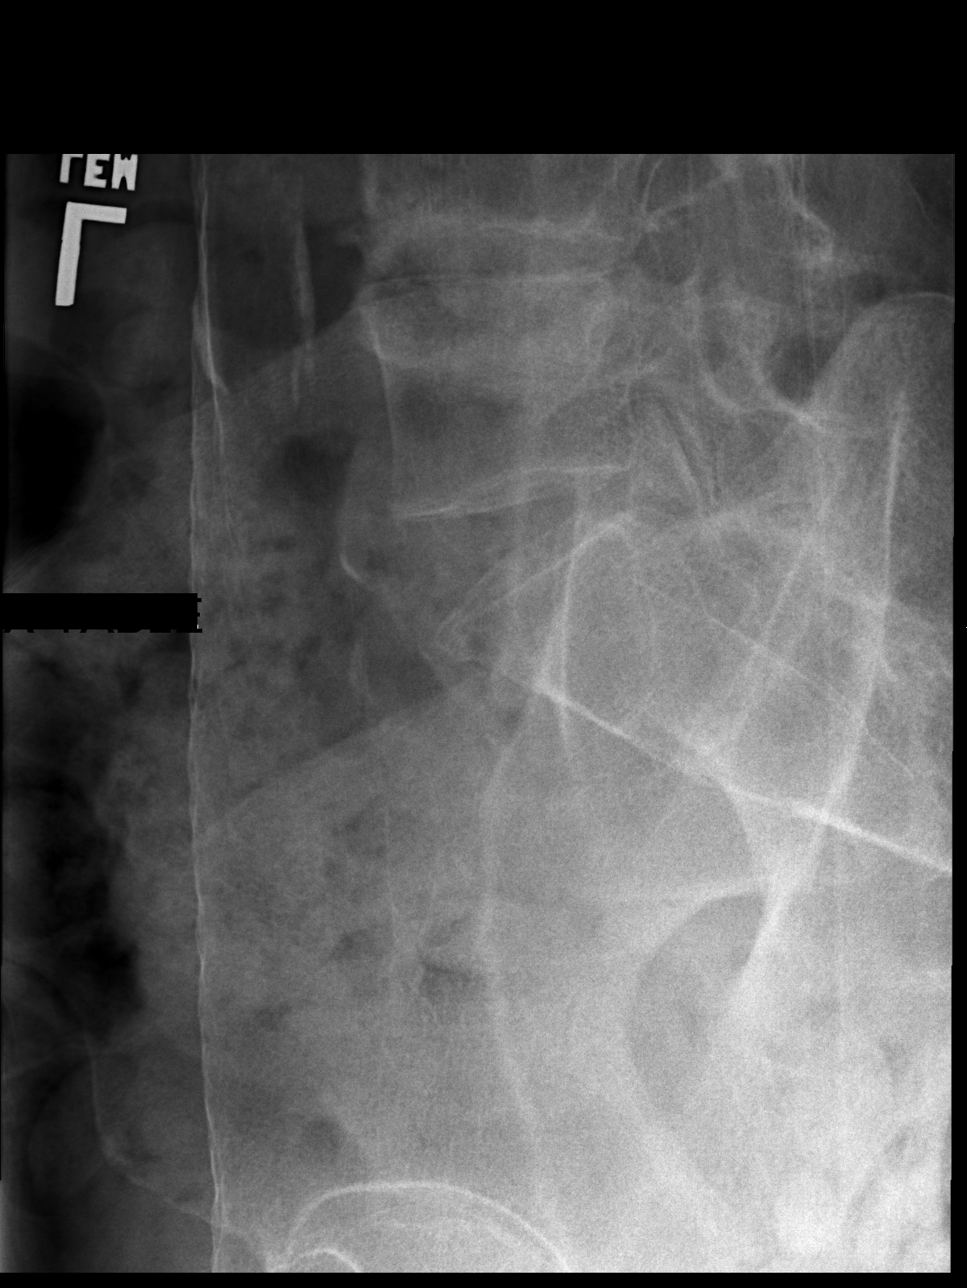

[5 of 5 positions shown; findings below may reference images not displayed]

FINDINGS: Five lumbar type vertebral bodies are well visualized. A mild
scoliosis concave to the right is noted. Reactive endplate changes
are noted. No anterolisthesis is seen. No soft tissue abnormality is
noted.
IMPRESSION: Degenerative change without acute abnormality.
